# Patient Record
Sex: Male | Born: 1935 | Race: White | Hispanic: No | Smoking: Former smoker
Health system: Southern US, Community
[De-identification: ages and names within clinical notes are randomized; demographics above are authoritative.]

## PROBLEM LIST (undated history)

## (undated) DIAGNOSIS — G4730 Sleep apnea, unspecified: Secondary | ICD-10-CM

## (undated) DIAGNOSIS — E669 Obesity, unspecified: Secondary | ICD-10-CM

## (undated) DIAGNOSIS — Z8619 Personal history of other infectious and parasitic diseases: Secondary | ICD-10-CM

## (undated) DIAGNOSIS — I10 Essential (primary) hypertension: Secondary | ICD-10-CM

## (undated) DIAGNOSIS — N289 Disorder of kidney and ureter, unspecified: Secondary | ICD-10-CM

## (undated) DIAGNOSIS — J189 Pneumonia, unspecified organism: Secondary | ICD-10-CM

## (undated) DIAGNOSIS — F419 Anxiety disorder, unspecified: Secondary | ICD-10-CM

## (undated) DIAGNOSIS — I714 Abdominal aortic aneurysm, without rupture, unspecified: Secondary | ICD-10-CM

## (undated) DIAGNOSIS — R51 Headache: Secondary | ICD-10-CM

## (undated) DIAGNOSIS — H269 Unspecified cataract: Secondary | ICD-10-CM

## (undated) DIAGNOSIS — Z8489 Family history of other specified conditions: Secondary | ICD-10-CM

## (undated) DIAGNOSIS — D62 Acute posthemorrhagic anemia: Secondary | ICD-10-CM

## (undated) DIAGNOSIS — I4891 Unspecified atrial fibrillation: Secondary | ICD-10-CM

## (undated) DIAGNOSIS — K219 Gastro-esophageal reflux disease without esophagitis: Secondary | ICD-10-CM

## (undated) DIAGNOSIS — I209 Angina pectoris, unspecified: Secondary | ICD-10-CM

## (undated) DIAGNOSIS — F40240 Claustrophobia: Secondary | ICD-10-CM

## (undated) DIAGNOSIS — IMO0002 Reserved for concepts with insufficient information to code with codable children: Secondary | ICD-10-CM

## (undated) HISTORY — PX: EYE SURGERY: SHX253

## (undated) HISTORY — PX: CATARACT EXTRACTION W/ INTRAOCULAR LENS IMPLANT: SHX1309

## (undated) HISTORY — PX: SHOULDER SURGERY: SHX246

## (undated) HISTORY — PX: BACK SURGERY: SHX140

---

## 2002-06-17 ENCOUNTER — Encounter: Payer: Self-pay | Admitting: Family Medicine

## 2002-06-17 ENCOUNTER — Encounter: Admission: RE | Admit: 2002-06-17 | Discharge: 2002-06-17 | Payer: Self-pay | Admitting: Family Medicine

## 2005-01-19 ENCOUNTER — Encounter: Admission: RE | Admit: 2005-01-19 | Discharge: 2005-01-19 | Payer: Self-pay | Admitting: Orthopedic Surgery

## 2005-01-20 ENCOUNTER — Ambulatory Visit (HOSPITAL_COMMUNITY): Admission: RE | Admit: 2005-01-20 | Discharge: 2005-01-20 | Payer: Self-pay | Admitting: Orthopedic Surgery

## 2005-01-20 ENCOUNTER — Ambulatory Visit (HOSPITAL_BASED_OUTPATIENT_CLINIC_OR_DEPARTMENT_OTHER): Admission: RE | Admit: 2005-01-20 | Discharge: 2005-01-20 | Payer: Self-pay | Admitting: Orthopedic Surgery

## 2005-01-31 ENCOUNTER — Encounter: Admission: RE | Admit: 2005-01-31 | Discharge: 2005-02-24 | Payer: Self-pay | Admitting: Orthopedic Surgery

## 2012-06-30 ENCOUNTER — Encounter (HOSPITAL_COMMUNITY): Payer: Self-pay | Admitting: *Deleted

## 2012-06-30 ENCOUNTER — Emergency Department (HOSPITAL_COMMUNITY)
Admission: EM | Admit: 2012-06-30 | Discharge: 2012-06-30 | Disposition: A | Discharge status: Home / Self Care | Payer: Medicare Other | Attending: Emergency Medicine | Admitting: Emergency Medicine

## 2012-06-30 DIAGNOSIS — K219 Gastro-esophageal reflux disease without esophagitis: Secondary | ICD-10-CM | POA: Insufficient documentation

## 2012-06-30 DIAGNOSIS — M545 Low back pain, unspecified: Secondary | ICD-10-CM | POA: Insufficient documentation

## 2012-06-30 DIAGNOSIS — I10 Essential (primary) hypertension: Secondary | ICD-10-CM | POA: Insufficient documentation

## 2012-06-30 DIAGNOSIS — F411 Generalized anxiety disorder: Secondary | ICD-10-CM | POA: Insufficient documentation

## 2012-06-30 DIAGNOSIS — M549 Dorsalgia, unspecified: Secondary | ICD-10-CM

## 2012-06-30 DIAGNOSIS — M79609 Pain in unspecified limb: Secondary | ICD-10-CM | POA: Insufficient documentation

## 2012-06-30 HISTORY — DX: Anxiety disorder, unspecified: F41.9

## 2012-06-30 HISTORY — DX: Gastro-esophageal reflux disease without esophagitis: K21.9

## 2012-06-30 HISTORY — DX: Essential (primary) hypertension: I10

## 2012-06-30 LAB — URINALYSIS, ROUTINE W REFLEX MICROSCOPIC
Glucose, UA: NEGATIVE mg/dL
Leukocytes, UA: NEGATIVE
Protein, ur: NEGATIVE mg/dL
Specific Gravity, Urine: 1.025 (ref 1.005–1.030)
pH: 7 (ref 5.0–8.0)

## 2012-06-30 LAB — BASIC METABOLIC PANEL
CO2: 31 mEq/L (ref 19–32)
Calcium: 8.5 mg/dL (ref 8.4–10.5)
Potassium: 3.9 mEq/L (ref 3.5–5.1)
Sodium: 138 mEq/L (ref 135–145)

## 2012-06-30 MED ORDER — OXYCODONE-ACETAMINOPHEN 5-325 MG PO TABS: ORAL_TABLET | ORAL | Status: DC

## 2012-06-30 MED ORDER — HYDROMORPHONE HCL PF 1 MG/ML IJ SOLN
1.0000 mg | Freq: Once | INTRAMUSCULAR | Status: AC
  Administered 2012-06-30: 1 mg via INTRAVENOUS
  Filled 2012-06-30: qty 1

## 2012-06-30 MED ORDER — DIAZEPAM 5 MG PO TABS
5.0000 mg | ORAL_TABLET | Freq: Once | ORAL | Status: AC
  Administered 2012-06-30: 5 mg via ORAL
  Filled 2012-06-30: qty 1

## 2012-06-30 MED ORDER — OXYCODONE-ACETAMINOPHEN 5-325 MG PO TABS
2.0000 | ORAL_TABLET | Freq: Once | ORAL | Status: AC
  Administered 2012-06-30: 2 via ORAL
  Filled 2012-06-30: qty 2

## 2012-06-30 MED ORDER — DIAZEPAM 5 MG PO TABS: ORAL_TABLET | ORAL | Status: DC

## 2012-06-30 MED ORDER — ONDANSETRON HCL 4 MG/2ML IJ SOLN
4.0000 mg | Freq: Once | INTRAMUSCULAR | Status: AC
  Administered 2012-06-30: 4 mg via INTRAVENOUS
  Filled 2012-06-30: qty 2

## 2012-06-30 MED ORDER — CYCLOBENZAPRINE HCL 10 MG PO TABS
10.0000 mg | ORAL_TABLET | Freq: Once | ORAL | Status: AC
  Administered 2012-06-30: 10 mg via ORAL
  Filled 2012-06-30: qty 1

## 2012-06-30 MED ORDER — MORPHINE SULFATE 4 MG/ML IJ SOLN
4.0000 mg | Freq: Once | INTRAMUSCULAR | Status: AC
  Administered 2012-06-30: 4 mg via INTRAVENOUS
  Filled 2012-06-30: qty 1

## 2012-06-30 NOTE — ED Notes (Signed)
ZOX:[id number]<AV> Expected date:06/30/12<BR> Expected time: 8:59 AM<BR> Means of arrival:Ambulance<BR> Comments:<BR> Back Pain

## 2012-06-30 NOTE — ED Notes (Signed)
RN to obtain labs with start of IV 

## 2012-06-30 NOTE — Discharge Instructions (Signed)
Back Pain, Adult Low back pain is very common. About 1 in 5 people have back pain.The cause of low back pain is rarely dangerous. The pain often gets better over time.About half of people with a sudden onset of back pain feel better in just 2 weeks. About 8 in 10 people feel better by 6 weeks.  CAUSES Some common causes of back pain include:  Strain of the muscles or ligaments supporting the spine.   Wear and tear (degeneration) of the spinal discs.   Arthritis.   Direct injury to the back.  DIAGNOSIS Most of the time, the direct cause of low back pain is not known.However, back pain can be treated effectively even when the exact cause of the pain is unknown.Answering your caregiver's questions about your overall health and symptoms is one of the most accurate ways to make sure the cause of your pain is not dangerous. If your caregiver needs more information, he or she may order lab work or imaging tests (X-rays or MRIs).However, even if imaging tests show changes in your back, this usually does not require surgery. HOME CARE INSTRUCTIONS For many people, back pain returns.Since low back pain is rarely dangerous, it is often a condition that people can learn to manageon their own.   Remain active. It is stressful on the back to sit or stand in one place. Do not sit, drive, or stand in one place for more than 30 minutes at a time. Take short walks on level surfaces as soon as pain allows.Try to increase the length of time you walk each day.   Do not stay in bed.Resting more than 1 or 2 days can delay your recovery.   Do not avoid exercise or work.Your body is made to move.It is not dangerous to be active, even though your back may hurt.Your back will likely heal faster if you return to being active before your pain is gone.   Pay attention to your body when you bend and lift. Many people have less discomfortwhen lifting if they bend their knees, keep the load close to their  bodies,and avoid twisting. Often, the most comfortable positions are those that put less stress on your recovering back.   Find a comfortable position to sleep. Use a firm mattress and lie on your side with your knees slightly bent. If you lie on your back, put a pillow under your knees.   Only take over-the-counter or prescription medicines as directed by your caregiver. Over-the-counter medicines to reduce pain and inflammation are often the most helpful.Your caregiver may prescribe muscle relaxant drugs.These medicines help dull your pain so you can more quickly return to your normal activities and healthy exercise.   Put ice on the injured area.   Put ice in a plastic bag.   Place a towel between your skin and the bag.   Leave the ice on for 15 to 20 minutes, 3 to 4 times a day for the first 2 to 3 days. After that, ice and heat may be alternated to reduce pain and spasms.   Ask your caregiver about trying back exercises and gentle massage. This may be of some benefit.   Avoid feeling anxious or stressed.Stress increases muscle tension and can worsen back pain.It is important to recognize when you are anxious or stressed and learn ways to manage it.Exercise is a great option.  SEEK MEDICAL CARE IF:  You have pain that is not relieved with rest or medicine.   You have   pain that does not improve in 1 week.   You have new symptoms.   You are generally not feeling well.  SEEK IMMEDIATE MEDICAL CARE IF:   You have pain that radiates from your back into your legs.   You develop new bowel or bladder control problems.   You have unusual weakness or numbness in your arms or legs.   You develop nausea or vomiting.   You develop abdominal pain.   You feel faint.  Document Released: 11/21/2005 Document Revised: 11/10/2011 Document Reviewed: 04/11/2011 [employer] Patient Information 2012 [employer]. 

## 2012-06-30 NOTE — ED Notes (Signed)
Patient brought by [facility] for lower back pain radiating to his left leg since Thursday

## 2012-07-01 NOTE — ED Provider Notes (Signed)
History     CSN: [id number]  Arrival date & time 06/30/12  0905   First MD Initiated Contact with Patient 06/30/12 [phone]      Chief Complaint  Patient presents with  . Back Pain    (Consider location/radiation/quality/duration/timing/severity/associated sxs/prior treatment) HPI Patient is a 78 year old male who presents today complaining of left lower back pain radiating all the way down the lateral side of his left leg to his foot. Patient required EMS transport his family could not move him due to his pain. Patient has been seen by his primary care physician for this. Pain began 4 days ago after the patient had played golf and then mowed on a riding mower all day. When he was seen by his primary care physician a plain film was performed and L5-S1 arthropathy was noted. Performance of an MRI was discussed but has not yet been scheduled. Patient was sent home with Vicodin but reports that this is then nothing for his pain. Additionally his pain has worsened. Patient has not had any weakness, numbness, or incontinence. He's had no new injuries and denies any urinary symptoms, nausea, vomiting, or fevers. Patient reports that the pain is sharp and a 10 out of 10. He has no history of cancer or heavy steroid use to suggest pathologic fracture. Additionally plain films are even performed. There are no other associated or modifying factors. Past Medical History  Diagnosis Date  . Hypertension   . Anxiety   . Acid reflux     History reviewed. No pertinent past surgical history.  History reviewed. No pertinent family history.  History  Substance Use Topics  . Smoking status: Never Smoker   . Smokeless tobacco: Not on file  . Alcohol Use: No      Review of Systems  Constitutional: Negative.   HENT: Negative.   Eyes: Negative.   Respiratory: Negative.   Cardiovascular: Negative.   Gastrointestinal: Negative.   Genitourinary: Negative.   Musculoskeletal: Positive for back pain.    Skin: Negative.   Neurological: Negative.   Hematological: Negative.   Psychiatric/Behavioral: Negative.   All other systems reviewed and are negative.    Allergies  Lisinopril  Home Medications   Current Outpatient Rx  Name Route Sig Dispense Refill  . ALPRAZOLAM 0.25 MG PO TABS Oral Take 0.25 mg by mouth 3 (three) times daily as needed. For anxiety    . AMLODIPINE BESYLATE 10 MG PO TABS Oral Take 10 mg by mouth daily.    Marland Kitchen VITAMIN C 1000 MG PO TABS Oral Take 1,000 mg by mouth daily.    . ASPIRIN EC 81 MG PO TBEC Oral Take 81 mg by mouth daily.    Marland Kitchen CETIRIZINE HCL 10 MG PO TABS Oral Take 10 mg by mouth daily.    Marland Kitchen VITAMIN D 1000 UNITS PO TABS Oral Take 2,000 Units by mouth daily.    Marland Kitchen ESOMEPRAZOLE MAGNESIUM 40 MG PO CPDR Oral Take 40 mg by mouth daily before breakfast.    . IBUPROFEN 100 MG PO TABS Oral Take 400 mg by mouth every 6 (six) hours as needed.    . IRBESARTAN-HYDROCHLOROTHIAZIDE 300-12.5 MG PO TABS Oral Take 1 tablet by mouth daily.    . NEBIVOLOL HCL 10 MG PO TABS Oral Take 10 mg by mouth daily.    Marland Kitchen PRESCRIPTION MEDICATION Nasal Place 2 sprays into the nose daily. Nasal spray    . DIAZEPAM 5 MG PO TABS  Take take 1-2 tablets by mouth every 8  hours when necessary for back spasm. 30 tablet 0  . OXYCODONE-ACETAMINOPHEN 5-325 MG PO TABS  Take 1-2 tablets by mouth every 4 hours as needed for back pain. 30 tablet 0    BP 152/71  Pulse 86  Temp 98.5 F (36.9 C) (Oral)  Resp 16  SpO2 95%  Physical Exam  Nursing note and vitals reviewed. GEN: Well-developed, well-nourished, elderly male in no distress HEENT: Atraumatic, normocephalic. Oropharynx clear without erythema EYES: PERRLA BL, no scleral icterus. NECK: Trachea midline, no meningismus CV: regular rate and rhythm. No murmurs, rubs, or gallops PULM: No respiratory distress.  No crackles, wheezes, or rales. GI: soft, non-tender. No guarding, rebound, or tenderness. + bowel sounds  GU: deferred Neuro: cranial  nerves grossly 2-12 intact, no abnormalities of strength or sensation, A and O x 3 MSK: Tenderness to palpation over the left sacroiliac area as well as the left paraspinal musculature. Pain is also noted by patient indication along the lateral side of the left leg into the left knee extending all the way down to the foot on the lateral portion of the leg. This is not reproducible on palpation. Knee has full range of motion with no ligamentous instability on the left. Left hip has full range of motion with no deformity or pain with or oral or manipulation. No numbness is noted over the extremity. Patient has 4/5 strength in the left lower shiny which is a function of pain as only certain motions are limited. Skin: No rashes petechiae, purpura, or jaundice Psych: no abnormality of mood   ED Course  Procedures (including critical care time)  Labs Reviewed  URINALYSIS, ROUTINE W REFLEX MICROSCOPIC - Abnormal; Notable for the following:    APPearance CLOUDY (*)     All other components within normal limits  BASIC METABOLIC PANEL - Abnormal; Notable for the following:    GFR calc non Af Amer 81 (*)     All other components within normal limits  LAB REPORT - SCANNED   No results found.   1. Back pain       MDM  Patient was evaluated by myself. Based on evaluation I did perform urinalysis as well as renal function to ensure that patient was not having any renal pathology relating to his pain. History was consistent with musculoskeletal strain as well as possible degenerative disease. I reviewed the report from outside imaging. This did indicate L5-S1 facet arthropathy. Patient was treated symptomatically here. Closed MRI was all that was available and patient reported being horribly claustrophobic. He had no emergent symptoms that would suggest an MRI must be performed today and patient preferred outpatient MRI. This was scheduled at the earliest available appointment which is Thursday, August 2.  Patient is to present at 8:15 for a scan scheduled for 8:45 PM. Patient was hypertensive upon arrival but had been told not to take any of his home medications. Following administration of his home medications as well as his first dose of medication blood pressure improved. Additionally family reports his blood pressure is always quite high. Patient required significant amounts of medication to control his symptoms. Eventually patient's pain was reasonably controlled with Percocet and Valium. Family and patient were both notified of fall risk with these medications. Patient was given a prescription for a walker to assist him with steadiness at this time. Family does not plan to leave the patient by himself at all during this period such strong medications would not have been utilized if patient had had  any degree of reasonable pain control with less potent medications. Patient has been seen by [facility] in the distant past for other issues. He was given referral information to both Dr. Newell Coral who is on call today as well as [facility] if patient prefers to have followup. Patient was discharged in good condition. This was after a lengthy discussion with family regarding ability for patient to function at home. They understood that if there were problems they can return to the emergency department for possible admission if outpatient management was unsuccessful. They were comfortable with plan and patient was discharged in the care of his family at home.        Cyndra Numbers, MD 07/01/12 [phone]

## 2012-07-02 ENCOUNTER — Ambulatory Visit
Admission: RE | Admit: 2012-07-02 | Discharge: 2012-07-02 | Disposition: A | Discharge status: Home / Self Care | Payer: Medicare Other | Source: Ambulatory Visit | Attending: Emergency Medicine | Admitting: Emergency Medicine

## 2012-07-02 ENCOUNTER — Other Ambulatory Visit: Payer: Self-pay | Admitting: Internal Medicine

## 2012-07-02 ENCOUNTER — Other Ambulatory Visit: Payer: Self-pay | Admitting: *Deleted

## 2012-07-02 ENCOUNTER — Other Ambulatory Visit: Payer: Self-pay | Admitting: Emergency Medicine

## 2012-07-02 DIAGNOSIS — Z77018 Contact with and (suspected) exposure to other hazardous metals: Secondary | ICD-10-CM

## 2012-07-02 DIAGNOSIS — M5430 Sciatica, unspecified side: Secondary | ICD-10-CM

## 2012-07-05 ENCOUNTER — Other Ambulatory Visit: Payer: Medicare Other

## 2012-07-13 ENCOUNTER — Other Ambulatory Visit: Payer: Self-pay | Admitting: Neurosurgery

## 2012-07-13 DIAGNOSIS — M545 Low back pain: Secondary | ICD-10-CM

## 2012-07-17 ENCOUNTER — Ambulatory Visit
Admission: RE | Admit: 2012-07-17 | Discharge: 2012-07-17 | Disposition: A | Discharge status: Home / Self Care | Payer: Medicare Other | Source: Ambulatory Visit | Attending: Neurosurgery | Admitting: Neurosurgery

## 2012-07-17 VITALS — BP 130/67 | HR 76

## 2012-07-17 DIAGNOSIS — M545 Low back pain: Secondary | ICD-10-CM

## 2012-07-17 MED ORDER — IOHEXOL 180 MG/ML  SOLN
15.0000 mL | Freq: Once | INTRAMUSCULAR | Status: AC | PRN
  Administered 2012-07-17: 15 mL via INTRATHECAL

## 2012-07-17 MED ORDER — HYDROMORPHONE HCL PF 2 MG/ML IJ SOLN
2.0000 mg | Freq: Once | INTRAMUSCULAR | Status: AC
  Administered 2012-07-17: 2 mg via INTRAMUSCULAR

## 2012-07-17 MED ORDER — ONDANSETRON HCL 4 MG/2ML IJ SOLN
4.0000 mg | Freq: Once | INTRAMUSCULAR | Status: AC
  Administered 2012-07-17: 4 mg via INTRAMUSCULAR

## 2012-07-17 MED ORDER — DIAZEPAM 5 MG PO TABS
5.0000 mg | ORAL_TABLET | Freq: Once | ORAL | Status: AC
  Administered 2012-07-17: 5 mg via ORAL

## 2012-07-17 NOTE — Progress Notes (Signed)
Pt c/o of left leg pain post standing x-rays and pain meds given.

## 2012-07-18 ENCOUNTER — Other Ambulatory Visit: Payer: Self-pay | Admitting: Neurosurgery

## 2012-07-18 ENCOUNTER — Encounter (HOSPITAL_COMMUNITY): Payer: Self-pay | Admitting: Pharmacy Technician

## 2012-07-19 ENCOUNTER — Ambulatory Visit (HOSPITAL_COMMUNITY)
Admission: RE | Admit: 2012-07-19 | Discharge: 2012-07-19 | Disposition: A | Discharge status: Home / Self Care | Payer: Medicare Other | Source: Ambulatory Visit | Attending: Neurosurgery | Admitting: Neurosurgery

## 2012-07-19 ENCOUNTER — Encounter (HOSPITAL_COMMUNITY)
Admission: RE | Admit: 2012-07-19 | Discharge: 2012-07-19 | Disposition: A | Discharge status: Home / Self Care | Payer: Medicare Other | Source: Ambulatory Visit | Attending: Neurosurgery | Admitting: Neurosurgery

## 2012-07-19 ENCOUNTER — Encounter (HOSPITAL_COMMUNITY): Payer: Self-pay

## 2012-07-19 ENCOUNTER — Other Ambulatory Visit (HOSPITAL_COMMUNITY): Payer: Self-pay | Admitting: Neurosurgery

## 2012-07-19 DIAGNOSIS — I10 Essential (primary) hypertension: Secondary | ICD-10-CM | POA: Insufficient documentation

## 2012-07-19 DIAGNOSIS — R634 Abnormal weight loss: Secondary | ICD-10-CM | POA: Insufficient documentation

## 2012-07-19 DIAGNOSIS — Z01818 Encounter for other preprocedural examination: Secondary | ICD-10-CM

## 2012-07-19 DIAGNOSIS — Z01812 Encounter for preprocedural laboratory examination: Secondary | ICD-10-CM | POA: Insufficient documentation

## 2012-07-19 HISTORY — DX: Pneumonia, unspecified organism: J18.9

## 2012-07-19 HISTORY — DX: Abdominal aortic aneurysm, without rupture, unspecified: I71.40

## 2012-07-19 HISTORY — DX: Claustrophobia: F40.240

## 2012-07-19 HISTORY — DX: Reserved for concepts with insufficient information to code with codable children: IMO0002

## 2012-07-19 HISTORY — DX: Personal history of other infectious and parasitic diseases: Z86.19

## 2012-07-19 HISTORY — DX: Headache: R51

## 2012-07-19 HISTORY — DX: Family history of other specified conditions: Z84.89

## 2012-07-19 HISTORY — DX: Unspecified cataract: H26.9

## 2012-07-19 HISTORY — DX: Abdominal aortic aneurysm, without rupture: I71.4

## 2012-07-19 HISTORY — DX: Sleep apnea, unspecified: G47.30

## 2012-07-19 LAB — CBC
MCV: 87.3 fL (ref 78.0–100.0)
Platelets: 328 10*3/uL (ref 150–400)
RBC: 4.8 MIL/uL (ref 4.22–5.81)
RDW: 12.7 % (ref 11.5–15.5)
WBC: 6.6 10*3/uL (ref 4.0–10.5)

## 2012-07-19 LAB — BASIC METABOLIC PANEL
Calcium: 9.1 mg/dL (ref 8.4–10.5)
Creatinine, Ser: 0.99 mg/dL (ref 0.50–1.35)
GFR calc Af Amer: 90 mL/min — ABNORMAL LOW (ref >90)
GFR calc non Af Amer: 78 mL/min — ABNORMAL LOW (ref >90)
Sodium: 137 mEq/L (ref 135–145)

## 2012-07-19 LAB — SURGICAL PCR SCREEN: MRSA, PCR: NEGATIVE

## 2012-07-19 MED ORDER — CEFAZOLIN SODIUM-DEXTROSE 2-3 GM-% IV SOLR
2.0000 g | INTRAVENOUS | Status: AC
  Administered 2012-07-20: 2 g via INTRAVENOUS
  Filled 2012-07-19: qty 50

## 2012-07-19 MED ORDER — DEXAMETHASONE SODIUM PHOSPHATE 10 MG/ML IJ SOLN
10.0000 mg | INTRAMUSCULAR | Status: DC
  Filled 2012-07-19: qty 1

## 2012-07-19 NOTE — Progress Notes (Signed)
Contacted Dr. Sampson Goon regarding EKG from 12/01/11.  Reviewed results.  EKG ok per Dr. Sampson Goon

## 2012-07-19 NOTE — Progress Notes (Signed)
Contacted Dr. Dimas Millin office [phone], spoke with [facility], requested most recent EKG and CXR.

## 2012-07-19 NOTE — Pre-Procedure Instructions (Signed)
22 Antonio Bernard  07/19/2012   Your procedure is scheduled on:  Friday July 20, 2012  Report to [facility] at 10:00 AM.  Call this number if you have problems the morning of surgery: [phone]   Remember:   Do not eat food or drinkAfter Midnight.      Take these medicines the morning of surgery with A SIP OF WATER: xanax, amlodipine, nexium, flonase, bystolic, percocet   Do not wear jewelry, make-up or nail polish.  Do not wear lotions, powders, or perfumes. You may wear deodorant.  Do not shave 48 hours prior to surgery. Men may shave face and neck.  Do not bring valuables to the hospital.  Contacts, dentures or bridgework may not be worn into surgery.  Leave suitcase in the car. After surgery it may be brought to your room.  For patients admitted to the hospital, checkout time is 11:00 AM the day of discharge.   Patients discharged the day of surgery will not be allowed to drive home.  Name and phone number of your driver: Marlou Starks [phone]  Special Instructions: CHG Shower Use Special Wash: 1/2 bottle night before surgery and 1/2 bottle morning of surgery.   Please read over the following fact sheets that you were given: Pain Booklet, Coughing and Deep Breathing, MRSA Information and Surgical Site Infection Prevention

## 2012-07-20 ENCOUNTER — Ambulatory Visit (HOSPITAL_COMMUNITY)
Admission: RE | Admit: 2012-07-20 | Discharge: 2012-07-21 | Disposition: A | Discharge status: Home / Self Care | Payer: Medicare Other | Source: Ambulatory Visit | Attending: Neurosurgery | Admitting: Neurosurgery

## 2012-07-20 ENCOUNTER — Encounter (HOSPITAL_COMMUNITY)
Admission: RE | Disposition: A | Discharge status: Home / Self Care | Payer: Self-pay | Source: Ambulatory Visit | Attending: Neurosurgery

## 2012-07-20 ENCOUNTER — Inpatient Hospital Stay (HOSPITAL_COMMUNITY): Payer: Medicare Other | Admitting: Anesthesiology

## 2012-07-20 ENCOUNTER — Encounter (HOSPITAL_COMMUNITY): Payer: Self-pay | Admitting: Anesthesiology

## 2012-07-20 ENCOUNTER — Inpatient Hospital Stay (HOSPITAL_COMMUNITY): Payer: Medicare Other

## 2012-07-20 ENCOUNTER — Encounter (HOSPITAL_COMMUNITY): Payer: Self-pay | Admitting: *Deleted

## 2012-07-20 ENCOUNTER — Encounter (HOSPITAL_COMMUNITY): Payer: Self-pay | Admitting: Vascular Surgery

## 2012-07-20 DIAGNOSIS — M5116 Intervertebral disc disorders with radiculopathy, lumbar region: Secondary | ICD-10-CM

## 2012-07-20 DIAGNOSIS — Z01818 Encounter for other preprocedural examination: Secondary | ICD-10-CM | POA: Insufficient documentation

## 2012-07-20 DIAGNOSIS — F411 Generalized anxiety disorder: Secondary | ICD-10-CM | POA: Insufficient documentation

## 2012-07-20 DIAGNOSIS — Z01812 Encounter for preprocedural laboratory examination: Secondary | ICD-10-CM | POA: Insufficient documentation

## 2012-07-20 DIAGNOSIS — I10 Essential (primary) hypertension: Secondary | ICD-10-CM | POA: Insufficient documentation

## 2012-07-20 DIAGNOSIS — M5126 Other intervertebral disc displacement, lumbar region: Principal | ICD-10-CM | POA: Insufficient documentation

## 2012-07-20 DIAGNOSIS — G4730 Sleep apnea, unspecified: Secondary | ICD-10-CM | POA: Insufficient documentation

## 2012-07-20 DIAGNOSIS — J701 Chronic and other pulmonary manifestations due to radiation: Secondary | ICD-10-CM | POA: Insufficient documentation

## 2012-07-20 HISTORY — PX: LUMBAR LAMINECTOMY/DECOMPRESSION MICRODISCECTOMY: SHX5026

## 2012-07-20 SURGERY — LUMBAR LAMINECTOMY/DECOMPRESSION MICRODISCECTOMY 1 LEVEL
Anesthesia: General | Site: Back | Wound class: Clean

## 2012-07-20 MED ORDER — DEXTROSE 5 % IV SOLN
INTRAVENOUS | Status: DC | PRN
  Administered 2012-07-20: 11:00:00 via INTRAVENOUS

## 2012-07-20 MED ORDER — CEFAZOLIN SODIUM 1-5 GM-% IV SOLN
1.0000 g | Freq: Three times a day (TID) | INTRAVENOUS | Status: AC
  Administered 2012-07-20 – 2012-07-21 (×2): 1 g via INTRAVENOUS
  Filled 2012-07-20 (×2): qty 50

## 2012-07-20 MED ORDER — PROPOFOL 10 MG/ML IV EMUL
INTRAVENOUS | Status: DC | PRN
  Administered 2012-07-20: 200 mg via INTRAVENOUS

## 2012-07-20 MED ORDER — PHENOL 1.4 % MT LIQD: 1.0000 | OROMUCOSAL | Status: DC | PRN

## 2012-07-20 MED ORDER — ALPRAZOLAM 0.5 MG PO TABS
0.5000 mg | ORAL_TABLET | Freq: Every evening | ORAL | Status: DC | PRN
  Administered 2012-07-20: 0.5 mg via ORAL
  Filled 2012-07-20: qty 1

## 2012-07-20 MED ORDER — THROMBIN 5000 UNITS EX KIT
PACK | CUTANEOUS | Status: DC | PRN
  Administered 2012-07-20 (×2): 5000 [IU] via TOPICAL

## 2012-07-20 MED ORDER — KETOROLAC TROMETHAMINE 15 MG/ML IJ SOLN
INTRAMUSCULAR | Status: DC | PRN
  Administered 2012-07-20: 15 mg via INTRAVENOUS

## 2012-07-20 MED ORDER — ONDANSETRON HCL 4 MG/2ML IJ SOLN: 4.0000 mg | Freq: Once | INTRAMUSCULAR | Status: DC | PRN

## 2012-07-20 MED ORDER — DEXAMETHASONE SODIUM PHOSPHATE 4 MG/ML IJ SOLN
INTRAMUSCULAR | Status: DC | PRN
  Administered 2012-07-20: 12 mg via INTRAVENOUS

## 2012-07-20 MED ORDER — CYCLOBENZAPRINE HCL 10 MG PO TABS
10.0000 mg | ORAL_TABLET | Freq: Three times a day (TID) | ORAL | Status: DC | PRN
  Administered 2012-07-21: 10 mg via ORAL
  Filled 2012-07-20: qty 1

## 2012-07-20 MED ORDER — IRBESARTAN-HYDROCHLOROTHIAZIDE 300-12.5 MG PO TABS: 1.0000 | ORAL_TABLET | Freq: Every day | ORAL | Status: DC

## 2012-07-20 MED ORDER — ALPRAZOLAM 0.25 MG PO TABS: 0.2500 mg | ORAL_TABLET | Freq: Three times a day (TID) | ORAL | Status: DC | PRN

## 2012-07-20 MED ORDER — BUPIVACAINE HCL (PF) 0.5 % IJ SOLN
INTRAMUSCULAR | Status: DC | PRN
  Administered 2012-07-20: 20 mL

## 2012-07-20 MED ORDER — ACETAMINOPHEN 325 MG PO TABS: 650.0000 mg | ORAL_TABLET | ORAL | Status: DC | PRN

## 2012-07-20 MED ORDER — HYDROCHLOROTHIAZIDE 12.5 MG PO CAPS
12.5000 mg | ORAL_CAPSULE | Freq: Every day | ORAL | Status: DC
  Filled 2012-07-20 (×2): qty 1

## 2012-07-20 MED ORDER — HYDROMORPHONE HCL PF 1 MG/ML IJ SOLN
INTRAMUSCULAR | Status: AC
  Filled 2012-07-20: qty 1

## 2012-07-20 MED ORDER — SODIUM CHLORIDE 0.9 % IJ SOLN
3.0000 mL | Freq: Two times a day (BID) | INTRAMUSCULAR | Status: DC
  Administered 2012-07-20: 3 mL via INTRAVENOUS

## 2012-07-20 MED ORDER — DEXAMETHASONE 4 MG PO TABS
4.0000 mg | ORAL_TABLET | Freq: Four times a day (QID) | ORAL | Status: AC
  Administered 2012-07-20: 4 mg via ORAL
  Filled 2012-07-20: qty 1

## 2012-07-20 MED ORDER — ROCURONIUM BROMIDE 100 MG/10ML IV SOLN
INTRAVENOUS | Status: DC | PRN
  Administered 2012-07-20: 50 mg via INTRAVENOUS
  Administered 2012-07-20: 20 mg via INTRAVENOUS

## 2012-07-20 MED ORDER — AMLODIPINE BESYLATE 10 MG PO TABS
10.0000 mg | ORAL_TABLET | Freq: Every day | ORAL | Status: DC
  Filled 2012-07-20: qty 1

## 2012-07-20 MED ORDER — ONDANSETRON HCL 4 MG/2ML IJ SOLN
INTRAMUSCULAR | Status: DC | PRN
  Administered 2012-07-20: 4 mg via INTRAVENOUS

## 2012-07-20 MED ORDER — HYDROMORPHONE HCL PF 1 MG/ML IJ SOLN: 1.0000 mg | INTRAMUSCULAR | Status: DC | PRN

## 2012-07-20 MED ORDER — MUPIROCIN 2 % EX OINT
TOPICAL_OINTMENT | Freq: Once | CUTANEOUS | Status: AC
  Administered 2012-07-20: 10:00:00 via NASAL

## 2012-07-20 MED ORDER — THROMBIN 5000 UNITS EX SOLR: CUTANEOUS | Status: DC | PRN

## 2012-07-20 MED ORDER — DEXAMETHASONE SODIUM PHOSPHATE 4 MG/ML IJ SOLN
4.0000 mg | Freq: Four times a day (QID) | INTRAMUSCULAR | Status: AC
  Administered 2012-07-21: 4 mg via INTRAVENOUS
  Filled 2012-07-20: qty 1

## 2012-07-20 MED ORDER — FENTANYL CITRATE 0.05 MG/ML IJ SOLN
INTRAMUSCULAR | Status: DC | PRN
  Administered 2012-07-20 (×2): 100 ug via INTRAVENOUS
  Administered 2012-07-20: 50 ug via INTRAVENOUS

## 2012-07-20 MED ORDER — ACETAMINOPHEN 10 MG/ML IV SOLN
INTRAVENOUS | Status: DC | PRN
  Administered 2012-07-20: 1000 mg via INTRAVENOUS

## 2012-07-20 MED ORDER — SODIUM CHLORIDE 0.9 % IR SOLN
Status: DC | PRN
  Administered 2012-07-20: 12:00:00

## 2012-07-20 MED ORDER — SODIUM CHLORIDE 0.9 % IJ SOLN: 3.0000 mL | INTRAMUSCULAR | Status: DC | PRN

## 2012-07-20 MED ORDER — HYDROCODONE-ACETAMINOPHEN 5-325 MG PO TABS
1.0000 | ORAL_TABLET | ORAL | Status: DC | PRN
  Administered 2012-07-20 – 2012-07-21 (×4): 1 via ORAL
  Filled 2012-07-20 (×4): qty 1

## 2012-07-20 MED ORDER — LACTATED RINGERS IV SOLN
INTRAVENOUS | Status: DC | PRN
  Administered 2012-07-20 (×2): via INTRAVENOUS

## 2012-07-20 MED ORDER — EPHEDRINE SULFATE 50 MG/ML IJ SOLN
INTRAMUSCULAR | Status: DC | PRN
  Administered 2012-07-20: 10 mg via INTRAVENOUS

## 2012-07-20 MED ORDER — ACETAMINOPHEN 10 MG/ML IV SOLN
INTRAVENOUS | Status: AC
  Filled 2012-07-20: qty 100

## 2012-07-20 MED ORDER — IRBESARTAN 300 MG PO TABS
300.0000 mg | ORAL_TABLET | Freq: Every day | ORAL | Status: DC
  Filled 2012-07-20 (×2): qty 1

## 2012-07-20 MED ORDER — NEBIVOLOL HCL 10 MG PO TABS
10.0000 mg | ORAL_TABLET | Freq: Every day | ORAL | Status: DC
  Filled 2012-07-20: qty 1

## 2012-07-20 MED ORDER — HEMOSTATIC AGENTS (NO CHARGE) OPTIME
TOPICAL | Status: DC | PRN
  Administered 2012-07-20: 1 via TOPICAL

## 2012-07-20 MED ORDER — ACETAMINOPHEN 650 MG RE SUPP: 650.0000 mg | RECTAL | Status: DC | PRN

## 2012-07-20 MED ORDER — MUPIROCIN 2 % EX OINT
TOPICAL_OINTMENT | CUTANEOUS | Status: AC
  Filled 2012-07-20: qty 22

## 2012-07-20 MED ORDER — GLYCOPYRROLATE 0.2 MG/ML IJ SOLN
INTRAMUSCULAR | Status: DC | PRN
  Administered 2012-07-20: 0.4 mg via INTRAVENOUS

## 2012-07-20 MED ORDER — KETOROLAC TROMETHAMINE 30 MG/ML IJ SOLN
15.0000 mg | Freq: Four times a day (QID) | INTRAMUSCULAR | Status: DC
  Administered 2012-07-20: 19:00:00 via INTRAVENOUS
  Administered 2012-07-21 (×2): 15 mg via INTRAVENOUS
  Filled 2012-07-20 (×7): qty 1

## 2012-07-20 MED ORDER — MIDAZOLAM HCL 5 MG/5ML IJ SOLN
INTRAMUSCULAR | Status: DC | PRN
  Administered 2012-07-20: 1 mg via INTRAVENOUS

## 2012-07-20 MED ORDER — KCL IN DEXTROSE-NACL 20-5-0.45 MEQ/L-%-% IV SOLN
80.0000 mL/h | INTRAVENOUS | Status: DC
  Filled 2012-07-20 (×3): qty 1000

## 2012-07-20 MED ORDER — HYDROMORPHONE HCL PF 1 MG/ML IJ SOLN
0.2500 mg | INTRAMUSCULAR | Status: DC | PRN
  Administered 2012-07-20 (×2): 0.5 mg via INTRAVENOUS

## 2012-07-20 MED ORDER — NEOSTIGMINE METHYLSULFATE 1 MG/ML IJ SOLN
INTRAMUSCULAR | Status: DC | PRN
  Administered 2012-07-20: 3 mg via INTRAVENOUS

## 2012-07-20 MED ORDER — ONDANSETRON HCL 4 MG/2ML IJ SOLN: 4.0000 mg | INTRAMUSCULAR | Status: DC | PRN

## 2012-07-20 MED ORDER — LIDOCAINE HCL (CARDIAC) 20 MG/ML IV SOLN
INTRAVENOUS | Status: DC | PRN
  Administered 2012-07-20: 80 mg via INTRAVENOUS

## 2012-07-20 MED ORDER — MENTHOL 3 MG MT LOZG: 1.0000 | LOZENGE | OROMUCOSAL | Status: DC | PRN

## 2012-07-20 MED ORDER — 0.9 % SODIUM CHLORIDE (POUR BTL) OPTIME
TOPICAL | Status: DC | PRN
  Administered 2012-07-20: 1000 mL

## 2012-07-20 MED ORDER — SODIUM CHLORIDE 0.9 % IV SOLN
INTRAVENOUS | Status: AC
  Filled 2012-07-20: qty 500

## 2012-07-20 MED ORDER — BACITRACIN 50000 UNITS IM SOLR
INTRAMUSCULAR | Status: AC
  Filled 2012-07-20: qty 1

## 2012-07-20 MED ORDER — SODIUM CHLORIDE 0.9 % IV SOLN: 250.0000 mL | INTRAVENOUS | Status: DC

## 2012-07-20 SURGICAL SUPPLY — 49 items
ADH SKN CLS APL DERMABOND .7 (GAUZE/BANDAGES/DRESSINGS) ×1
APL SKNCLS STERI-STRIP NONHPOA (GAUZE/BANDAGES/DRESSINGS) ×2
BAG DECANTER FOR FLEXI CONT (MISCELLANEOUS) ×2 IMPLANT
BENZOIN TINCTURE PRP APPL 2/3 (GAUZE/BANDAGES/DRESSINGS) ×4 IMPLANT
BLADE SURG ROTATE 9660 (MISCELLANEOUS) ×2 IMPLANT
BRUSH SCRUB EZ PLAIN DRY (MISCELLANEOUS) ×2 IMPLANT
CANISTER SUCTION 2500CC (MISCELLANEOUS) ×2 IMPLANT
CLOTH BEACON ORANGE TIMEOUT ST (SAFETY) ×2 IMPLANT
CONT SPEC 4OZ CLIKSEAL STRL BL (MISCELLANEOUS) ×2 IMPLANT
DERMABOND ADVANCED (GAUZE/BANDAGES/DRESSINGS) ×1
DERMABOND ADVANCED .7 DNX12 (GAUZE/BANDAGES/DRESSINGS) ×1 IMPLANT
DRAPE LAPAROTOMY 100X72X124 (DRAPES) ×2 IMPLANT
DRAPE MICROSCOPE ZEISS OPMI (DRAPES) ×2 IMPLANT
DRAPE SURG 17X23 STRL (DRAPES) ×4 IMPLANT
DRESSING TELFA 8X3 (GAUZE/BANDAGES/DRESSINGS) ×2 IMPLANT
ELECT REM PT RETURN 9FT ADLT (ELECTROSURGICAL) ×2
ELECTRODE REM PT RTRN 9FT ADLT (ELECTROSURGICAL) ×1 IMPLANT
GAUZE SPONGE 4X4 16PLY XRAY LF (GAUZE/BANDAGES/DRESSINGS) IMPLANT
GLOVE BIOGEL PI IND STRL 7.0 (GLOVE) IMPLANT
GLOVE BIOGEL PI IND STRL 7.5 (GLOVE) IMPLANT
GLOVE BIOGEL PI INDICATOR 7.0 (GLOVE) ×2
GLOVE BIOGEL PI INDICATOR 7.5 (GLOVE) ×1
GLOVE ECLIPSE 7.5 STRL STRAW (GLOVE) ×2 IMPLANT
GLOVE SS BIOGEL STRL SZ 6.5 (GLOVE) IMPLANT
GLOVE SUPERSENSE BIOGEL SZ 6.5 (GLOVE) ×3
GLOVE SURG SS PI 7.0 STRL IVOR (GLOVE) ×2 IMPLANT
GOWN BRE IMP SLV AUR LG STRL (GOWN DISPOSABLE) IMPLANT
GOWN BRE IMP SLV AUR XL STRL (GOWN DISPOSABLE) ×2 IMPLANT
GOWN STRL REIN 2XL LVL4 (GOWN DISPOSABLE) IMPLANT
KIT BASIN OR (CUSTOM PROCEDURE TRAY) ×2 IMPLANT
KIT ROOM TURNOVER OR (KITS) ×2 IMPLANT
NDL SPNL 22GX3.5 QUINCKE BK (NEEDLE) ×1 IMPLANT
NEEDLE HYPO 22GX1.5 SAFETY (NEEDLE) ×2 IMPLANT
NEEDLE SPNL 22GX3.5 QUINCKE BK (NEEDLE) ×2 IMPLANT
NS IRRIG 1000ML POUR BTL (IV SOLUTION) ×2 IMPLANT
PACK LAMINECTOMY NEURO (CUSTOM PROCEDURE TRAY) ×2 IMPLANT
PAD ARMBOARD 7.5X6 YLW CONV (MISCELLANEOUS) ×6 IMPLANT
PATTIES SURGICAL .75X.75 (GAUZE/BANDAGES/DRESSINGS) ×2 IMPLANT
RUBBERBAND STERILE (MISCELLANEOUS) ×4 IMPLANT
SPONGE GAUZE 4X4 12PLY (GAUZE/BANDAGES/DRESSINGS) ×2 IMPLANT
SPONGE SURGIFOAM ABS GEL SZ50 (HEMOSTASIS) ×2 IMPLANT
STRIP CLOSURE SKIN 1/2X4 (GAUZE/BANDAGES/DRESSINGS) ×2 IMPLANT
SUT VIC AB 2-0 OS6 18 (SUTURE) ×5 IMPLANT
SUT VIC AB 3-0 CP2 18 (SUTURE) ×2 IMPLANT
SYR 20ML ECCENTRIC (SYRINGE) ×2 IMPLANT
TOOL FLUTED BALL 7MM (MISCELLANEOUS) ×2 IMPLANT
TOWEL OR 17X24 6PK STRL BLUE (TOWEL DISPOSABLE) ×2 IMPLANT
TOWEL OR 17X26 10 PK STRL BLUE (TOWEL DISPOSABLE) ×2 IMPLANT
WATER STERILE IRR 1000ML POUR (IV SOLUTION) ×2 IMPLANT

## 2012-07-20 NOTE — Anesthesia Procedure Notes (Signed)
Procedure Name: Intubation Date/Time: 07/20/2012 11:22 AM Performed by: Tyrone Nine Pre-anesthesia Checklist: Suction available, Emergency Drugs available, Timeout performed, Patient being monitored and Patient identified Patient Re-evaluated:Patient Re-evaluated prior to inductionOxygen Delivery Method: Circle system utilized Preoxygenation: Pre-oxygenation with 100% oxygen Intubation Type: IV induction Ventilation: Mask ventilation without difficulty Laryngoscope Size: Mac and 3 Grade View: Grade II Tube type: Oral Tube size: 7.5 mm Secured at: 23 cm Tube secured with: Tape Dental Injury: Teeth and Oropharynx as per pre-operative assessment

## 2012-07-20 NOTE — Op Note (Signed)
Preop diagnosis: Herniated disc L4-5 left Postop diagnosis: Same Procedure: Left L4-5 intralaminar laminotomy for excision of herniated disc with the operative microscope Surgeon: Elnoria Livingston  After being placed the prone position the patient's back was prepped and draped in usual sterile fashion. Localizing x-rays taken prior to incision to identify the appropriate level. Midline incision was made above the spinous processes of L4 and L5. Using Bovie cutting current the incision was carried on the spinous processes. Subperiosteal dissection was then carried out on the left side the spinous processes and lamina and self-retaining tract was placed for exposure. X-ray showed approach the appropriate level. Using the high-speed drill the inferior one third of the L4 lamina the medial one third of the facet joint the superior one half of the L5 lamina were removed. Residual bone and ligamentum flavum removed in a piecemeal fashion. The microscope was draped brought into the field and used for the remainder of the case. Using microdissection technique the lateral aspect of the thecal sac and L5 nerve root were identified. Further coagulation was carried out on 4 canal to identify the L4-5 disc. Inferior to the disc space and just medial to the pedicle a subligamentous fragment of disc was identified. This area was entered and large amounts of disc material were removed that had been wedged date the L5 nerve root. These removed in piecemeal fashion and give excellent decompression. After this or have been thoroughly cleaned out the disc space was incised and thoroughly cleaned out with pituitary rongeurs and curettes. At this time inspection was carried out in all directions for any evidence of residual compression and none could be identified. Large amounts of irrigation were carried out and any bleeding was controlled with upper coagulation and Gelfoam. The was then closed in multiple layers of Vicryl on the muscle  fascia subcutaneous and subcuticular tissues. Dermabond Steri-Strips were placed on the skin. Shortness was then applied the patient was extubated and taken to cut them in stable condition.

## 2012-07-20 NOTE — H&P (Signed)
Antonio Bernard is an 78 y.o. male.   Chief Complaint: Left leg pain HPI: The patient is a 16 old gentleman who recently presented to the office with severe left lower extremity discomfort. He had been tried on conservative therapy prior to that by his primary care Dr. without improvement. He had had an MRI scan that shows some abnormalities but for more definitive diagnosis it was elected to proceed with myelography. The showed a disc herniation at L4-5 on the left and the patient requested surgery due to severe pain. He now comes for a lumbar microdiscectomy. I had a long discussion with him regarding the risks and benefits of surgical intervention. The risks discussed include but are not limited to bleeding infection weakness numbness paralysis spinal fluid leak, and death. We have discussed alternative methods of therapy along with the risks and benefits of nonintervention. He has had the opportunity to ask numerous questions and appears to understand. With this information in hand he has requested that we proceed with surgery.  Past Medical History  Diagnosis Date  . Acid reflux   . Family history of anesthesia complication     daughter had problem waking up  . Pneumonia     hx of  . Sleep apnea     no treatment for, last sleep study over 5 years ago  . Abdominal aortic aneurysm     found 06/29/12 "small"  . Headache   . Herniated vertebral disc     thoracic area  . Cataracts, bilateral     hx of  . H/O Legionnaire's disease     about 24 years ago  . Anxiety     panic attacks   . Claustrophobia     "severe"  . Hypertension     sees  Dr. Ronne Binning [phone]    Past Surgical History  Procedure Date  . Shoulder surgery     left  . Cataract extraction w/ intraocular lens implant     bilateral    History reviewed. No pertinent family history. Social History:  reports that he has quit smoking. He does not have any smokeless tobacco history on file. He reports that he does  not drink alcohol or use illicit drugs.  Allergies:  Allergies  Allergen Reactions  . Demerol (Meperidine) Other (See Comments)    "makes me crazy"  . Lisinopril Diarrhea  . Shellfish Allergy Rash    Medications Prior to Admission  Medication Sig Dispense Refill  . ALPRAZolam (XANAX) 0.25 MG tablet Take 0.25 mg by mouth 3 (three) times daily as needed. For anxiety      . amLODipine (NORVASC) 10 MG tablet Take 10 mg by mouth daily.      . Ascorbic Acid (VITAMIN C) 1000 MG tablet Take 1,000 mg by mouth daily.      Marland Kitchen aspirin EC 81 MG tablet Take 81 mg by mouth daily.      . cetirizine (ZYRTEC) 10 MG tablet Take 10 mg by mouth daily.      . cholecalciferol (VITAMIN D) 1000 UNITS tablet Take 2,000 Units by mouth daily.      Marland Kitchen esomeprazole (NEXIUM) 40 MG capsule Take 40 mg by mouth daily before breakfast.      . fluticasone (FLONASE) 50 MCG/ACT nasal spray Place 2 sprays into the nose daily as needed. For allergies      . irbesartan-hydrochlorothiazide (AVALIDE) 300-12.5 MG per tablet Take 1 tablet by mouth daily.      . nebivolol (BYSTOLIC) 10  MG tablet Take 10 mg by mouth daily.      Marland Kitchen oxyCODONE-acetaminophen (PERCOCET/ROXICET) 5-325 MG per tablet Take 1-2 tablets by mouth every 4 hours as needed for back pain.  30 tablet  0    Results for orders placed during the hospital encounter of 07/19/12 (from the past 48 hour(s))  SURGICAL PCR SCREEN     Status: Abnormal   Collection Time   07/19/12  9:44 AM      Component Value Range Comment   MRSA, PCR NEGATIVE  NEGATIVE    Staphylococcus aureus POSITIVE (*) NEGATIVE   BASIC METABOLIC PANEL     Status: Abnormal   Collection Time   07/19/12 10:21 AM      Component Value Range Comment   Sodium 137  135 - 145 mEq/L    Potassium 3.9  3.5 - 5.1 mEq/L    Chloride 96  96 - 112 mEq/L    CO2 34 (*) 19 - 32 mEq/L    Glucose, Bld 104 (*) 70 - 99 mg/dL    BUN 17  6 - 23 mg/dL    Creatinine, Ser 9.14  0.50 - 1.35 mg/dL    Calcium 9.1  8.4 - 78.2  mg/dL    GFR calc non Af Amer 78 (*) >90 mL/min    GFR calc Af Amer 90 (*) >90 mL/min   CBC     Status: Normal   Collection Time   07/19/12 10:21 AM      Component Value Range Comment   WBC 6.6  4.0 - 10.5 K/uL    RBC 4.80  4.22 - 5.81 MIL/uL    Hemoglobin 14.8  13.0 - 17.0 g/dL    HCT 95.6  21.3 - 08.6 %    MCV 87.3  78.0 - 100.0 fL    MCH 30.8  26.0 - 34.0 pg    MCHC 35.3  30.0 - 36.0 g/dL    RDW 57.8  46.9 - 62.9 %    Platelets 328  150 - 400 K/uL    Dg Chest Port 1 View  07/19/2012  *RADIOLOGY REPORT*  Clinical Data: Hypertension, weight loss  PORTABLE CHEST - 1 VIEW  Comparison: 03/09/2010  Findings: The cardiac shadow is stable.  Persistent elevation of the right hemidiaphragm is noted.  The lungs are clear bilaterally. No acute bony abnormality is noted.  IMPRESSION: No acute abnormality is seen.  Original Report Authenticated By: Phillips Odor, M.D.    Review of systems not obtained due to patient factors.  Blood pressure 181/92, pulse 71, temperature 98.3 F (36.8 C), temperature source Oral, resp. rate 18, SpO2 98.00%.  The patient is awake alert and oriented. He has no facial asymmetry. His gait is markedly antalgic extremely slow diffuse due to severe pain. Reflexes are decreased but equal her strength is slightly decreased extensor pollicis longus on the left Assessment/Plan  impression is that of a herniated disc L4-5 on the left and the plan is for a left L4-5 microdiscectomy.  Reinaldo Meeker, MD 07/20/2012, 10:39 AM

## 2012-07-20 NOTE — Anesthesia Preprocedure Evaluation (Addendum)
Anesthesia Evaluation  Patient identified by MRN, date of birth, ID band Patient awake    Reviewed: Allergy & Precautions, H&P , NPO status , Patient's Chart, lab work & pertinent test results, reviewed documented beta blocker date and time   History of Anesthesia Complications (+) PONV  Airway Mallampati: II TM Distance: >3 FB   Mouth opening: Limited Mouth Opening  Dental  (+) Upper Dentures and Lower Dentures   Pulmonary sleep apnea , pneumonia -, resolved,  Does not wear C-Pap Pt is Claustrophobic   Pulmonary exam normal       Cardiovascular hypertension, Pt. on medications and Pt. on home beta blockers Rhythm:Regular     Neuro/Psych  Headaches, PSYCHIATRIC DISORDERS Anxiety    GI/Hepatic Neg liver ROS, GERD-  Medicated and Controlled,  Endo/Other  negative endocrine ROS  Renal/GU negative Renal ROS     Musculoskeletal  (+) Arthritis -, Osteoarthritis,    Abdominal   Peds  Hematology negative hematology ROS (+)   Anesthesia Other Findings   Reproductive/Obstetrics                          Anesthesia Physical Anesthesia Plan  ASA: III  Anesthesia Plan: General   Post-op Pain Management:    Induction: Intravenous  Airway Management Planned: Oral ETT  Additional Equipment:   Intra-op Plan:   Post-operative Plan: Extubation in OR  Informed Consent: I have reviewed the patients History and Physical, chart, labs and discussed the procedure including the risks, benefits and alternatives for the proposed anesthesia with the patient or authorized representative who has indicated his/her understanding and acceptance.     Plan Discussed with: CRNA, Anesthesiologist and Surgeon  Anesthesia Plan Comments:         Anesthesia Quick Evaluation

## 2012-07-20 NOTE — Preoperative (Signed)
Beta Blockers   Pt Took @ 7:30 am

## 2012-07-20 NOTE — Anesthesia Postprocedure Evaluation (Signed)
  Anesthesia Post-op Note  Patient: Antonio Bernard  Procedure(s) Performed: Procedure(s) (LRB): LUMBAR LAMINECTOMY/DECOMPRESSION MICRODISCECTOMY 1 LEVEL (N/A)  Patient Location: PACU  Anesthesia Type: General  Level of Consciousness: awake, alert , oriented and patient cooperative  Airway and Oxygen Therapy: Patient Spontanous Breathing and Patient connected to nasal cannula oxygen  Post-op Pain: mild  Post-op Assessment: Post-op Vital signs reviewed, Patient's Cardiovascular Status Stable, Respiratory Function Stable, Patent Airway, No signs of Nausea or vomiting and Pain level controlled  Post-op Vital Signs: stable  Complications: No apparent anesthesia complications

## 2012-07-20 NOTE — Transfer of Care (Signed)
Immediate Anesthesia Transfer of Care Note  Patient: Antonio Bernard  Procedure(s) Performed: Procedure(s) (LRB): LUMBAR LAMINECTOMY/DECOMPRESSION MICRODISCECTOMY 1 LEVEL (N/A)  Patient Location: PACU  Anesthesia Type: General  Level of Consciousness: awake, alert , oriented and patient cooperative  Airway & Oxygen Therapy: Patient Spontanous Breathing and Patient connected to nasal cannula oxygen  Post-op Assessment: Report given to PACU RN and Post -op Vital signs reviewed and stable  Post vital signs: Reviewed and stable  Complications: No apparent anesthesia complications

## 2012-07-21 MED ORDER — HYDROCODONE-ACETAMINOPHEN 5-325 MG PO TABS: 1.0000 | ORAL_TABLET | ORAL | Status: AC | PRN

## 2012-07-21 NOTE — Discharge Summary (Signed)
Physician Discharge Summary  Patient ID: Antonio Bernard MRN: [id number] DOB/AGE: 08-28-1933 78 y.o.  Admit date: 07/20/2012 Discharge date: 07/21/2012  Admission Diagnoses:  Discharge Diagnoses:  Active Problems:  * No active hospital problems. *    Discharged Condition: good  Hospital Course: Surgery one day, home the next. Pain markedly improved. Wound fine. Home pod 1, specific inst given.  Consults: None  Significant Diagnostic Studies: none  Treatments: Left L45 microdiscectomy  Discharge Exam: Blood pressure 155/66, pulse 87, temperature 98.6 F (37 C), temperature source Oral, resp. rate 18, SpO2 92.00%. Incision/Wound:healing well  Disposition: 01-Home or Self Care  Discharge Orders    Future Orders Please Complete By Expires   Diet general      Discharge instructions      Comments:   Mostly bedrest. Get up 9 or 10 times each day and walk for 15-20 minutes each time. Very little sitting the first week. No riding in the car until your first post op appointment. If you had neck surgery...may shower from the chest down. If you had low back surgery....you may shower with a saran wrap covering over the incision. Take your pain medicine as needed...and other medicines that you are instructed to take. Call for an appointment...[phone].   Call MD for:  temperature >100.4      Call MD for:  persistant nausea and vomiting      Call MD for:  severe uncontrolled pain      Call MD for:  redness, tenderness, or signs of infection (pain, swelling, redness, odor or green/yellow discharge around incision site)      Call MD for:  difficulty breathing, headache or visual disturbances      Call MD for:  hives        Medication List  As of 07/21/2012  8:48 AM   STOP taking these medications         oxyCODONE-acetaminophen 5-325 MG per tablet         TAKE these medications         ALPRAZolam 0.25 MG tablet   Commonly known as: XANAX   Take 0.25 mg by mouth 3 (three)  times daily as needed. For anxiety      amLODipine 10 MG tablet   Commonly known as: NORVASC   Take 10 mg by mouth daily.      aspirin EC 81 MG tablet   Take 81 mg by mouth daily.      cetirizine 10 MG tablet   Commonly known as: ZYRTEC   Take 10 mg by mouth daily.      cholecalciferol 1000 UNITS tablet   Commonly known as: VITAMIN D   Take 2,000 Units by mouth daily.      esomeprazole 40 MG capsule   Commonly known as: NEXIUM   Take 40 mg by mouth daily before breakfast.      fluticasone 50 MCG/ACT nasal spray   Commonly known as: FLONASE   Place 2 sprays into the nose daily as needed. For allergies      HYDROcodone-acetaminophen 5-325 MG per tablet   Commonly known as: NORCO/VICODIN   Take 1-2 tablets by mouth every 4 (four) hours as needed.      irbesartan-hydrochlorothiazide 300-12.5 MG per tablet   Commonly known as: AVALIDE   Take 1 tablet by mouth daily.      nebivolol 10 MG tablet   Commonly known as: BYSTOLIC   Take 10 mg by mouth daily.  vitamin C 1000 MG tablet   Take 1,000 mg by mouth daily.             At home rest most of the time. Get up 9 or 10 times each day and take a 15 or 20 minute walk. No riding in the car and to your first postoperative appointment. If you have neck surgery you may shower from the chest down starting on the third postoperative day. If you had back surgery he may start showering on the third postoperative day with saran wrap wrapped around your incisional area 3 times. After the shower remove the saran wrap. Take pain medicine as needed and other medications as instructed. Call my office for an appointment.  SignedReinaldo Meeker, MD 07/21/2012, 8:48 AM

## 2012-07-21 NOTE — Progress Notes (Signed)
Pt doing well. Pt has ambulated in the halls without difficulty. Pain is controlled with oral meds. Pt and family given D/C instructions with Rx's, both verbalized understanding of teaching. Pt D/C'd home via wheelchair with family per MD order. Rema Fendt, RN

## 2012-07-23 ENCOUNTER — Encounter (HOSPITAL_COMMUNITY): Payer: Self-pay | Admitting: Neurosurgery

## 2013-10-05 ENCOUNTER — Inpatient Hospital Stay (HOSPITAL_COMMUNITY)
Admission: EM | Admit: 2013-10-05 | Discharge: 2013-10-25 | DRG: 231 | Disposition: A | Discharge status: Rehabilitation Facility | Payer: Medicare Other | Attending: Cardiothoracic Surgery | Admitting: Cardiothoracic Surgery

## 2013-10-05 DIAGNOSIS — Z951 Presence of aortocoronary bypass graft: Secondary | ICD-10-CM

## 2013-10-05 DIAGNOSIS — Z79899 Other long term (current) drug therapy: Secondary | ICD-10-CM

## 2013-10-05 DIAGNOSIS — I213 ST elevation (STEMI) myocardial infarction of unspecified site: Secondary | ICD-10-CM

## 2013-10-05 DIAGNOSIS — N179 Acute kidney failure, unspecified: Secondary | ICD-10-CM

## 2013-10-05 DIAGNOSIS — E8770 Fluid overload, unspecified: Secondary | ICD-10-CM

## 2013-10-05 DIAGNOSIS — R5381 Other malaise: Secondary | ICD-10-CM | POA: Diagnosis not present

## 2013-10-05 DIAGNOSIS — M47816 Spondylosis without myelopathy or radiculopathy, lumbar region: Secondary | ICD-10-CM

## 2013-10-05 DIAGNOSIS — Z6835 Body mass index (BMI) 35.0-35.9, adult: Secondary | ICD-10-CM

## 2013-10-05 DIAGNOSIS — E871 Hypo-osmolality and hyponatremia: Secondary | ICD-10-CM | POA: Diagnosis not present

## 2013-10-05 DIAGNOSIS — D62 Acute posthemorrhagic anemia: Secondary | ICD-10-CM | POA: Diagnosis not present

## 2013-10-05 DIAGNOSIS — G4733 Obstructive sleep apnea (adult) (pediatric): Secondary | ICD-10-CM | POA: Diagnosis present

## 2013-10-05 DIAGNOSIS — G4730 Sleep apnea, unspecified: Secondary | ICD-10-CM

## 2013-10-05 DIAGNOSIS — I498 Other specified cardiac arrhythmias: Secondary | ICD-10-CM | POA: Diagnosis not present

## 2013-10-05 DIAGNOSIS — Z87891 Personal history of nicotine dependence: Secondary | ICD-10-CM

## 2013-10-05 DIAGNOSIS — D696 Thrombocytopenia, unspecified: Secondary | ICD-10-CM | POA: Diagnosis not present

## 2013-10-05 DIAGNOSIS — I714 Abdominal aortic aneurysm, without rupture, unspecified: Secondary | ICD-10-CM

## 2013-10-05 DIAGNOSIS — E876 Hypokalemia: Secondary | ICD-10-CM

## 2013-10-05 DIAGNOSIS — I2589 Other forms of chronic ischemic heart disease: Secondary | ICD-10-CM | POA: Diagnosis present

## 2013-10-05 DIAGNOSIS — J9600 Acute respiratory failure, unspecified whether with hypoxia or hypercapnia: Secondary | ICD-10-CM | POA: Diagnosis not present

## 2013-10-05 DIAGNOSIS — E873 Alkalosis: Secondary | ICD-10-CM | POA: Diagnosis not present

## 2013-10-05 DIAGNOSIS — Z7982 Long term (current) use of aspirin: Secondary | ICD-10-CM

## 2013-10-05 DIAGNOSIS — I951 Orthostatic hypotension: Secondary | ICD-10-CM | POA: Diagnosis not present

## 2013-10-05 DIAGNOSIS — I5021 Acute systolic (congestive) heart failure: Secondary | ICD-10-CM

## 2013-10-05 DIAGNOSIS — I4891 Unspecified atrial fibrillation: Secondary | ICD-10-CM | POA: Diagnosis not present

## 2013-10-05 DIAGNOSIS — I2510 Atherosclerotic heart disease of native coronary artery without angina pectoris: Secondary | ICD-10-CM

## 2013-10-05 DIAGNOSIS — R7309 Other abnormal glucose: Secondary | ICD-10-CM | POA: Diagnosis not present

## 2013-10-05 DIAGNOSIS — K560 Paralytic ileus: Secondary | ICD-10-CM | POA: Diagnosis not present

## 2013-10-05 DIAGNOSIS — F419 Anxiety disorder, unspecified: Secondary | ICD-10-CM

## 2013-10-05 DIAGNOSIS — I10 Essential (primary) hypertension: Secondary | ICD-10-CM

## 2013-10-05 DIAGNOSIS — K219 Gastro-esophageal reflux disease without esophagitis: Secondary | ICD-10-CM | POA: Diagnosis present

## 2013-10-05 DIAGNOSIS — F411 Generalized anxiety disorder: Secondary | ICD-10-CM | POA: Diagnosis present

## 2013-10-05 DIAGNOSIS — I5023 Acute on chronic systolic (congestive) heart failure: Secondary | ICD-10-CM

## 2013-10-05 DIAGNOSIS — I2109 ST elevation (STEMI) myocardial infarction involving other coronary artery of anterior wall: Principal | ICD-10-CM

## 2013-10-05 DIAGNOSIS — Z9861 Coronary angioplasty status: Secondary | ICD-10-CM

## 2013-10-05 DIAGNOSIS — E669 Obesity, unspecified: Secondary | ICD-10-CM

## 2013-10-05 DIAGNOSIS — I824Z9 Acute embolism and thrombosis of unspecified deep veins of unspecified distal lower extremity: Secondary | ICD-10-CM | POA: Diagnosis not present

## 2013-10-05 HISTORY — DX: Unspecified atrial fibrillation: I48.91

## 2013-10-05 HISTORY — DX: Obesity, unspecified: E66.9

## 2013-10-05 HISTORY — DX: Acute posthemorrhagic anemia: D62

## 2013-10-05 HISTORY — DX: Angina pectoris, unspecified: I20.9

## 2013-10-05 HISTORY — DX: Disorder of kidney and ureter, unspecified: N28.9

## 2013-10-05 HISTORY — DX: Essential (primary) hypertension: I10

## 2013-10-05 HISTORY — PX: CARDIAC CATHETERIZATION: SHX172

## 2013-10-05 LAB — BASIC METABOLIC PANEL
BUN: 18 mg/dL (ref 6–23)
CO2: 33 mEq/L — ABNORMAL HIGH (ref 19–32)
Calcium: 9.6 mg/dL (ref 8.4–10.5)
Chloride: 96 mEq/L (ref 96–112)
Creatinine, Ser: 1.23 mg/dL (ref 0.50–1.35)
GFR calc Af Amer: 64 mL/min — ABNORMAL LOW (ref >90)
GFR calc non Af Amer: 55 mL/min — ABNORMAL LOW (ref >90)
Glucose, Bld: 119 mg/dL — ABNORMAL HIGH (ref 70–99)
Potassium: 3.1 mEq/L — ABNORMAL LOW (ref 3.5–5.1)
Sodium: 137 mEq/L (ref 135–145)

## 2013-10-05 LAB — CBC WITH DIFFERENTIAL/PLATELET
Basophils Absolute: 0 10*3/uL (ref 0.0–0.1)
Basophils Relative: 0 % (ref 0–1)
Eosinophils Absolute: 0.2 10*3/uL (ref 0.0–0.7)
Eosinophils Relative: 3 % (ref 0–5)
HCT: 39.7 % (ref 39.0–52.0)
Hemoglobin: 13.9 g/dL (ref 13.0–17.0)
Lymphocytes Relative: 32 % (ref 12–46)
Lymphs Abs: 2.5 10*3/uL (ref 0.7–4.0)
MCH: 31.1 pg (ref 26.0–34.0)
MCHC: 35 g/dL (ref 30.0–36.0)
MCV: 88.8 fL (ref 78.0–100.0)
Monocytes Absolute: 0.8 10*3/uL (ref 0.1–1.0)
Monocytes Relative: 10 % (ref 3–12)
Neutro Abs: 4.4 10*3/uL (ref 1.7–7.7)
Neutrophils Relative %: 56 % (ref 43–77)
Platelets: 266 10*3/uL (ref 150–400)
RBC: 4.47 MIL/uL (ref 4.22–5.81)
RDW: 13.3 % (ref 11.5–15.5)
WBC: 7.9 10*3/uL (ref 4.0–10.5)

## 2013-10-05 LAB — POCT I-STAT TROPONIN I: Troponin i, poc: 0.04 ng/mL (ref 0.00–0.08)

## 2013-10-05 LAB — TROPONIN I: Troponin I: <0.3 ng/mL (ref <0.30)

## 2013-10-05 MED ORDER — HEPARIN BOLUS VIA INFUSION
4000.0000 [IU] | Freq: Once | INTRAVENOUS | Status: AC
  Administered 2013-10-05: 4000 [IU] via INTRAVENOUS
  Filled 2013-10-05 (×2): qty 4000

## 2013-10-05 MED ORDER — ASPIRIN 325 MG PO TABS
325.0000 mg | ORAL_TABLET | ORAL | Status: DC
  Filled 2013-10-05: qty 1

## 2013-10-05 MED ORDER — NITROGLYCERIN 0.4 MG SL SUBL
0.4000 mg | SUBLINGUAL_TABLET | SUBLINGUAL | Status: DC | PRN
  Administered 2013-10-05: 0.4 mg via SUBLINGUAL
  Filled 2013-10-05: qty 25

## 2013-10-05 MED ORDER — NITROGLYCERIN IN D5W 200-5 MCG/ML-% IV SOLN
2.0000 ug/min | Freq: Once | INTRAVENOUS | Status: AC
  Administered 2013-10-05: 10 ug/min via INTRAVENOUS
  Filled 2013-10-05: qty 250

## 2013-10-05 MED ORDER — MORPHINE SULFATE 4 MG/ML IJ SOLN
4.0000 mg | Freq: Once | INTRAMUSCULAR | Status: DC
  Administered 2013-10-06: 4 mg via INTRAVENOUS
  Filled 2013-10-05: qty 1

## 2013-10-05 MED ORDER — FENTANYL CITRATE 0.05 MG/ML IJ SOLN
INTRAMUSCULAR | Status: AC
  Filled 2013-10-05: qty 2

## 2013-10-05 MED ORDER — LIDOCAINE HCL (PF) 1 % IJ SOLN
INTRAMUSCULAR | Status: AC
  Filled 2013-10-05: qty 30

## 2013-10-05 MED ORDER — ASPIRIN 81 MG PO CHEW: 243.0000 mg | CHEWABLE_TABLET | Freq: Once | ORAL | Status: DC

## 2013-10-05 MED ORDER — ASPIRIN 81 MG PO CHEW
324.0000 mg | CHEWABLE_TABLET | Freq: Once | ORAL | Status: AC
  Administered 2013-10-05: 324 mg via ORAL
  Filled 2013-10-05: qty 4

## 2013-10-05 MED ORDER — BIVALIRUDIN 250 MG IV SOLR
INTRAVENOUS | Status: AC
  Filled 2013-10-05: qty 250

## 2013-10-05 MED ORDER — HEPARIN (PORCINE) IN NACL 2-0.9 UNIT/ML-% IJ SOLN
INTRAMUSCULAR | Status: AC
  Filled 2013-10-05: qty 1000

## 2013-10-05 MED ORDER — VERAPAMIL HCL 2.5 MG/ML IV SOLN
INTRAVENOUS | Status: AC
  Filled 2013-10-05: qty 2

## 2013-10-05 MED ORDER — NITROGLYCERIN 0.2 MG/ML ON CALL CATH LAB
INTRAVENOUS | Status: AC
  Filled 2013-10-05: qty 1

## 2013-10-05 MED ORDER — MIDAZOLAM HCL 2 MG/2ML IJ SOLN
INTRAMUSCULAR | Status: AC
  Filled 2013-10-05: qty 2

## 2013-10-05 NOTE — ED Provider Notes (Signed)
CSN: [id number]     Arrival date & time 10/05/13  2246 History   First MD Initiated Contact with Patient 10/05/13 2301     Chief Complaint  Patient presents with  . Chest Pain   (Consider location/radiation/quality/duration/timing/severity/associated sxs/prior Treatment) HPI  79 year old male with chest pain. Intermittent. First noticed 2 days ago while playing golf. Pain is in the center the chest and describes it as pressure and cramping. Does not radiate. These episodes of pain initially resolved by themselves. Began having pain again about an hour prior to arrival to the emergency room decided to come in because it did not improve. Mild shortness of breath. No diaphoresis or nausea. History of hypertension. Former smoker. Denies drug use. No history of coronary artery disease. No cardiologist. Took 81mg  ASA earlier today.   Past Medical History  Diagnosis Date  . Acid reflux   . Family history of anesthesia complication     daughter had problem waking up  . Pneumonia     hx of  . Sleep apnea     no treatment for, last sleep study over 5 years ago  . Abdominal aortic aneurysm     found 06/29/12 "small"  . Headache   . Herniated vertebral disc     thoracic area  . Cataracts, bilateral     hx of  . H/O Legionnaire's disease     about 24 years ago  . Anxiety     panic attacks   . Claustrophobia     "severe"  . Hypertension     sees  Dr. Ronne Binning [phone]   Past Surgical History  Procedure Laterality Date  . Shoulder surgery      left  . Cataract extraction w/ intraocular lens implant      bilateral  . Lumbar laminectomy/decompression microdiscectomy  07/20/2012    Procedure: LUMBAR LAMINECTOMY/DECOMPRESSION MICRODISCECTOMY 1 LEVEL;  Surgeon: Reinaldo Meeker, MD;  Location: [facility];  Service: Neurosurgery;  Laterality: N/A;  lumbar four-five left   No family history on file. History  Substance Use Topics  . Smoking status: Former Games developer  . Smokeless tobacco:  Not on file     Comment: 50 years ago  . Alcohol Use: No    Review of Systems  All systems reviewed and negative, other than as noted in HPI.   Allergies  Demerol; Lisinopril; and Shellfish allergy  Home Medications   Current Outpatient Rx  Name  Route  Sig  Dispense  Refill  . ALPRAZolam (XANAX) 0.25 MG tablet   Oral   Take 0.25 mg by mouth 3 (three) times daily as needed. For anxiety         . amLODipine (NORVASC) 10 MG tablet   Oral   Take 10 mg by mouth daily.         . Ascorbic Acid (VITAMIN C) 1000 MG tablet   Oral   Take 1,000 mg by mouth daily.         Marland Kitchen aspirin EC 81 MG tablet   Oral   Take 81 mg by mouth daily.         . cetirizine (ZYRTEC) 10 MG tablet   Oral   Take 10 mg by mouth daily.         . cholecalciferol (VITAMIN D) 1000 UNITS tablet   Oral   Take 2,000 Units by mouth daily.         Marland Kitchen esomeprazole (NEXIUM) 40 MG capsule   Oral   Take  40 mg by mouth daily before breakfast.         . fluticasone (FLONASE) 50 MCG/ACT nasal spray   Nasal   Place 2 sprays into the nose daily as needed. For allergies         . irbesartan-hydrochlorothiazide (AVALIDE) 300-12.5 MG per tablet   Oral   Take 1 tablet by mouth daily.         . nebivolol (BYSTOLIC) 10 MG tablet   Oral   Take 10 mg by mouth daily.          BP 122/80  Pulse 81  Temp(Src) 97.6 F (36.4 C) (Oral)  Resp 18  Ht 6\' 5"  (1.956 m)  Wt 250 lb (113.399 kg)  BMI 29.64 kg/m2  SpO2 95% Physical Exam  Nursing note and vitals reviewed. Constitutional: He appears well-developed and well-nourished. No distress.  HENT:  Head: Normocephalic and atraumatic.  Eyes: Conjunctivae are normal. Right eye exhibits no discharge. Left eye exhibits no discharge.  Neck: Neck supple.  Cardiovascular: Normal rate, regular rhythm and normal heart sounds.  Exam reveals no gallop and no friction rub.   No murmur heard. Pulmonary/Chest: Effort normal and breath sounds normal. No  respiratory distress.  Abdominal: Soft. He exhibits no distension. There is no tenderness.  Musculoskeletal: He exhibits no edema and no tenderness.  Lower extremities symmetric as compared to each other. No calf tenderness. Negative Homan's. No palpable cords.   Neurological: He is alert.  Skin: Skin is warm and dry. He is not diaphoretic.  Psychiatric: He has a normal mood and affect. His behavior is normal. Thought content normal.    ED Course  Procedures (including critical care time) Labs Review Labs Reviewed  BASIC METABOLIC PANEL  CBC WITH DIFFERENTIAL  TROPONIN I  POCT I-STAT TROPONIN I   Imaging Review No results found.  EKG Interpretation   None      EKG:  Rhythm: normal sinus Vent. rate 79 BPM PR interval 207 ms QRS duration 104 ms QT/QTc 403/462 ms ST segments: STE anteriorly/laterally. ST depression inferiorly.    MDM   1. STEMI (ST elevation myocardial infarction)    79 year old male with chest pain. EKG consistent with a STEMI. ST segment elevation anteriorly and laterally with ST depression inferiorly. Normotensive. Cath Lab activated. Aspirin. Heparin bolus.     Raeford Razor, MD 10/05/13 [phone]

## 2013-10-05 NOTE — ED Notes (Signed)
Pt given 324 of Aspirin and Nitroglycerin 0.4 SL

## 2013-10-05 NOTE — ED Notes (Signed)
Pt arrived to [facility] via [facility].

## 2013-10-05 NOTE — ED Notes (Signed)
Pt c/o upper chest pain non radiating, described as cramping x 2 days. Pain worse 1 hour ago, unrelieved. Pt does take 1 ASA 81mg 

## 2013-10-06 ENCOUNTER — Inpatient Hospital Stay (HOSPITAL_COMMUNITY): Payer: Medicare Other

## 2013-10-06 ENCOUNTER — Encounter (HOSPITAL_COMMUNITY): Payer: Self-pay | Admitting: Cardiology

## 2013-10-06 ENCOUNTER — Encounter (HOSPITAL_COMMUNITY)
Admission: EM | Disposition: A | Discharge status: Rehabilitation Facility | Payer: PRIVATE HEALTH INSURANCE | Source: Home / Self Care | Attending: Cardiothoracic Surgery

## 2013-10-06 DIAGNOSIS — I517 Cardiomegaly: Secondary | ICD-10-CM

## 2013-10-06 DIAGNOSIS — I2510 Atherosclerotic heart disease of native coronary artery without angina pectoris: Secondary | ICD-10-CM

## 2013-10-06 DIAGNOSIS — Z9861 Coronary angioplasty status: Secondary | ICD-10-CM

## 2013-10-06 DIAGNOSIS — I219 Acute myocardial infarction, unspecified: Secondary | ICD-10-CM

## 2013-10-06 DIAGNOSIS — I714 Abdominal aortic aneurysm, without rupture: Secondary | ICD-10-CM | POA: Diagnosis present

## 2013-10-06 DIAGNOSIS — M47816 Spondylosis without myelopathy or radiculopathy, lumbar region: Secondary | ICD-10-CM | POA: Diagnosis present

## 2013-10-06 DIAGNOSIS — E669 Obesity, unspecified: Secondary | ICD-10-CM

## 2013-10-06 DIAGNOSIS — I2109 ST elevation (STEMI) myocardial infarction involving other coronary artery of anterior wall: Secondary | ICD-10-CM | POA: Diagnosis present

## 2013-10-06 DIAGNOSIS — Z01810 Encounter for preprocedural cardiovascular examination: Secondary | ICD-10-CM

## 2013-10-06 DIAGNOSIS — G4730 Sleep apnea, unspecified: Secondary | ICD-10-CM | POA: Diagnosis present

## 2013-10-06 DIAGNOSIS — I10 Essential (primary) hypertension: Secondary | ICD-10-CM

## 2013-10-06 DIAGNOSIS — F419 Anxiety disorder, unspecified: Secondary | ICD-10-CM | POA: Diagnosis present

## 2013-10-06 HISTORY — PX: LEFT HEART CATH: SHX5478

## 2013-10-06 HISTORY — DX: Essential (primary) hypertension: I10

## 2013-10-06 HISTORY — DX: Obesity, unspecified: E66.9

## 2013-10-06 LAB — CBC
HCT: 37.4 % — ABNORMAL LOW (ref 39.0–52.0)
Hemoglobin: 13.1 g/dL (ref 13.0–17.0)
MCH: 31.2 pg (ref 26.0–34.0)
MCHC: 35 g/dL (ref 30.0–36.0)
MCV: 89 fL (ref 78.0–100.0)
Platelets: 247 10*3/uL (ref 150–400)
RBC: 4.2 MIL/uL — ABNORMAL LOW (ref 4.22–5.81)

## 2013-10-06 LAB — URINALYSIS, ROUTINE W REFLEX MICROSCOPIC
Bilirubin Urine: NEGATIVE
Glucose, UA: NEGATIVE mg/dL
Hgb urine dipstick: NEGATIVE
Ketones, ur: NEGATIVE mg/dL
Leukocytes, UA: NEGATIVE
Nitrite: NEGATIVE
Protein, ur: NEGATIVE mg/dL
Specific Gravity, Urine: >1.046 — ABNORMAL HIGH (ref 1.005–1.030)
Urobilinogen, UA: 0.2 mg/dL (ref 0.0–1.0)
pH: 6 (ref 5.0–8.0)

## 2013-10-06 LAB — CK TOTAL AND CKMB (NOT AT ARMC): Relative Index: 4.2 — ABNORMAL HIGH (ref 0.0–2.5)

## 2013-10-06 LAB — TROPONIN I
Troponin I: >20 ng/mL (ref <0.30)
Troponin I: >20 ng/mL (ref <0.30)
Troponin I: >20 ng/mL (ref <0.30)
Troponin I: >20 ng/mL (ref <0.30)

## 2013-10-06 LAB — BASIC METABOLIC PANEL
BUN: 19 mg/dL (ref 6–23)
CO2: 30 mEq/L (ref 19–32)
Calcium: 9.1 mg/dL (ref 8.4–10.5)
Creatinine, Ser: 1.07 mg/dL (ref 0.50–1.35)
GFR calc non Af Amer: 65 mL/min — ABNORMAL LOW (ref >90)
Glucose, Bld: 118 mg/dL — ABNORMAL HIGH (ref 70–99)
Sodium: 137 mEq/L (ref 135–145)

## 2013-10-06 LAB — SURGICAL PCR SCREEN
MRSA, PCR: NEGATIVE
Staphylococcus aureus: NEGATIVE

## 2013-10-06 LAB — HEPARIN LEVEL (UNFRACTIONATED): Heparin Unfractionated: 0.11 IU/mL — ABNORMAL LOW (ref 0.30–0.70)

## 2013-10-06 LAB — HEMOGLOBIN A1C
Hgb A1c MFr Bld: 5.5 % (ref <5.7)
Mean Plasma Glucose: 111 mg/dL (ref <117)

## 2013-10-06 LAB — MRSA PCR SCREENING: MRSA by PCR: NEGATIVE

## 2013-10-06 LAB — ABO/RH: ABO/RH(D): A POS

## 2013-10-06 SURGERY — LEFT HEART CATH
Anesthesia: LOCAL | Laterality: Right

## 2013-10-06 MED ORDER — SODIUM CHLORIDE 0.9 % IJ SOLN
3.0000 mL | Freq: Two times a day (BID) | INTRAMUSCULAR | Status: DC
  Administered 2013-10-07: 3 mL via INTRAVENOUS

## 2013-10-06 MED ORDER — CHLORHEXIDINE GLUCONATE CLOTH 2 % EX PADS
6.0000 | MEDICATED_PAD | Freq: Every day | CUTANEOUS | Status: DC
  Administered 2013-10-06: 6 via TOPICAL

## 2013-10-06 MED ORDER — PANTOPRAZOLE SODIUM 40 MG PO PACK
40.0000 mg | PACK | Freq: Every day | ORAL | Status: DC
  Filled 2013-10-06: qty 20

## 2013-10-06 MED ORDER — SODIUM CHLORIDE 0.9 % IJ SOLN: 3.0000 mL | INTRAMUSCULAR | Status: DC | PRN

## 2013-10-06 MED ORDER — NITROGLYCERIN IN D5W 200-5 MCG/ML-% IV SOLN
2.0000 ug/min | INTRAVENOUS | Status: DC
  Administered 2013-10-06: 5 ug/min via INTRAVENOUS

## 2013-10-06 MED ORDER — EPTIFIBATIDE 75 MG/100ML IV SOLN
2.0000 ug/kg/min | INTRAVENOUS | Status: DC
  Administered 2013-10-06 (×2): 2 ug/kg/min via INTRAVENOUS
  Filled 2013-10-06 (×5): qty 100

## 2013-10-06 MED ORDER — ASPIRIN EC 81 MG PO TBEC
81.0000 mg | DELAYED_RELEASE_TABLET | Freq: Every day | ORAL | Status: DC
  Administered 2013-10-06 – 2013-10-07 (×2): 81 mg via ORAL
  Filled 2013-10-06 (×3): qty 1

## 2013-10-06 MED ORDER — PANTOPRAZOLE SODIUM 40 MG PO TBEC
40.0000 mg | DELAYED_RELEASE_TABLET | Freq: Every day | ORAL | Status: DC
  Administered 2013-10-06 – 2013-10-07 (×2): 40 mg via ORAL
  Filled 2013-10-06 (×2): qty 1

## 2013-10-06 MED ORDER — TICAGRELOR 90 MG PO TABS
ORAL_TABLET | ORAL | Status: AC
  Filled 2013-10-06: qty 1

## 2013-10-06 MED ORDER — NITROGLYCERIN 0.4 MG SL SUBL: 0.4000 mg | SUBLINGUAL_TABLET | SUBLINGUAL | Status: DC | PRN

## 2013-10-06 MED ORDER — HEPARIN (PORCINE) IN NACL 100-0.45 UNIT/ML-% IJ SOLN
1300.0000 [IU]/h | INTRAMUSCULAR | Status: DC
  Administered 2013-10-06: 1100 [IU]/h via INTRAVENOUS
  Administered 2013-10-06: 1300 [IU]/h via INTRAVENOUS
  Filled 2013-10-06 (×2): qty 250

## 2013-10-06 MED ORDER — ATORVASTATIN CALCIUM 80 MG PO TABS
80.0000 mg | ORAL_TABLET | Freq: Every day | ORAL | Status: DC
  Administered 2013-10-07 (×2): 80 mg via ORAL
  Filled 2013-10-06 (×3): qty 1

## 2013-10-06 MED ORDER — BIVALIRUDIN 250 MG IV SOLR
INTRAVENOUS | Status: AC
  Filled 2013-10-06: qty 250

## 2013-10-06 MED ORDER — SODIUM CHLORIDE 0.9 % IV SOLN
INTRAVENOUS | Status: AC
  Administered 2013-10-06: 03:00:00 via INTRAVENOUS

## 2013-10-06 MED ORDER — EPTIFIBATIDE 75 MG/100ML IV SOLN
2.0000 ug/kg/min | INTRAVENOUS | Status: AC
  Administered 2013-10-06 – 2013-10-07 (×4): 2 ug/kg/min via INTRAVENOUS
  Filled 2013-10-06 (×12): qty 100

## 2013-10-06 MED ORDER — SODIUM CHLORIDE 0.9 % IV SOLN
250.0000 mL | INTRAVENOUS | Status: DC | PRN
  Administered 2013-10-08: 14:00:00 via INTRAVENOUS

## 2013-10-06 MED ORDER — ACETAMINOPHEN 325 MG PO TABS
650.0000 mg | ORAL_TABLET | ORAL | Status: DC | PRN
  Administered 2013-10-06 – 2013-10-07 (×2): 650 mg via ORAL
  Filled 2013-10-06 (×2): qty 2

## 2013-10-06 MED ORDER — ALPRAZOLAM 0.5 MG PO TABS
0.5000 mg | ORAL_TABLET | Freq: Three times a day (TID) | ORAL | Status: DC | PRN
  Administered 2013-10-06 (×3): 0.5 mg via ORAL
  Filled 2013-10-06 (×4): qty 1

## 2013-10-06 MED ORDER — HEPARIN (PORCINE) IN NACL 100-0.45 UNIT/ML-% IJ SOLN
1100.0000 [IU]/h | INTRAMUSCULAR | Status: DC
  Filled 2013-10-06: qty 250

## 2013-10-06 MED ORDER — EPTIFIBATIDE 75 MG/100ML IV SOLN
INTRAVENOUS | Status: AC
  Administered 2013-10-06: 2 ug/kg/min via INTRAVENOUS
  Filled 2013-10-06: qty 100

## 2013-10-06 MED ORDER — METOPROLOL TARTRATE 25 MG PO TABS
25.0000 mg | ORAL_TABLET | Freq: Two times a day (BID) | ORAL | Status: DC
  Filled 2013-10-06: qty 1

## 2013-10-06 MED ORDER — FUROSEMIDE 10 MG/ML IJ SOLN
40.0000 mg | Freq: Two times a day (BID) | INTRAMUSCULAR | Status: DC
  Administered 2013-10-06 – 2013-10-07 (×3): 40 mg via INTRAVENOUS
  Filled 2013-10-06 (×6): qty 4

## 2013-10-06 MED ORDER — MUPIROCIN 2 % EX OINT: 1.0000 "application " | TOPICAL_OINTMENT | Freq: Two times a day (BID) | CUTANEOUS | Status: DC

## 2013-10-06 MED ORDER — ALPRAZOLAM 0.5 MG PO TABS
0.5000 mg | ORAL_TABLET | Freq: Once | ORAL | Status: AC
  Administered 2013-10-06: 0.5 mg via ORAL

## 2013-10-06 MED ORDER — ALPRAZOLAM 0.5 MG PO TABS
1.0000 mg | ORAL_TABLET | Freq: Four times a day (QID) | ORAL | Status: DC | PRN
  Administered 2013-10-06: 0.5 mg via ORAL
  Administered 2013-10-07: 1 mg via ORAL
  Filled 2013-10-06: qty 2

## 2013-10-06 MED ORDER — METOPROLOL TARTRATE 25 MG PO TABS
25.0000 mg | ORAL_TABLET | Freq: Two times a day (BID) | ORAL | Status: DC
  Filled 2013-10-06 (×2): qty 1

## 2013-10-06 MED ORDER — MORPHINE SULFATE 2 MG/ML IJ SOLN
2.0000 mg | INTRAMUSCULAR | Status: DC | PRN
  Administered 2013-10-06 (×4): 2 mg via INTRAVENOUS
  Filled 2013-10-06 (×4): qty 1

## 2013-10-06 MED ORDER — ONDANSETRON HCL 4 MG/2ML IJ SOLN: 4.0000 mg | Freq: Four times a day (QID) | INTRAMUSCULAR | Status: DC | PRN

## 2013-10-06 MED ORDER — METOPROLOL TARTRATE 25 MG PO TABS
25.0000 mg | ORAL_TABLET | Freq: Two times a day (BID) | ORAL | Status: DC
  Administered 2013-10-06 – 2013-10-07 (×4): 25 mg via ORAL
  Filled 2013-10-06 (×6): qty 1

## 2013-10-06 MED ORDER — METOPROLOL TARTRATE 25 MG PO TABS
25.0000 mg | ORAL_TABLET | Freq: Once | ORAL | Status: AC
  Administered 2013-10-06: 25 mg via ORAL
  Filled 2013-10-06: qty 1

## 2013-10-06 NOTE — Progress Notes (Addendum)
ANTICOAGULATION CONSULT NOTE - Initial Consult  Pharmacy Consult for Heparin  Indication: IABP   Allergies  Allergen Reactions  . Demerol [Meperidine] Other (See Comments)    "makes me crazy"  . Lisinopril Diarrhea  . Shellfish Allergy Rash    Patient Measurements: Height: 6\' 5"  (195.6 cm) Weight: 250 lb (113.399 kg) IBW/kg (Calculated) : 89.1 Heparin Dosing Weight: 112 kg  Vital Signs: Temp: 97.6 F (36.4 C) (11/01 2250) Temp src: Oral (11/01 2250) BP: 110/60 mmHg (11/01 2309) Pulse Rate: 40 (11/02 0121)  Labs:  Recent Labs  10/05/13 2300  HGB 13.9  HCT 39.7  PLT 266  CREATININE 1.23  TROPONINI <0.30    Estimated Creatinine Clearance: 70.3 ml/min (by C-G formula based on Cr of 1.23).   Medical History: Past Medical History  Diagnosis Date  . Acid reflux   . Family history of anesthesia complication     daughter had problem waking up  . Pneumonia     hx of  . Sleep apnea     no treatment for, last sleep study over 5 years ago  . Abdominal aortic aneurysm     found 06/29/12 "small"  . Headache   . Herniated vertebral disc     thoracic area  . Cataracts, bilateral     hx of  . H/O Legionnaire's disease     about 24 years ago  . Anxiety     panic attacks   . Claustrophobia     "severe"  . Hypertension     sees  Dr. Ronne Binning [phone]   Assessment: 79 y/o M s/p cath with IABP in place to start heparin per pharmacy, also has integrilin running per RN (started during downtime). CBC/renal function ok. No oral AC PTA, will order INR with next labs.   Goal of Therapy:  Heparin level 0.2-0.5 units/ml Monitor platelets by anticoagulation protocol: Yes   Plan:  -NO BOLUS given IABP -Start heparin drip at 1100 units/hr -8 hour HL at 1000 -Daily CBC/HL -Monitor for bleeding -F/U duration for Integrilin   Thank you for allowing me to take part in this patient's care,  Abran Duke, PharmD Clinical Pharmacist Phone: [phone] Pager:  [phone] 10/06/2013 1:31 AM  -Adding integrilin for 36 hours -Continue at integrilin at 9mcg/kg/min (already started in cath lab) -Will check PLTS with CBC that is ordered for this AM (will be close to 6 hours) -Monitor PLTS daily   JLedford, PharmD 2:23 AM  Addendum 4:45 AM -PLTS this  AM are 247 -F/U PLTS with daily CBC JL

## 2013-10-06 NOTE — Progress Notes (Signed)
ANTICOAGULATION CONSULT NOTE - Follow Up Consult  Pharmacy Consult for Heparin Indication: s/p STEMI, CAD pending CABG, IABP  Allergies  Allergen Reactions  . Demerol [Meperidine] Other (See Comments)    "makes me crazy"  . Lisinopril Diarrhea  . Shellfish Allergy Rash    Patient Measurements: Height: 6\' 5"  (195.6 cm) Weight: 250 lb (113.399 kg) IBW/kg (Calculated) : 89.1 Heparin Dosing Weight: 112 kg  Vital Signs: Temp: 97.8 F (36.6 C) (11/02 0756) Temp src: Oral (11/02 0756) BP: 116/51 mmHg (11/02 1200) Pulse Rate: 72 (11/02 1200)  Labs:  Recent Labs  10/05/13 2300 10/06/13 0400 10/06/13 1030  HGB 13.9 13.1  --   HCT 39.7 37.4*  --   PLT 266 247  --   HEPARINUNFRC  --   --  0.11*  CREATININE 1.23 1.07  --   CKTOTAL  --  7729*  --   CKMB  --  328.1*  --   TROPONINI <0.30 >20.00* >20.00*    Estimated Creatinine Clearance: 80.8 ml/min (by C-G formula based on Cr of 1.07).   Medications:  Scheduled:  . aspirin EC  81 mg Oral Daily  . atorvastatin  80 mg Oral q1800  . metoprolol tartrate  25 mg Oral BID  . pantoprazole  40 mg Oral Daily  . sodium chloride  3 mL Intravenous Q12H   Infusions:  . eptifibatide    . heparin 1,100 Units/hr (10/06/13 0700)  . nitroGLYCERIN 5 mcg/min (10/06/13 0700)    Assessment: 79 yo M s/p STEMI taken urgently to cath lab 11/1 PM. He underwent emergency LAD PTCA and will need a surgical consult for CABG. At cath he had LAD, large Dx1, and SP disease. Post PTCA he was placed on an IABP for LAD perfusion.  Plan for CABG Tuesday 11/4.  Pt also continues on Integrelin at 2 mcg/kg/min.  Heparin level is subtherapeutic on 1100 units/hr.  CBC remain stable post-cath with no signs of bleeding noted.  Will increase cautiously.   Goal of Therapy:  Heparin level 0.2-0.5 units/ml Monitor platelets by anticoagulation protocol: Yes   Plan:  Increase heparin to 1300 units/hr. Heparin level in 6 hours. Heparin level and CBC  daily.  [employer], Pharm.D., BCPS Clinical Pharmacist Pager [phone] 10/06/2013 12:24 PM

## 2013-10-06 NOTE — H&P (Signed)
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 He described pain in the center the chest as "pressure and cramping. DIt does not radiate. These episodes of pain initially resolved by themselves. He began having pain again about an hour prior to arrival to the emergency room decided to come in because it did not improve. He has had mild shortness of breath, no diaphoresis or nausea. He has a history of hypertension and is a former smoker.. He has no history of coronary artery disease. He did take 81mg  ASA earlier today.          He was taken urgently to the cath lab by Dr Herbie Baltimore. He underwent emergency LAD PTCA and will need a surgical consult for CABG. At Cath he had LAD, large Dx1, and SP disease. Post PTCA he was placed on an IABP for LAD perfusion. He tolerated this well. In retrospect, the pt admits to some chest pain earlier this week while playing golf. He admits to "weak spells" for the past couple of months.    Problem List: Past Medical History  Diagnosis Date  . Acid reflux   . Family history of anesthesia complication     daughter had problem waking up  . Pneumonia     hx of  . Sleep apnea     no treatment for, last sleep study over 5 years ago  . Abdominal aortic aneurysm     found 06/29/12 "small"  . Headache(784.0)   . Herniated vertebral disc     thoracic area  . Cataracts, bilateral     hx of  . H/O Legionnaire's disease     about 24 years ago  . Anxiety     panic attacks   . Claustrophobia     "severe"  . Hypertension     sees  Dr. Ronne Binning [phone]  . Obesity (BMI 35.0-39.9 without comorbidity) 10/06/2013  . Essential hypertension 10/06/2013    Past Surgical History  Procedure Laterality Date  . Shoulder surgery      left  . Cataract extraction w/  intraocular lens implant      bilateral  . Lumbar laminectomy/decompression microdiscectomy  07/20/2012    Procedure: LUMBAR LAMINECTOMY/DECOMPRESSION MICRODISCECTOMY 1 LEVEL;  Surgeon: Reinaldo Meeker, MD;  Location: [facility];  Service: Neurosurgery;  Laterality: N/A;  lumbar four-five left     Allergies:  Allergies  Allergen Reactions  . Demerol [Meperidine] Other (See Comments)    "makes me crazy"  . Lisinopril Diarrhea  . Shellfish Allergy Rash     Home Medications Current Facility-Administered Medications  Medication Dose Route Frequency Provider Last Rate Last Dose  . 0.9 %  sodium chloride infusion  250 mL Intravenous PRN Marykay Lex, MD      . acetaminophen (TYLENOL) tablet 650 mg  650 mg Oral Q4H PRN Marykay Lex, MD   650 mg at 10/06/13 0736  . ALPRAZolam Prudy Feeler) tablet 0.5 mg  0.5 mg Oral TID PRN Marykay Lex, MD   0.5 mg at 10/06/13 8295  . aspirin EC tablet 81 mg  81 mg Oral Daily Marykay Lex, MD   81 mg at 10/06/13 1002  . atorvastatin (LIPITOR) tablet 80 mg  80 mg Oral q1800 Marykay Lex, MD      . eptifibatide (INTEGRILIN) 75 mg /  100 mL infusion  2 mcg/kg/min Intravenous Continuous Kerin Perna, MD      . heparin ADULT infusion 100 units/mL (25000 units/250 mL)  1,100 Units/hr Intravenous Continuous Marykay Lex, MD 11 mL/hr at 10/06/13 0700 1,100 Units/hr at 10/06/13 0700  . metoprolol tartrate (LOPRESSOR) tablet 25 mg  25 mg Oral BID Eda Paschal Yakir Wenke, PA-C   25 mg at 10/06/13 1002  . morphine 2 MG/ML injection 2 mg  2 mg Intravenous Q1H PRN Marykay Lex, MD   2 mg at 10/06/13 0736  . nitroGLYCERIN (NITROSTAT) SL tablet 0.4 mg  0.4 mg Sublingual Q5 min PRN Raeford Razor, MD   0.4 mg at 10/05/13 2309  . nitroGLYCERIN 0.2 mg/mL in dextrose 5 % infusion  2-200 mcg/min Intravenous Continuous Marykay Lex, MD 1.5 mL/hr at 10/06/13 0700 5 mcg/min at 10/06/13 0700  . ondansetron (ZOFRAN) injection 4 mg  4 mg Intravenous Q6H PRN Marykay Lex, MD       . pantoprazole (PROTONIX) EC tablet 40 mg  40 mg Oral Daily Marykay Lex, MD   40 mg at 10/06/13 1008  . sodium chloride 0.9 % injection 3 mL  3 mL Intravenous Q12H Marykay Lex, MD      . sodium chloride 0.9 % injection 3 mL  3 mL Intravenous PRN Marykay Lex, MD         FMHx- apparently his son was recently diagnosed with HOCM and is to have a work up at [facility] (the pt is not aware of this diagnosis)  History   Social History  . Marital Status: Widowed    Spouse Name: N/A    Number of Children: N/A  . Years of Education: N/A   Occupational History  . Not on file.   Social History Main Topics  . Smoking status: Former Games developer  . Smokeless tobacco: Not on file     Comment: 50 years ago  . Alcohol Use: No  . Drug Use: No  . Sexual Activity:    Other Topics Concern  . Not on file   Social History Narrative  . No narrative on file     Review of Systems: General: negative for chills, fever, night sweats or weight changes.  Cardiovascular: negative for chest pain, dyspnea on exertion, edema, orthopnea, palpitations, paroxysmal nocturnal dyspnea or shortness of breath Dermatological: negative for rash Respiratory: negative for cough or wheezing Urologic: negative for hematuria Abdominal: negative for nausea, vomiting, diarrhea, bright red blood per rectum, melena, or hematemesis Neurologic: negative for visual changes, syncope, or dizziness, chronic back pain and some numbness in his Lt foot after back surgery Aug 2013 All other systems reviewed and are otherwise negative except as noted above.  Physical Exam: Blood pressure 134/69, pulse 74, temperature 97.8 F (36.6 C), temperature source Oral, resp. rate 21, height 6\' 5"  (1.956 m), weight 250 lb (113.399 kg), SpO2 95.00%.  General appearance: alert, cooperative, no distress and moderately obese Neck: no carotid bruit and no JVD Lungs: clear to auscultation bilaterally Heart: regular rate and rhythm Abdomen:  obese Extremities: no edema Pulses: 2+ and symmetric Skin: pale, cool, dry Neurologic: Grossly normal    Labs:   Results for orders placed during the hospital encounter of 10/05/13 (from the past 24 hour(s))  BASIC METABOLIC PANEL     Status: Abnormal   Collection Time    10/05/13 11:00 PM      Result Value Range   Sodium 137  135 -  145 mEq/L   Potassium 3.1 (*) 3.5 - 5.1 mEq/L   Chloride 96  96 - 112 mEq/L   CO2 33 (*) 19 - 32 mEq/L   Glucose, Bld 119 (*) 70 - 99 mg/dL   BUN 18  6 - 23 mg/dL   Creatinine, Ser 9.14  0.50 - 1.35 mg/dL   Calcium 9.6  8.4 - 78.2 mg/dL   GFR calc non Af Amer 55 (*) >90 mL/min   GFR calc Af Amer 64 (*) >90 mL/min  CBC WITH DIFFERENTIAL     Status: None   Collection Time    10/05/13 11:00 PM      Result Value Range   WBC 7.9  4.0 - 10.5 K/uL   RBC 4.47  4.22 - 5.81 MIL/uL   Hemoglobin 13.9  13.0 - 17.0 g/dL   HCT 95.6  21.3 - 08.6 %   MCV 88.8  78.0 - 100.0 fL   MCH 31.1  26.0 - 34.0 pg   MCHC 35.0  30.0 - 36.0 g/dL   RDW 57.8  46.9 - 62.9 %   Platelets 266  150 - 400 K/uL   Neutrophils Relative % 56  43 - 77 %   Neutro Abs 4.4  1.7 - 7.7 K/uL   Lymphocytes Relative 32  12 - 46 %   Lymphs Abs 2.5  0.7 - 4.0 K/uL   Monocytes Relative 10  3 - 12 %   Monocytes Absolute 0.8  0.1 - 1.0 K/uL   Eosinophils Relative 3  0 - 5 %   Eosinophils Absolute 0.2  0.0 - 0.7 K/uL   Basophils Relative 0  0 - 1 %   Basophils Absolute 0.0  0.0 - 0.1 K/uL  TROPONIN I     Status: None   Collection Time    10/05/13 11:00 PM      Result Value Range   Troponin I <0.30  <0.30 ng/mL  POCT I-STAT TROPONIN I     Status: None   Collection Time    10/05/13 11:08 PM      Result Value Range   Troponin i, poc 0.04  0.00 - 0.08 ng/mL   Comment 3           MRSA PCR SCREENING     Status: None   Collection Time    10/06/13  2:34 AM      Result Value Range   MRSA by PCR NEGATIVE  NEGATIVE  CBC     Status: Abnormal   Collection Time    10/06/13  4:00 AM       Result Value Range   WBC 10.3  4.0 - 10.5 K/uL   RBC 4.20 (*) 4.22 - 5.81 MIL/uL   Hemoglobin 13.1  13.0 - 17.0 g/dL   HCT 52.8 (*) 41.3 - 24.4 %   MCV 89.0  78.0 - 100.0 fL   MCH 31.2  26.0 - 34.0 pg   MCHC 35.0  30.0 - 36.0 g/dL   RDW 01.0  27.2 - 53.6 %   Platelets 247  150 - 400 K/uL  BASIC METABOLIC PANEL     Status: Abnormal   Collection Time    10/06/13  4:00 AM      Result Value Range   Sodium 137  135 - 145 mEq/L   Potassium 3.8  3.5 - 5.1 mEq/L   Chloride 99  96 - 112 mEq/L   CO2 30  19 - 32 mEq/L   Glucose,  Bld 118 (*) 70 - 99 mg/dL   BUN 19  6 - 23 mg/dL   Creatinine, Ser 9.14  0.50 - 1.35 mg/dL   Calcium 9.1  8.4 - 78.2 mg/dL   GFR calc non Af Amer 65 (*) >90 mL/min   GFR calc Af Amer 75 (*) >90 mL/min  TROPONIN I     Status: Abnormal   Collection Time    10/06/13  4:00 AM      Result Value Range   Troponin I >20.00 (*) <0.30 ng/mL  CK TOTAL AND CKMB     Status: Abnormal   Collection Time    10/06/13  4:00 AM      Result Value Range   Total CK 7729 (*) 7 - 232 U/L   CK, MB 328.1 (*) 0.3 - 4.0 ng/mL   Relative Index 4.2 (*) 0.0 - 2.5     Radiology/Studies: No results found.  EKG: NSR with ant ST elevation  ASSESSMENT AND PLAN:  Principal Problem:   STEMI of anterior wall,  10/05/13 Active Problems:   S/P urgent LAD PTCA 10/06/13 with IABP for LAD perfusion   Essential hypertension   Obesity (BMI 35.0-39.9 without comorbidity)   Anxiety, claustrophobia   DJD  lumbar- surg August 2013   Sleep apnea- C-pap intol   AAA (abdominal aortic aneurysm)-hx of a "small" AAA found July 2013 (nothing in EPIC)   PLAN: Admit to SICU. CVTS to see. Currently stable on IABP but had pain when this was decreased to 1:2   Signed, Abelino Derrick, PA-C 10/06/2013, 10:34 AM

## 2013-10-06 NOTE — Progress Notes (Signed)
2cc air removed from TR band (0cc remain). No signs of bleeding/oozing. Will continue to monitor and remove TR band per protocol after 30 minutes.

## 2013-10-06 NOTE — Progress Notes (Signed)
ANTICOAGULATION CONSULT NOTE - Follow Up Consult  Pharmacy Consult for Heparin Indication: s/p STEMI, CAD pending CABG, IABP  Allergies  Allergen Reactions  . Demerol [Meperidine] Other (See Comments)    "makes me crazy"  . Lisinopril Diarrhea  . Shellfish Allergy Rash    Patient Measurements: Height: 6\' 5"  (195.6 cm) Weight: 250 lb (113.399 kg) IBW/kg (Calculated) : 89.1 Heparin Dosing Weight: 112 kg  Vital Signs: Temp: 97.6 F (36.4 C) (11/02 1612) Temp src: Oral (11/02 1612) BP: 111/58 mmHg (11/02 2000) Pulse Rate: 73 (11/02 2000)  Labs:  Recent Labs  10/05/13 2300 10/06/13 0400 10/06/13 1030 10/06/13 1530 10/06/13 1630 10/06/13 1855  HGB 13.9 13.1  --   --   --   --   HCT 39.7 37.4*  --   --   --   --   PLT 266 247  --   --   --   --   HEPARINUNFRC  --   --  0.11*  --   --  0.24*  CREATININE 1.23 1.07  --   --   --   --   CKTOTAL  --  7729*  --   --   --   --   CKMB  --  328.1*  --   --   --   --   TROPONINI <0.30 >20.00* >20.00* >20.00* >20.00*  --     Estimated Creatinine Clearance: 80.8 ml/min (by C-G formula based on Cr of 1.07).   Medications:  Scheduled:  . aspirin EC  81 mg Oral Daily  . atorvastatin  80 mg Oral q1800  . furosemide  40 mg Intravenous BID  . metoprolol tartrate  25 mg Oral BID  . pantoprazole  40 mg Oral Daily  . sodium chloride  3 mL Intravenous Q12H   Infusions:  . eptifibatide 2 mcg/kg/min (10/06/13 2000)  . heparin 1,300 Units/hr (10/06/13 2000)  . nitroGLYCERIN 15 mcg/min (10/06/13 2000)    Assessment: 79 yo M s/p STEMI taken urgently to cath lab 11/1 PM. He underwent emergency LAD PTCA and will need a surgical consult for CABG. At cath he had LAD, large Dx1, and SP disease. Post PTCA he was placed on an IABP for LAD perfusion.  Plan for CABG Tuesday 11/4.  Pt also continues on Integrelin at 2 mcg/kg/min.  Heparin level is at goal (HL= 0.24) on 1100 units/hr.     Goal of Therapy:  Heparin level 0.2-0.5  units/ml Monitor platelets by anticoagulation protocol: Yes   Plan:   -No heparin changes needed -Daily heparin level and CBC  Harland German, Pharm D 10/06/2013 8:22 PM

## 2013-10-06 NOTE — Progress Notes (Addendum)
Called to see secondary to arm pain,pt being uncomfortable. According to the family he takes 0.5 mg Xanax prn during the day and may take "several" before bed. I will increase his Xanax to 1 mg Q6 prn. He just received 0.5 mg po, will give him another does now. EKG shows no acute changes.  Corine Shelter PA-C 10/06/2013 3:23 PM   Agree with above - definitely some anxiety, however, his echo demonstrates significant cardiomyopathy with large wall motion abnormality. I think he will benefit from some diuresis - EDP was very high at cath.  Chrystie Nose, MD, [facility] Attending Cardiologist [facility]

## 2013-10-06 NOTE — Progress Notes (Signed)
Subjective:  No chest pain, currently on 1:1 IABP, apparently had chest pain on 1:2 last night.  Objective:  Vital Signs in the last 24 hours: Temp:  [97.6 F (36.4 C)-97.8 F (36.6 C)] 97.8 F (36.6 C) (11/02 0756) Pulse Rate:  [36-118] 73 (11/02 0700) Resp:  [11-23] 15 (11/02 0700) BP: (110-136)/(52-93) 136/89 mmHg (11/02 0700) SpO2:  [92 %-98 %] 97 % (11/02 0700) Weight:  [250 lb (113.399 kg)] 250 lb (113.399 kg) (11/01 2304)  Intake/Output from previous day:  Intake/Output Summary (Last 24 hours) at 10/06/13 0857 Last data filed at 10/06/13 0700  Gross per 24 hour  Intake    250 ml  Output    825 ml  Net   -575 ml    Physical Exam: General appearance: alert, cooperative, no distress and moderately obese Lungs: decreased breath sounds Heart: regular rate and rhythm and IABP noted Rt groin without hematoma, IABP in place   Rate: 74  Rhythm: normal sinus rhythm and some slow NSVT and a run of ideoventricular rythm  Lab Results:  Recent Labs  10/05/13 2300 10/06/13 0400  WBC 7.9 10.3  HGB 13.9 13.1  PLT 266 247    Recent Labs  10/05/13 2300 10/06/13 0400  NA 137 137  K 3.1* 3.8  CL 96 99  CO2 33* 30  GLUCOSE 119* 118*  BUN 18 19  CREATININE 1.23 1.07    Recent Labs  10/05/13 2300 10/06/13 0400  TROPONINI <0.30 >20.00*   No results found for this basename: INR,  in the last 72 hours  Imaging: Imaging results have been reviewed  Cardiac Studies:  Assessment/Plan:   Principal Problem:   ST elevation myocardial infarction (STEMI) of anterior wall, initial episode of care Active Problems:   S/P coronary angioplasty- urgent LAD PTCA 10/06/13 with IABP for LAD perfusion   Essential hypertension   Obesity (BMI 35.0-39.9 without comorbidity)   Anxiety, claustrophobia   DJD  lumbar- surg August 2013   Sleep apnea- C-pap intol   AAA (abdominal aortic aneurysm)-hx of a "small" AAA found July 2013 (nothing in EPIC)    PLAN: CVTS to see- consider  echo (large MI).   Corine Shelter PA-C Beeper [phone] 10/06/2013, 8:57 AM

## 2013-10-06 NOTE — Progress Notes (Signed)
TR band removed per protocol.  No signs of bleeding.  2x2 with tegaderm placed on site.  Will continue to monitor.

## 2013-10-06 NOTE — H&P (Addendum)
History and Physical Interval Note:   NAME:  Antonio Bernard   MRN: [id number] DOB:  07/14/1933   ADMIT DATE: 10/05/2013  Antonio Bernard is a widowed 79 y.o. male with only a medical history of hypertension, likely OSA with obesity (but not on treatment), and report of AAA along with anxiety. He was in his usual state of health until after playing a round of golf on Thursday, October 30. Shortly after playing he developed substernal chest pressure a roughly 6/10 that lasted several minutes. Since then he has had intermittent episodes of similar symptoms, worsened with exertion. He then had a severe episode earlier this evening it did not go away.  The initial episode in a roughly 6 PM then had waxing and waning symptoms until it became worse somewhere around 10 to 10:30 PM. He called his daughter who then called EMS. He is brought to the emergency room I don't have 3-4 mm (and one lead roughly 5) delayed as elevations in leads V2-the 6 as well as 1 and aVL. There was reciprocal ST depressions in 2 and aVF. He was brought emergently to the cardiac catheterization lab where he was evaluated. Upon arrival his pain level is roughly 3/10 after receiving nitroglycerin and morphine.  Past Medical History  Diagnosis Date  . Acid reflux   . Family history of anesthesia complication     daughter had problem waking up  . Pneumonia     hx of  . Sleep apnea     no treatment for, last sleep study over 5 years ago  . Abdominal aortic aneurysm     found 06/29/12 "small"  . Headache(784.0)   . Herniated vertebral disc     thoracic area  . Cataracts, bilateral     hx of  . H/O Legionnaire's disease     about 24 years ago  . Anxiety     panic attacks   . Claustrophobia     "severe"  . Hypertension     sees  Dr. Ronne Binning [phone]  . Obesity (BMI 35.0-39.9 without comorbidity) 10/06/2013  . Essential hypertension 10/06/2013   Past Surgical History  Procedure Laterality Date  . Shoulder  surgery      left  . Cataract extraction w/ intraocular lens implant      bilateral  . Lumbar laminectomy/decompression microdiscectomy  07/20/2012    Procedure: LUMBAR LAMINECTOMY/DECOMPRESSION MICRODISCECTOMY 1 LEVEL;  Surgeon: Reinaldo Meeker, MD;  Location: [facility];  Service: Neurosurgery;  Laterality: N/A;  lumbar four-five left   FAMHx: He does report a medical history of his father having an MI in his late 75s early 30s. No family history on file.  SOCHx:  reports that he has quit smoking. He does not have any smokeless tobacco history on file. He reports that he does not drink alcohol or use illicit drugs.  ALLERGIES: Allergies  Allergen Reactions  . Demerol [Meperidine] Other (See Comments)    "makes me crazy"  . Lisinopril Diarrhea  . Shellfish Allergy Rash    HOME MEDICATIONS: Prescriptions prior to admission  Medication Sig Dispense Refill  . ALPRAZolam (XANAX) 0.25 MG tablet Take 0.25 mg by mouth 3 (three) times daily as needed. For anxiety      . amLODipine (NORVASC) 10 MG tablet Take 10 mg by mouth daily.      . Ascorbic Acid (VITAMIN C) 1000 MG tablet Take 1,000 mg by mouth daily.      Marland Kitchen aspirin EC 81 MG  tablet Take 81 mg by mouth daily.      . cholecalciferol (VITAMIN D) 1000 UNITS tablet Take 2,000 Units by mouth daily.      Marland Kitchen esomeprazole (NEXIUM) 40 MG capsule Take 40 mg by mouth daily before breakfast.      . fluticasone (FLONASE) 50 MCG/ACT nasal spray Place 2 sprays into the nose daily as needed. For allergies      . irbesartan-hydrochlorothiazide (AVALIDE) 300-12.5 MG per tablet Take 1 tablet by mouth daily.      . nebivolol (BYSTOLIC) 10 MG tablet Take 10 mg by mouth daily.        PHYSICAL EXAM:Blood pressure 134/69, pulse 74, temperature 97.8 F (36.6 C), temperature source Oral, resp. rate 21, height 6\' 5"  (1.956 m), weight 250 lb (113.399 kg), SpO2 95.00%. General appearance: alert, cooperative, moderate distress, morbidly obese, pale and ill  appearing, but "more comfortable than before medicines given" Neck: no adenopathy, no carotid bruit, no JVD and too thick to truly identify JVP Lungs: Diminished breath sounds bibasilar, mild basilar rales Heart: regular rate and rhythm, S1, S2 normal and Possible soft S4, but no other M./R./G. Abdomen: soft, non-tender; bowel sounds normal; no masses,  no organomegaly and Morbidly obese Extremities: extremities normal, atraumatic, no cyanosis or edema Pulses: 2+ and symmetric Neurologic: Mental status: Alert, oriented, thought content appropriate Cranial nerves: normal  IMPRESSION & PLAN Principal Problem:   STEMI of anterior wall,  10/05/13 Active Problems:   Essential hypertension   Obesity (BMI 35.0-39.9 without comorbidity)   Anxiety, claustrophobia   DJD  lumbar- surg August 2013   Sleep apnea- C-pap intol   AAA (abdominal aortic aneurysm)-hx of a "small" AAA found July 2013 (nothing in EPIC)   [name] has presented today for Emergency Cardiac Catheterization, with the diagnosis of Anterior STEMI. The various methods of treatment have been discussed briefly with the patient & emergency verbal consent was obtained.  We will proceed with Emergent Cardiac Catheterization +/- PCI. Further plans will be per Cardiac Catheterization Report.  HARDING,DAVID W [facility] [address] [place], [place]  [address]  [phone]  Please note that this report was entered in a delayed fashion to to the electronic medical record not being available at time of admission due to system maintenance. A handwritten note was provided.  Marykay Lex, MD

## 2013-10-06 NOTE — Progress Notes (Signed)
Dr. Rennis Golden notified of patient c/o chest tightness and left arm pain.  Morphine 2 mg given, xanax .5mg  given po, and NTG drip increased to then to .  12 lead ECG done as per protocol.  Will come and see patient shortly.

## 2013-10-06 NOTE — Progress Notes (Addendum)
Pt. Seen and examined. Agree with the NP/PA-C note as written. 79 yo male with acute onset chest pain-> EKG showed marked anterior ST elevation and he was taken emergently to the cath lab by Dr. Herbie Baltimore. This revealed a proximally occluded LAD - after flow was restored it was apparently there was extensive LAD disease as well as severe 90-95% ostial D1 disease - the diagonal is a large vessel with many sub-branches. There is moderate to severe focal disease of the sub-branches as well. Balloon angioplasty was performed, but no stent was placed. TIMI 2 flow was noted in the LAD and an IABP was placed. I have personally reviewed the cath films this morning and spoken with Dr. Donata Clay.  Mr. Raia was weaned to 1:2 last night, but experienced chest pain and this was put back to 1:1.  Dr. Donata Clay has evaluated and will plan surgery on Tuesday.  I will review 2D echo today.  Chrystie Nose, MD, [facility] Attending Cardiologist [facility]

## 2013-10-06 NOTE — Progress Notes (Signed)
CRITICAL VALUE ALERT  Critical value received:      CK MB 328.1           Troponin >20   Date of notification:  10/06/13  Time of notification:  513  Critical value read back:yes  Nurse who received alert:  Okey Dupre, RN  MD notified (1st page):  Dr. Herbie Baltimore  Time of first page:  0520  Responding MD:  Dr. Herbie Baltimore  Time MD responded:  [phone]

## 2013-10-06 NOTE — Consult Note (Signed)
[address]       [place] [address]             [phone]        AWAD GLADD [facility] Medical Record #[id number] Date of Birth: [birth year]  Referring: No ref. provider found Primary Care: Thayer Headings, MD  Chief Complaint:    Chief Complaint  Patient presents with  . Chest Pain    acute anterior MI  History of Present Illness:     Patient examined, coronary arteriograms reviewed and 2-D echocardiogram pending.  Patient is 79 years old and started having chest pain progressing in intensity and duration starting Thursday after a round of golf. No prior history of CAD or MI. Prior history for hypertension and dyslipidemia. He presented to the emergency department late last night and had ST segment changes and positive cardiac enzymes. Urgent cardiac catheterization for stemi was carried out by Dr. Herbie Baltimore and demonstrated a occlusion of the LAD which was opened with PTCA. PCI-stent was not performed due to the long length of the LAD disease and large diagonal side branch. A balloon pump was placed in the cath lab to help improve flow through the LAD. The patient had relief of his pain with the PTCA and blood pressure remained stable. The right coronary and circumflex had no significant disease. LVEDP was 40 and a ventriculogram was not performed. A 2-D echocardiogram is pending. The patient stable on balloon pump, IV heparin protocol, and IV Integrilin.   The plan was to allow the patient to recover from his MI and then performed surgical CABG. The patient also received a ticagrelor load of 180 mg in the cath lab and will need a 72 hour washout.     Current Activity/ Functional Status: Patient lives independently at home.   the patient's wife died 2 years ago from hepatocellular carcinoma. Patient has 3 daughters who live close by.   Although patient had lumbar surgery earlier this year he is ambulatory. He works as a Education officer, environmental  Zubrod Score: At the  time of surgery this patient's most appropriate activity status/level should be described as: []  Normal activity, no symptoms [x]  Symptoms, fully ambulatory []  Symptoms, in bed less than or equal to 50% of the time []  Symptoms, in bed greater than 50% of the time but less than 100% []  Bedridden []  Moribund  Past Medical History  Diagnosis Date  . Acid reflux   . Family history of anesthesia complication     daughter had problem waking up  . Pneumonia     hx of  . Sleep apnea     no treatment for, last sleep study over 5 years ago  . Abdominal aortic aneurysm     found 06/29/12 "small"  . Headache(784.0)   . Herniated vertebral disc     thoracic area  . Cataracts, bilateral     hx of  . H/O Legionnaire's disease     about 24 years ago  . Anxiety     panic attacks   . Claustrophobia     "severe"  . Hypertension     sees  Dr. Ronne Binning [phone]  . Obesity (BMI 35.0-39.9 without comorbidity) 10/06/2013  . Essential hypertension 10/06/2013    Past Surgical History  Procedure Laterality Date  . Shoulder surgery      left  . Cataract extraction w/ intraocular lens implant      bilateral  . Lumbar laminectomy/decompression microdiscectomy  07/20/2012  Procedure: LUMBAR LAMINECTOMY/DECOMPRESSION MICRODISCECTOMY 1 LEVEL;  Surgeon: Reinaldo Meeker, MD;  Location: [facility];  Service: Neurosurgery;  Laterality: N/A;  lumbar four-five left    History  Smoking status  . Former Smoker  Smokeless tobacco  . Not on file    Comment: 50 years ago    History  Alcohol Use No    History   Social History  . Marital Status: Widowed    Spouse Name: N/A    Number of Children: N/A  . Years of Education: N/A   Occupational History  . Not on file.   Social History Main Topics  . Smoking status: Former Games developer  . Smokeless tobacco: Not on file     Comment: 50 years ago  . Alcohol Use: No  . Drug Use: No  . Sexual Activity:    Other Topics Concern  . Not on file    Social History Narrative  . No narrative on file    Allergies  Allergen Reactions  . Demerol [Meperidine] Other (See Comments)    "makes me crazy"  . Lisinopril Diarrhea  . Shellfish Allergy Rash    Current Facility-Administered Medications  Medication Dose Route Frequency Provider Last Rate Last Dose  . 0.9 %  sodium chloride infusion  250 mL Intravenous PRN Marykay Lex, MD      . acetaminophen (TYLENOL) tablet 650 mg  650 mg Oral Q4H PRN Marykay Lex, MD   650 mg at 10/06/13 0736  . ALPRAZolam Prudy Feeler) tablet 0.5 mg  0.5 mg Oral TID PRN Marykay Lex, MD   0.5 mg at 10/06/13 2956  . aspirin EC tablet 81 mg  81 mg Oral Daily Marykay Lex, MD   81 mg at 10/06/13 1002  . atorvastatin (LIPITOR) tablet 80 mg  80 mg Oral q1800 Marykay Lex, MD      . eptifibatide (INTEGRILIN) 75 mg / 100 mL infusion  2 mcg/kg/min Intravenous Continuous Kerin Perna, MD      . heparin ADULT infusion 100 units/mL (25000 units/250 mL)  1,100 Units/hr Intravenous Continuous Marykay Lex, MD 11 mL/hr at 10/06/13 0700 1,100 Units/hr at 10/06/13 0700  . metoprolol tartrate (LOPRESSOR) tablet 25 mg  25 mg Oral BID Eda Paschal Kilroy, PA-C   25 mg at 10/06/13 1002  . morphine 2 MG/ML injection 2 mg  2 mg Intravenous Q1H PRN Marykay Lex, MD   2 mg at 10/06/13 0736  . nitroGLYCERIN (NITROSTAT) SL tablet 0.4 mg  0.4 mg Sublingual Q5 min PRN Raeford Razor, MD   0.4 mg at 10/05/13 2309  . nitroGLYCERIN 0.2 mg/mL in dextrose 5 % infusion  2-200 mcg/min Intravenous Continuous Marykay Lex, MD 1.5 mL/hr at 10/06/13 0700 5 mcg/min at 10/06/13 0700  . ondansetron (ZOFRAN) injection 4 mg  4 mg Intravenous Q6H PRN Marykay Lex, MD      . pantoprazole (PROTONIX) EC tablet 40 mg  40 mg Oral Daily Marykay Lex, MD   40 mg at 10/06/13 1008  . sodium chloride 0.9 % injection 3 mL  3 mL Intravenous Q12H Marykay Lex, MD      . sodium chloride 0.9 % injection 3 mL  3 mL Intravenous PRN Marykay Lex,  MD        Prescriptions prior to admission  Medication Sig Dispense Refill  . ALPRAZolam (XANAX) 0.25 MG tablet Take 0.25 mg by mouth 3 (three) times daily as needed. For anxiety      .  amLODipine (NORVASC) 10 MG tablet Take 10 mg by mouth daily.      . Ascorbic Acid (VITAMIN C) 1000 MG tablet Take 1,000 mg by mouth daily.      Marland Kitchen aspirin EC 81 MG tablet Take 81 mg by mouth daily.      . cholecalciferol (VITAMIN D) 1000 UNITS tablet Take 2,000 Units by mouth daily.      Marland Kitchen esomeprazole (NEXIUM) 40 MG capsule Take 40 mg by mouth daily before breakfast.      . fluticasone (FLONASE) 50 MCG/ACT nasal spray Place 2 sprays into the nose daily as needed. For allergies      . irbesartan-hydrochlorothiazide (AVALIDE) 300-12.5 MG per tablet Take 1 tablet by mouth daily.      . nebivolol (BYSTOLIC) 10 MG tablet Take 10 mg by mouth daily.        No family history on file.  family history-no history of CABG. One son has HOCM treated medically.  Review of Systems:     Cardiac Review of Systems: Y or N  Chest Pain [  Y.   ]  Resting SOB [   ] Exertional SOB  [ Y. ]  Orthopnea [ Y.   ]   Pedal Edema [  in  ]    Palpitations in in  [  ] Syncope  [ no ]   Presyncope [ no    ]  General Review of Systems: [Y] = yes [  ]=no Constitional: recent weight change [  ]; anorexia [  ]; fatigue [  ]; nausea [  ]; night sweats [  ]; fever [  ]; or chills [  ]                                                               Dental: poor dentition[  ]; Last Dentist visit:  total upper and lower dental plates since age 58   Eye : blurred vision [  ]; diplopia [   ]; vision changes [  ];  Amaurosis fugax[  ]; Resp: cough [  ];  wheezing[  ];  hemoptysis[  ]; shortness of breath[  ]; paroxysmal nocturnal dyspnea[  ]; dyspnea on exertion[ Y.   ]; or orthopnea[  ];  GI:  gallstones[  ], vomiting[  ];  dysphagia[  ]; melena[  ];  hematochezia [  ]; heartburn[ Y.   ];   Hx of  Colonoscopy[  ]; GU: kidney stones [  ]; hematuria[   ];   dysuria [  ];  nocturia[  ];  history of     obstruction [  ]; urinary frequency [  ]             Skin: rash, swelling[  ];, hair loss[  ];  peripheral edema[  ];  or itching[  ]; Musculosketetal: myalgias[  ];  joint swelling[  ];  joint erythema[  ];  joint pain[  ];  back pain[  ];  Heme/Lymph: bruising[  no  ];  bleeding[  ];  anemia[  ];  Neuro: TIA[  ];  headaches[  ];  stroke[  ];  vertigo[  ];  seizures[  ];   paresthesias[  Y.-right leg following lumbar surgery earlier this year  ];  difficulty walking[ no   ];  Psych:depression[  ]; anxiety[ Y.-very claustrophobic   ];  Endocrine: diabetes[  ];  thyroid dysfunction[  ];  Immunizations: Flu [  ]; Pneumococcal[  ];  Other:  Physical Exam: BP 134/69  Pulse 74  Temp(Src) 97.8 F (36.6 C) (Oral)  Resp 21  Ht 6\' 5"  (1.956 m)  Wt 250 lb (113.399 kg)  BMI 29.64 kg/m2  SpO2 95%  General appearance - obese Caucasian male in ICU with balloon pump in no distress accompanied by daughters   HEENT normocephalic edentulous with upper and lower dental plates pupils equal  neck-no JVD mass or bruit Lymphatics-no palpable adenopathy in the neck or supraclavicular fossa Thoracic-clear breath sounds, no chest tenderness or deformity Cardiac-sinus rhythm without gallop or murmur Abdomen-obese not tender, no pulsatile mass Extremities-no clubbing cyanosis edema or tenderness, no hematoma at right radial artery catheterization site Vascular-palpable pulses in all extremities Neurologic-alert and responsive, no focal motor deficit   Diagnostic Studies & Laboratory data:    Carotid duplex ultrasound demonstrates no significant stenosis Coronary angiogram  demonstrates severe single-vessel CAD 2-D echocardiogram pending  Recent Radiology Findings:   No results found.  chest x-ray pending   Recent Lab Findings: Lab Results  Component Value Date   WBC 10.3 10/06/2013   HGB 13.1 10/06/2013   HCT 37.4* 10/06/2013   PLT 247 10/06/2013    GLUCOSE 118* 10/06/2013   NA 137 10/06/2013   K 3.8 10/06/2013   CL 99 10/06/2013   CREATININE 1.07 10/06/2013   BUN 19 10/06/2013   CO2 30 10/06/2013      Assessment / Plan:     Probable completed anterior MI with troponin greater than 20. Brilinta load at time of catheterization. Hemodynamically stable on intra-aortic balloon pump Plan surgical coronary revascularization to LAD and diagonal after patient recovers from MI and after some washout of the  ticagrelor. Procedure and plan of care discussed with patient and family and they understand and agree.  CABG Tuesday, November 4, 7 AM       @ME1 @ 10/06/2013 11:15 AM

## 2013-10-06 NOTE — Progress Notes (Signed)
3cc air removed from TR band (8cc remain). No signs of bleeding/oozing. Will continue to monitor.

## 2013-10-06 NOTE — H&P (Signed)
Pt. Seen and examined. Agree with the NP/PA-C note as written.  79 yo male with acute onset SSCP and anterior ST elevation, s/p balloon angioplasty by Dr. Herbie Baltimore - now on IABP at 1:1.  No chest pain. CT surgery to evaluate for possible bypass given extensive LAD disease and large diagonal system which is diseased.  Chrystie Nose, MD, [facility] Attending Cardiologist [facility]

## 2013-10-06 NOTE — Progress Notes (Signed)
3cc air removed from TR band (11cc remain). No signs of bleeding/oozing. Will continue to monitor.

## 2013-10-06 NOTE — Progress Notes (Signed)
3cc air removed from TR band (5cc remain). No signs of bleeding/oozing. Will continue to monitor.

## 2013-10-06 NOTE — Progress Notes (Addendum)
Pre-op Cardiac Surgery  Carotid Findings:  1-39% ICA stenosis.  Vertebral artery flow is antegrade.  Upper Extremity Right Left  Brachial Pressures 109 Triphasic IV site Triphasic  Radial Waveforms Triphasic Triphasic  Ulnar Waveforms Triphasic Triphasic  Palmar Arch (Allen's Test) Normal Normal   Findings:  Doppler waveforms remained normal bilaterally with both radial and ulnar compressions    Lower  Extremity Right Left  Dorsalis Pedis    Anterior Tibial    Posterior Tibial    Ankle/Brachial Indices      Findings:   Palpable pedal pulses bilaterally   [employer], RVS 12:25 pm 10/08/2013

## 2013-10-06 NOTE — Progress Notes (Signed)
  Echocardiogram 2D Echocardiogram has been performed.  Stephanye Finnicum FRANCES 10/06/2013, 12:40 PM

## 2013-10-06 NOTE — Progress Notes (Signed)
3cc air removed from TR band (14cc remain).  No signs of bleeding/oozing.  Will continue to monitor.

## 2013-10-06 NOTE — Plan of Care (Signed)
Problem: Consults Goal: Cardiac Surgery Patient Education ( See Patient Education module for education specifics.) Outcome: Completed/Met Date Met:  10/06/13 Discussed with Doctor.  Questions answered.  Refused to watch video about surgery at this time, patient and family.  Problem: Phase I Progression Outcomes Goal: Verbalized level of anxiety/depression Outcome: Completed/Met Date Met:  10/06/13 Takes xanax for anxiety issues.  Also has claustrophobia

## 2013-10-06 NOTE — Progress Notes (Signed)
3cc air removed from TR band (2cc remain). No signs of bleeding/oozing. Will continue to monitor.

## 2013-10-06 NOTE — CV Procedure (Addendum)
CARDIAC CATHETERIZATION AND PERCUTANEOUS INTERVENTION REPORT  NAME:  Antonio Bernard   MRN: [id number] DOB:  06-21-1933   ADMIT DATE: 10/05/2013 Procedure Date: 10/06/2013  INTERVENTIONAL CARDIOLOGIST: Marykay Lex, M.D., MS PRIMARY CARE PROVIDER: Thayer Headings, MD PRIMARY CARDIOLOGIST: Marykay Lex, MD  PATIENT:  Antonio Bernard is a 79 y.o. male with only a medical history of hypertension, likely OSA with obesity (but not on treatment), and report of AAA along with anxiety. He was in his usual state of health until after playing a round of golf on Thursday, October 30. Shortly after playing he developed substernal chest pressure a roughly 6/10 that lasted several minutes. Since then he has had intermittent episodes of similar symptoms, worsened with exertion. He then had a severe episode earlier this evening it did not go away. The initial episode in a roughly 6 PM then had waxing and waning symptoms until it became worse somewhere around 10 to 10:30 PM. He called his daughter who then called EMS. He is brought to the emergency room I don't have 3-4 mm (and one lead roughly 5) delayed as elevations in leads V2-the 6 as well as 1 and aVL. Q waves are also noted in V2 and V3. There was reciprocal ST depressions in 2 and aVF. He was brought emergently to the cardiac catheterization lab where he was evaluated. Upon arrival his pain level is roughly 3/10 after receiving nitroglycerin and morphine.  PRE-OPERATIVE DIAGNOSIS:    Anterior STEMI, perhaps subacute  PROCEDURES PERFORMED:    LEFT HEART CATHETERIZATION WITH CORONARY ANGIOGRAPHY  PERCUTANEOUS CORONARY ANGIOPLASTY (ALONE) OF THE ~OSTIAL TO MID LAD WITH A 2.5 MM AND 3.0 MM BALLOON  PLACEMENT OF AN INTRA-AORTIC BALLOON PUMP  PROCEDURE:Consent:  Risks of procedure as well as the alternatives and risks of each were explained to the patient.  Verbal consent for procedure was obtained. Unable to obtain written onsent because of  emergent medical necessity.  PROCEDURE: The patient was brought to the 2nd Floor [facility] in the fasting state and prepped and draped in the usual sterile fashion for Right groin or radial access. A modified Allen's test with plethysmography was performed, revealing excellent Ulnar artery collateral flow.  Sterile technique was used including antiseptics, cap, gloves, gown, hand hygiene, mask and sheet.  Skin prep: Chlorhexidine.  Time Out: Verified patient identification, verified procedure, site/side was marked, verified correct patient position, special equipment/implants available, medications/allergies/relevent history reviewed, required imaging and test results available.  Performed  Access: Right Radial Artery; 6 Fr Sheath -- Seldinger technique (Micropuncture Kit)  Radial Cocktail (intra-arterial via sheath, 10 mL), IV Angiomax Diagnostic:  5 [place] JR 4, 6 [place] XB LAD 3.5 and JL4 Guide, Angled Pigtail  Right  Coronary Artery Angiography: JR 4  Left Coronary Artery Angiography: Initial images with XB LAD, exchanged for a JL4 guide  LV Hemodynamics (LV Gram): Angled pigtail  TR Band:  0145 Hours, 17 mL air  MEDICATIONS:  Anesthesia:  Local Lidocaine 2 ml; 18 for RFA Access - IABP  Sedation:  1 mg IV Versed, 100 mcg IV fentanyl ;   Omnipaque Contrast: 235 ml  Anticoagulation:  Angiomax Bolus & drip  Anti-Platelet Agent:  Brilinta 180 mg, after the extensive LAD lesion noted -- Integrilin single bolus with drip initiated  IC Nitroglycerin: 200 mcg Radial Cocktail: 5 mg Verapamil, 400 mcg NTG, 2 ml 2% Lidocaine in 10 ml NS  Hemodynamics:  Central Aortic / Mean Pressures: 99/66 mmHg; 81 mmHg opening  pressures --> final pressures were 107/69 mmHg;  Left Ventricular Pressures / EDP: 103/26 mmHg; 40 mmHg  Left Ventriculography: Not assess due to elevated LVEDP, and concern for possible LV apical thrombus  Coronary Anatomy:  Left Main:  Very short, large-caliber vessel which bifurcates into the Circumflex, and LAD that is 100% occluded proximally. LAD angiography post-initial PTCA:  LAD: After the initial 100% occlusion, there is a tubular 80% stenosis followed by tandem 70-80/90% stenoses coursing well into the mid vessel. At least 2 septal perforators including a major septal perforator trunk along with a major First Diagonal Branch (D1) are involved. Beyond the diagonal, and septal perforator trunk, the vessel then began to normalize, but is diffusely spasm downstream as it wraps the apex.  D1: Moderate caliber, major diagonal branch that gives off at least 3 branches along the anterolateral wall. In the mid segment there is a roughly 60% stenosis, however the ostium of this vessel is involved with extensive LAD disease. Post-PTCA there is evidence of roughly 90% stenosis in this 90 takeoff. Unable to wire.  Left Circumflex: Large-caliber, gives off very proximal small branch before bifurcating in the AV groove into the lateral OM 1 that bifurcates distally, and a smaller OM 2. There is a very small AV groove branch that comes off OM 2  OM1: Large-caliber vessel bifurcating distally along the inferolateral wall. Roughly 40% proximal lesion, the remainder the vessel is free of significant disease. Quite tortuous.  OM 2: Moderate caliber vessel bifurcating distally, could be considered posterolateral versus OM. Minimal luminal irregularities.   RCA: Large-caliber, dominant vessel that gives off 2 large marginal branches one of which actually appears to course almost in tandem with a small PDA. The  Right Posterior AV Groove Branch (RPAV) is a major distal branch giving rise to 2 Posterolateral Lateral branches. Minimal luminal irregularities  After initial angiography, the clear-cut culprit lesion was the near ostial LAD. There did appear to be an adequate landing zone for stent in the proximal vessel, and plans are made to  proceed with PTCA-PCI of the culprit lesion.. Therefore the patient was loaded with Brilinta.  Percutaneous Coronary Intervention:   Guide: After unsuccessful attempt to wire the lesion using the 6 Fr XB LAD 3.5 guide, this is exchanged for a  6 FrJL4 guide     Guidewire: Initial attempts to wire the vessel using a pro-water wire were unsuccessful, this is exchanged for a Cougar Wire that was used to successfully advanced across the LAD lesion. Do to the very short left main, and poor guide support, the initial wiring of the lesion was extremely difficult. With initial guide catheter and balloon support, I was unable to advance the wire into the LAD, but was able to do so after changing for a second guide catheter. This caused at least a 5-10 minute delay initial device deployment. Predilation Balloon: Trek 2.5 mm x 12 mm;   Initial deployment 8 Atm x 30 Sec, initial angioplasty did reveal restoration of TIMI 2 flow distally. This is partly due to 2 what appeared to be very stunned mid to distal/apical anterior wall.  3-Additional inflations from proximal to mid vessel: 10 Atm x 45 Sec,   At this point a decision was made to only do balloon angioplasty with plans for CT surgical consultation for CABG based on the extent of disease that would require close to 40-44 mm of stenting and would jail/potentially jeopardize the major diagonal branch that has a 90 takeoff. Scoring  Balloon: AngioSculpt 3.0 mm x 10 mm; inflations were only made in the proximal third of the stenosed segment, as the balloon would not advance beyond that point.  Initial inflation 8 Atm x 30 Sec,   2 additional inflations: 10 Atm x 45 Sec  Final-dilation Balloon: Emerge Monorail 3.0 mm x 20 mm;   Proximal inflation: 10 Atm x 60 Sec,  Mid and Distal inflations: 8 Atm x 60 Sec x 2  Post deployment angiography in multiple views, with and without guidewire in place revealed notable improvement in the downstream flow reducing  the 100% lesion to roughly 50%, but with residual lesions of 60-80% noted well into the mid LAD.  There was TIMI 2-3 flow, but no evidence of dissection or perforation.  Intra-Aortic Balloon Pump: Based on the significant elevated EDP and poor downstream flow in the LAD, the decision was made to place an Intra-Aortic Balloon Pump (IABP).  Access: The Right Common Femoral Artery was located using tactile and fluoroscopic guidance.  Access was obtained using the modified Seldinger technique with fluoroscopic guidance. The 7 French balloon pump sheath was placed.  Bloom Pump Placement: The balloon pump was advanced under fluoroscopic guidance into the proximal descending aorta.  All lines purged, aspirated and flushed.  Under fluoroscopic guidance, the balloon was filled an appropriate location was confirmed.  The balloon pump wasn't initiated at a 1:1 ratio with excellent augmentation of pressure.  PATIENT DISPOSITION:    The patient was transferred to the CT Surgical ICU in a hemodynamicaly stable, chest pain free condition.  The patient tolerated the procedure well, and there were no complications.  EBL:   < 20 ml  The patient was stable before, during, and after the procedure.  POST-OPERATIVE DIAGNOSIS:    Anterior STEMI, based on ECG with Q waves already present, this is likely a subacute presentation with a proximally occluded LAD. Unfortunately, following ministration the Brilinta, initial balloon angioplasty revealed a very long, extensive segment of stenoses ranging from 50-80% well into the mid vessel. A large major diagonal branch was also revealed to have significant ostial stenosis as well as moderate disease in the body of the vessel.   Based on the length of the disease segment in the involved diagonal branch that has a 90 takeoff from the native vessel that was only the wire, I felt the best to consider 2 vessel CABG with LIMA to LAD and vein graft to the diagonal as opposed to  placing a long ostial to mid LAD stent that could potentially jeopardize a major diagonal branch that perfuses the entire anterolateral wall.  Balloon angioplasty alone was performed to the entire diseased segment to restore TIMI 2 to TIMI 3 flow. After flow was reestablished in the native LAD, the diagonal did appear to have a roughly 90% stenosis.  Successful placement of   Electrocardiogram was not performed due to the severely elevated LVEDP of roughly 40 mmHg. This plus the appearance of very poor anterior wall motion on angiography led to concern for possible left ventricular thrombus  Hemodynamically stable with blood pressures ranging from the high 90s systolic to 110 systolic in the Cath Lab. Heart rate was stable in the 80s and 90s  PLAN OF CARE:  Admit to ICU, I have contacted Cardiac Surgery (Dr. Donata Clay) who will see the patient in the morning.  Continue IV Integrilin and start IV heparin 2 hours post Angiomax discontinuation due to the IABP.  We'll continue IABP based on elevated LVEDP  and angina prevention.  We'll add statin, convert his beta blocker to metoprolol for easier titration.  Hold ARB for now, pending evaluation of his renal function post cath.  I spent well over 45 minutes discussing the patient's findings and my recommendations with the family.  Marykay Lex, M.D., M.S. [facility] [address] [place], [place]  [address]  [phone]  10/06/2013 11:06 AM  This note was delayed due to system down time of the Epic system.

## 2013-10-07 ENCOUNTER — Inpatient Hospital Stay (HOSPITAL_COMMUNITY): Payer: Medicare Other

## 2013-10-07 ENCOUNTER — Other Ambulatory Visit: Payer: Self-pay | Admitting: *Deleted

## 2013-10-07 DIAGNOSIS — Z01810 Encounter for preprocedural cardiovascular examination: Secondary | ICD-10-CM

## 2013-10-07 DIAGNOSIS — I2510 Atherosclerotic heart disease of native coronary artery without angina pectoris: Secondary | ICD-10-CM

## 2013-10-07 DIAGNOSIS — F411 Generalized anxiety disorder: Secondary | ICD-10-CM

## 2013-10-07 DIAGNOSIS — Z9861 Coronary angioplasty status: Secondary | ICD-10-CM

## 2013-10-07 LAB — POCT I-STAT, CHEM 8
Calcium, Ion: 1.23 mmol/L (ref 1.13–1.30)
Chloride: 99 mEq/L (ref 96–112)
Creatinine, Ser: 1.3 mg/dL (ref 0.50–1.35)
Glucose, Bld: 124 mg/dL — ABNORMAL HIGH (ref 70–99)
Hemoglobin: 12.6 g/dL — ABNORMAL LOW (ref 13.0–17.0)
TCO2: 28 mmol/L (ref 0–100)

## 2013-10-07 LAB — COMPREHENSIVE METABOLIC PANEL
ALT: 97 U/L — ABNORMAL HIGH (ref 0–53)
AST: 352 U/L — ABNORMAL HIGH (ref 0–37)
Albumin: 3 g/dL — ABNORMAL LOW (ref 3.5–5.2)
Alkaline Phosphatase: 41 U/L (ref 39–117)
BUN: 29 mg/dL — ABNORMAL HIGH (ref 6–23)
CO2: 29 mEq/L (ref 19–32)
Calcium: 8.3 mg/dL — ABNORMAL LOW (ref 8.4–10.5)
Chloride: 100 mEq/L (ref 96–112)
Creatinine, Ser: 1.27 mg/dL (ref 0.50–1.35)
GFR calc Af Amer: 61 mL/min — ABNORMAL LOW (ref >90)
GFR calc non Af Amer: 53 mL/min — ABNORMAL LOW (ref >90)
Glucose, Bld: 126 mg/dL — ABNORMAL HIGH (ref 70–99)
Potassium: 3.2 mEq/L — ABNORMAL LOW (ref 3.5–5.1)
Sodium: 137 mEq/L (ref 135–145)
Total Bilirubin: 0.8 mg/dL (ref 0.3–1.2)
Total Protein: 5.5 g/dL — ABNORMAL LOW (ref 6.0–8.3)

## 2013-10-07 LAB — CBC
HCT: 33 % — ABNORMAL LOW (ref 39.0–52.0)
Hemoglobin: 11.6 g/dL — ABNORMAL LOW (ref 13.0–17.0)
MCH: 31.4 pg (ref 26.0–34.0)
MCHC: 35.2 g/dL (ref 30.0–36.0)
MCV: 89.2 fL (ref 78.0–100.0)
Platelets: 183 10*3/uL (ref 150–400)
RBC: 3.7 MIL/uL — ABNORMAL LOW (ref 4.22–5.81)
RDW: 13.7 % (ref 11.5–15.5)
WBC: 9.6 10*3/uL (ref 4.0–10.5)

## 2013-10-07 LAB — POCT I-STAT 3, ART BLOOD GAS (G3+)
Acid-Base Excess: 3 mmol/L — ABNORMAL HIGH (ref 0.0–2.0)
O2 Saturation: 91 %
Patient temperature: 97.8
TCO2: 29 mmol/L (ref 0–100)
pCO2 arterial: 41.7 mmHg (ref 35.0–45.0)
pH, Arterial: 7.433 (ref 7.350–7.450)

## 2013-10-07 LAB — PRO B NATRIURETIC PEPTIDE: Pro B Natriuretic peptide (BNP): 3092 pg/mL — ABNORMAL HIGH (ref 0–450)

## 2013-10-07 LAB — HEPARIN LEVEL (UNFRACTIONATED): Heparin Unfractionated: 0.2 IU/mL — ABNORMAL LOW (ref 0.30–0.70)

## 2013-10-07 LAB — CK TOTAL AND CKMB (NOT AT ARMC)
CK, MB: 53.1 ng/mL (ref 0.3–4.0)
Relative Index: 3 — ABNORMAL HIGH (ref 0.0–2.5)
Total CK: 1770 U/L — ABNORMAL HIGH (ref 7–232)

## 2013-10-07 LAB — TSH
TSH: 1.251 u[IU]/mL (ref 0.350–4.500)
TSH: 1.305 u[IU]/mL (ref 0.350–4.500)

## 2013-10-07 LAB — PROTIME-INR
INR: 1.03 (ref 0.00–1.49)
Prothrombin Time: 13.3 seconds (ref 11.6–15.2)

## 2013-10-07 LAB — PREPARE RBC (CROSSMATCH)

## 2013-10-07 LAB — APTT: aPTT: 75 seconds — ABNORMAL HIGH (ref 24–37)

## 2013-10-07 MED ORDER — VANCOMYCIN HCL 10 G IV SOLR
1250.0000 mg | INTRAVENOUS | Status: DC
  Filled 2013-10-07: qty 1250

## 2013-10-07 MED ORDER — ALBUTEROL SULFATE (5 MG/ML) 0.5% IN NEBU
2.5000 mg | INHALATION_SOLUTION | Freq: Once | RESPIRATORY_TRACT | Status: AC
  Administered 2013-10-07: 2.5 mg via RESPIRATORY_TRACT

## 2013-10-07 MED ORDER — POTASSIUM CHLORIDE 10 MEQ/50ML IV SOLN: 10.0000 meq | INTRAVENOUS | Status: DC

## 2013-10-07 MED ORDER — INSULIN REGULAR HUMAN 100 UNIT/ML IJ SOLN
INTRAMUSCULAR | Status: AC
  Administered 2013-10-08: 1 [IU]/h via INTRAVENOUS
  Filled 2013-10-07: qty 1

## 2013-10-07 MED ORDER — POTASSIUM CHLORIDE CRYS ER 20 MEQ PO TBCR
40.0000 meq | EXTENDED_RELEASE_TABLET | Freq: Once | ORAL | Status: AC
  Administered 2013-10-07: 40 meq via ORAL
  Filled 2013-10-07: qty 2

## 2013-10-07 MED ORDER — DEXTROSE 5 % IV SOLN
1.5000 g | INTRAVENOUS | Status: AC
  Administered 2013-10-08: 1.5 g via INTRAVENOUS
  Administered 2013-10-08: .75 g via INTRAVENOUS
  Filled 2013-10-07: qty 1.5

## 2013-10-07 MED ORDER — CHLORHEXIDINE GLUCONATE 4 % EX LIQD
60.0000 mL | Freq: Once | CUTANEOUS | Status: AC
  Administered 2013-10-08: 4 via TOPICAL
  Filled 2013-10-07: qty 60

## 2013-10-07 MED ORDER — CHLORHEXIDINE GLUCONATE 4 % EX LIQD
60.0000 mL | Freq: Once | CUTANEOUS | Status: AC
  Administered 2013-10-07: 4 via TOPICAL
  Filled 2013-10-07: qty 60

## 2013-10-07 MED ORDER — NITROGLYCERIN IN D5W 200-5 MCG/ML-% IV SOLN
2.0000 ug/min | INTRAVENOUS | Status: DC
  Filled 2013-10-07: qty 250

## 2013-10-07 MED ORDER — ALPRAZOLAM 0.25 MG PO TABS
0.2500 mg | ORAL_TABLET | ORAL | Status: DC | PRN
  Administered 2013-10-07 (×3): 0.5 mg via ORAL
  Filled 2013-10-07 (×3): qty 2

## 2013-10-07 MED ORDER — METOPROLOL TARTRATE 12.5 MG HALF TABLET
12.5000 mg | ORAL_TABLET | Freq: Once | ORAL | Status: AC
  Administered 2013-10-08: 12.5 mg via ORAL
  Filled 2013-10-07: qty 1

## 2013-10-07 MED ORDER — BISACODYL 5 MG PO TBEC
5.0000 mg | DELAYED_RELEASE_TABLET | Freq: Once | ORAL | Status: AC
  Administered 2013-10-07: 5 mg via ORAL
  Filled 2013-10-07: qty 1

## 2013-10-07 MED ORDER — DOPAMINE-DEXTROSE 3.2-5 MG/ML-% IV SOLN
2.0000 ug/kg/min | INTRAVENOUS | Status: AC
  Administered 2013-10-08: 3 ug/kg/min via INTRAVENOUS
  Filled 2013-10-07: qty 250

## 2013-10-07 MED ORDER — HEPARIN SODIUM (PORCINE) 1000 UNIT/ML IJ SOLN
INTRAMUSCULAR | Status: DC
  Filled 2013-10-07: qty 30

## 2013-10-07 MED ORDER — DEXMEDETOMIDINE HCL IN NACL 400 MCG/100ML IV SOLN
0.1000 ug/kg/h | INTRAVENOUS | Status: AC
  Administered 2013-10-08: 0.2 ug/kg/h via INTRAVENOUS
  Filled 2013-10-07: qty 100

## 2013-10-07 MED ORDER — MAGNESIUM SULFATE 50 % IJ SOLN
40.0000 meq | INTRAMUSCULAR | Status: DC
  Filled 2013-10-07: qty 10

## 2013-10-07 MED ORDER — PHENYLEPHRINE HCL 10 MG/ML IJ SOLN
30.0000 ug/min | INTRAVENOUS | Status: DC
  Filled 2013-10-07: qty 2

## 2013-10-07 MED ORDER — EPINEPHRINE HCL 1 MG/ML IJ SOLN
0.5000 ug/min | INTRAVENOUS | Status: DC
  Filled 2013-10-07: qty 4

## 2013-10-07 MED ORDER — POTASSIUM CHLORIDE 2 MEQ/ML IV SOLN
80.0000 meq | INTRAVENOUS | Status: DC
  Filled 2013-10-07: qty 40

## 2013-10-07 MED ORDER — SODIUM CHLORIDE 0.9 % IV SOLN
INTRAVENOUS | Status: AC
  Administered 2013-10-08: 70 mL/h via INTRAVENOUS
  Filled 2013-10-07: qty 40

## 2013-10-07 MED ORDER — DEXTROSE 5 % IV SOLN
750.0000 mg | INTRAVENOUS | Status: DC
  Filled 2013-10-07: qty 750

## 2013-10-07 MED ORDER — DIAZEPAM 5 MG PO TABS
5.0000 mg | ORAL_TABLET | Freq: Once | ORAL | Status: AC
  Administered 2013-10-08: 5 mg via ORAL
  Filled 2013-10-07: qty 1

## 2013-10-07 MED ORDER — VANCOMYCIN HCL 10 G IV SOLR
1500.0000 mg | INTRAVENOUS | Status: AC
  Administered 2013-10-08: 1500 mg via INTRAVENOUS
  Filled 2013-10-07: qty 1500

## 2013-10-07 MED ORDER — TEMAZEPAM 7.5 MG PO CAPS: 15.0000 mg | ORAL_CAPSULE | Freq: Once | ORAL | Status: AC | PRN

## 2013-10-07 MED ORDER — PLASMA-LYTE 148 IV SOLN
INTRAVENOUS | Status: AC
  Administered 2013-10-08: 10:00:00
  Filled 2013-10-07: qty 2.5

## 2013-10-07 MED ORDER — HEPARIN (PORCINE) IN NACL 100-0.45 UNIT/ML-% IJ SOLN
1300.0000 [IU]/h | INTRAMUSCULAR | Status: DC
  Administered 2013-10-07: 1300 [IU]/h via INTRAVENOUS
  Filled 2013-10-07 (×3): qty 250

## 2013-10-07 MED FILL — Sodium Chloride IV Soln 0.9%: INTRAVENOUS | Qty: 50 | Status: AC

## 2013-10-07 NOTE — Progress Notes (Signed)
1 Day Post-Op Procedure(s) (LRB): LEFT HEART CATH (Right) Subjective: Ant MI IABP No chest pain overnight Echo with EF.30, no MR Objective: Vital signs in last 24 hours: Temp:  [97.6 F (36.4 C)-98.6 F (37 C)] 98.6 F (37 C) (11/03 0804) Pulse Rate:  [65-79] 73 (11/03 1000) Cardiac Rhythm:  [-] Normal sinus rhythm (11/03 0800) Resp:  [10-32] 20 (11/03 1000) BP: (94-135)/(38-87) 107/47 mmHg (11/03 1000) SpO2:  [90 %-98 %] 97 % (11/03 1000)  Hemodynamic parameters for last 24 hours:  nsr, afebrile  Intake/Output from previous day: 11/02 0701 - 11/03 0700 In: 1656.6 [P.O.:600; I.V.:1056.6] Out: 1100 [Urine:1100] Intake/Output this shift: Total I/O In: 132.3 [I.V.:132.3] Out: 50 [Urine:50]  Lungs clear extrem warm  Lab Results:  Recent Labs  10/06/13 0400 10/07/13 0400  WBC 10.3 9.6  HGB 13.1 11.6*  HCT 37.4* 33.0*  PLT 247 183   BMET:  Recent Labs  10/06/13 0400 10/07/13 0400  NA 137 137  K 3.8 3.2*  CL 99 100  CO2 30 29  GLUCOSE 118* 126*  BUN 19 29*  CREATININE 1.07 1.27  CALCIUM 9.1 8.3*    PT/INR:  Recent Labs  10/07/13 0400  LABPROT 13.3  INR 1.03   ABG    Component Value Date/Time   PHART 7.433 10/06/2013 1029   HCO3 28.0* 10/06/2013 1029   TCO2 29 10/06/2013 1029   O2SAT 91.0 10/06/2013 1029   CBG (last 3)  No results found for this basename: GLUCAP,  in the last 72 hours  Assessment/Plan: S/P Procedure(s) (LRB): LEFT HEART CATH (Right) Plan multivessel CABG in am Procedure,benefits and risks d/w patient who understands and agrees with plan  Check P2 Y12 in am after integrilin off 4 hours   LOS: 2 days    VAN TRIGT III,Lizbett Garciagarcia 10/07/2013

## 2013-10-07 NOTE — Progress Notes (Signed)
   Subjective:  Much better on increased Xanax- "finally got some rest"  Objective:  Vital Signs in the last 24 hours: Temp:  [97.6 F (36.4 C)-98.6 F (37 C)] 98.6 F (37 C) (11/03 0804) Pulse Rate:  [65-79] 73 (11/03 1000) Resp:  [10-32] 20 (11/03 1000) BP: (94-135)/(38-91) 108/41 mmHg (11/03 0900) SpO2:  [90 %-98 %] 97 % (11/03 1000)  Intake/Output from previous day:  Intake/Output Summary (Last 24 hours) at 10/07/13 1018 Last data filed at 10/07/13 0900  Gross per 24 hour  Intake 1382.98 ml  Output   1150 ml  Net 232.98 ml    Physical Exam: General appearance: alert, cooperative and no distress Lungs: decreased breath sounds Heart: regular rate and rhythm and IABP, 1:1 Abd: soft, NT, ND, NABS Ext: no real edema, R foot with brisk pulses.   Rate: 74  Rhythm: normal sinus rhythm  Lab Results:  Recent Labs  10/06/13 0400 10/07/13 0400  WBC 10.3 9.6  HGB 13.1 11.6*  PLT 247 183    Recent Labs  10/06/13 0400 10/07/13 0400  NA 137 137  K 3.8 3.2*  CL 99 100  CO2 30 29  GLUCOSE 118* 126*  BUN 19 29*  CREATININE 1.07 1.27    Recent Labs  10/06/13 1630 10/06/13 2130  TROPONINI >20.00* >20.00*    Recent Labs  10/07/13 0400  INR 1.03    Imaging: Imaging results have been reviewed  Cardiac Studies:  Assessment/Plan:   Principal Problem:   STEMI of anterior wall,  10/05/13 Active Problems:   S/P urgent LAD PTCA 10/06/13 with IABP for LAD perfusion   Essential hypertension   Obesity (BMI 35.0-39.9 without comorbidity)   Anxiety, claustrophobia   DJD  lumbar- surg August 2013   Sleep apnea- C-pap intol   AAA (abdominal aortic aneurysm)-hx of a "small" AAA found July 2013 (nothing in EPIC)   PLAN: CABG in am. K+ ordered.  Corine Shelter PA-C Beeper [phone] 10/07/2013, 10:18 AM  I have seen and evaluated the patient this PM along with Corine Shelter, PA. I agree with his findings, examination as well as impression recommendations.  No  further CP - anxiety better controlled.  Remains on IV NTG. Continues on IABP @ 1:1, on Heparin & Integrelin. -- Dr. Donata Clay plans to check p2Y12 assay after holdings Integrelin.   On statin & BB.  Seems stable - on IV Lasix, but acutally net +; I suspect the IABP has helped with the LVEDP elevation.  May be less volume related than simply LV Failure.  Plan is CABG in AM.  Appreciate Dr. Zenaida Niece Trigt's promp consultation & care.  We will follow.  Marykay Lex, M.D., M.S. [facility] [address] [place], [place]  [address]  [phone] Pager # [phone] 10/07/2013 2:55 PM

## 2013-10-07 NOTE — Progress Notes (Addendum)
ANTICOAGULATION CONSULT NOTE - Follow Up Consult  Pharmacy Consult for Heparin Indication: s/p STEMI, CAD pending CABG, IABP  Allergies  Allergen Reactions  . Demerol [Meperidine] Other (See Comments)    "makes me crazy"  . Lisinopril Diarrhea  . Shellfish Allergy Rash    Patient Measurements: Height: 6\' 1"  (185.4 cm) Weight: 250 lb (113.399 kg) IBW/kg (Calculated) : 79.9 Heparin Dosing Weight: 112 kg  Vital Signs: Temp: 98.6 F (37 C) (11/03 0804) Temp src: Axillary (11/03 0804) BP: 107/47 mmHg (11/03 1000) Pulse Rate: 73 (11/03 1000)  Labs:  Recent Labs  10/05/13 2300 10/06/13 0400 10/06/13 1030 10/06/13 1530 10/06/13 1630 10/06/13 1855 10/06/13 2130 10/07/13 0400 10/07/13 0854  HGB 13.9 13.1  --   --   --   --   --  11.6*  --   HCT 39.7 37.4*  --   --   --   --   --  33.0*  --   PLT 266 247  --   --   --   --   --  183  --   APTT  --   --   --   --   --   --   --  75*  --   LABPROT  --   --   --   --   --   --   --  13.3  --   INR  --   --   --   --   --   --   --  1.03  --   HEPARINUNFRC  --   --  0.11*  --   --  0.24*  --   --  0.20*  CREATININE 1.23 1.07  --   --   --   --   --  1.27  --   CKTOTAL  --  7729*  --   --   --   --   --  1770*  --   CKMB  --  328.1*  --   --   --   --   --  53.1*  --   TROPONINI <0.30 >20.00* >20.00* >20.00* >20.00*  --  >20.00*  --   --     Estimated Creatinine Clearance: 64.3 ml/min (by C-G formula based on Cr of 1.27).   Medications:  Scheduled:  . [START ON 10/08/2013] aminocaproic acid (AMICAR) for OHS   Intravenous To OR  . aspirin EC  81 mg Oral Daily  . atorvastatin  80 mg Oral q1800  . bisacodyl  5 mg Oral Once  . [START ON 10/08/2013] cefUROXime (ZINACEF)  IV  750 mg Intravenous To OR  . [START ON 10/08/2013] dexmedetomidine  0.1-0.7 mcg/kg/hr Intravenous To OR  . [START ON 10/08/2013] DOPamine  2-20 mcg/kg/min Intravenous To OR  . [START ON 10/08/2013] epinephrine  0.5-20 mcg/min Intravenous To OR  . furosemide   40 mg Intravenous BID  . [START ON 10/08/2013] heparin-papaverine-plasmalyte irrigation   Irrigation To OR  . [START ON 10/08/2013] heparin 30,000 units/NS 1000 mL solution for CELLSAVER   Other To OR  . [START ON 10/08/2013] insulin (NOVOLIN-R) infusion   Intravenous To OR  . [START ON 10/08/2013] magnesium sulfate  40 mEq Other To OR  . metoprolol tartrate  25 mg Oral BID  . [START ON 10/08/2013] nitroGLYCERIN  2-200 mcg/min Intravenous To OR  . pantoprazole  40 mg Oral Daily  . [START ON 10/08/2013] phenylephrine (NEO-SYNEPHRINE) Adult infusion  30-200 mcg/min  Intravenous To OR  . [START ON 10/08/2013] potassium chloride  80 mEq Other To OR  . sodium chloride  3 mL Intravenous Q12H   Infusions:  . eptifibatide 2 mcg/kg/min (10/07/13 1000)  . heparin 1,300 Units/hr (10/07/13 1000)  . nitroGLYCERIN 10 mcg/min (10/07/13 1000)    Assessment: Heparin level = 0.2 on IV heparin infusion @ 1300 units/hr in this 79 yo M s/p STEMI & s/p emergent LAD PTCA. At cath he had LAD, large Dx1, and SP disease. Post PTCA he was placed on an IABP for LAD perfusion.  Plan for CABG Tuesday 11/4.  Pt also continues on Integrelin at 2 mcg/kg/min. PLTC =183K, Hgb = 11.6. No bleeding reported. Currently heparin level is at goal on 1300 units/hr.     Goal of Therapy:  Heparin level 0.2-0.5 units/ml Monitor platelets by anticoagulation protocol: Yes   Plan:   -No heparin rate changes needed.  Heparin drip to stop 11/4 @ 05:59 prior to CABG in AM. -Daily heparin level and CBC  Noah Delaine, RPh Clinical Pharmacist Pager: [phone]  10/07/2013 10:54 AM

## 2013-10-07 NOTE — Care Management Note (Addendum)
    Page 1 of 2   10/24/2013     4:10:52 PM   CARE MANAGEMENT NOTE 10/24/2013  Patient:  Antonio Bernard, Antonio Bernard   Account Number:  [id number]  Date Initiated:  10/07/2013  Documentation initiated by:  MAYO,HENRIETTA  Subjective/Objective Assessment:   adm with dx of STEMI, lives alone    PCP  Dr Thayer Headings     Action/Plan:   Anticipated DC Date:  10/25/2013   Anticipated DC Plan:  IP REHAB FACILITY  In-house referral  Clinical Social Worker      DC Planning Services  CM consult      Choice offered to / List presented to:             Status of service:  In process, will continue to follow Medicare Important Message given?   (If response is "NO", the following Medicare IM given date fields will be blank) Date Medicare IM given:   Date Additional Medicare IM given:    Discharge Disposition:    Per UR Regulation:  Reviewed for med. necessity/level of care/duration of stay  If discussed at Long Length of Stay Meetings, dates discussed:    Comments:  Contact:  Marlou Starks, dtr  [phone]  10/24/13 Ivyana Locey,RN,BSN [phone] KELLY FROM [facility] CALLED BACK; STATES LIFEVEST APPROVED. PLANS TO COME FIT LIFEVEST EARLY IN AM, SO THAT PT MAY DC TO CIR AFTERWARD IF MEDICALLY STABLE PER MED TEAMS.  10/24/13 [place] Dalporto,RN,BSN [phone] SPOKE WITH Dennis Bast FROM [facility]; AWAITING INSURANCE APPROVAL FOR LIFEVEST.  SHE WILL LIKELY RECEIVE TODAY AND STATES WILL FIT FOR VEST EITHER THIS EVE OR IN AM.  CIR PLANS TO ADMIT PT, AND CAN ADMIT ON FRIDAY, IF OK WITH SURGICAL AND CARDIOLOGY TEAMS, AFTER LIFEVEST RECEIVED.  10/23/13 Shaylon Gillean,RN,BSN [phone] CIR CONSULT IN PROGRESS.  CSW FOLLOWING FOR POSS SNF BACKUP, BUT PT STATES HE PLANS TO GO HOME WITH SUPPORT OF HIS 5 CHILDREN, IF DENIED.  PT BEING EVALUATED FOR LIFEVEST, PRESENTLY.  AWAIT REHAB MD TO EVALUATE.  10/22/13 1036 Henrietta Mayo RN MSN BSN CCM CIR consult.  Pt to xfer to SDU.  10/16/13 1208 Henrietta Mayo RN MSN  BSN CCM PT recommends CIR, family will provide 24/7 assistance.  Pt currently on amio/dopamine/milrinone/lasix gtts.  Will provide information re inpt rehab.  10/07/13 1116 Henrietta Mayo RN MSN BSN CCM Pt has 3 dtrs and 2 sons who live in the area and will be available to provide post-discharge assistance.   CM will follow for discharge needs.

## 2013-10-07 NOTE — Progress Notes (Addendum)
Patient and family refused to watch cardiac surgery video .Booklets given and extensive bedside teaching provided about lines and tubes with cardiac surgery .  2130: Reinforced OHS education with Pt's daughter r/t peri-operative routine ie; length of procedure, post-op care; attempted to include Pt in discussion and daughter stated "He doesn't want to hear about it", questions answered and support given.

## 2013-10-07 NOTE — Progress Notes (Signed)
Post void bladder scan complete 0 mls scanned

## 2013-10-08 ENCOUNTER — Encounter (HOSPITAL_COMMUNITY)
Admission: EM | Disposition: A | Discharge status: Rehabilitation Facility | Payer: PRIVATE HEALTH INSURANCE | Source: Home / Self Care | Attending: Cardiothoracic Surgery

## 2013-10-08 ENCOUNTER — Inpatient Hospital Stay (HOSPITAL_COMMUNITY): Payer: Medicare Other

## 2013-10-08 ENCOUNTER — Inpatient Hospital Stay (HOSPITAL_COMMUNITY): Payer: Medicare Other | Admitting: Anesthesiology

## 2013-10-08 ENCOUNTER — Encounter (HOSPITAL_COMMUNITY): Payer: Medicare Other | Admitting: Anesthesiology

## 2013-10-08 HISTORY — PX: CORONARY ARTERY BYPASS GRAFT: SHX141

## 2013-10-08 HISTORY — PX: INTRAOPERATIVE TRANSESOPHAGEAL ECHOCARDIOGRAM: SHX5062

## 2013-10-08 LAB — CBC
HCT: 32.3 % — ABNORMAL LOW (ref 39.0–52.0)
HCT: 27.8 % — ABNORMAL LOW (ref 39.0–52.0)
HCT: 25.4 % — ABNORMAL LOW (ref 39.0–52.0)
Hemoglobin: 11.6 g/dL — ABNORMAL LOW (ref 13.0–17.0)
Hemoglobin: 10 g/dL — ABNORMAL LOW (ref 13.0–17.0)
Hemoglobin: 9.1 g/dL — ABNORMAL LOW (ref 13.0–17.0)
MCH: 32.3 pg (ref 26.0–34.0)
MCH: 32.1 pg (ref 26.0–34.0)
MCH: 31.6 pg (ref 26.0–34.0)
MCHC: 35.9 g/dL (ref 30.0–36.0)
MCHC: 36 g/dL (ref 30.0–36.0)
MCHC: 35.8 g/dL (ref 30.0–36.0)
MCV: 90 fL (ref 78.0–100.0)
MCV: 89.1 fL (ref 78.0–100.0)
MCV: 88.2 fL (ref 78.0–100.0)
Platelets: 156 10*3/uL (ref 150–400)
Platelets: 249 K/uL (ref 150–400)
Platelets: 267 10*3/uL (ref 150–400)
RBC: 3.59 MIL/uL — ABNORMAL LOW (ref 4.22–5.81)
RBC: 3.12 MIL/uL — ABNORMAL LOW (ref 4.22–5.81)
RBC: 2.88 MIL/uL — ABNORMAL LOW (ref 4.22–5.81)
RDW: 13.8 % (ref 11.5–15.5)
RDW: 13.6 % (ref 11.5–15.5)
RDW: 13.3 % (ref 11.5–15.5)
WBC: 7.6 10*3/uL (ref 4.0–10.5)
WBC: 19.1 K/uL — ABNORMAL HIGH (ref 4.0–10.5)
WBC: 14.4 10*3/uL — ABNORMAL HIGH (ref 4.0–10.5)

## 2013-10-08 LAB — BASIC METABOLIC PANEL
BUN: 30 mg/dL — ABNORMAL HIGH (ref 6–23)
CO2: 29 mEq/L (ref 19–32)
Calcium: 8.6 mg/dL (ref 8.4–10.5)
Chloride: 97 mEq/L (ref 96–112)
Creatinine, Ser: 1.21 mg/dL (ref 0.50–1.35)
GFR calc Af Amer: 65 mL/min — ABNORMAL LOW (ref >90)
GFR calc non Af Amer: 56 mL/min — ABNORMAL LOW (ref >90)
Glucose, Bld: 89 mg/dL (ref 70–99)
Potassium: 3.3 mEq/L — ABNORMAL LOW (ref 3.5–5.1)
Sodium: 135 mEq/L (ref 135–145)

## 2013-10-08 LAB — POCT I-STAT 3, ART BLOOD GAS (G3+)
Acid-Base Excess: 2 mmol/L (ref 0.0–2.0)
Acid-Base Excess: 2 mmol/L (ref 0.0–2.0)
Acid-base deficit: 2 mmol/L (ref 0.0–2.0)
Bicarbonate: 27.4 mEq/L — ABNORMAL HIGH (ref 20.0–24.0)
Bicarbonate: 24.8 mEq/L — ABNORMAL HIGH (ref 20.0–24.0)
TCO2: 29 mmol/L (ref 0–100)
TCO2: 27 mmol/L (ref 0–100)
TCO2: 26 mmol/L (ref 0–100)
pCO2 arterial: 47.5 mmHg — ABNORMAL HIGH (ref 35.0–45.0)
pCO2 arterial: 46.5 mmHg — ABNORMAL HIGH (ref 35.0–45.0)
pH, Arterial: 7.379 (ref 7.350–7.450)
pH, Arterial: 7.348 — ABNORMAL LOW (ref 7.350–7.450)
pH, Arterial: 7.312 — ABNORMAL LOW (ref 7.350–7.450)
pO2, Arterial: 239 mmHg — ABNORMAL HIGH (ref 80.0–100.0)
pO2, Arterial: 277 mmHg — ABNORMAL HIGH (ref 80.0–100.0)
pO2, Arterial: 128 mmHg — ABNORMAL HIGH (ref 80.0–100.0)

## 2013-10-08 LAB — POCT I-STAT 4, (NA,K, GLUC, HGB,HCT)
Glucose, Bld: 96 mg/dL (ref 70–99)
Glucose, Bld: 100 mg/dL — ABNORMAL HIGH (ref 70–99)
Glucose, Bld: 171 mg/dL — ABNORMAL HIGH (ref 70–99)
Glucose, Bld: 182 mg/dL — ABNORMAL HIGH (ref 70–99)
HCT: 30 % — ABNORMAL LOW (ref 39.0–52.0)
HCT: 20 % — ABNORMAL LOW (ref 39.0–52.0)
HCT: 24 % — ABNORMAL LOW (ref 39.0–52.0)
Hemoglobin: 10.2 g/dL — ABNORMAL LOW (ref 13.0–17.0)
Hemoglobin: 7.8 g/dL — ABNORMAL LOW (ref 13.0–17.0)
Hemoglobin: 8.2 g/dL — ABNORMAL LOW (ref 13.0–17.0)
Hemoglobin: 10.5 g/dL — ABNORMAL LOW (ref 13.0–17.0)
Potassium: 3.4 mEq/L — ABNORMAL LOW (ref 3.5–5.1)
Potassium: 3.6 mEq/L (ref 3.5–5.1)
Potassium: 3.4 mEq/L — ABNORMAL LOW (ref 3.5–5.1)
Potassium: 4.3 mEq/L (ref 3.5–5.1)
Potassium: 3.5 mEq/L (ref 3.5–5.1)
Sodium: 135 mEq/L (ref 135–145)
Sodium: 135 mEq/L (ref 135–145)
Sodium: 129 mEq/L — ABNORMAL LOW (ref 135–145)

## 2013-10-08 LAB — MAGNESIUM: Magnesium: 3 mg/dL — ABNORMAL HIGH (ref 1.5–2.5)

## 2013-10-08 LAB — PREPARE RBC (CROSSMATCH)

## 2013-10-08 LAB — PLATELET COUNT: Platelets: 89 10*3/uL — ABNORMAL LOW (ref 150–400)

## 2013-10-08 LAB — CARBOXYHEMOGLOBIN
Carboxyhemoglobin: 1.7 % — ABNORMAL HIGH (ref 0.5–1.5)
Methemoglobin: 1.3 % (ref 0.0–1.5)
O2 Saturation: 68.2 %
Total hemoglobin: 9.6 g/dL — ABNORMAL LOW (ref 13.5–18.0)

## 2013-10-08 LAB — HEMOGLOBIN AND HEMATOCRIT, BLOOD
HCT: 20.2 % — ABNORMAL LOW (ref 39.0–52.0)
Hemoglobin: 7.3 g/dL — ABNORMAL LOW (ref 13.0–17.0)

## 2013-10-08 LAB — CK TOTAL AND CKMB (NOT AT ARMC)
CK, MB: 9.6 ng/mL (ref 0.3–4.0)
Relative Index: 1.3 (ref 0.0–2.5)
Total CK: 749 U/L — ABNORMAL HIGH (ref 7–232)

## 2013-10-08 LAB — APTT: aPTT: 34 seconds (ref 24–37)

## 2013-10-08 LAB — PROTIME-INR
INR: 1.39 (ref 0.00–1.49)
Prothrombin Time: 16.7 s — ABNORMAL HIGH (ref 11.6–15.2)

## 2013-10-08 LAB — GLUCOSE, CAPILLARY: Glucose-Capillary: 167 mg/dL — ABNORMAL HIGH (ref 70–99)

## 2013-10-08 LAB — CREATININE, SERUM
Creatinine, Ser: 0.96 mg/dL (ref 0.50–1.35)
GFR calc Af Amer: >90 mL/min (ref >90)
GFR calc non Af Amer: 78 mL/min — ABNORMAL LOW (ref >90)

## 2013-10-08 SURGERY — CORONARY ARTERY BYPASS GRAFTING (CABG)
Anesthesia: General | Site: Chest | Wound class: Clean

## 2013-10-08 MED ORDER — SODIUM CHLORIDE 0.45 % IV SOLN
INTRAVENOUS | Status: DC
  Administered 2013-10-08 – 2013-10-14 (×4): via INTRAVENOUS

## 2013-10-08 MED ORDER — DOPAMINE-DEXTROSE 3.2-5 MG/ML-% IV SOLN
0.0000 ug/kg/min | INTRAVENOUS | Status: DC
  Administered 2013-10-09 – 2013-10-10 (×2): 5 ug/kg/min via INTRAVENOUS
  Administered 2013-10-11: 3 ug/kg/min via INTRAVENOUS
  Administered 2013-10-11: 5 ug/kg/min via INTRAVENOUS
  Administered 2013-10-12 – 2013-10-14 (×2): 3 ug/kg/min via INTRAVENOUS
  Filled 2013-10-08 (×6): qty 250

## 2013-10-08 MED ORDER — VANCOMYCIN HCL IN DEXTROSE 1-5 GM/200ML-% IV SOLN
1000.0000 mg | Freq: Two times a day (BID) | INTRAVENOUS | Status: AC
  Administered 2013-10-08 – 2013-10-09 (×3): 1000 mg via INTRAVENOUS
  Filled 2013-10-08 (×3): qty 200

## 2013-10-08 MED ORDER — DOCUSATE SODIUM 100 MG PO CAPS
200.0000 mg | ORAL_CAPSULE | Freq: Every day | ORAL | Status: DC
  Administered 2013-10-10 – 2013-10-22 (×9): 200 mg via ORAL
  Filled 2013-10-08 (×14): qty 2

## 2013-10-08 MED ORDER — DEXTROSE 5 % IV SOLN
1.5000 g | Freq: Two times a day (BID) | INTRAVENOUS | Status: DC
  Administered 2013-10-08 – 2013-10-09 (×3): 1.5 g via INTRAVENOUS
  Filled 2013-10-08 (×5): qty 1.5

## 2013-10-08 MED ORDER — ASPIRIN EC 325 MG PO TBEC
325.0000 mg | DELAYED_RELEASE_TABLET | Freq: Every day | ORAL | Status: DC
  Administered 2013-10-09 – 2013-10-18 (×5): 325 mg via ORAL
  Filled 2013-10-08 (×11): qty 1

## 2013-10-08 MED ORDER — POTASSIUM CHLORIDE 10 MEQ/50ML IV SOLN
10.0000 meq | INTRAVENOUS | Status: DC
  Filled 2013-10-08: qty 50

## 2013-10-08 MED ORDER — SODIUM CHLORIDE 0.9 % IV SOLN
10.0000 mg | INTRAVENOUS | Status: DC | PRN
  Administered 2013-10-08: 5 ug/min via INTRAVENOUS

## 2013-10-08 MED ORDER — 0.9 % SODIUM CHLORIDE (POUR BTL) OPTIME
TOPICAL | Status: DC | PRN
  Administered 2013-10-08: 1000 mL

## 2013-10-08 MED ORDER — MILRINONE IN DEXTROSE 20 MG/100ML IV SOLN
0.1250 ug/kg/min | INTRAVENOUS | Status: DC
  Filled 2013-10-08: qty 100

## 2013-10-08 MED ORDER — MILRINONE IN DEXTROSE 20 MG/100ML IV SOLN
INTRAVENOUS | Status: DC | PRN
  Administered 2013-10-08: .2 ug/kg/min via INTRAVENOUS

## 2013-10-08 MED ORDER — ATORVASTATIN CALCIUM 80 MG PO TABS
80.0000 mg | ORAL_TABLET | Freq: Every day | ORAL | Status: DC
  Administered 2013-10-09 – 2013-10-24 (×14): 80 mg via ORAL
  Filled 2013-10-08 (×18): qty 1

## 2013-10-08 MED ORDER — SODIUM CHLORIDE 0.9 % IJ SOLN
OROMUCOSAL | Status: DC | PRN
  Administered 2013-10-08: 10:00:00 via TOPICAL

## 2013-10-08 MED ORDER — METOPROLOL TARTRATE 25 MG/10 ML ORAL SUSPENSION
12.5000 mg | Freq: Two times a day (BID) | ORAL | Status: DC
  Administered 2013-10-12 – 2013-10-13 (×2): 12.5 mg
  Filled 2013-10-08 (×17): qty 5

## 2013-10-08 MED ORDER — MILRINONE IN DEXTROSE 20 MG/100ML IV SOLN
0.1000 ug/kg/min | INTRAVENOUS | Status: DC
  Administered 2013-10-09 – 2013-10-10 (×3): 0.2 ug/kg/min via INTRAVENOUS
  Administered 2013-10-11: 0.1 ug/kg/min via INTRAVENOUS
  Filled 2013-10-08 (×6): qty 100

## 2013-10-08 MED ORDER — LACTATED RINGERS IV SOLN
INTRAVENOUS | Status: DC
  Administered 2013-10-11 – 2013-10-12 (×2): via INTRAVENOUS

## 2013-10-08 MED ORDER — MIDAZOLAM HCL 5 MG/5ML IJ SOLN
INTRAMUSCULAR | Status: DC | PRN
  Administered 2013-10-08: 1 mg via INTRAVENOUS
  Administered 2013-10-08: 4 mg via INTRAVENOUS

## 2013-10-08 MED ORDER — ACETAMINOPHEN 500 MG PO TABS
1000.0000 mg | ORAL_TABLET | Freq: Four times a day (QID) | ORAL | Status: AC
  Administered 2013-10-09 – 2013-10-12 (×8): 1000 mg via ORAL
  Filled 2013-10-08 (×18): qty 2

## 2013-10-08 MED ORDER — HEMOSTATIC AGENTS (NO CHARGE) OPTIME
TOPICAL | Status: DC | PRN
  Administered 2013-10-08: 1 via TOPICAL

## 2013-10-08 MED ORDER — SODIUM CHLORIDE 0.9 % IV SOLN
5.0000 g | Freq: Once | INTRAVENOUS | Status: DC
  Filled 2013-10-08: qty 20

## 2013-10-08 MED ORDER — METOPROLOL TARTRATE 1 MG/ML IV SOLN: 2.5000 mg | INTRAVENOUS | Status: DC | PRN

## 2013-10-08 MED ORDER — INSULIN REGULAR BOLUS VIA INFUSION
0.0000 [IU] | Freq: Three times a day (TID) | INTRAVENOUS | Status: DC
  Filled 2013-10-08: qty 10

## 2013-10-08 MED ORDER — FENTANYL CITRATE 0.05 MG/ML IJ SOLN
INTRAMUSCULAR | Status: DC | PRN
  Administered 2013-10-08: 200 ug via INTRAVENOUS
  Administered 2013-10-08: 250 ug via INTRAVENOUS
  Administered 2013-10-08: 500 ug via INTRAVENOUS
  Administered 2013-10-08: 50 ug via INTRAVENOUS
  Administered 2013-10-08 (×2): 250 ug via INTRAVENOUS

## 2013-10-08 MED ORDER — SODIUM CHLORIDE 0.9 % IJ SOLN
3.0000 mL | Freq: Two times a day (BID) | INTRAMUSCULAR | Status: DC
  Administered 2013-10-09 – 2013-10-19 (×8): 3 mL via INTRAVENOUS

## 2013-10-08 MED ORDER — VECURONIUM BROMIDE 10 MG IV SOLR
INTRAVENOUS | Status: DC | PRN
  Administered 2013-10-08: 5 mg via INTRAVENOUS
  Administered 2013-10-08: 20 mg via INTRAVENOUS
  Administered 2013-10-08: 5 mg via INTRAVENOUS

## 2013-10-08 MED ORDER — PROTAMINE SULFATE 10 MG/ML IV SOLN
INTRAVENOUS | Status: DC | PRN
  Administered 2013-10-08: 350 mg via INTRAVENOUS

## 2013-10-08 MED ORDER — NOREPINEPHRINE BITARTRATE 1 MG/ML IJ SOLN
2.0000 ug/min | INTRAVENOUS | Status: AC
  Administered 2013-10-08: 9 ug/min via INTRAVENOUS
  Filled 2013-10-08 (×2): qty 4

## 2013-10-08 MED ORDER — ACETAMINOPHEN 160 MG/5ML PO SOLN: 650.0000 mg | Freq: Once | ORAL | Status: AC

## 2013-10-08 MED ORDER — DEXMEDETOMIDINE HCL IN NACL 200 MCG/50ML IV SOLN
0.4000 ug/kg/h | INTRAVENOUS | Status: DC
  Filled 2013-10-08: qty 50

## 2013-10-08 MED ORDER — ALBUMIN HUMAN 5 % IV SOLN
250.0000 mL | INTRAVENOUS | Status: AC | PRN
  Administered 2013-10-09 (×2): 250 mL via INTRAVENOUS

## 2013-10-08 MED ORDER — SODIUM CHLORIDE 0.9 % IV SOLN
20.0000 ug | Freq: Once | INTRAVENOUS | Status: AC
  Administered 2013-10-08: 20 ug via INTRAVENOUS
  Filled 2013-10-08: qty 5

## 2013-10-08 MED ORDER — LACTATED RINGERS IV SOLN
INTRAVENOUS | Status: DC | PRN
  Administered 2013-10-08 (×2): via INTRAVENOUS

## 2013-10-08 MED ORDER — BISACODYL 5 MG PO TBEC
10.0000 mg | DELAYED_RELEASE_TABLET | Freq: Every day | ORAL | Status: DC
  Administered 2013-10-17 – 2013-10-22 (×4): 10 mg via ORAL
  Filled 2013-10-08 (×4): qty 2

## 2013-10-08 MED ORDER — SODIUM CHLORIDE 0.9 % IV SOLN
INTRAVENOUS | Status: DC
  Administered 2013-10-09: 10 mL/h via INTRAVENOUS
  Administered 2013-10-12: 13:00:00 via INTRAVENOUS

## 2013-10-08 MED ORDER — ASPIRIN 81 MG PO CHEW
324.0000 mg | CHEWABLE_TABLET | Freq: Every day | ORAL | Status: DC
  Administered 2013-10-10 – 2013-10-14 (×3): 324 mg
  Filled 2013-10-08 (×3): qty 4

## 2013-10-08 MED ORDER — OXYCODONE HCL 5 MG PO TABS
5.0000 mg | ORAL_TABLET | ORAL | Status: DC | PRN
  Administered 2013-10-11 (×2): 10 mg via ORAL
  Administered 2013-10-14: 5 mg via ORAL
  Administered 2013-10-14 – 2013-10-17 (×4): 10 mg via ORAL
  Administered 2013-10-18: 5 mg via ORAL
  Administered 2013-10-18 – 2013-10-19 (×3): 10 mg via ORAL
  Administered 2013-10-19 – 2013-10-24 (×5): 5 mg via ORAL
  Filled 2013-10-08: qty 2
  Filled 2013-10-08: qty 1
  Filled 2013-10-08: qty 2
  Filled 2013-10-08: qty 1
  Filled 2013-10-08: qty 2
  Filled 2013-10-08: qty 1
  Filled 2013-10-08: qty 2
  Filled 2013-10-08: qty 1
  Filled 2013-10-08 (×3): qty 2
  Filled 2013-10-08 (×2): qty 1
  Filled 2013-10-08 (×2): qty 2
  Filled 2013-10-08: qty 1

## 2013-10-08 MED ORDER — METOPROLOL TARTRATE 12.5 MG HALF TABLET
12.5000 mg | ORAL_TABLET | Freq: Two times a day (BID) | ORAL | Status: DC
  Administered 2013-10-14: 12.5 mg via ORAL
  Filled 2013-10-08 (×17): qty 1

## 2013-10-08 MED ORDER — ACETAMINOPHEN 160 MG/5ML PO SOLN
1000.0000 mg | Freq: Four times a day (QID) | ORAL | Status: AC
  Administered 2013-10-09 – 2013-10-13 (×10): 1000 mg
  Filled 2013-10-08 (×9): qty 40.6

## 2013-10-08 MED ORDER — SUCCINYLCHOLINE CHLORIDE 20 MG/ML IJ SOLN
INTRAMUSCULAR | Status: DC | PRN
  Administered 2013-10-08: 120 mg via INTRAVENOUS

## 2013-10-08 MED ORDER — MORPHINE SULFATE 2 MG/ML IJ SOLN
2.0000 mg | INTRAMUSCULAR | Status: DC | PRN
  Administered 2013-10-09 (×3): 2 mg via INTRAVENOUS
  Administered 2013-10-09: 4 mg via INTRAVENOUS
  Administered 2013-10-09 (×2): 2 mg via INTRAVENOUS
  Administered 2013-10-09: 4 mg via INTRAVENOUS
  Administered 2013-10-09: 2 mg via INTRAVENOUS
  Administered 2013-10-09 – 2013-10-10 (×9): 4 mg via INTRAVENOUS
  Administered 2013-10-11: 2 mg via INTRAVENOUS
  Administered 2013-10-11: 4 mg via INTRAVENOUS
  Administered 2013-10-11: 2 mg via INTRAVENOUS
  Administered 2013-10-12 – 2013-10-13 (×3): 4 mg via INTRAVENOUS
  Filled 2013-10-08 (×4): qty 2
  Filled 2013-10-08 (×2): qty 1
  Filled 2013-10-08 (×7): qty 2
  Filled 2013-10-08: qty 1
  Filled 2013-10-08: qty 2
  Filled 2013-10-08 (×3): qty 1
  Filled 2013-10-08 (×3): qty 2
  Filled 2013-10-08 (×2): qty 1

## 2013-10-08 MED ORDER — LEVALBUTEROL HCL 1.25 MG/0.5ML IN NEBU
1.2500 mg | INHALATION_SOLUTION | Freq: Four times a day (QID) | RESPIRATORY_TRACT | Status: DC
  Administered 2013-10-08 – 2013-10-17 (×37): 1.25 mg via RESPIRATORY_TRACT
  Filled 2013-10-08 (×39): qty 0.5

## 2013-10-08 MED ORDER — BISACODYL 10 MG RE SUPP
10.0000 mg | Freq: Every day | RECTAL | Status: DC
  Administered 2013-10-10 – 2013-10-11 (×2): 10 mg via RECTAL
  Filled 2013-10-08 (×2): qty 1

## 2013-10-08 MED ORDER — PANTOPRAZOLE SODIUM 40 MG PO TBEC: 40.0000 mg | DELAYED_RELEASE_TABLET | Freq: Every day | ORAL | Status: DC

## 2013-10-08 MED ORDER — SODIUM CHLORIDE 0.9 % IJ SOLN
OROMUCOSAL | Status: DC | PRN
  Administered 2013-10-08: 14:00:00 via TOPICAL

## 2013-10-08 MED ORDER — FAMOTIDINE IN NACL 20-0.9 MG/50ML-% IV SOLN
20.0000 mg | Freq: Two times a day (BID) | INTRAVENOUS | Status: AC
  Administered 2013-10-08 (×2): 20 mg via INTRAVENOUS
  Filled 2013-10-08: qty 50

## 2013-10-08 MED ORDER — ALBUMIN HUMAN 5 % IV SOLN
INTRAVENOUS | Status: DC | PRN
  Administered 2013-10-08 (×3): via INTRAVENOUS

## 2013-10-08 MED ORDER — ONDANSETRON HCL 4 MG/2ML IJ SOLN: 4.0000 mg | Freq: Four times a day (QID) | INTRAMUSCULAR | Status: DC | PRN

## 2013-10-08 MED ORDER — PHENYLEPHRINE HCL 10 MG/ML IJ SOLN
0.0000 ug/min | INTRAVENOUS | Status: DC
  Filled 2013-10-08: qty 2

## 2013-10-08 MED ORDER — NOREPINEPHRINE BITARTRATE 1 MG/ML IJ SOLN
4000.0000 ug | INTRAVENOUS | Status: DC | PRN
  Administered 2013-10-08: 3 ug/min via INTRAVENOUS

## 2013-10-08 MED ORDER — MIDAZOLAM HCL 2 MG/2ML IJ SOLN
2.0000 mg | INTRAMUSCULAR | Status: DC | PRN
  Administered 2013-10-08 – 2013-10-13 (×23): 2 mg via INTRAVENOUS
  Filled 2013-10-08 (×27): qty 2

## 2013-10-08 MED ORDER — VANCOMYCIN HCL IN DEXTROSE 1-5 GM/200ML-% IV SOLN
1000.0000 mg | Freq: Once | INTRAVENOUS | Status: DC
  Filled 2013-10-08: qty 200

## 2013-10-08 MED ORDER — SODIUM CHLORIDE 0.9 % IJ SOLN: 3.0000 mL | INTRAMUSCULAR | Status: DC | PRN

## 2013-10-08 MED ORDER — LACTATED RINGERS IV SOLN: 500.0000 mL | Freq: Once | INTRAVENOUS | Status: DC | PRN

## 2013-10-08 MED ORDER — NITROGLYCERIN IN D5W 200-5 MCG/ML-% IV SOLN: 0.0000 ug/min | INTRAVENOUS | Status: DC

## 2013-10-08 MED ORDER — HEPARIN SODIUM (PORCINE) 1000 UNIT/ML IJ SOLN
INTRAMUSCULAR | Status: DC | PRN
  Administered 2013-10-08: 2000 [IU] via INTRAVENOUS
  Administered 2013-10-08: 33000 [IU] via INTRAVENOUS
  Administered 2013-10-08: 5000 [IU] via INTRAVENOUS

## 2013-10-08 MED ORDER — PROPOFOL 10 MG/ML IV BOLUS
INTRAVENOUS | Status: DC | PRN
  Administered 2013-10-08: 50 mg via INTRAVENOUS

## 2013-10-08 MED ORDER — LACTATED RINGERS IV SOLN
INTRAVENOUS | Status: DC | PRN
  Administered 2013-10-08 (×3): via INTRAVENOUS

## 2013-10-08 MED ORDER — LACTATED RINGERS IV SOLN
INTRAVENOUS | Status: DC | PRN
  Administered 2013-10-08: 08:00:00 via INTRAVENOUS

## 2013-10-08 MED ORDER — CALCIUM CHLORIDE 10 % IV SOLN
INTRAVENOUS | Status: DC | PRN
  Administered 2013-10-08: 500 mg via INTRAVENOUS
  Administered 2013-10-08: 300 mg via INTRAVENOUS
  Administered 2013-10-08: 200 mg via INTRAVENOUS

## 2013-10-08 MED ORDER — SODIUM CHLORIDE 0.9 % IV SOLN: 250.0000 mL | INTRAVENOUS | Status: DC

## 2013-10-08 MED ORDER — DEXMEDETOMIDINE HCL IN NACL 200 MCG/50ML IV SOLN
0.1000 ug/kg/h | INTRAVENOUS | Status: DC
  Administered 2013-10-08 – 2013-10-09 (×5): 0.7 ug/kg/h via INTRAVENOUS
  Filled 2013-10-08 (×7): qty 50

## 2013-10-08 MED ORDER — POTASSIUM CHLORIDE 10 MEQ/50ML IV SOLN
10.0000 meq | INTRAVENOUS | Status: AC | PRN
  Administered 2013-10-08 (×3): 10 meq via INTRAVENOUS
  Filled 2013-10-08: qty 50

## 2013-10-08 MED ORDER — MORPHINE SULFATE 2 MG/ML IJ SOLN
1.0000 mg | INTRAMUSCULAR | Status: AC | PRN
  Administered 2013-10-08: 2 mg via INTRAVENOUS
  Filled 2013-10-08: qty 2
  Filled 2013-10-08: qty 1

## 2013-10-08 MED ORDER — SODIUM CHLORIDE 0.9 % IV SOLN
INTRAVENOUS | Status: DC
  Administered 2013-10-08: 6.4 [IU]/h via INTRAVENOUS
  Administered 2013-10-09: 5.5 [IU]/h via INTRAVENOUS
  Administered 2013-10-10: 4.2 [IU]/h via INTRAVENOUS
  Filled 2013-10-08 (×5): qty 1

## 2013-10-08 MED ORDER — MAGNESIUM SULFATE 40 MG/ML IJ SOLN
4.0000 g | Freq: Once | INTRAMUSCULAR | Status: AC
  Administered 2013-10-08: 4 g via INTRAVENOUS
  Filled 2013-10-08: qty 100

## 2013-10-08 MED ORDER — ROCURONIUM BROMIDE 100 MG/10ML IV SOLN
INTRAVENOUS | Status: DC | PRN
  Administered 2013-10-08 (×2): 50 mg via INTRAVENOUS

## 2013-10-08 MED ORDER — POTASSIUM CHLORIDE 10 MEQ/50ML IV SOLN
10.0000 meq | INTRAVENOUS | Status: AC
  Administered 2013-10-08 (×3): 10 meq via INTRAVENOUS

## 2013-10-08 MED ORDER — LEVALBUTEROL HCL 1.25 MG/0.5ML IN NEBU
1.2500 mg | INHALATION_SOLUTION | Freq: Four times a day (QID) | RESPIRATORY_TRACT | Status: DC
  Filled 2013-10-08 (×3): qty 0.5

## 2013-10-08 MED ORDER — ACETAMINOPHEN 650 MG RE SUPP
650.0000 mg | Freq: Once | RECTAL | Status: AC
  Administered 2013-10-08: 650 mg via RECTAL

## 2013-10-08 SURGICAL SUPPLY — 113 items
ADAPTER CARDIO PERF ANTE/RETRO (ADAPTER) ×4 IMPLANT
ADPR PRFSN 84XANTGRD RTRGD (ADAPTER) ×2
ATTRACTOMAT 16X20 MAGNETIC DRP (DRAPES) ×4 IMPLANT
BAG DECANTER FOR FLEXI CONT (MISCELLANEOUS) ×4 IMPLANT
BANDAGE ELASTIC 4 VELCRO ST LF (GAUZE/BANDAGES/DRESSINGS) ×4 IMPLANT
BANDAGE ELASTIC 6 VELCRO ST LF (GAUZE/BANDAGES/DRESSINGS) ×4 IMPLANT
BANDAGE GAUZE ELAST BULKY 4 IN (GAUZE/BANDAGES/DRESSINGS) ×4 IMPLANT
BASKET HEART  (ORDER IN 25'S) (MISCELLANEOUS) ×1
BASKET HEART (ORDER IN 25'S) (MISCELLANEOUS) ×1
BASKET HEART (ORDER IN 25S) (MISCELLANEOUS) ×2 IMPLANT
BLADE STERNUM SYSTEM 6 (BLADE) ×4 IMPLANT
BLADE SURG 12 STRL SS (BLADE) ×4 IMPLANT
BLADE SURG ROTATE 9660 (MISCELLANEOUS) IMPLANT
CANISTER SUCTION 2500CC (MISCELLANEOUS) ×4 IMPLANT
CANNULA AORTIC HI-FLOW 6.5M20F (CANNULA) ×4 IMPLANT
CANNULA ARTERIAL NVNT 3/8 20FR (MISCELLANEOUS) ×4 IMPLANT
CANNULA ARTERIAL NVNT 3/8 22FR (MISCELLANEOUS) ×2 IMPLANT
CANNULA GUNDRY RCSP 15FR (MISCELLANEOUS) ×4 IMPLANT
CANNULA VENOUS LOW PROF 32X40 (CANNULA) ×4 IMPLANT
CANNULA VENOUS MAL SGL STG 40 (MISCELLANEOUS) IMPLANT
CANNULA VESSEL W/WING W/VALVE (CANNULA) ×6 IMPLANT
CANNULAE VENOUS MAL SGL STG 40 (MISCELLANEOUS)
CATH CPB KIT VANTRIGT (MISCELLANEOUS) ×4 IMPLANT
CATH ROBINSON RED A/P 18FR (CATHETERS) ×14 IMPLANT
CATH THORACIC 28FR (CATHETERS) IMPLANT
CATH THORACIC 28FR RT ANG (CATHETERS) IMPLANT
CATH THORACIC 36FR (CATHETERS) IMPLANT
CATH THORACIC 36FR RT ANG (CATHETERS) ×8 IMPLANT
CLIP RETRACTION 3.0MM CORONARY (MISCELLANEOUS) ×2 IMPLANT
CLIP TI WIDE RED SMALL 24 (CLIP) IMPLANT
CONT SPEC 4OZ CLIKSEAL STRL BL (MISCELLANEOUS) ×2 IMPLANT
COVER SURGICAL LIGHT HANDLE (MISCELLANEOUS) ×4 IMPLANT
CRADLE DONUT ADULT HEAD (MISCELLANEOUS) ×4 IMPLANT
DRAIN CHANNEL 32F RND 10.7 FF (WOUND CARE) ×4 IMPLANT
DRAPE CARDIOVASCULAR INCISE (DRAPES) ×4
DRAPE INCISE 23X17 IOBAN STRL (DRAPES) ×2
DRAPE INCISE 23X17 STRL (DRAPES) IMPLANT
DRAPE INCISE IOBAN 23X17 STRL (DRAPES) ×2 IMPLANT
DRAPE SLUSH/WARMER DISC (DRAPES) ×4 IMPLANT
DRAPE SRG 135X102X78XABS (DRAPES) ×2 IMPLANT
DRSG AQUACEL AG ADV 3.5X14 (GAUZE/BANDAGES/DRESSINGS) ×4 IMPLANT
DRSG COVADERM 4X14 (GAUZE/BANDAGES/DRESSINGS) ×4 IMPLANT
ELECT BLADE 4.0 EZ CLEAN MEGAD (MISCELLANEOUS) ×4
ELECT BLADE 6.5 EXT (BLADE) ×4 IMPLANT
ELECT CAUTERY BLADE 6.4 (BLADE) ×4 IMPLANT
ELECT REM PT RETURN 9FT ADLT (ELECTROSURGICAL) ×8
ELECTRODE BLDE 4.0 EZ CLN MEGD (MISCELLANEOUS) ×2 IMPLANT
ELECTRODE REM PT RTRN 9FT ADLT (ELECTROSURGICAL) ×4 IMPLANT
GLOVE BIO SURGEON STRL SZ7.5 (GLOVE) ×8 IMPLANT
GOWN STRL NON-REIN LRG LVL3 (GOWN DISPOSABLE) ×24 IMPLANT
HEMOSTAT POWDER SURGIFOAM 1G (HEMOSTASIS) ×14 IMPLANT
HEMOSTAT SURGICEL 2X14 (HEMOSTASIS) ×4 IMPLANT
INSERT FOGARTY XLG (MISCELLANEOUS) IMPLANT
KIT BASIN OR (CUSTOM PROCEDURE TRAY) ×4 IMPLANT
KIT ROOM TURNOVER OR (KITS) ×4 IMPLANT
KIT SUCTION CATH 14FR (SUCTIONS) ×4 IMPLANT
KIT VASOVIEW W/TROCAR VH 2000 (KITS) ×4 IMPLANT
LEAD PACING MYOCARDI (MISCELLANEOUS) ×4 IMPLANT
MARKER GRAFT CORONARY BYPASS (MISCELLANEOUS) ×12 IMPLANT
NS IRRIG 1000ML POUR BTL (IV SOLUTION) ×22 IMPLANT
PACK OPEN HEART (CUSTOM PROCEDURE TRAY) ×4 IMPLANT
PAD ARMBOARD 7.5X6 YLW CONV (MISCELLANEOUS) ×8 IMPLANT
PAD ELECT DEFIB RADIOL ZOLL (MISCELLANEOUS) ×4 IMPLANT
PENCIL BUTTON HOLSTER BLD 10FT (ELECTRODE) ×4 IMPLANT
PUNCH AORTIC ROTATE 4.0MM (MISCELLANEOUS) IMPLANT
PUNCH AORTIC ROTATE 4.5MM 8IN (MISCELLANEOUS) ×2 IMPLANT
PUNCH AORTIC ROTATE 5MM 8IN (MISCELLANEOUS) IMPLANT
SET CARDIOPLEGIA MPS 5001102 (MISCELLANEOUS) ×2 IMPLANT
SOLUTION ANTI FOG 6CC (MISCELLANEOUS) ×2 IMPLANT
SPONGE GAUZE 4X4 12PLY (GAUZE/BANDAGES/DRESSINGS) ×8 IMPLANT
SPONGE GAUZE 4X4 16PLY UNSTER (WOUND CARE) ×2 IMPLANT
SURGIFLO W/THROMBIN 8M KIT (HEMOSTASIS) ×2 IMPLANT
SUT BONE WAX W31G (SUTURE) ×4 IMPLANT
SUT MNCRL AB 4-0 PS2 18 (SUTURE) IMPLANT
SUT PROLENE 3 0 SH DA (SUTURE) ×2 IMPLANT
SUT PROLENE 3 0 SH1 36 (SUTURE) IMPLANT
SUT PROLENE 4 0 RB 1 (SUTURE) ×4
SUT PROLENE 4 0 SH DA (SUTURE) ×4 IMPLANT
SUT PROLENE 4-0 RB1 .5 CRCL 36 (SUTURE) ×2 IMPLANT
SUT PROLENE 5 0 C 1 36 (SUTURE) IMPLANT
SUT PROLENE 6 0 C 1 30 (SUTURE) ×2 IMPLANT
SUT PROLENE 6 0 CC (SUTURE) ×8 IMPLANT
SUT PROLENE 7 0 DA (SUTURE) IMPLANT
SUT PROLENE 7.0 RB 3 (SUTURE) ×12 IMPLANT
SUT PROLENE 8 0 BV175 6 (SUTURE) IMPLANT
SUT PROLENE BLUE 7 0 (SUTURE) ×4 IMPLANT
SUT SILK  1 MH (SUTURE)
SUT SILK 1 MH (SUTURE) IMPLANT
SUT SILK 2 0 SH CR/8 (SUTURE) ×2 IMPLANT
SUT SILK 3 0 SH CR/8 (SUTURE) IMPLANT
SUT STEEL 6MS V (SUTURE) ×8 IMPLANT
SUT STEEL STERNAL CCS#1 18IN (SUTURE) ×4 IMPLANT
SUT STEEL SZ 6 DBL 3X14 BALL (SUTURE) ×4 IMPLANT
SUT VIC AB 1 CTX 18 (SUTURE) ×6 IMPLANT
SUT VIC AB 1 CTX 36 (SUTURE) ×8
SUT VIC AB 1 CTX36XBRD ANBCTR (SUTURE) ×4 IMPLANT
SUT VIC AB 2-0 CT1 27 (SUTURE)
SUT VIC AB 2-0 CT1 36 (SUTURE) ×2 IMPLANT
SUT VIC AB 2-0 CT1 TAPERPNT 27 (SUTURE) IMPLANT
SUT VIC AB 2-0 CTX 27 (SUTURE) IMPLANT
SUT VIC AB 3-0 SH 27 (SUTURE)
SUT VIC AB 3-0 SH 27X BRD (SUTURE) IMPLANT
SUT VIC AB 3-0 X1 27 (SUTURE) ×2 IMPLANT
SUT VICRYL 4-0 PS2 18IN ABS (SUTURE) IMPLANT
SUTURE E-PAK OPEN HEART (SUTURE) ×4 IMPLANT
SYSTEM SAHARA CHEST DRAIN ATS (WOUND CARE) ×4 IMPLANT
TAPE CLOTH SURG 4X10 WHT LF (GAUZE/BANDAGES/DRESSINGS) ×4 IMPLANT
TOWEL OR 17X24 6PK STRL BLUE (TOWEL DISPOSABLE) ×8 IMPLANT
TOWEL OR 17X26 10 PK STRL BLUE (TOWEL DISPOSABLE) ×8 IMPLANT
TRAY FOLEY IC TEMP SENS 14FR (CATHETERS) ×4 IMPLANT
TUBING INSUFFLATION 10FT LAP (TUBING) ×4 IMPLANT
UNDERPAD 30X30 INCONTINENT (UNDERPADS AND DIAPERS) ×4 IMPLANT
WATER STERILE IRR 1000ML POUR (IV SOLUTION) ×8 IMPLANT

## 2013-10-08 NOTE — Progress Notes (Signed)
  Echocardiogram Echocardiogram Transesophageal has been performed.  Antonio Bernard 10/08/2013, 12:01 PM

## 2013-10-08 NOTE — Anesthesia Procedure Notes (Signed)
Procedure Name: Intubation Date/Time: 10/08/2013 8:11 AM Performed by: Armandina Gemma Pre-anesthesia Checklist: Patient identified, Emergency Drugs available, Timeout performed, Suction available and Patient being monitored Patient Re-evaluated:Patient Re-evaluated prior to inductionOxygen Delivery Method: Circle system utilized Preoxygenation: Pre-oxygenation with 100% oxygen Intubation Type: IV induction Ventilation: Mask ventilation with difficulty, Two handed mask ventilation required and Oral airway inserted - appropriate to patient size Laryngoscope Size: Hyacinth Meeker and 2 Grade View: Grade I Tube type: Oral Tube size: 8.0 mm Number of attempts: 1 Airway Equipment and Method: Stylet Placement Confirmation: ETT inserted through vocal cords under direct vision,  breath sounds checked- equal and bilateral and positive ETCO2 Secured at: 22 cm Tube secured with: Tape Dental Injury: Teeth and Oropharynx as per pre-operative assessment  Comments: IV induction Fitzgerald- poor mask ventilation WITH ORAL AW IN PLACE- IV SUX WITH IMPROVEMENT OF VENTILATION- intubation AM CRNA atraumatic- no teeth as preop- mouth as preop

## 2013-10-08 NOTE — Anesthesia Preprocedure Evaluation (Signed)
Anesthesia Evaluation  Patient identified by MRN, date of birth, ID band Patient awake    Reviewed: Allergy & Precautions, H&P , NPO status , Patient's Chart, lab work & pertinent test results, reviewed documented beta blocker date and time   Airway Mallampati: III TM Distance: >3 FB Neck ROM: Full    Dental no notable dental hx. (+) Edentulous Upper, Edentulous Lower and Dental Advisory Given   Pulmonary sleep apnea ,  breath sounds clear to auscultation  Pulmonary exam normal       Cardiovascular + angina + CAD, + Past MI and + Peripheral Vascular Disease Rhythm:Regular Rate:Normal     Neuro/Psych negative neurological ROS  negative psych ROS   GI/Hepatic Neg liver ROS, GERD-  Medicated and Controlled,  Endo/Other  Morbid obesity  Renal/GU negative Renal ROS  negative genitourinary   Musculoskeletal   Abdominal   Peds  Hematology negative hematology ROS (+)   Anesthesia Other Findings   Reproductive/Obstetrics negative OB ROS                           Anesthesia Physical Anesthesia Plan  ASA: IV  Anesthesia Plan: General   Post-op Pain Management:    Induction: Intravenous  Airway Management Planned: Oral ETT  Additional Equipment: Arterial line, CVP, PA Cath, TEE and Ultrasound Guidance Line Placement  Intra-op Plan:   Post-operative Plan: Post-operative intubation/ventilation  Informed Consent: I have reviewed the patients History and Physical, chart, labs and discussed the procedure including the risks, benefits and alternatives for the proposed anesthesia with the patient or authorized representative who has indicated his/her understanding and acceptance.   Dental advisory given  Plan Discussed with: CRNA  Anesthesia Plan Comments:         Anesthesia Quick Evaluation

## 2013-10-08 NOTE — Progress Notes (Signed)
CRITICAL VALUE ALERT  Critical value received:  CK= 9.6  Date of notification:  t  Time of notification:  0631  Critical value read back:yes  Nurse who received alert:  R. Satira Sark, RN  MD notified (1st page): Dr Donata Clay  Time of first page: during report to O.R. Staff, results consistent with previous results  MD notified (2nd page):  Time of second page:  Responding MD:  Dr Donata Clay  Time MD responded:  during report to O.R. Staff, results consistent with previous results

## 2013-10-08 NOTE — Preoperative (Signed)
Beta Blockers   Reason not to administer Beta Blockers:Not Applicable 

## 2013-10-08 NOTE — Op Note (Signed)
The patient was examined and preop studies reviewed. There has been no change from the prior exam and the patient is ready for surgery.   Plan CABG on C [place] today for CAD

## 2013-10-08 NOTE — Transfer of Care (Signed)
Immediate Anesthesia Transfer of Care Note  Patient: Antonio Bernard  Procedure(s) Performed: Procedure(s) with comments: CORONARY ARTERY BYPASS GRAFTING (CABG) (N/A) - Times 2 using left internal mammary artery and endoscopically harvested left saphenous vein INTRAOPERATIVE TRANSESOPHAGEAL ECHOCARDIOGRAM (N/A)  Patient Location: PACU and SICU  Anesthesia Type:General  Level of Consciousness: unresponsive  Airway & Oxygen Therapy: Patient remains intubated per anesthesia plan and Patient placed on Ventilator (see vital sign flow sheet for setting)  Post-op Assessment: Post -op Vital signs reviewed and stable  Post vital signs: Reviewed and stable  Complications: No apparent anesthesia complications

## 2013-10-08 NOTE — Progress Notes (Addendum)
K+= 3.3 and creat= 1.21 w/ urine o/p > 30cc/hr; TCTS KCL protocol initiated with KCL in 50 cc IV x2 each over one hour. Only giving two doses due to Pt going to O.R. for CABG.  ERROR IN ENTRY: Pt only has PIV with meds running, will refer KCL replacement to Dr Donata Clay

## 2013-10-08 NOTE — OR Nursing (Signed)
Patient was a straight back to OR 17 from 2303, with a balloon catheter placed on right inguinal area.  Dr. Donata Clay signed consent and H&P on the OR prior to the patient go to sleep.

## 2013-10-08 NOTE — Anesthesia Postprocedure Evaluation (Signed)
  Anesthesia Post-op Note  Patient: Antonio Bernard  Procedure(s) Performed: Procedure(s) with comments: CORONARY ARTERY BYPASS GRAFTING (CABG) (N/A) - Times 2 using left internal mammary artery and endoscopically harvested left saphenous vein INTRAOPERATIVE TRANSESOPHAGEAL ECHOCARDIOGRAM (N/A)  Patient Location: ICU  Anesthesia Type:General  Level of Consciousness: sedated and unresponsive  Airway and Oxygen Therapy: Patient remains intubated per anesthesia plan and Patient placed on Ventilator (see vital sign flow sheet for setting)  Post-op Pain: none  Post-op Assessment: Post-op Vital signs reviewed, Patient's Cardiovascular Status Stable and Respiratory Function Stable  Post-op Vital Signs: Reviewed and stable  Complications: No apparent anesthesia complications

## 2013-10-08 NOTE — OR Nursing (Signed)
SICU first call @ 1324

## 2013-10-08 NOTE — Brief Op Note (Signed)
      [address]       [place] [address]             [phone]     10/05/2013 - 10/08/2013  12:29 PM  PATIENT:  Antonio Bernard  79 y.o. male  PRE-OPERATIVE DIAGNOSIS:  Coronary Artery Disease  POST-OPERATIVE DIAGNOSIS:  Coronary Artery Disease  PROCEDURE:  Procedure(s): CORONARY ARTERY BYPASS GRAFTING (CABG)X2 LIMA-LAD; SVG-DIAG INTRAOPERATIVE TRANSESOPHAGEAL ECHOCARDIOGRAM [facility] RIGHT THIGH  SURGEON:  Surgeon(s): Kerin Perna, MD  PHYSICIAN ASSISTANT: WAYNE GOLD PA-C  ANESTHESIA:   general  PATIENT CONDITION:  ICU - intubated and hemodynamically stable.  PRE-OPERATIVE WEIGHT: 118kg  COMPLICATIONS: NO KNOWN

## 2013-10-09 ENCOUNTER — Inpatient Hospital Stay (HOSPITAL_COMMUNITY): Payer: Medicare Other

## 2013-10-09 DIAGNOSIS — E8779 Other fluid overload: Secondary | ICD-10-CM

## 2013-10-09 LAB — PREPARE FRESH FROZEN PLASMA
Unit division: 0
Unit division: 0

## 2013-10-09 LAB — POCT I-STAT, CHEM 8
BUN: 24 mg/dL — ABNORMAL HIGH (ref 6–23)
BUN: 24 mg/dL — ABNORMAL HIGH (ref 6–23)
Calcium, Ion: 1.11 mmol/L — ABNORMAL LOW (ref 1.13–1.30)
Chloride: 103 mEq/L (ref 96–112)
Creatinine, Ser: 1.2 mg/dL (ref 0.50–1.35)
Creatinine, Ser: 1.3 mg/dL (ref 0.50–1.35)
Glucose, Bld: 129 mg/dL — ABNORMAL HIGH (ref 70–99)
Potassium: 3.7 mEq/L (ref 3.5–5.1)
Potassium: 3.7 mEq/L (ref 3.5–5.1)
Sodium: 133 mEq/L — ABNORMAL LOW (ref 135–145)
Sodium: 137 mEq/L (ref 135–145)
TCO2: 24 mmol/L (ref 0–100)
TCO2: 25 mmol/L (ref 0–100)

## 2013-10-09 LAB — CBC
HCT: 23.6 % — ABNORMAL LOW (ref 39.0–52.0)
HCT: 22.1 % — ABNORMAL LOW (ref 39.0–52.0)
MCH: 32.1 pg (ref 26.0–34.0)
MCV: 88.8 fL (ref 78.0–100.0)
Platelets: 251 10*3/uL (ref 150–400)
Platelets: 174 10*3/uL (ref 150–400)
RBC: 2.67 MIL/uL — ABNORMAL LOW (ref 4.22–5.81)
RBC: 2.49 MIL/uL — ABNORMAL LOW (ref 4.22–5.81)
RDW: 13.5 % (ref 11.5–15.5)
RDW: 13.9 % (ref 11.5–15.5)
WBC: 11.6 10*3/uL — ABNORMAL HIGH (ref 4.0–10.5)
WBC: 9.7 10*3/uL (ref 4.0–10.5)

## 2013-10-09 LAB — CARBOXYHEMOGLOBIN
Carboxyhemoglobin: 1.4 % (ref 0.5–1.5)
Carboxyhemoglobin: 1.5 % (ref 0.5–1.5)
Methemoglobin: 1.3 % (ref 0.0–1.5)
Methemoglobin: 1.7 % — ABNORMAL HIGH (ref 0.0–1.5)
O2 Saturation: 59.1 %
O2 Saturation: 53.3 %
Total hemoglobin: 8.1 g/dL — ABNORMAL LOW (ref 13.5–18.0)
Total hemoglobin: 8 g/dL — ABNORMAL LOW (ref 13.5–18.0)

## 2013-10-09 LAB — BASIC METABOLIC PANEL
BUN: 26 mg/dL — ABNORMAL HIGH (ref 6–23)
CO2: 24 mEq/L (ref 19–32)
CO2: 28 mEq/L (ref 19–32)
Calcium: 8.1 mg/dL — ABNORMAL LOW (ref 8.4–10.5)
Chloride: 103 mEq/L (ref 96–112)
Chloride: 99 mEq/L (ref 96–112)
Creatinine, Ser: 1.1 mg/dL (ref 0.50–1.35)
GFR calc Af Amer: 89 mL/min — ABNORMAL LOW (ref >90)
GFR calc Af Amer: 73 mL/min — ABNORMAL LOW (ref >90)
GFR calc non Af Amer: 63 mL/min — ABNORMAL LOW (ref >90)
Glucose, Bld: 133 mg/dL — ABNORMAL HIGH (ref 70–99)
Potassium: 3.8 mEq/L (ref 3.5–5.1)
Potassium: 3.7 mEq/L (ref 3.5–5.1)
Sodium: 135 mEq/L (ref 135–145)

## 2013-10-09 LAB — GLUCOSE, CAPILLARY
Glucose-Capillary: 149 mg/dL — ABNORMAL HIGH (ref 70–99)
Glucose-Capillary: 136 mg/dL — ABNORMAL HIGH (ref 70–99)
Glucose-Capillary: 108 mg/dL — ABNORMAL HIGH (ref 70–99)
Glucose-Capillary: 114 mg/dL — ABNORMAL HIGH (ref 70–99)
Glucose-Capillary: 109 mg/dL — ABNORMAL HIGH (ref 70–99)
Glucose-Capillary: 82 mg/dL (ref 70–99)
Glucose-Capillary: 115 mg/dL — ABNORMAL HIGH (ref 70–99)
Glucose-Capillary: 98 mg/dL (ref 70–99)
Glucose-Capillary: 121 mg/dL — ABNORMAL HIGH (ref 70–99)
Glucose-Capillary: 105 mg/dL — ABNORMAL HIGH (ref 70–99)
Glucose-Capillary: 110 mg/dL — ABNORMAL HIGH (ref 70–99)
Glucose-Capillary: 118 mg/dL — ABNORMAL HIGH (ref 70–99)
Glucose-Capillary: 116 mg/dL — ABNORMAL HIGH (ref 70–99)
Glucose-Capillary: 118 mg/dL — ABNORMAL HIGH (ref 70–99)
Glucose-Capillary: 113 mg/dL — ABNORMAL HIGH (ref 70–99)
Glucose-Capillary: 114 mg/dL — ABNORMAL HIGH (ref 70–99)
Glucose-Capillary: 126 mg/dL — ABNORMAL HIGH (ref 70–99)
Glucose-Capillary: 125 mg/dL — ABNORMAL HIGH (ref 70–99)
Glucose-Capillary: 142 mg/dL — ABNORMAL HIGH (ref 70–99)
Glucose-Capillary: 109 mg/dL — ABNORMAL HIGH (ref 70–99)
Glucose-Capillary: 113 mg/dL — ABNORMAL HIGH (ref 70–99)

## 2013-10-09 LAB — PREPARE PLATELET PHERESIS
Unit division: 0
Unit division: 0

## 2013-10-09 LAB — MAGNESIUM
Magnesium: 2.2 mg/dL (ref 1.5–2.5)
Magnesium: 2.1 mg/dL (ref 1.5–2.5)

## 2013-10-09 LAB — BLOOD PRODUCT ORDER (VERBAL) VERIFICATION

## 2013-10-09 MED ORDER — PANTOPRAZOLE SODIUM 40 MG IV SOLR
40.0000 mg | INTRAVENOUS | Status: DC
  Administered 2013-10-09 – 2013-10-17 (×9): 40 mg via INTRAVENOUS
  Filled 2013-10-09 (×12): qty 40

## 2013-10-09 MED ORDER — ENOXAPARIN SODIUM 40 MG/0.4ML ~~LOC~~ SOLN
40.0000 mg | SUBCUTANEOUS | Status: DC
  Administered 2013-10-09 – 2013-10-14 (×6): 40 mg via SUBCUTANEOUS
  Filled 2013-10-09 (×6): qty 0.4

## 2013-10-09 MED ORDER — POTASSIUM CHLORIDE 10 MEQ/50ML IV SOLN
10.0000 meq | INTRAVENOUS | Status: AC
  Administered 2013-10-09 (×2): 10 meq via INTRAVENOUS
  Filled 2013-10-09: qty 50

## 2013-10-09 MED ORDER — DEXMEDETOMIDINE HCL IN NACL 200 MCG/50ML IV SOLN
0.4000 ug/kg/h | INTRAVENOUS | Status: DC
  Administered 2013-10-09: 0.8 ug/kg/h via INTRAVENOUS
  Administered 2013-10-09: 0.9 ug/kg/h via INTRAVENOUS
  Administered 2013-10-09: 0.7 ug/kg/h via INTRAVENOUS
  Filled 2013-10-09 (×2): qty 50
  Filled 2013-10-09: qty 100

## 2013-10-09 MED ORDER — CHLORHEXIDINE GLUCONATE 0.12 % MT SOLN
OROMUCOSAL | Status: AC
  Filled 2013-10-09: qty 15

## 2013-10-09 MED ORDER — HYDROCORTISONE SOD SUCCINATE 100 MG PF FOR IT USE: 100.0000 mg | Freq: Once | INTRAMUSCULAR | Status: DC

## 2013-10-09 MED ORDER — DEXTROSE 5 % IV SOLN
1.0000 g | Freq: Three times a day (TID) | INTRAVENOUS | Status: DC
  Administered 2013-10-09 – 2013-10-17 (×23): 1 g via INTRAVENOUS
  Filled 2013-10-09 (×26): qty 1

## 2013-10-09 MED ORDER — NOREPINEPHRINE BITARTRATE 1 MG/ML IJ SOLN
2.0000 ug/min | INTRAVENOUS | Status: DC
  Administered 2013-10-09: 14 ug/min via INTRAVENOUS
  Filled 2013-10-09 (×3): qty 4

## 2013-10-09 MED ORDER — FUROSEMIDE 10 MG/ML IJ SOLN
10.0000 mg/h | INTRAVENOUS | Status: DC
  Administered 2013-10-09 – 2013-10-11 (×3): 8 mg/h via INTRAVENOUS
  Administered 2013-10-13 – 2013-10-14 (×2): 10 mg/h via INTRAVENOUS
  Filled 2013-10-09 (×10): qty 25

## 2013-10-09 MED ORDER — POTASSIUM CHLORIDE 10 MEQ/50ML IV SOLN
10.0000 meq | Freq: Once | INTRAVENOUS | Status: AC
  Administered 2013-10-09: 10 meq via INTRAVENOUS

## 2013-10-09 MED ORDER — HYDROCORTISONE SOD SUCCINATE 100 MG IJ SOLR
100.0000 mg | Freq: Once | INTRAMUSCULAR | Status: AC
  Administered 2013-10-09: 100 mg via INTRAVENOUS
  Filled 2013-10-09: qty 2

## 2013-10-09 MED ORDER — ALPRAZOLAM 0.5 MG PO TABS
1.0000 mg | ORAL_TABLET | Freq: Three times a day (TID) | ORAL | Status: DC
  Administered 2013-10-09 – 2013-10-11 (×9): 1 mg via ORAL
  Administered 2013-10-12: 0.5 mg via ORAL
  Administered 2013-10-12 (×2): 1 mg via ORAL
  Filled 2013-10-09 (×12): qty 2

## 2013-10-09 MED ORDER — ALBUMIN HUMAN 5 % IV SOLN
INTRAVENOUS | Status: AC
  Administered 2013-10-09: 12.5 g
  Filled 2013-10-09: qty 250

## 2013-10-09 MED ORDER — POTASSIUM CHLORIDE 10 MEQ/50ML IV SOLN
INTRAVENOUS | Status: AC
  Administered 2013-10-09: 10 meq
  Filled 2013-10-09: qty 50

## 2013-10-09 MED ORDER — NOREPINEPHRINE BITARTRATE 1 MG/ML IJ SOLN
2.0000 ug/min | INTRAVENOUS | Status: DC
  Administered 2013-10-09: 6 ug/min via INTRAVENOUS
  Filled 2013-10-09: qty 16

## 2013-10-09 MED ORDER — DEXMEDETOMIDINE HCL IN NACL 200 MCG/50ML IV SOLN
0.4000 ug/kg/h | INTRAVENOUS | Status: DC
  Administered 2013-10-09 – 2013-10-11 (×19): 0.9 ug/kg/h via INTRAVENOUS
  Administered 2013-10-11: 0.902 ug/kg/h via INTRAVENOUS
  Administered 2013-10-11: 0.9 ug/kg/h via INTRAVENOUS
  Filled 2013-10-09: qty 100
  Filled 2013-10-09 (×3): qty 50
  Filled 2013-10-09: qty 100
  Filled 2013-10-09 (×3): qty 50
  Filled 2013-10-09: qty 100
  Filled 2013-10-09 (×2): qty 50
  Filled 2013-10-09: qty 100
  Filled 2013-10-09 (×2): qty 50
  Filled 2013-10-09: qty 100
  Filled 2013-10-09 (×7): qty 50

## 2013-10-09 MED ORDER — CALCIUM CHLORIDE 10 % IV SOLN
1.0000 g | Freq: Once | INTRAVENOUS | Status: AC
  Administered 2013-10-09: 1 g via INTRAVENOUS
  Filled 2013-10-09: qty 10

## 2013-10-09 MED FILL — Potassium Chloride Inj 2 mEq/ML: INTRAVENOUS | Qty: 40 | Status: AC

## 2013-10-09 MED FILL — Sodium Chloride IV Soln 0.9%: INTRAVENOUS | Qty: 1000 | Status: AC

## 2013-10-09 MED FILL — Mannitol IV Soln 20%: INTRAVENOUS | Qty: 500 | Status: AC

## 2013-10-09 MED FILL — Sodium Bicarbonate IV Soln 8.4%: INTRAVENOUS | Qty: 50 | Status: AC

## 2013-10-09 MED FILL — Lidocaine HCl IV Inj 20 MG/ML: INTRAVENOUS | Qty: 5 | Status: AC

## 2013-10-09 MED FILL — Sodium Chloride Irrigation Soln 0.9%: Qty: 3000 | Status: AC

## 2013-10-09 MED FILL — Heparin Sodium (Porcine) Inj 1000 Unit/ML: INTRAMUSCULAR | Qty: 30 | Status: AC

## 2013-10-09 MED FILL — Electrolyte-R (PH 7.4) Solution: INTRAVENOUS | Qty: 4000 | Status: AC

## 2013-10-09 MED FILL — Magnesium Sulfate Inj 50%: INTRAMUSCULAR | Qty: 10 | Status: AC

## 2013-10-09 MED FILL — Heparin Sodium (Porcine) Inj 1000 Unit/ML: INTRAMUSCULAR | Qty: 10 | Status: AC

## 2013-10-09 NOTE — Progress Notes (Signed)
INITIAL NUTRITION ASSESSMENT  DOCUMENTATION CODES Per approved criteria  -Obesity Unspecified   INTERVENTION:  If EN started, recommend Vital HP formula -- initiate at 20 ml/hr and increase by 10 ml every 4 hours to goal rate of 40 ml/hr with Prostat liquid protein 60 ml TID via tube to provide 1560 kcals (68% of estimated kcal needs), 174 gm protein (100% of estimated protein needs), 778 ml of free water RD to follow for nutrition care plan  NUTRITION DIAGNOSIS: Inadequate oral intake related to inability to eat as evidenced by NPO status  Goal: Initiate EN support within next 24-48 hours if prolonged intubation expected  Monitor:  EN initiation & tolerance, respiratory status, weight, labs, I/O's  Reason for Assessment: VDRF  79 y.o. male  Admitting Dx: ST elevation myocardial infarction (STEMI) of anterior wall, initial episode of care  ASSESSMENT: Patient with PMH of HTN, OSA, obesity, AAA with anxiety; patient developed substernal chest pressure and his daughter called EMS; brought emergently to cardiac cath lab where he was evaluated -- CVTS consulted.  Patient s/p procedures 11/4: CORONARY ARTERY BYPASS GRAFTING (CABG)  INTRAOPERATIVE TRANSESOPHAGEAL ECHOCARDIOGRAM  [facility] RIGHT THIGH  Patient is currently intubated on ventilator support -- OGT in place MV: 10.5 Temp: 38.0  Height: Ht Readings from Last 1 Encounters:  10/07/13 6\' 1"  (1.854 m)    Weight: Wt Readings from Last 1 Encounters:  10/09/13 285 lb 7.9 oz (129.5 kg)    Ideal Body Weight: 184 lb  % Ideal Body Weight: 154%  Wt Readings from Last 10 Encounters:  10/09/13 285 lb 7.9 oz (129.5 kg)  10/09/13 285 lb 7.9 oz (129.5 kg)  10/09/13 285 lb 7.9 oz (129.5 kg)  07/19/12 235 lb (106.595 kg)    Usual Body Weight: unable to obtain  % Usual Body Weight: ---  BMI:  Body mass index is 37.67 kg/(m^2).  Estimated Nutritional Needs: Kcal: 2300 Protein: 165-175 gm Fluid: per MD  Skin: chest &  leg surgical incisions  Diet Order: NPO  EDUCATION NEEDS: -No education needs identified at this time   Intake/Output Summary (Last 24 hours) at 10/09/13 1421 Last data filed at 10/09/13 1300  Gross per 24 hour  Intake 7030.17 ml  Output   2350 ml  Net 4680.17 ml    Labs:   Recent Labs Lab 10/07/13 0400 10/08/13 0415  10/08/13 1331 10/08/13 1528 10/08/13 2100 10/09/13 0400  NA 137 135  < > 132* 135  --  136  K 3.2* 3.3*  < > 4.3 3.5  --  3.8  CL 100 97  --   --   --   --  103  CO2 29 29  --   --   --   --  24  BUN 29* 30*  --   --   --   --  27*  CREATININE 1.27 1.21  --   --   --  0.96 0.99  CALCIUM 8.3* 8.6  --   --   --   --  7.8*  MG  --   --   --   --   --  3.0* 2.2  GLUCOSE 126* 89  < > 171* 182*  --  98  < > = values in this interval not displayed.  CBG (last 3)   Recent Labs  10/09/13 0804 10/09/13 0908 10/09/13 1010  GLUCAP 113* 121* 105*    Scheduled Meds: . acetaminophen  1,000 mg Oral Q6H   Or  .  acetaminophen (TYLENOL) oral liquid 160 mg/5 mL  1,000 mg Per Tube Q6H  . ALPRAZolam  1 mg Oral TID  . aspirin EC  325 mg Oral Daily   Or  . aspirin  324 mg Per Tube Daily  . atorvastatin  80 mg Oral q1800  . bisacodyl  10 mg Oral Daily   Or  . bisacodyl  10 mg Rectal Daily  . cefUROXime (ZINACEF)  IV  1.5 g Intravenous Q12H  . docusate sodium  200 mg Oral Daily  . enoxaparin (LOVENOX) injection  40 mg Subcutaneous Q24H  . insulin regular  0-10 Units Intravenous TID WC  . levalbuterol  1.25 mg Nebulization Q6H  . metoprolol tartrate  12.5 mg Oral BID   Or  . metoprolol tartrate  12.5 mg Per Tube BID  . pantoprazole (PROTONIX) IV  40 mg Intravenous Q24H  . sodium chloride  3 mL Intravenous Q12H  . vancomycin  1,000 mg Intravenous Q12H    Continuous Infusions: . sodium chloride 20 mL/hr at 10/08/13 1607  . sodium chloride 10 mL/hr (10/09/13 0937)  . sodium chloride    . dexmedetomidine 0.8 mcg/kg/hr (10/09/13 1300)  . DOPamine 5  mcg/kg/min (10/09/13 1300)  . furosemide (LASIX) infusion 8 mg/hr (10/09/13 0916)  . insulin (NOVOLIN-R) infusion 5.5 Units/hr (10/09/13 1005)  . lactated ringers 20 mL/hr at 10/08/13 1515  . milrinone 0.2 mcg/kg/min (10/09/13 1300)  . nitroGLYCERIN Stopped (10/08/13 1515)  . norepinephrine (LEVOPHED) Adult infusion    . phenylephrine (NEO-SYNEPHRINE) Adult infusion Stopped (10/08/13 1700)    Past Medical History  Diagnosis Date  . Acid reflux   . Family history of anesthesia complication     daughter had problem waking up  . Pneumonia     hx of  . Sleep apnea     no treatment for, last sleep study over 5 years ago  . Abdominal aortic aneurysm     found 06/29/12 "small"  . Headache(784.0)   . Herniated vertebral disc     thoracic area  . Cataracts, bilateral     hx of  . H/O Legionnaire's disease     about 24 years ago  . Anxiety     panic attacks   . Claustrophobia     "severe"  . Hypertension     sees  Dr. Ronne Binning [phone]  . Obesity (BMI 35.0-39.9 without comorbidity) 10/06/2013  . Essential hypertension 10/06/2013  . Anginal pain     Past Surgical History  Procedure Laterality Date  . Shoulder surgery      left  . Cataract extraction w/ intraocular lens implant      bilateral  . Lumbar laminectomy/decompression microdiscectomy  07/20/2012    Procedure: LUMBAR LAMINECTOMY/DECOMPRESSION MICRODISCECTOMY 1 LEVEL;  Surgeon: Reinaldo Meeker, MD;  Location: [facility];  Service: Neurosurgery;  Laterality: N/A;  lumbar four-five left  . Back surgery    . Cardiac catheterization  10/05/2013  . Eye surgery      Maureen Chatters, RD, LDN Pager #: [phone] After-Hours Pager #: [phone]

## 2013-10-09 NOTE — Progress Notes (Signed)
Radiology report Of IABP placement called and given to Dr Donata Clay

## 2013-10-09 NOTE — Progress Notes (Signed)
SBP decreased to 56 with Heart rate 87 albumin started and levophed increased to 14 mcgs Dr Donata Clay notified and at bedside IABP increased to 1:1 from 1:3 and 1 amp calcium chloride addedto albumin per orders SBP back to 90's

## 2013-10-09 NOTE — Progress Notes (Signed)
[facility] DAILY ICU PROGRESS NOTE                   [address]            [place] [address]          [phone]   1 Day Post-Op Procedure(s) (LRB): CORONARY ARTERY BYPASS GRAFTING (CABG) (N/A) INTRAOPERATIVE TRANSESOPHAGEAL ECHOCARDIOGRAM (N/A)  Total Length of Stay:  LOS: 4 days   Subjective: Has been agitated on vent overnight, but presently opens eyes and follow commands.  Appears comfortable.   Objective: Vital signs in last 24 hours: Temp:  [97 F (36.1 C)-101.7 F (38.7 C)] 101.7 F (38.7 C) (11/05 0630) Pulse Rate:  [92-106] 104 (11/05 0630) Cardiac Rhythm:  [-] Sinus tachycardia (11/05 0630) Resp:  [0-24] 16 (11/05 0630) BP: (100-122)/(46-60) 120/60 mmHg (11/05 0630) SpO2:  [93 %-100 %] 93 % (11/05 0630) Arterial Line BP: (80-119)/(44-67) 96/67 mmHg (11/05 0630) FiO2 (%):  [50 %-100 %] 50 % (11/05 0630) Weight:  [285 lb 7.9 oz (129.5 kg)] 285 lb 7.9 oz (129.5 kg) (11/05 0630)  Filed Weights   10/05/13 2304 10/08/13 0600 10/09/13 0630  Weight: 250 lb (113.399 kg) 261 lb 14.5 oz (118.8 kg) 285 lb 7.9 oz (129.5 kg)  PRE-OPERATIVE WEIGHT: 118kg   Weight change: 23 lb 9.4 oz (10.7 kg)   Hemodynamic parameters for last 24 hours: PAP: (22-32)/(13-21) 30/21 mmHg CO:  [5.1 L/min-6.2 L/min] 6.1 L/min CI:  [2.1 L/min/m2-2.6 L/min/m2] 2.5 L/min/m2  Intake/Output from previous day: 11/04 0701 - 11/05 0700 In: 9892.6 [I.V.:6899.6; Blood:1713; NG/GT:30; IV Piggyback:1250] Out: 3850 [Urine:2035; Blood:1200; Chest Tube:615]  CBGs [id number]    Current Meds: Scheduled Meds: . acetaminophen  1,000 mg Oral Q6H   Or  . acetaminophen (TYLENOL) oral liquid 160 mg/5 mL  1,000 mg Per Tube Q6H  . aspirin EC  325 mg Oral Daily   Or  . aspirin  324 mg Per Tube Daily  . atorvastatin  80 mg Oral q1800  . bisacodyl  10 mg Oral Daily   Or  . bisacodyl  10 mg Rectal Daily  . cefUROXime (ZINACEF)  IV  1.5 g Intravenous Q12H  . docusate sodium  200 mg  Oral Daily  . insulin regular  0-10 Units Intravenous TID WC  . levalbuterol  1.25 mg Nebulization Q6H  . metoprolol tartrate  12.5 mg Oral BID   Or  . metoprolol tartrate  12.5 mg Per Tube BID  . [START ON 10/10/2013] pantoprazole  40 mg Oral Daily  . sodium chloride  3 mL Intravenous Q12H  . vancomycin  1,000 mg Intravenous Q12H   Continuous Infusions: . sodium chloride 20 mL/hr at 10/08/13 1607  . sodium chloride 20 mL/hr at 10/08/13 1515  . sodium chloride    . dexmedetomidine 0.7 mcg/kg/hr (10/09/13 2952)  . DOPamine 5 mcg/kg/min (10/08/13 2200)  . insulin (NOVOLIN-R) infusion 7.6 mL/hr at 10/08/13 1842  . lactated ringers 20 mL/hr at 10/08/13 1515  . milrinone 0.2 mcg/kg/min (10/09/13 0600)  . nitroGLYCERIN Stopped (10/08/13 1515)  . norepinephrine (LEVOPHED) Adult infusion 10 mcg/min (10/09/13 0600)  . phenylephrine (NEO-SYNEPHRINE) Adult infusion Stopped (10/08/13 1700)   PRN Meds:.albumin human, metoprolol, midazolam, morphine injection, ondansetron (ZOFRAN) IV, oxyCODONE, sodium chloride   Physical Exam: General appearance: On vent, awakens and follows commands Heart: regular rate and rhythm Lungs: Coarse rhonchi bilaterally Abdomen: Soft, no masses Extremities: Mild LE edema Wound: Dressed and dry    Lab Results: CBC:  Recent Labs  10/08/13 2100 10/09/13 0400  WBC 14.4* 11.6*  HGB 9.1* 8.4*  HCT 25.4* 23.6*  PLT 267 251   BMET:  Recent Labs  10/08/13 0415  10/08/13 1528 10/08/13 2100 10/09/13 0400  NA 135  < > 135  --  136  K 3.3*  < > 3.5  --  3.8  CL 97  --   --   --  103  CO2 29  --   --   --  24  GLUCOSE 89  < > 182*  --  98  BUN 30*  --   --   --  27*  CREATININE 1.21  --   --  0.96 0.99  CALCIUM 8.6  --   --   --  7.8*  < > = values in this interval not displayed.  PT/INR:  Recent Labs  10/08/13 1500  LABPROT 16.7*  INR 1.39   Radiology: Dg Chest Portable 1 View  10/08/2013   CLINICAL DATA:  postop cardiac surgery  EXAM: PORTABLE  CHEST - 1 VIEW  COMPARISON:  DG CHEST 1V PORT dated 10/06/2013  FINDINGS: Interval midline sternotomy. The endotracheal tube is positioned 5 cm in the carinal. NG tube extends along the course of the esophagus with tip below the margin film. Swan-Ganz catheter with tip in the right main pulmonary artery. Left central venous line with tip in distal SVC. Left chest tube in place with no evidence of pneumothorax. Central venous congestion noted. Interval increase in mild basilar atelectasis.  IMPRESSION: 1. Postoperative support apparatus appears in good position. 2. Increase in basilar atelectasis in central venous congestion.   Electronically Signed   By: Genevive Bi M.D.   On: 10/08/2013 15:50     Assessment/Plan: S/P Procedure(s) (LRB): CORONARY ARTERY BYPASS GRAFTING (CABG) (N/A) INTRAOPERATIVE TRANSESOPHAGEAL ECHOCARDIOGRAM (N/A) CV- Overall stable.  Will start to wean balloon pump and pressors and d/c as able.   Pulm- aggressive toilet, IV Lasix, will give a dose of steroids this am.  Hopefully can extubate once IABP is out. Vol overload- diurese. Hypokalemia- replace K+. Expected postop blood loss anemia- watch H/H. Fevers overnight ,WBC stable.  Monitor.  Will check sputum culture. Routine POD#1 ICU care.  Junie Avilla H 10/09/2013 7:40 AM

## 2013-10-09 NOTE — Procedures (Signed)
Arterial Catheter Insertion Procedure Note Antonio Bernard [id number] 26-Jul-1933  Procedure: Insertion of Arterial Catheter  Indications: Blood pressure monitoring and Frequent blood sampling  Procedure Details Consent: Risks of procedure as well as the alternatives and risks of each were explained to the (patient/caregiver).  Consent for procedure obtained. Time Out: Verified patient identification, verified procedure, site/side was marked, verified correct patient position, special equipment/implants available, medications/allergies/relevent history reviewed, required imaging and test results available.  Performed  Maximum sterile technique was used including antiseptics, cap, gloves, gown, hand hygiene, mask and sheet. Skin prep: Chlorhexidine; local anesthetic administered 20 gauge catheter was inserted into right radial artery using the Seldinger technique.  Evaluation Blood flow good; BP tracing good. Complications: No apparent complications.   Lysbeth Penner [facility] 10/09/2013

## 2013-10-09 NOTE — Progress Notes (Signed)
[facility] Progress Note  Subjective:  Day 1 s/p CABG x 2; intubated, sedated. Follows commands.   Objective:   Vital Signs in the last 24 hours: Temp:  [97 F (36.1 C)-102 F (38.9 C)] 100.8 F (38.2 C) (11/05 1300) Pulse Rate:  [44-172] 110 (11/05 1300) Resp:  [0-30] 18 (11/05 1300) BP: (91-126)/(46-72) 126/60 mmHg (11/05 1230) SpO2:  [87 %-100 %] 98 % (11/05 1300) Arterial Line BP: (69-132)/(44-128) 103/97 mmHg (11/05 1300) FiO2 (%):  [50 %-100 %] 70 % (11/05 1300) Weight:  [285 lb 7.9 oz (129.5 kg)] 285 lb 7.9 oz (129.5 kg) (11/05 0630)  Intake/Output from previous day: 11/04 0701 - 11/05 0700 In: 10142.6 [I.V.:6899.6; Blood:1713; NG/GT:30; IV Piggyback:1500] Out: 3850 [Urine:2035; Blood:1200; Chest Tube:615]  Scheduled: . acetaminophen  1,000 mg Oral Q6H   Or  . acetaminophen (TYLENOL) oral liquid 160 mg/5 mL  1,000 mg Per Tube Q6H  . ALPRAZolam  1 mg Oral TID  . aspirin EC  325 mg Oral Daily   Or  . aspirin  324 mg Per Tube Daily  . atorvastatin  80 mg Oral q1800  . bisacodyl  10 mg Oral Daily   Or  . bisacodyl  10 mg Rectal Daily  . cefUROXime (ZINACEF)  IV  1.5 g Intravenous Q12H  . docusate sodium  200 mg Oral Daily  . enoxaparin (LOVENOX) injection  40 mg Subcutaneous Q24H  . insulin regular  0-10 Units Intravenous TID WC  . levalbuterol  1.25 mg Nebulization Q6H  . metoprolol tartrate  12.5 mg Oral BID   Or  . metoprolol tartrate  12.5 mg Per Tube BID  . pantoprazole (PROTONIX) IV  40 mg Intravenous Q24H  . sodium chloride  3 mL Intravenous Q12H  . vancomycin  1,000 mg Intravenous Q12H   . sodium chloride 20 mL/hr at 10/08/13 1607  . sodium chloride 10 mL/hr (10/09/13 0937)  . sodium chloride    . dexmedetomidine 0.8 mcg/kg/hr (10/09/13 1200)  . DOPamine 5 mcg/kg/min (10/09/13 1200)  . furosemide (LASIX) infusion 8 mg/hr (10/09/13 0916)  . insulin (NOVOLIN-R) infusion 5.5 Units/hr (10/09/13 1005)  . lactated ringers 20  mL/hr at 10/08/13 1515  . milrinone 0.2 mcg/kg/min (10/09/13 1200)  . nitroGLYCERIN Stopped (10/08/13 1515)  . norepinephrine (LEVOPHED) Adult infusion    . phenylephrine (NEO-SYNEPHRINE) Adult infusion Stopped (10/08/13 1700)   Physical Exam:   General appearance: no distress Neck: no JVD Lungs: decreased BS bases Heart: tachycardic at 110; 1/6 sem Abdomen: sift; bs +} Extremities: mild edema   Rate: 110  Rhythm: sinus  Currently on IABP at 1:1  Lab Results:    Recent Labs  10/08/13 0415  10/08/13 1528 10/08/13 2100 10/09/13 0400  NA 135  < > 135  --  136  K 3.3*  < > 3.5  --  3.8  CL 97  --   --   --  103  CO2 29  --   --   --  24  GLUCOSE 89  < > 182*  --  98  BUN 30*  --   --   --  27*  CREATININE 1.21  --   --  0.96 0.99  < > = values in this interval not displayed.  Recent Labs  10/06/13 1630 10/06/13 2130  TROPONINI >20.00* >20.00*   Hepatic Function Panel  Recent Labs  10/07/13 0400  PROT 5.5*  ALBUMIN 3.0*  AST 352*  ALT 97*  ALKPHOS  41  BILITOT 0.8    Recent Labs  10/08/13 1500  INR 1.39   BNP (last 3 results)  Recent Labs  10/07/13 0400  PROBNP 3092.0*   Lipid Panel  Lipid Panel  No results found for this basename: chol, trig, hdl, cholhdl, vldl, ldlcalc      Imaging:  Dg Chest Portable 1 View In Am  10/09/2013   CLINICAL DATA:  Postop CABG.  EXAM: PORTABLE CHEST - 1 VIEW  COMPARISON:  10/08/2013.  FINDINGS: Endotracheal tube is in satisfactory position. Nasogastric tube is followed into the stomach with the tip projecting beyond the inferior margin of the image. Left IJ Swan-Ganz catheter tip projects over the right pulmonary artery. Mediastinal drain and 1, possibly 2, chest tube(s) are seen on the left. The tip of an intra-aortic balloon pump projects over the transverse portion of the aorta. There are 7 intact sternotomy wires.  Lungs are low in volume with bibasilar atelectasis, left greater than right. Overall aeration  has improved slightly in the interval. No definite pneumothorax. Question small left pleural effusion.  IMPRESSION: 1. Overall improving aeration with persistent bibasilar atelectasis, left greater than right. 2. Tip of the intra-aortic balloon pump projects over the transverse aorta. Please correlate clinically as positioning over the proximal descending thoracic aorta is typical, as on the prior exam. These results will be called to the ordering clinician or representative by the Radiologist Assistant, and communication documented in the PACS Dashboard. 3. No definite pneumothorax with left chest tube or tubes in place. 4. Possible tiny left pleural effusion.   Electronically Signed   By: Leanna Battles M.D.   On: 10/09/2013 08:02   Dg Chest Portable 1 View  10/08/2013   CLINICAL DATA:  postop cardiac surgery  EXAM: PORTABLE CHEST - 1 VIEW  COMPARISON:  DG CHEST 1V PORT dated 10/06/2013  FINDINGS: Interval midline sternotomy. The endotracheal tube is positioned 5 cm in the carinal. NG tube extends along the course of the esophagus with tip below the margin film. Swan-Ganz catheter with tip in the right main pulmonary artery. Left central venous line with tip in distal SVC. Left chest tube in place with no evidence of pneumothorax. Central venous congestion noted. Interval increase in mild basilar atelectasis.  IMPRESSION: 1. Postoperative support apparatus appears in good position. 2. Increase in basilar atelectasis in central venous congestion.   Electronically Signed   By: Genevive Bi M.D.   On: 10/08/2013 15:50      Assessment/Plan:   Principal Problem:   STEMI of anterior wall,  10/05/13 Active Problems:   Essential hypertension   Obesity (BMI 35.0-39.9 without comorbidity)   S/P urgent LAD PTCA 10/06/13 with IABP for LAD perfusion   Anxiety, claustrophobia   DJD  lumbar- surg August 2013   Sleep apnea- C-pap intol   AAA (abdominal aortic aneurysm)-hx of a "small" AAA found July 2013  (nothing in EPIC)  DAy 1 s/p CABG x 2/ On IABP at 1:1 and pressors. Intubated and sedated. Mild fever overnight. I/O +813 today; + [id number] since admission. Continue diuresis.    Lennette Bihari, MD, [facility] 10/09/2013, 1:01 PM

## 2013-10-09 NOTE — Op Note (Signed)
NAMEHENDRICKS, SCHWANDT NO.:  [id number]  MEDICAL RECORD NO.:  [id number]  LOCATION:  2S03C                        FACILITY:  [facility]  PHYSICIAN:  Kerin Perna, M.D.  DATE OF BIRTH:  Mar 26, 1933  DATE OF PROCEDURE:  10/08/2013 DATE OF DISCHARGE:                              OPERATIVE REPORT   OPERATIONS: 1. Coronary artery bypass grafting x2 (left internal mammary artery     LAD, saphenous vein graft to diagonal). 2. Endoscopic harvest of right leg greater saphenous vein.  PREOPERATIVE DIAGNOSES:  Acute anterior myocardial infarction with left ventricular dysfunction and preoperative balloon pump, single-vessel coronary artery disease.  POSTOPERATIVE DIAGNOSES:  Acute anterior myocardial infarction with left ventricular dysfunction and preoperative balloon pump, single-vessel coronary artery disease.  SURGEON:  Kerin Perna, M.D.  ASSISTANT:  Rowe Clack, P.A.-C.  ANESTHESIA:  General by Dr. Autumn Patty.  INDICATIONS:  The patient is a 79 year old Caucasian male who presented to the hospital after 3-4 days of progressively increasing chest pain, weakness, and nausea.  His cardiac enzymes were positive and EKG showed anterior EKG changes.  He underwent urgent cardiac catheterization for ST elevation MI.  This demonstrated occlusion of the proximal LAD.  This was opened by Dr. Herbie Baltimore with PTCA.  A stent was not placed due to the long length of the stenosis of the reperfused LAD.  His chest pain improved immediately; however, his blood pressure was low and a balloon pump was placed in the cath lab.  He was loaded with Brilinta anti- platelet agent, which was converted to IV Integrilin and surgical coronary revascularization was recommended.  I examined the patient in his intensive care room and reviewed the results of cardiac catheterization and 2D echocardiogram with the patient and family.  I discussed the indications and expected benefits of  coronary artery bypass grafting for treatment of his severe post MI and CAD.  I reviewed the major aspects of surgery including the location of the incisions, the use of general anesthesia and cardiopulmonary bypass, and the expected postoperative hospital recovery.  I discussed with the patient risks to him of this operation including risks of further MI, stroke, bleeding, blood transfusion requirement, multisystem failure, and death. After reviewing these issues, he demonstrated his understanding and agreed to proceed with surgery under what I felt was an informed consent.  FINDINGS: 1. Hemorrhagic infarct of the anterior wall and septum. 2. Adequate targets for grafting. 3. Adequate conduit for grafting. 4. Intraoperative thrombocytopenia with platelet count less than     80,000 requiring platelet transfusion.  OPERATIVE PROCEDURE:  The patient was brought to the operating room and placed supine on the operating table.  General anesthesia was induced under invasive hemodynamic monitoring.  The transesophageal echo probe was placed by the anesthesiologist which confirmed the preoperative diagnosis of anterior wall MI.  There was no significant valvular disease.  The patient was prepped and draped as a sterile field.  A proper time-out was performed.  The saphenous vein was harvested endoscopically from the right leg.  A sternal incision was made and the chest wall retractor placed.  The left internal mammary artery was harvested as a pedicle graft from its origin at the subclavian  vessels. It was a good vessel with excellent flow.  The sternal retractor was placed, and the pericardium was opened and suspended.  At this point, it was apparent that the patient had a severe leak in the cuff of the endotracheal tube and replaced with the endotracheal tube was going to be necessary.  I discussed the situation with the patient's anesthesiologist.  We decided to place the patient on  cardiopulmonary bypass and then exchanged the endotracheal tube.  The patient was heparinized and pursestrings were placed in the ascending aorta and right atrium and the patient was cannulated.  When the ACT was documented as being therapeutic, we placed the patient on cardiopulmonary bypass.  He remained stable until that time of placing him on bypass.  The endotracheal tube was then exchanged by the anesthesiologist without difficulty.  The coronaries were identified for grafting.  The anterior wall had a compressive hemorrhagic infarct.  However on 2D echo there was only mild global hypokinesia.  Cardioplegia cannulas were placed both antegrade and retrograde cold blood cardioplegia.  The patient was cooled to 32 degrees.  The mammary artery and vein graft were prepared for the distal anastomoses.  A crossclamp was applied.  One liter of cold blood cardioplegia was delivered in split doses between the antegrade aortic and retrograde coronary sinus catheters.  There was good cardioplegic arrest.  Septal temperature dropped less than 15 degrees.  Cardioplegia was delivered every 20 minutes.  The first distal anastomosis was to the diagonal branch to LAD.  It had a tight proximal stenosis.  A reverse saphenous vein was sewn end-to- side using running 7-0 Prolene to this 1.5-mm vessel and there was good flow through graft.  Cardioplegia was redosed.  The second distal anastomosis was to the distal third of the LAD.  It was 1.7-mm vessel proximal high-grade stenosis.  The left IMA pedicle was brought through an opening in the left lateral pericardium, was brought down onto the LAD and sewn end-to-side with running 8-0 Prolene. There was good flow through the anastomosis.  The LAD was somewhat intramyocardial.  The pedicle was secured to the epicardium with 6-0 Prolene.  Then, the pedicle clamp placed on the mammary pedicle.  While the crossclamp was still in place, a proximal  vein anastomosis was performed using a 4.5 mm punch with running 6-0 Prolene.  Air was vented from the coronaries with a dose of retrograde warm blood cardioplegia and the crossclamp was removed.  The heart resumed a spontaneous rhythm.  The proximal and distal anastomoses were checked and found to be hemostatic.  Temporary pacing wires were applied.  The lungs were expanded and the ventilator was resumed.  When the patient was rewarmed, he was weaned off cardiopulmonary bypass on low-dose dopamine and milrinone.  Echo showed adequate LV global function.  There was no significant valvular regurgitation.  However, PA pressures were fairly hot and blood pressure is somewhat low.  Systolic pressures were maintained over 85 mmHg.  This appeared the patient had some diastolic dysfunction of his left ventricle probably from the reperfusion of his hemorrhagic MI.  We will place the patient on nitric oxide and also when the patient developed a sinus rhythm instead of a paced rhythm, his filling pressures improved as well.  The patient remained hemodynamically stable and protamine was administered slowly. Platelets were also given due to a low platelet count following separation from bypass and reversal of heparin.  The mediastinum was irrigated.  The superior pericardial fat was  closed over the aorta.  The anterior and posterior mediastinal tubes and a left pleural chest tube were placed and brought through separate incisions.  The sternum was closed with wire.  The pectoralis fascia was closed with interrupted #1 Vicryl.  The subcutaneous and skin layers were closed with running Vicryl.  Sterile dressings were applied.  Total cardiopulmonary bypass time was 87 minutes.     Kerin Perna, M.D.     PV/MEDQ  D:  10/08/2013  T:  10/09/2013  Job:  [id number]  cc:   Landry Corporal, MD

## 2013-10-09 NOTE — Progress Notes (Signed)
Patient ID: Antonio Bernard, male   DOB: [birth year], 79 y.o.   MRN: [id number] EVENING ROUNDS NOTE :     [address]       [place] [address]             [phone]                 1 Day Post-Op Procedure(s) (LRB): CORONARY ARTERY BYPASS GRAFTING (CABG) (N/A) INTRAOPERATIVE TRANSESOPHAGEAL ECHOCARDIOGRAM (N/A)  Total Length of Stay:  LOS: 4 days  BP 130/56  Pulse 107  Temp(Src) 100.2 F (37.9 C) (Core (Comment))  Resp 18  Ht 6\' 1"  (1.854 m)  Wt 285 lb 7.9 oz (129.5 kg)  BMI 37.67 kg/m2  SpO2 100%  .Intake/Output     11/04 0701 - 11/05 0700 11/05 0701 - 11/06 0700   P.O.     I.V. (mL/kg) 6899.6 (53.3) 1186.4 (9.2)   Blood 1713    NG/GT 30 80   IV Piggyback 1500 550   Total Intake(mL/kg) 10142.6 (78.3) 1816.4 (14)   Urine (mL/kg/hr) 2035 (0.7) 950 (0.8)   Blood 1200 (0.4)    Chest Tube 615 (0.2) 200 (0.2)   Total Output 3850 1150   Net +6292.6 +666.4          . sodium chloride 20 mL/hr at 10/08/13 1607  . sodium chloride 10 mL/hr (10/09/13 0937)  . sodium chloride    . dexmedetomidine 0.9 mcg/kg/hr (10/09/13 1600)  . DOPamine 5 mcg/kg/min (10/09/13 1600)  . furosemide (LASIX) infusion 8 mg/hr (10/09/13 1000)  . insulin (NOVOLIN-R) infusion 5.5 Units/hr (10/09/13 1005)  . lactated ringers 20 mL/hr at 10/08/13 1515  . milrinone 0.2 mcg/kg/min (10/09/13 1600)  . nitroGLYCERIN Stopped (10/08/13 1515)  . norepinephrine (LEVOPHED) Adult infusion    . phenylephrine (NEO-SYNEPHRINE) Adult infusion Stopped (10/08/13 1700)     Lab Results  Component Value Date   WBC 11.6* 10/09/2013   HGB 8.4* 10/09/2013   HCT 23.6* 10/09/2013   PLT 251 10/09/2013   GLUCOSE 98 10/09/2013   ALT 97* 10/07/2013   AST 352* 10/07/2013   NA 136 10/09/2013   K 3.8 10/09/2013   CL 103 10/09/2013   CREATININE 0.99 10/09/2013   BUN 27* 10/09/2013   CO2 24 10/09/2013   TSH 1.305 10/07/2013   INR 1.39 10/08/2013   HGBA1C 5.5 10/06/2013   Remains sedated on vent, attempt at  weaning NO and IAB resulted in decreased hr and bp, IABg back to 1:1 and NO remains on  Delight Ovens MD  Beeper [phone] Office [phone] 10/09/2013 4:20 PM

## 2013-10-10 ENCOUNTER — Encounter (HOSPITAL_COMMUNITY): Payer: Self-pay | Admitting: Cardiothoracic Surgery

## 2013-10-10 ENCOUNTER — Inpatient Hospital Stay (HOSPITAL_COMMUNITY): Payer: Medicare Other

## 2013-10-10 DIAGNOSIS — Z951 Presence of aortocoronary bypass graft: Secondary | ICD-10-CM

## 2013-10-10 LAB — CARBOXYHEMOGLOBIN
Carboxyhemoglobin: 1 % (ref 0.5–1.5)
Methemoglobin: 1.1 % (ref 0.0–1.5)
O2 Saturation: 49.8 %
Total hemoglobin: 12.7 g/dL — ABNORMAL LOW (ref 13.5–18.0)

## 2013-10-10 LAB — POCT I-STAT 4, (NA,K, GLUC, HGB,HCT)
Glucose, Bld: 178 mg/dL — ABNORMAL HIGH (ref 70–99)
Hemoglobin: 9.2 g/dL — ABNORMAL LOW (ref 13.0–17.0)
Potassium: 3.6 mEq/L (ref 3.5–5.1)
Sodium: 135 mEq/L (ref 135–145)

## 2013-10-10 LAB — GLUCOSE, CAPILLARY
Glucose-Capillary: 97 mg/dL (ref 70–99)
Glucose-Capillary: 111 mg/dL — ABNORMAL HIGH (ref 70–99)
Glucose-Capillary: 103 mg/dL — ABNORMAL HIGH (ref 70–99)
Glucose-Capillary: 111 mg/dL — ABNORMAL HIGH (ref 70–99)
Glucose-Capillary: 117 mg/dL — ABNORMAL HIGH (ref 70–99)
Glucose-Capillary: 116 mg/dL — ABNORMAL HIGH (ref 70–99)
Glucose-Capillary: 121 mg/dL — ABNORMAL HIGH (ref 70–99)
Glucose-Capillary: 112 mg/dL — ABNORMAL HIGH (ref 70–99)
Glucose-Capillary: 139 mg/dL — ABNORMAL HIGH (ref 70–99)
Glucose-Capillary: 136 mg/dL — ABNORMAL HIGH (ref 70–99)
Glucose-Capillary: 148 mg/dL — ABNORMAL HIGH (ref 70–99)
Glucose-Capillary: 126 mg/dL — ABNORMAL HIGH (ref 70–99)
Glucose-Capillary: 111 mg/dL — ABNORMAL HIGH (ref 70–99)

## 2013-10-10 LAB — COMPREHENSIVE METABOLIC PANEL
ALT: 50 U/L (ref 0–53)
AST: 50 U/L — ABNORMAL HIGH (ref 0–37)
Albumin: 2.7 g/dL — ABNORMAL LOW (ref 3.5–5.2)
Alkaline Phosphatase: 39 U/L (ref 39–117)
BUN: 25 mg/dL — ABNORMAL HIGH (ref 6–23)
CO2: 27 mEq/L (ref 19–32)
Calcium: 7.7 mg/dL — ABNORMAL LOW (ref 8.4–10.5)
Chloride: 100 mEq/L (ref 96–112)
Creatinine, Ser: 1.1 mg/dL (ref 0.50–1.35)
GFR calc Af Amer: 73 mL/min — ABNORMAL LOW (ref >90)
GFR calc non Af Amer: 63 mL/min — ABNORMAL LOW (ref >90)
Glucose, Bld: 115 mg/dL — ABNORMAL HIGH (ref 70–99)
Potassium: 3.4 mEq/L — ABNORMAL LOW (ref 3.5–5.1)
Sodium: 137 mEq/L (ref 135–145)
Total Bilirubin: 2.5 mg/dL — ABNORMAL HIGH (ref 0.3–1.2)
Total Protein: 5.3 g/dL — ABNORMAL LOW (ref 6.0–8.3)

## 2013-10-10 LAB — CBC
HCT: 24 % — ABNORMAL LOW (ref 39.0–52.0)
HCT: 28.2 % — ABNORMAL LOW (ref 39.0–52.0)
Hemoglobin: 8.4 g/dL — ABNORMAL LOW (ref 13.0–17.0)
Hemoglobin: 10.1 g/dL — ABNORMAL LOW (ref 13.0–17.0)
MCH: 31.3 pg (ref 26.0–34.0)
MCH: 31.3 pg (ref 26.0–34.0)
MCHC: 35 g/dL (ref 30.0–36.0)
MCHC: 35.8 g/dL (ref 30.0–36.0)
MCV: 89.6 fL (ref 78.0–100.0)
MCV: 87.3 fL (ref 78.0–100.0)
Platelets: 163 10*3/uL (ref 150–400)
Platelets: 169 10*3/uL (ref 150–400)
RBC: 2.68 MIL/uL — ABNORMAL LOW (ref 4.22–5.81)
RBC: 3.23 MIL/uL — ABNORMAL LOW (ref 4.22–5.81)
RDW: 14.5 % (ref 11.5–15.5)
RDW: 14.5 % (ref 11.5–15.5)
WBC: 8.3 10*3/uL (ref 4.0–10.5)
WBC: 9.4 10*3/uL (ref 4.0–10.5)

## 2013-10-10 LAB — POCT I-STAT 7, (LYTES, BLD GAS, ICA,H+H)
Acid-base deficit: 2 mmol/L (ref 0.0–2.0)
Calcium, Ion: 1.13 mmol/L (ref 1.13–1.30)
Hemoglobin: 9.9 g/dL — ABNORMAL LOW (ref 13.0–17.0)
O2 Saturation: 99 %
Potassium: 3.5 mEq/L (ref 3.5–5.1)
Sodium: 135 mEq/L (ref 135–145)
TCO2: 25 mmol/L (ref 0–100)
pCO2 arterial: 40.1 mmHg (ref 35.0–45.0)
pH, Arterial: 7.377 (ref 7.350–7.450)

## 2013-10-10 LAB — TYPE AND SCREEN
ABO/RH(D): A POS
Antibody Screen: NEGATIVE
Unit division: 0
Unit division: 0
Unit division: 0
Unit division: 0
Unit division: 0
Unit division: 0

## 2013-10-10 LAB — PREPARE RBC (CROSSMATCH)

## 2013-10-10 LAB — APTT: aPTT: 38 seconds — ABNORMAL HIGH (ref 24–37)

## 2013-10-10 LAB — POCT I-STAT 3, ART BLOOD GAS (G3+)
Acid-Base Excess: 2 mmol/L (ref 0.0–2.0)
Acid-Base Excess: 4 mmol/L — ABNORMAL HIGH (ref 0.0–2.0)
Bicarbonate: 27.1 mEq/L — ABNORMAL HIGH (ref 20.0–24.0)
O2 Saturation: 97 %
O2 Saturation: 96 %
Patient temperature: 37.6
TCO2: 28 mmol/L (ref 0–100)
pO2, Arterial: 93 mmHg (ref 80.0–100.0)

## 2013-10-10 LAB — BASIC METABOLIC PANEL
BUN: 25 mg/dL — ABNORMAL HIGH (ref 6–23)
CO2: 26 mEq/L (ref 19–32)
Calcium: 7.6 mg/dL — ABNORMAL LOW (ref 8.4–10.5)
Chloride: 100 mEq/L (ref 96–112)
Creatinine, Ser: 0.97 mg/dL (ref 0.50–1.35)
GFR calc Af Amer: 90 mL/min — ABNORMAL LOW (ref >90)
GFR calc non Af Amer: 78 mL/min — ABNORMAL LOW (ref >90)
Glucose, Bld: 133 mg/dL — ABNORMAL HIGH (ref 70–99)
Potassium: 3.7 mEq/L (ref 3.5–5.1)
Sodium: 138 mEq/L (ref 135–145)

## 2013-10-10 LAB — PROTIME-INR
INR: 1.3 (ref 0.00–1.49)
Prothrombin Time: 15.9 seconds — ABNORMAL HIGH (ref 11.6–15.2)

## 2013-10-10 MED ORDER — HYDROCORTISONE SOD SUCCINATE 100 MG IJ SOLR
100.0000 mg | Freq: Four times a day (QID) | INTRAMUSCULAR | Status: AC
  Administered 2013-10-10 (×2): 100 mg via INTRAVENOUS
  Filled 2013-10-10 (×2): qty 2

## 2013-10-10 MED ORDER — POTASSIUM CHLORIDE 10 MEQ/50ML IV SOLN
INTRAVENOUS | Status: AC
  Administered 2013-10-10: 10 meq via INTRAVENOUS
  Filled 2013-10-10: qty 150

## 2013-10-10 MED ORDER — BUDESONIDE-FORMOTEROL FUMARATE 160-4.5 MCG/ACT IN AERO
2.0000 | INHALATION_SPRAY | Freq: Two times a day (BID) | RESPIRATORY_TRACT | Status: DC
  Filled 2013-10-10: qty 6

## 2013-10-10 MED ORDER — POTASSIUM CHLORIDE 10 MEQ/50ML IV SOLN
10.0000 meq | INTRAVENOUS | Status: AC
  Administered 2013-10-10 (×3): 10 meq via INTRAVENOUS

## 2013-10-10 MED ORDER — POTASSIUM CHLORIDE 10 MEQ/50ML IV SOLN
10.0000 meq | INTRAVENOUS | Status: AC | PRN
  Administered 2013-10-10 (×3): 10 meq via INTRAVENOUS

## 2013-10-10 NOTE — Progress Notes (Signed)
Subjective: Intubated.  Some response when questioned but was given Versed recently.  Objective: Vital signs in last 24 hours: Temp:  [99.9 F (37.7 C)-101.5 F (38.6 C)] 100.8 F (38.2 C) (11/06 1115) Pulse Rate:  [31-202] 99 (11/06 1115) Resp:  [12-36] 18 (11/06 1115) BP: (88-142)/(37-80) 125/65 mmHg (11/06 1100) SpO2:  [87 %-100 %] 96 % (11/06 1115) Arterial Line BP: (92-163)/(43-140) 137/60 mmHg (11/06 1115) FiO2 (%):  [50 %-90 %] 50 % (11/06 1115) Weight:  [284 lb 9.8 oz (129.1 kg)] 284 lb 9.8 oz (129.1 kg) (11/06 0500)    Intake/Output from previous day: 11/05 0701 - 11/06 0700 In: 4712.6 [I.V.:2790.1; Blood:362.5; NG/GT:210; IV Piggyback:1350] Out: 4225 [Urine:3375; Emesis/NG output:450; Chest Tube:400] Intake/Output this shift: Total I/O In: 692.3 [I.V.:414.8; Blood:137.5; NG/GT:90; IV Piggyback:50] Out: 655 [Urine:575; Chest Tube:80]  Medications Current Facility-Administered Medications  Medication Dose Route Frequency Provider Last Rate Last Dose  . 0.45 % sodium chloride infusion   Intravenous Continuous Wayne E Gold, PA-C 20 mL/hr at 10/10/13 0400    . 0.9 %  sodium chloride infusion   Intravenous Continuous Wayne E Gold, PA-C 10 mL/hr at 10/10/13 0400    . 0.9 %  sodium chloride infusion  250 mL Intravenous Continuous Wayne E Gold, PA-C      . acetaminophen (TYLENOL) tablet 1,000 mg  1,000 mg Oral Q6H Wayne E Gold, PA-C   1,000 mg at 10/10/13 0600   Or  . acetaminophen (TYLENOL) solution 1,000 mg  1,000 mg Per Tube Q6H Wayne E Gold, PA-C   1,000 mg at 10/09/13 1141  . ALPRAZolam Prudy Feeler) tablet 1 mg  1 mg Oral TID Kerin Perna, MD   1 mg at 10/10/13 0939  . aspirin EC tablet 325 mg  325 mg Oral Daily Rowe Clack, PA-C   325 mg at 10/09/13 1032   Or  . aspirin chewable tablet 324 mg  324 mg Per Tube Daily Wayne E Gold, PA-C      . atorvastatin (LIPITOR) tablet 80 mg  80 mg Oral q1800 Wayne E Gold, PA-C   80 mg at 10/09/13 1756  . bisacodyl (DULCOLAX) EC  tablet 10 mg  10 mg Oral Daily Wayne E Gold, PA-C       Or  . bisacodyl (DULCOLAX) suppository 10 mg  10 mg Rectal Daily Rowe Clack, PA-C   10 mg at 10/10/13 0939  . budesonide-formoterol (SYMBICORT) 160-4.5 MCG/ACT inhaler 2 puff  2 puff Inhalation BID Kerin Perna, MD      . cefTAZidime (FORTAZ) 1 g in dextrose 5 % 50 mL IVPB  1 g Intravenous Q8H Kerin Perna, MD   1 g at 10/10/13 [id number]  . dexmedetomidine (PRECEDEX) 200 MCG/50ML infusion  0.4-1.2 mcg/kg/hr Intravenous Continuous Kerin Perna, MD 26.7 mL/hr at 10/10/13 1100 0.9 mcg/kg/hr at 10/10/13 1100  . docusate sodium (COLACE) capsule 200 mg  200 mg Oral Daily Rowe Clack, PA-C   200 mg at 10/10/13 9604  . DOPamine (INTROPIN) 800 mg in dextrose 5 % 250 mL infusion  0-10 mcg/kg/min Intravenous Continuous Wayne E Gold, PA-C 11.1 mL/hr at 10/10/13 1120 5 mcg/kg/min at 10/10/13 1120  . enoxaparin (LOVENOX) injection 40 mg  40 mg Subcutaneous Q24H Kerin Perna, MD   40 mg at 10/09/13 1035  . furosemide (LASIX) 250 mg in dextrose 5 % 250 mL infusion  8 mg/hr Intravenous Continuous Kerin Perna, MD 8 mL/hr at 10/10/13 1100 8 mg/hr at  10/10/13 1100  . hydrocortisone sodium succinate (SOLU-CORTEF) 100 mg/2 mL injection 100 mg  100 mg Intravenous Q6H Kerin Perna, MD   100 mg at 10/10/13 0829  . insulin regular (NOVOLIN R,HUMULIN R) 1 Units/mL in sodium chloride 0.9 % 100 mL infusion   Intravenous Continuous Wayne E Gold, PA-C 4 mL/hr at 10/10/13 1100 4 Units/hr at 10/10/13 1100  . insulin regular bolus via infusion 0-10 Units  0-10 Units Intravenous TID WC Wayne E Gold, PA-C      . lactated ringers infusion   Intravenous Continuous Wayne E Gold, PA-C 20 mL/hr at 10/10/13 0400    . levalbuterol (XOPENEX) nebulizer solution 1.25 mg  1.25 mg Nebulization Q6H Kerin Perna, MD   1.25 mg at 10/10/13 0751  . metoprolol (LOPRESSOR) injection 2.5-5 mg  2.5-5 mg Intravenous Q2H PRN Wayne E Gold, PA-C      . metoprolol tartrate (LOPRESSOR)  tablet 12.5 mg  12.5 mg Oral BID Wayne E Gold, PA-C       Or  . metoprolol tartrate (LOPRESSOR) 25 mg/10 mL oral suspension 12.5 mg  12.5 mg Per Tube BID Wayne E Gold, PA-C      . midazolam (VERSED) injection 2 mg  2 mg Intravenous Q1H PRN Rowe Clack, PA-C   2 mg at 10/10/13 0825  . milrinone (PRIMACOR) infusion 200 mcg/mL (0.2 mg/ml)  0.2 mcg/kg/min Intravenous Continuous Kerin Perna, MD 7.1 mL/hr at 10/10/13 1100 0.2 mcg/kg/min at 10/10/13 1100  . morphine 2 MG/ML injection 2-5 mg  2-5 mg Intravenous Q1H PRN Rowe Clack, PA-C   4 mg at 10/10/13 0825  . norepinephrine (LEVOPHED) 16 mg in dextrose 5 % 250 mL infusion  2-50 mcg/min Intravenous Titrated Kerin Perna, MD 4.2 mL/hr at 10/10/13 1100 4.5 mcg/min at 10/10/13 1100  . ondansetron (ZOFRAN) injection 4 mg  4 mg Intravenous Q6H PRN Wayne E Gold, PA-C      . oxyCODONE (Oxy IR/ROXICODONE) immediate release tablet 5-10 mg  5-10 mg Oral Q3H PRN Wayne E Gold, PA-C      . pantoprazole (PROTONIX) injection 40 mg  40 mg Intravenous Q24H Kerin Perna, MD   40 mg at 10/09/13 2200  . phenylephrine (NEO-SYNEPHRINE) 20,000 mcg in dextrose 5 % 250 mL infusion  0-100 mcg/min Intravenous Continuous Rowe Clack, PA-C   5 mcg/min at 10/08/13 1641  . sodium chloride 0.9 % injection 3 mL  3 mL Intravenous Q12H Wayne E Gold, PA-C   3 mL at 10/10/13 0951  . sodium chloride 0.9 % injection 3 mL  3 mL Intravenous PRN Rowe Clack, PA-C        PE: General appearance: Intubated and mildly sedated. Lungs: clear to auscultation bilaterally Heart: regular rate and rhythm Abdomen: +BS Extremities: 2+ LEE Pulses: 2+ and symmetric Skin: Warm and dry  Lab Results:   Recent Labs  10/09/13 0400 10/09/13 1600 10/09/13 1644 10/10/13 0405  WBC 11.6* 9.7  --  8.3  HGB 8.4* 8.0* 13.6 8.4*  HCT 23.6* 22.1* 40.0 24.0*  PLT 251 174  --  163   BMET  Recent Labs  10/09/13 0400 10/09/13 1600 10/09/13 1644 10/10/13 0405  NA 136 135 137 137  K 3.8  3.7 3.7 3.4*  CL 103 99 98 100  CO2 24 28  --  27  GLUCOSE 98 133* 129* 115*  BUN 27* 26* 24* 25*  CREATININE 0.99 1.10 1.30 1.10  CALCIUM 7.8* 8.1*  --  7.7*   PT/INR  Recent Labs  10/08/13 1500 10/10/13 0405  LABPROT 16.7* 15.9*  INR 1.39 1.30   Assessment/Plan  Principal Problem:   STEMI of anterior wall,  10/05/13 Active Problems:   Essential hypertension   Obesity (BMI 35.0-39.9 without comorbidity)   S/P urgent LAD PTCA 10/06/13 with IABP for LAD perfusion   Anxiety, claustrophobia   DJD  lumbar- surg August 2013   Sleep apnea- C-pap intol   AAA (abdominal aortic aneurysm)-hx of a "small" AAA found July 2013 (nothing in EPIC)  Plan:  POD# 2.  CABGx 2 LIMA to LAD, SVG to diag.  Intubated.  Mildly febrile.  Balloon pump DCd.  Meds:  Levophed, Dopamine, milrinone,  Lasix.   LOS: 5 days    HAGER, BRYAN 10/10/2013 11:30 AM  I have seen and evaluated the patient this PM along with Wilburt Finlay, PA. I agree with his findings, examination as well as impression recommendations.  Weaned off IABP & pulled today. Weaning off Levophed now & still weaning Nitric Oxide (diastolic dysfunction is an issue - recent Anterior STEMI) Remains intubated (difficult to wean due to anxiety). 1 Unit PRBC given today for anemia.  Low grade Fever - on Abx.  Marykay Lex, M.D., M.S. [facility] [address] [place], [place]  [address]  [phone] Pager # [phone] 10/10/2013 7:55 PM

## 2013-10-10 NOTE — Progress Notes (Signed)
TCTS BRIEF SICU PROGRESS NOTE  2 Days Post-Op  S/P Procedure(s) (LRB): CORONARY ARTERY BYPASS GRAFTING (CABG) (N/A) INTRAOPERATIVE TRANSESOPHAGEAL ECHOCARDIOGRAM (N/A)   Sedated on vent NSR w/ stable BP, tolerating wean of nitric oxide and levophed O2 sats 96% Diuresing well  Plan: Continue current plan  Purcell Nails 10/10/2013 7:41 PM

## 2013-10-10 NOTE — Progress Notes (Signed)
2 Days Post-Op Procedure(s) (LRB): CORONARY ARTERY BYPASS GRAFTING (CABG) (N/A) INTRAOPERATIVE TRANSESOPHAGEAL ECHOCARDIOGRAM (N/A) Subjective: Acute hemorrhagic MI-- CPK-MB > 350- total L:AD- preop IABP in cath lab CABG x2 Postop  Stiff LV with adequate EF- preload dependent Postop fever, anemia Hx of xanax dependent anxiety Objective: Vital signs in last 24 hours: Temp:  [99.9 F (37.7 C)-102 F (38.9 C)] 100.2 F (37.9 C) (11/06 0700) Pulse Rate:  [44-172] 95 (11/06 0700) Cardiac Rhythm:  [-] Normal sinus rhythm (11/06 0600) Resp:  [12-36] 15 (11/06 0700) BP: (88-142)/(37-80) 118/63 mmHg (11/06 0700) SpO2:  [87 %-100 %] 100 % (11/06 0700) Arterial Line BP: (69-163)/(43-140) 132/53 mmHg (11/06 0700) FiO2 (%):  [50 %-90 %] 50 % (11/06 0600) Weight:  [284 lb 9.8 oz (129.1 kg)] 284 lb 9.8 oz (129.1 kg) (11/06 0500)  Hemodynamic parameters for last 24 hours: PAP: (25-53)/(7-25) 28/16 mmHg CO:  [4.9 L/min-6.1 L/min] 5.5 L/min CI:  [2.1 L/min/m2-2.5 L/min/m2] 2.3 L/min/m2  Intake/Output from previous day: 11/05 0701 - 11/06 0700 In: 4712.6 [I.V.:2790.1; Blood:362.5; NG/GT:210; IV Piggyback:1350] Out: 4225 [Urine:3375; Emesis/NG output:450; Chest Tube:400] Intake/Output this shift:    EXAM cmfotable on vent NSR IABP 1:3 with good hemodynamics  Lab Results:  Recent Labs  10/09/13 1600 10/09/13 1644 10/10/13 0405  WBC 9.7  --  8.3  HGB 8.0* 13.6 8.4*  HCT 22.1* 40.0 24.0*  PLT 174  --  163   BMET:  Recent Labs  10/09/13 1600 10/09/13 1644 10/10/13 0405  NA 135 137 137  K 3.7 3.7 3.4*  CL 99 98 100  CO2 28  --  27  GLUCOSE 133* 129* 115*  BUN 26* 24* 25*  CREATININE 1.10 1.30 1.10  CALCIUM 8.1*  --  7.7*    PT/INR:  Recent Labs  10/10/13 0405  LABPROT 15.9*  INR 1.30   ABG    Component Value Date/Time   PHART 7.408 10/10/2013 0409   HCO3 27.1* 10/10/2013 0409   TCO2 28 10/10/2013 0409   ACIDBASEDEF 2.0 10/08/2013 1524   O2SAT 49.8 10/10/2013 0410    CBG (last 3)   Recent Labs  10/10/13 0204 10/10/13 0406 10/10/13 0604  GLUCAP 111* 108* 88    Assessment/Plan: S/P Procedure(s) (LRB): CORONARY ARTERY BYPASS GRAFTING (CABG) (N/A) INTRAOPERATIVE TRANSESOPHAGEAL ECHOCARDIOGRAM (N/A)  Plan DC balloon  pump Wean NO then vent 1 unit PRBC for postop anemia, expected   LOS: 5 days    VAN TRIGT III,PETER 10/10/2013

## 2013-10-10 NOTE — Progress Notes (Signed)
Order for sheath removal verified per post procedural orders. Procedure explained to patient and Rt femoral artery access site assessed: level 0, palpable dorsalis pedis and posterior tibial pulses. 8French Sheath removed and manual pressure applied for 25 minutes. Pre, peri, & post procedural vitals: HR 65, RR 12, O2 Sat upper 144, BP 88, Pain 0. Distal pulses remained intact after sheath removal. Access site level 0 and dressed with 4X4 gauze and tegaderm. RN confirmed condition of site. Post procedural instructions discussed with return demonstration from patient.

## 2013-10-11 ENCOUNTER — Inpatient Hospital Stay (HOSPITAL_COMMUNITY): Payer: Medicare Other

## 2013-10-11 DIAGNOSIS — E669 Obesity, unspecified: Secondary | ICD-10-CM

## 2013-10-11 DIAGNOSIS — G4730 Sleep apnea, unspecified: Secondary | ICD-10-CM

## 2013-10-11 LAB — CULTURE, RESPIRATORY W GRAM STAIN: Special Requests: NORMAL

## 2013-10-11 LAB — GLUCOSE, CAPILLARY
Glucose-Capillary: 103 mg/dL — ABNORMAL HIGH (ref 70–99)
Glucose-Capillary: 104 mg/dL — ABNORMAL HIGH (ref 70–99)
Glucose-Capillary: 106 mg/dL — ABNORMAL HIGH (ref 70–99)
Glucose-Capillary: 107 mg/dL — ABNORMAL HIGH (ref 70–99)
Glucose-Capillary: 107 mg/dL — ABNORMAL HIGH (ref 70–99)
Glucose-Capillary: 110 mg/dL — ABNORMAL HIGH (ref 70–99)
Glucose-Capillary: 112 mg/dL — ABNORMAL HIGH (ref 70–99)
Glucose-Capillary: 116 mg/dL — ABNORMAL HIGH (ref 70–99)
Glucose-Capillary: 50 mg/dL — ABNORMAL LOW (ref 70–99)
Glucose-Capillary: 82 mg/dL (ref 70–99)
Glucose-Capillary: 86 mg/dL (ref 70–99)
Glucose-Capillary: 89 mg/dL (ref 70–99)
Glucose-Capillary: 90 mg/dL (ref 70–99)
Glucose-Capillary: 91 mg/dL (ref 70–99)
Glucose-Capillary: 97 mg/dL (ref 70–99)

## 2013-10-11 LAB — TYPE AND SCREEN
ABO/RH(D): A POS
Antibody Screen: NEGATIVE
Unit division: 0

## 2013-10-11 LAB — COMPREHENSIVE METABOLIC PANEL
ALT: 52 U/L (ref 0–53)
AST: 44 U/L — ABNORMAL HIGH (ref 0–37)
Albumin: 2.3 g/dL — ABNORMAL LOW (ref 3.5–5.2)
Alkaline Phosphatase: 45 U/L (ref 39–117)
BUN: 26 mg/dL — ABNORMAL HIGH (ref 6–23)
CO2: 28 mEq/L (ref 19–32)
Calcium: 7.3 mg/dL — ABNORMAL LOW (ref 8.4–10.5)
Chloride: 99 mEq/L (ref 96–112)
Creatinine, Ser: 0.97 mg/dL (ref 0.50–1.35)
GFR calc Af Amer: 90 mL/min — ABNORMAL LOW (ref >90)
GFR calc non Af Amer: 78 mL/min — ABNORMAL LOW (ref >90)
Glucose, Bld: 105 mg/dL — ABNORMAL HIGH (ref 70–99)
Potassium: 3.2 mEq/L — ABNORMAL LOW (ref 3.5–5.1)
Sodium: 136 mEq/L (ref 135–145)
Total Bilirubin: 2.5 mg/dL — ABNORMAL HIGH (ref 0.3–1.2)
Total Protein: 5.3 g/dL — ABNORMAL LOW (ref 6.0–8.3)

## 2013-10-11 LAB — CBC
HCT: 26 % — ABNORMAL LOW (ref 39.0–52.0)
Hemoglobin: 9.2 g/dL — ABNORMAL LOW (ref 13.0–17.0)
MCH: 31.1 pg (ref 26.0–34.0)
MCHC: 35.4 g/dL (ref 30.0–36.0)
MCV: 87.8 fL (ref 78.0–100.0)
Platelets: 176 10*3/uL (ref 150–400)
RBC: 2.96 MIL/uL — ABNORMAL LOW (ref 4.22–5.81)
RDW: 14.4 % (ref 11.5–15.5)
WBC: 7.8 10*3/uL (ref 4.0–10.5)

## 2013-10-11 LAB — CULTURE, RESPIRATORY

## 2013-10-11 LAB — POCT I-STAT 3, ART BLOOD GAS (G3+)
Acid-Base Excess: 6 mmol/L — ABNORMAL HIGH (ref 0.0–2.0)
O2 Saturation: 90 %
Patient temperature: 99.7
TCO2: 30 mmol/L (ref 0–100)
pH, Arterial: 7.504 — ABNORMAL HIGH (ref 7.350–7.450)
pO2, Arterial: 54 mmHg — ABNORMAL LOW (ref 80.0–100.0)

## 2013-10-11 LAB — CARBOXYHEMOGLOBIN
Carboxyhemoglobin: 1.2 % (ref 0.5–1.5)
Methemoglobin: 1.5 % (ref 0.0–1.5)
O2 Saturation: 44.8 %
Total hemoglobin: 11.4 g/dL — ABNORMAL LOW (ref 13.5–18.0)

## 2013-10-11 MED ORDER — DEXMEDETOMIDINE HCL IN NACL 400 MCG/100ML IV SOLN
0.4000 ug/kg/h | INTRAVENOUS | Status: DC
  Administered 2013-10-11: 1 ug/kg/h via INTRAVENOUS
  Administered 2013-10-11: 1.2 ug/kg/h via INTRAVENOUS
  Administered 2013-10-12 (×2): 0.5 ug/kg/h via INTRAVENOUS
  Administered 2013-10-12 (×3): 0.9 ug/kg/h via INTRAVENOUS
  Administered 2013-10-13 (×2): 0.5 ug/kg/h via INTRAVENOUS
  Filled 2013-10-11 (×8): qty 100

## 2013-10-11 MED ORDER — CHLORHEXIDINE GLUCONATE 0.12 % MT SOLN
15.0000 mL | Freq: Two times a day (BID) | OROMUCOSAL | Status: DC
  Administered 2013-10-11 – 2013-10-13 (×4): 15 mL via OROMUCOSAL
  Filled 2013-10-11 (×4): qty 15

## 2013-10-11 MED ORDER — BUDESONIDE 0.25 MG/2ML IN SUSP
0.2500 mg | Freq: Two times a day (BID) | RESPIRATORY_TRACT | Status: DC
  Administered 2013-10-11 – 2013-10-25 (×28): 0.25 mg via RESPIRATORY_TRACT
  Filled 2013-10-11 (×33): qty 2

## 2013-10-11 MED ORDER — BIOTENE DRY MOUTH MT LIQD
15.0000 mL | Freq: Four times a day (QID) | OROMUCOSAL | Status: DC
  Administered 2013-10-12 – 2013-10-13 (×6): 15 mL via OROMUCOSAL

## 2013-10-11 MED ORDER — POTASSIUM CHLORIDE 10 MEQ/50ML IV SOLN
10.0000 meq | INTRAVENOUS | Status: AC | PRN
  Administered 2013-10-11 (×3): 10 meq via INTRAVENOUS

## 2013-10-11 NOTE — Progress Notes (Signed)
Inpatient Diabetes Program Recommendations  AACE/ADA: New Consensus Statement on Inpatient Glycemic Control (2013)  Target Ranges:  Prepandial:   less than 140 mg/dL      Peak postprandial:   less than 180 mg/dL (1-2 hours)      Critically ill patients:  140 - 180 mg/dL   Reason for Visit: CBG's less than 110 mg/dL on insulin drip.  No history of diabetes and A1C=5.5%.  Patient will likely not need basal insulin once off insulin gtt.     Thanks, Beryl Meager, RN, BC-ADM Inpatient Diabetes Coordinator Pager [phone]

## 2013-10-11 NOTE — Progress Notes (Signed)
Subjective: Cannot voice any complaints. Remains intubated. Does not appear to be in any distress. He is alert and responds to commands (opens eyes and squeezes hand).   Objective: Vital signs in last 24 hours: Temp:  [99.3 F (37.4 C)-100.8 F (38.2 C)] 100.2 F (37.9 C) (11/07 1015) Pulse Rate:  [86-104] 91 (11/07 1015) Resp:  [11-23] 15 (11/07 1015) BP: (105-139)/(56-74) 108/63 mmHg (11/07 1000) SpO2:  [92 %-100 %] 95 % (11/07 1015) Arterial Line BP: (108-166)/(52-91) 131/60 mmHg (11/07 1015) FiO2 (%):  [50 %-65 %] 60 % (11/07 1015) Weight:  [282 lb 13.6 oz (128.3 kg)] 282 lb 13.6 oz (128.3 kg) (11/07 0500)    Intake/Output from previous day: 11/06 0701 - 11/07 0700 In: 3744.4 [I.V.:2608.1; Blood:356.3; NG/GT:330; IV Piggyback:450] Out: 3610 [Urine:3150; Emesis/NG output:50; Chest Tube:410] Intake/Output this shift: Total I/O In: 367.2 [I.V.:317.2; IV Piggyback:50] Out: 425 [Urine:425]  Medications Current Facility-Administered Medications  Medication Dose Route Frequency Provider Last Rate Last Dose  . 0.45 % sodium chloride infusion   Intravenous Continuous Wayne E Gold, PA-C 20 mL/hr at 10/11/13 0900    . 0.9 %  sodium chloride infusion   Intravenous Continuous Wayne E Gold, PA-C 10 mL/hr at 10/11/13 0400    . 0.9 %  sodium chloride infusion  250 mL Intravenous Continuous Wayne E Gold, PA-C      . acetaminophen (TYLENOL) tablet 1,000 mg  1,000 mg Oral Q6H Wayne E Gold, PA-C   1,000 mg at 10/11/13 0600   Or  . acetaminophen (TYLENOL) solution 1,000 mg  1,000 mg Per Tube Q6H Wayne E Gold, PA-C   1,000 mg at 10/10/13 1750  . ALPRAZolam Prudy Feeler) tablet 1 mg  1 mg Oral TID Kerin Perna, MD   1 mg at 10/11/13 0909  . aspirin EC tablet 325 mg  325 mg Oral Daily Rowe Clack, PA-C   325 mg at 10/09/13 1032   Or  . aspirin chewable tablet 324 mg  324 mg Per Tube Daily Rowe Clack, PA-C   324 mg at 10/11/13 0909  . atorvastatin (LIPITOR) tablet 80 mg  80 mg Oral q1800  Wayne E Gold, PA-C   80 mg at 10/10/13 1750  . bisacodyl (DULCOLAX) EC tablet 10 mg  10 mg Oral Daily Wayne E Gold, PA-C       Or  . bisacodyl (DULCOLAX) suppository 10 mg  10 mg Rectal Daily Rowe Clack, PA-C   10 mg at 10/11/13 0909  . budesonide (PULMICORT) nebulizer solution 0.25 mg  0.25 mg Nebulization BID Kerin Perna, MD      . cefTAZidime (FORTAZ) 1 g in dextrose 5 % 50 mL IVPB  1 g Intravenous Q8H Kerin Perna, MD   1 g at 10/11/13 0908  . dexmedetomidine (PRECEDEX) 200 MCG/50ML infusion  0.4-1.2 mcg/kg/hr Intravenous Continuous Kerin Perna, MD 26.7 mL/hr at 10/11/13 1058 0.9 mcg/kg/hr at 10/11/13 1058  . docusate sodium (COLACE) capsule 200 mg  200 mg Oral Daily Rowe Clack, PA-C   200 mg at 10/11/13 0909  . DOPamine (INTROPIN) 800 mg in dextrose 5 % 250 mL infusion  0-10 mcg/kg/min Intravenous Continuous Wayne E Gold, PA-C 11.1 mL/hr at 10/11/13 1000 5 mcg/kg/min at 10/11/13 1000  . enoxaparin (LOVENOX) injection 40 mg  40 mg Subcutaneous Q24H Kerin Perna, MD   40 mg at 10/10/13 1132  . furosemide (LASIX) 250 mg in dextrose 5 % 250 mL infusion  8 mg/hr  Intravenous Continuous Kerin Perna, MD 8 mL/hr at 10/11/13 1000 8 mg/hr at 10/11/13 1000  . insulin regular (NOVOLIN R,HUMULIN R) 1 Units/mL in sodium chloride 0.9 % 100 mL infusion   Intravenous Continuous Wayne E Gold, PA-C 2.8 mL/hr at 10/11/13 1000 2.8 Units/hr at 10/11/13 1000  . insulin regular bolus via infusion 0-10 Units  0-10 Units Intravenous TID WC Wayne E Gold, PA-C      . lactated ringers infusion   Intravenous Continuous Wayne E Gold, PA-C 20 mL/hr at 10/11/13 0400    . levalbuterol (XOPENEX) nebulizer solution 1.25 mg  1.25 mg Nebulization Q6H Kerin Perna, MD   1.25 mg at 10/11/13 0834  . metoprolol (LOPRESSOR) injection 2.5-5 mg  2.5-5 mg Intravenous Q2H PRN Wayne E Gold, PA-C      . metoprolol tartrate (LOPRESSOR) tablet 12.5 mg  12.5 mg Oral BID Wayne E Gold, PA-C       Or  . metoprolol tartrate  (LOPRESSOR) 25 mg/10 mL oral suspension 12.5 mg  12.5 mg Per Tube BID Wayne E Gold, PA-C      . midazolam (VERSED) injection 2 mg  2 mg Intravenous Q1H PRN Rowe Clack, PA-C   2 mg at 10/11/13 0214  . milrinone (PRIMACOR) infusion 200 mcg/mL (0.2 mg/ml)  0.2 mcg/kg/min Intravenous Continuous Kerin Perna, MD 7.1 mL/hr at 10/11/13 1000 0.2 mcg/kg/min at 10/11/13 1000  . morphine 2 MG/ML injection 2-5 mg  2-5 mg Intravenous Q1H PRN Rowe Clack, PA-C   4 mg at 10/11/13 0214  . norepinephrine (LEVOPHED) 16 mg in dextrose 5 % 250 mL infusion  2-50 mcg/min Intravenous Titrated Kerin Perna, MD 0.9 mL/hr at 10/11/13 1000 1 mcg/min at 10/11/13 1000  . ondansetron (ZOFRAN) injection 4 mg  4 mg Intravenous Q6H PRN Wayne E Gold, PA-C      . oxyCODONE (Oxy IR/ROXICODONE) immediate release tablet 5-10 mg  5-10 mg Oral Q3H PRN Wayne E Gold, PA-C   10 mg at 10/11/13 0600  . pantoprazole (PROTONIX) injection 40 mg  40 mg Intravenous Q24H Kerin Perna, MD   40 mg at 10/10/13 2200  . phenylephrine (NEO-SYNEPHRINE) 20,000 mcg in dextrose 5 % 250 mL infusion  0-100 mcg/min Intravenous Continuous Rowe Clack, PA-C   5 mcg/min at 10/08/13 1641  . sodium chloride 0.9 % injection 3 mL  3 mL Intravenous Q12H Wayne E Gold, PA-C   3 mL at 10/10/13 2200  . sodium chloride 0.9 % injection 3 mL  3 mL Intravenous PRN Rowe Clack, PA-C        PE: General appearance: alert, cooperative and no distress Lungs: clear to auscultation bilaterally Heart: regular rate and rhythm Extremities: no LEE Pulses: 2+ and symmetric Skin: cool distal extremities Neurologic: Grossly normal  Lab Results:   Recent Labs  10/10/13 0405 10/10/13 1500 10/11/13 0410  WBC 8.3 9.4 7.8  HGB 8.4* 10.1* 9.2*  HCT 24.0* 28.2* 26.0*  PLT 163 169 176   BMET  Recent Labs  10/10/13 0405 10/10/13 1500 10/11/13 0410  NA 137 138 136  K 3.4* 3.7 3.2*  CL 100 100 99  CO2 27 26 28   GLUCOSE 115* 133* 105*  BUN 25* 25* 26*    CREATININE 1.10 0.97 0.97  CALCIUM 7.7* 7.6* 7.3*   PT/INR  Recent Labs  10/08/13 1500 10/10/13 0405  LABPROT 16.7* 15.9*  INR 1.39 1.30     Assessment/Plan  Principal Problem:   STEMI  of anterior wall,  10/05/13 Active Problems:   Essential hypertension   Obesity (BMI 35.0-39.9 without comorbidity)   S/P urgent LAD PTCA 10/06/13 with IABP for LAD perfusion   Anxiety, claustrophobia   DJD  lumbar- surg August 2013   Sleep apnea- C-pap intol   AAA (abdominal aortic aneurysm)-hx of a "small" AAA found July 2013 (nothing in EPIC)   S/P CABG x 2: LIMA-LAD, SVG-DIAG  Plan: Day 3 s/p Coronary artery bypass grafting x2 (left internal mammary artery LAD, saphenous vein graft to diagonal). Vitals stable on Dopamine, Levophed and Milrinone. NSR on telemetry. No post-operative arrhthymias. He was hypokalemic at 3.2. He was given IV K earlier this am. Continue currently plan of care. MD to follow.      LOS: 6 days    Brittainy M. Sharol Harness, PA-C 10/11/2013 11:05 AM  I have seen and examined the patient along with Brittainy M. Sharol Harness, PA-C.  I have reviewed the chart, notes and new data.  I agree with PA/NP's note.  He is making very slow progress, but progress nonetheless. Biggest problem appears to be respiratory failure - requires FiO2 0.6 and he has thick secretions. His PA pressure is only mildly elevated and his CXR does not look like CHF. He does have significant tissue edema and is definitely hypervolemic.  Slowly weaning off pressors, about to stop 1 of three agents. UO is good.  PLAN: Continue supportive care. Postop recovery will be very slow. Anticipate need for NH rehab.  Thurmon Fair, MD, [facility] [phone] 10/11/2013, 2:18 PM

## 2013-10-11 NOTE — Progress Notes (Signed)
NUTRITION FOLLOW UP  Intervention:    Recommend initiation of Vital HP formula -- -- initiate at 20 ml/hr and increase by 10 ml every 4 hours to goal rate of 40 ml/hr with Prostat liquid protein 60 ml TID via tube to provide 1560 kcals (65% of estimated kcal needs), 174 gm protein (100% of estimated protein needs), 778 ml of free water RD to follow for nutrition care plan  Nutrition Dx:   Inadequate oral intake related to inability to eat as evidenced by NPO status, ongoing  Goal:   Initiate EN support within next 24-48 hours if prolonged intubation expected, unmet  Monitor:   EN initiation & tolerance, respiratory status, weight, labs, I/O's  Assessment:   Patient with PMH of HTN, OSA, obesity, AAA with anxiety; patient developed substernal chest pressure and his daughter called EMS; brought emergently to cardiac cath lab where he was evaluated -- CVTS consulted.   Patient s/p procedures 11/4:  CORONARY ARTERY BYPASS GRAFTING (CABG)  INTRAOPERATIVE TRANSESOPHAGEAL ECHOCARDIOGRAM  [facility] RIGHT THIGH   Patient is currently intubated on ventilator support -- OGT in place  MV: 12.2 Temp: 37.9  Patient has been intubated without nutrition support x 3 days.  RD spoke with Coral Ceo, PA-C regarding ? starting tube feeding.  Height: Ht Readings from Last 1 Encounters:  10/07/13 6\' 1"  (1.854 m)    Weight Status:   Wt Readings from Last 1 Encounters:  10/11/13 282 lb 13.6 oz (128.3 kg)    Re-estimated needs:  Kcal: 2380 Protein: 165-175 gm Fluid: per MD  Skin: chest & leg surgical incisions   Diet Order: NPO   Intake/Output Summary (Last 24 hours) at 10/11/13 1115 Last data filed at 10/11/13 1100  Gross per 24 hour  Intake 3506.13 ml  Output   3505 ml  Net   1.13 ml    Labs:   Recent Labs Lab 10/08/13 2100  10/09/13 0400 10/09/13 1600  10/10/13 0405 10/10/13 1500 10/11/13 0410  NA  --   < > 136 135  < > 137 138 136  K  --   < > 3.8 3.7  < > 3.4* 3.7 3.2*   CL  --   < > 103 99  < > 100 100 99  CO2  --   --  24 28  --  27 26 28   BUN  --   < > 27* 26*  < > 25* 25* 26*  CREATININE 0.96  < > 0.99 1.10  < > 1.10 0.97 0.97  CALCIUM  --   --  7.8* 8.1*  --  7.7* 7.6* 7.3*  MG 3.0*  --  2.2 2.1  --   --   --   --   GLUCOSE  --   < > 98 133*  < > 115* 133* 105*  < > = values in this interval not displayed.  CBG (last 3)   Recent Labs  10/11/13 0208 10/11/13 0411 10/11/13 0605  GLUCAP 103* 103* 82    Scheduled Meds: . acetaminophen  1,000 mg Oral Q6H   Or  . acetaminophen (TYLENOL) oral liquid 160 mg/5 mL  1,000 mg Per Tube Q6H  . ALPRAZolam  1 mg Oral TID  . aspirin EC  325 mg Oral Daily   Or  . aspirin  324 mg Per Tube Daily  . atorvastatin  80 mg Oral q1800  . bisacodyl  10 mg Oral Daily   Or  . bisacodyl  10 mg  Rectal Daily  . budesonide (PULMICORT) nebulizer solution  0.25 mg Nebulization BID  . cefTAZidime (FORTAZ)  IV  1 g Intravenous Q8H  . docusate sodium  200 mg Oral Daily  . enoxaparin (LOVENOX) injection  40 mg Subcutaneous Q24H  . insulin regular  0-10 Units Intravenous TID WC  . levalbuterol  1.25 mg Nebulization Q6H  . metoprolol tartrate  12.5 mg Oral BID   Or  . metoprolol tartrate  12.5 mg Per Tube BID  . pantoprazole (PROTONIX) IV  40 mg Intravenous Q24H  . sodium chloride  3 mL Intravenous Q12H    Continuous Infusions: . sodium chloride 20 mL/hr at 10/11/13 0900  . sodium chloride 10 mL/hr at 10/11/13 0400  . sodium chloride    . dexmedetomidine 0.9 mcg/kg/hr (10/11/13 1100)  . DOPamine 5 mcg/kg/min (10/11/13 1100)  . furosemide (LASIX) infusion 8 mg/hr (10/11/13 1100)  . insulin (NOVOLIN-R) infusion 2.6 Units/hr (10/11/13 1100)  . lactated ringers 20 mL/hr at 10/11/13 0400  . milrinone 0.2 mcg/kg/min (10/11/13 1100)  . norepinephrine (LEVOPHED) Adult infusion 1 mcg/min (10/11/13 1100)  . phenylephrine (NEO-SYNEPHRINE) Adult infusion Stopped (10/08/13 1700)    Maureen Chatters, RD, LDN Pager #:  [phone] After-Hours Pager #: [phone]

## 2013-10-11 NOTE — Progress Notes (Signed)
PM ROUNDS  Alert on ventilator at present  BP 98/56  Pulse 84  Temp(Src) 99.9 F (37.7 C) (Core (Comment))  Resp 17  Ht 6\' 1"  (1.854 m)  Wt 282 lb 13.6 oz (128.3 kg)  BMI 37.33 kg/m2  SpO2 99% 32/17 CO 5.06  Intake/Output Summary (Last 24 hours) at 10/11/13 1859 Last data filed at 10/11/13 1800  Gross per 24 hour  Intake 2931.63 ml  Output   3650 ml  Net -718.37 ml   Still on a few drops of levophed, will decrease milrinone to 0.1 Continue dopamine at 3

## 2013-10-11 NOTE — Progress Notes (Addendum)
[facility] DAILY ICU PROGRESS NOTE                   [address]            [place] [id number]          [phone]   3 Days Post-Op Procedure(s) (LRB): CORONARY ARTERY BYPASS GRAFTING (CABG) (N/A) INTRAOPERATIVE TRANSESOPHAGEAL ECHOCARDIOGRAM (N/A)  Total Length of Stay:  LOS: 6 days   Subjective: On vent, opens eyes and follows commands.    Objective: Vital signs in last 24 hours: Temp:  [99.3 F (37.4 C)-100.8 F (38.2 C)] 100 F (37.8 C) (11/07 0700) Pulse Rate:  [87-202] 87 (11/07 0700) Cardiac Rhythm:  [-] Normal sinus rhythm (11/07 0600) Resp:  [11-26] 16 (11/07 0700) BP: (105-139)/(56-74) 108/61 mmHg (11/07 0700) SpO2:  [92 %-100 %] 93 % (11/07 0700) Arterial Line BP: (108-166)/(49-91) 132/59 mmHg (11/07 0700) FiO2 (%):  [50 %-65 %] 60 % (11/07 0600) Weight:  [282 lb 13.6 oz (128.3 kg)] 282 lb 13.6 oz (128.3 kg) (11/07 0500)  Filed Weights   10/09/13 0630 10/10/13 0500 10/11/13 0500  Weight: 285 lb 7.9 oz (129.5 kg) 284 lb 9.8 oz (129.1 kg) 282 lb 13.6 oz (128.3 kg)  PRE-OPERATIVE WEIGHT: 118kg   Weight change: -1 lb 12.2 oz (-0.8 kg)   Hemodynamic parameters for last 24 hours: PAP: (23-47)/(16-34) 33/24 mmHg CO:  [4.8 L/min-5.2 L/min] 4.8 L/min CI:  [2 L/min/m2-2.2 L/min/m2] 2 L/min/m2  Intake/Output from previous day: 11/06 0701 - 11/07 0700 In: 3744.4 [I.V.:2608.1; Blood:356.3; NG/GT:330; IV Piggyback:450] Out: 3610 [Urine:3150; Emesis/NG output:50; Chest Tube:410]    CBGs 122-139-135-148-126-111-106-103-103-82  Current Meds: Scheduled Meds: . acetaminophen  1,000 mg Oral Q6H   Or  . acetaminophen (TYLENOL) oral liquid 160 mg/5 mL  1,000 mg Per Tube Q6H  . ALPRAZolam  1 mg Oral TID  . aspirin EC  325 mg Oral Daily   Or  . aspirin  324 mg Per Tube Daily  . atorvastatin  80 mg Oral q1800  . bisacodyl  10 mg Oral Daily   Or  . bisacodyl  10 mg Rectal Daily  . budesonide-formoterol  2 puff Inhalation BID  . cefTAZidime (FORTAZ)   IV  1 g Intravenous Q8H  . docusate sodium  200 mg Oral Daily  . enoxaparin (LOVENOX) injection  40 mg Subcutaneous Q24H  . insulin regular  0-10 Units Intravenous TID WC  . levalbuterol  1.25 mg Nebulization Q6H  . metoprolol tartrate  12.5 mg Oral BID   Or  . metoprolol tartrate  12.5 mg Per Tube BID  . pantoprazole (PROTONIX) IV  40 mg Intravenous Q24H  . sodium chloride  3 mL Intravenous Q12H   Continuous Infusions: . sodium chloride 20 mL/hr at 10/11/13 0400  . sodium chloride 10 mL/hr at 10/11/13 0400  . sodium chloride    . dexmedetomidine 0.9 mcg/kg/hr (10/11/13 0700)  . DOPamine 5 mcg/kg/min (10/11/13 0400)  . furosemide (LASIX) infusion 8 mg/hr (10/11/13 0400)  . insulin (NOVOLIN-R) infusion 1.5 Units/hr (10/11/13 0700)  . lactated ringers 20 mL/hr at 10/11/13 0400  . milrinone 0.2 mcg/kg/min (10/11/13 0400)  . norepinephrine (LEVOPHED) Adult infusion 1 mcg/min (10/11/13 0400)  . phenylephrine (NEO-SYNEPHRINE) Adult infusion Stopped (10/08/13 1700)   PRN Meds:.metoprolol, midazolam, morphine injection, ondansetron (ZOFRAN) IV, oxyCODONE, potassium chloride, sodium chloride  Physical Exam: General appearance: On vent, opens eyes, follows commands. Heart: regular rate and rhythm Lungs: Coarse rhonchi bilaterally Extremities: Mild LE edema  Wound: Dressed and dry  Lab Results: CBC: Recent Labs  10/10/13 1500 10/11/13 0410  WBC 9.4 7.8  HGB 10.1* 9.2*  HCT 28.2* 26.0*  PLT 169 176   BMET:  Recent Labs  10/10/13 1500 10/11/13 0410  NA 138 136  K 3.7 3.2*  CL 100 99  CO2 26 28  GLUCOSE 133* 105*  BUN 25* 26*  CREATININE 0.97 0.97  CALCIUM 7.6* 7.3*    PT/INR:  Recent Labs  10/10/13 0405  LABPROT 15.9*  INR 1.30   Radiology: Dg Chest Port 1 View  10/11/2013   CLINICAL DATA:  Ventilator dependent respiratory failure. Subsequent encounter for bibasilar atelectasis.  EXAM: PORTABLE CHEST - 1 VIEW  COMPARISON:  Portable examinations yesterday dating  back to 10/06/2013.  FINDINGS: Endotracheal tube tip in satisfactory position projecting approximately 4cm above the carina. Leftjugularcentral venous catheter tip projects over the lower SVC. Nasogastric tube courses below the diaphragm into the stomach. Left chest tube in place with no pneumothorax. Slight improved aeration left lung base since yesterday, though moderate atelectasis persists. No change in the mild to moderate right basilar atelectasis. No new pulmonary parenchymal abnormalities.  IMPRESSION: 1. Support apparatus satisfactory. 2. Left chest tube in place with no pneumothorax. 3. Improved aeration in the left lung base since yesterday with mild moderate atelectasis persisting. 4. Stable mild to moderate right basilar atelectasis. 5. No new abnormalities.   Electronically Signed   By: Hulan Saas M.D.   On: 10/11/2013 07:40   Dg Chest Port 1 View  10/10/2013   CLINICAL DATA:  Coronary artery disease. Status post CABG.  EXAM: PORTABLE CHEST - 1 VIEW  COMPARISON:  10/09/2013 and 10/08/2013  FINDINGS: Endotracheal tube, Swan-Ganz catheter, OG tube, and chest tubes appear unchanged. No pneumothorax.  Pulmonary vascularity and heart size are normal. Increased slight bibasilar atelectasis.  IMPRESSION: Slight increase in the mild bibasilar atelectasis.   Electronically Signed   By: Geanie Cooley M.D.   On: 10/10/2013 07:59     Assessment/Plan: S/P Procedure(s) (LRB): CORONARY ARTERY BYPASS GRAFTING (CABG) (N/A) INTRAOPERATIVE TRANSESOPHAGEAL ECHOCARDIOGRAM (N/A) Pulm- Thick, purulent secretions being suctioned from trach.  Started on [place] yesterday, sputum culture pending.  Co-ox 40.  Continue aggressive pulm toilet, try to wean as able. CV- SR, BPs generally stable.  Still on Dopamine, Levophed, Milrinone. IABP d/c'ed yesterday.Wean drips as tolerated. Expected postop blood loss anemia- H/H down some after tx yesterday.  Watch. Continue ICU care.  COLLINS,GINA H 10/11/2013 7:44  AM  Patient seen and examined. Agree with above He is not ready to wean from vent.  Cardiac index is good. Will wean dopamine to 3 and wean levophed off if possible

## 2013-10-12 ENCOUNTER — Inpatient Hospital Stay (HOSPITAL_COMMUNITY): Payer: Medicare Other

## 2013-10-12 DIAGNOSIS — E876 Hypokalemia: Secondary | ICD-10-CM

## 2013-10-12 LAB — CBC
HCT: 26.4 % — ABNORMAL LOW (ref 39.0–52.0)
Hemoglobin: 9.1 g/dL — ABNORMAL LOW (ref 13.0–17.0)
MCH: 31 pg (ref 26.0–34.0)
MCHC: 34.5 g/dL (ref 30.0–36.0)
MCV: 89.8 fL (ref 78.0–100.0)
Platelets: 199 10*3/uL (ref 150–400)
RBC: 2.94 MIL/uL — ABNORMAL LOW (ref 4.22–5.81)
RDW: 14.6 % (ref 11.5–15.5)
WBC: 7.1 10*3/uL (ref 4.0–10.5)

## 2013-10-12 LAB — COMPREHENSIVE METABOLIC PANEL
ALT: 47 U/L (ref 0–53)
AST: 37 U/L (ref 0–37)
Albumin: 2.2 g/dL — ABNORMAL LOW (ref 3.5–5.2)
Alkaline Phosphatase: 51 U/L (ref 39–117)
BUN: 32 mg/dL — ABNORMAL HIGH (ref 6–23)
CO2: 30 mEq/L (ref 19–32)
Calcium: 7.6 mg/dL — ABNORMAL LOW (ref 8.4–10.5)
Chloride: 99 mEq/L (ref 96–112)
Creatinine, Ser: 0.99 mg/dL (ref 0.50–1.35)
GFR calc Af Amer: 89 mL/min — ABNORMAL LOW (ref >90)
GFR calc non Af Amer: 77 mL/min — ABNORMAL LOW (ref >90)
Glucose, Bld: 112 mg/dL — ABNORMAL HIGH (ref 70–99)
Potassium: 3.1 mEq/L — ABNORMAL LOW (ref 3.5–5.1)
Sodium: 137 mEq/L (ref 135–145)
Total Bilirubin: 2.2 mg/dL — ABNORMAL HIGH (ref 0.3–1.2)
Total Protein: 5.1 g/dL — ABNORMAL LOW (ref 6.0–8.3)

## 2013-10-12 LAB — GLUCOSE, CAPILLARY
Glucose-Capillary: 118 mg/dL — ABNORMAL HIGH (ref 70–99)
Glucose-Capillary: 91 mg/dL (ref 70–99)
Glucose-Capillary: 113 mg/dL — ABNORMAL HIGH (ref 70–99)

## 2013-10-12 LAB — POCT I-STAT 4, (NA,K, GLUC, HGB,HCT)
HCT: 26 % — ABNORMAL LOW (ref 39.0–52.0)
Hemoglobin: 8.8 g/dL — ABNORMAL LOW (ref 13.0–17.0)
Potassium: 3.4 mEq/L — ABNORMAL LOW (ref 3.5–5.1)
Sodium: 139 mEq/L (ref 135–145)

## 2013-10-12 LAB — CARBOXYHEMOGLOBIN
Carboxyhemoglobin: 1.2 % (ref 0.5–1.5)
Methemoglobin: 1.3 % (ref 0.0–1.5)
O2 Saturation: 45.5 %
Total hemoglobin: 11.5 g/dL — ABNORMAL LOW (ref 13.5–18.0)

## 2013-10-12 LAB — POCT I-STAT 3, ART BLOOD GAS (G3+)
Bicarbonate: 28.8 mEq/L — ABNORMAL HIGH (ref 20.0–24.0)
Patient temperature: 37.4
pCO2 arterial: 39.4 mmHg (ref 35.0–45.0)
pO2, Arterial: 64 mmHg — ABNORMAL LOW (ref 80.0–100.0)

## 2013-10-12 MED ORDER — POTASSIUM CHLORIDE 10 MEQ/50ML IV SOLN
10.0000 meq | INTRAVENOUS | Status: AC
  Administered 2013-10-12 (×4): 10 meq via INTRAVENOUS
  Filled 2013-10-12: qty 50

## 2013-10-12 MED ORDER — LORAZEPAM BOLUS VIA INFUSION
0.5000 mg | INTRAVENOUS | Status: DC | PRN
  Filled 2013-10-12: qty 1

## 2013-10-12 MED ORDER — POTASSIUM CHLORIDE 10 MEQ/50ML IV SOLN
10.0000 meq | INTRAVENOUS | Status: AC
  Administered 2013-10-12 (×3): 10 meq via INTRAVENOUS
  Filled 2013-10-12: qty 50

## 2013-10-12 MED ORDER — SODIUM CHLORIDE 0.9 % IJ SOLN
10.0000 mL | Freq: Two times a day (BID) | INTRAMUSCULAR | Status: DC
  Administered 2013-10-12 – 2013-10-13 (×2): 10 mL

## 2013-10-12 MED ORDER — INSULIN ASPART 100 UNIT/ML ~~LOC~~ SOLN
0.0000 [IU] | SUBCUTANEOUS | Status: DC
  Administered 2013-10-12 – 2013-10-20 (×17): 2 [IU] via SUBCUTANEOUS

## 2013-10-12 MED ORDER — POTASSIUM CHLORIDE 10 MEQ/50ML IV SOLN
10.0000 meq | INTRAVENOUS | Status: AC | PRN
  Administered 2013-10-12 (×3): 10 meq via INTRAVENOUS
  Filled 2013-10-12 (×3): qty 50

## 2013-10-12 MED ORDER — SODIUM CHLORIDE 0.9 % IJ SOLN
10.0000 mL | INTRAMUSCULAR | Status: DC | PRN
  Administered 2013-10-22 (×3): 10 mL
  Administered 2013-10-24 – 2013-10-25 (×3): 30 mL

## 2013-10-12 NOTE — Progress Notes (Signed)
Subjective:  Intubated, sedated on vent; day 4 s/p CABG  Objective:   Vital Signs in the last 24 hours: Temp:  [99.3 F (37.4 C)-100.2 F (37.9 C)] 99.3 F (37.4 C) (11/08 1000) Pulse Rate:  [73-92] 78 (11/08 1000) Resp:  [12-26] 18 (11/08 1000) BP: (71-125)/(40-69) 104/58 mmHg (11/08 1000) SpO2:  [94 %-100 %] 97 % (11/08 1000) Arterial Line BP: (70-146)/(46-70) 111/55 mmHg (11/08 1000) FiO2 (%):  [40 %-60 %] 40 % (11/08 0827)  Intake/Output from previous day: 11/07 0701 - 11/08 0700 In: 2646.4 [I.V.:2336.4; NG/GT:60; IV Piggyback:250] Out: 2880 [Urine:2410; Emesis/NG output:400; Chest Tube:70]  Scheduled: . acetaminophen  1,000 mg Oral Q6H   Or  . acetaminophen (TYLENOL) oral liquid 160 mg/5 mL  1,000 mg Per Tube Q6H  . ALPRAZolam  1 mg Oral TID  . antiseptic oral rinse  15 mL Mouth Rinse QID  . aspirin EC  325 mg Oral Daily   Or  . aspirin  324 mg Per Tube Daily  . atorvastatin  80 mg Oral q1800  . bisacodyl  10 mg Oral Daily   Or  . bisacodyl  10 mg Rectal Daily  . budesonide (PULMICORT) nebulizer solution  0.25 mg Nebulization BID  . cefTAZidime (FORTAZ)  IV  1 g Intravenous Q8H  . chlorhexidine  15 mL Mouth Rinse BID  . docusate sodium  200 mg Oral Daily  . enoxaparin (LOVENOX) injection  40 mg Subcutaneous Q24H  . insulin aspart  0-24 Units Subcutaneous Q4H  . levalbuterol  1.25 mg Nebulization Q6H  . metoprolol tartrate  12.5 mg Oral BID   Or  . metoprolol tartrate  12.5 mg Per Tube BID  . pantoprazole (PROTONIX) IV  40 mg Intravenous Q24H  . potassium chloride  10 mEq Intravenous Q1 Hr x 2  . sodium chloride  3 mL Intravenous Q12H   . sodium chloride 20 mL/hr at 10/11/13 0900  . sodium chloride 10 mL/hr at 10/12/13 0100  . sodium chloride    . dexmedetomidine 0.9 mcg/kg/hr (10/12/13 0923)  . DOPamine 3 mcg/kg/min (10/12/13 0600)  . furosemide (LASIX) infusion 8 mg/hr (10/12/13 0600)  . lactated ringers 20 mL/hr at 10/11/13 2342  . norepinephrine  (LEVOPHED) Adult infusion Stopped (10/12/13 0515)  . phenylephrine (NEO-SYNEPHRINE) Adult infusion Stopped (10/08/13 1700)   Physical Exam:   General appearance: sedated on vent Neck: no JVD and supple, symmetrical, trachea midline Lungs: decreased BS; no wheezing now Heart: regular rate and rhythm, 1/6 sem Abdomen: mildly distended, BS + but diminished Extremities: mild edema   Rate: 78  Rhythm: normal sinus rhythm  Lab Results:    Recent Labs  10/11/13 0410 10/12/13 0400  NA 136 137  K 3.2* 3.1*  CL 99 99  CO2 28 30  GLUCOSE 105* 112*  BUN 26* 32*  CREATININE 0.97 0.99   No results found for this basename: TROPONINI, CK, MB,  in the last 72 hours Hepatic Function Panel  Recent Labs  10/12/13 0400  PROT 5.1*  ALBUMIN 2.2*  AST 37  ALT 47  ALKPHOS 51  BILITOT 2.2*    Recent Labs  10/10/13 0405  INR 1.30   BNP (last 3 results)  Recent Labs  10/07/13 0400  PROBNP 3092.0*    Lipid Panel  No results found for this basename: chol, trig, hdl, cholhdl, vldl, ldlcalc      Imaging:  Dg Chest Port 1 View  10/12/2013   CLINICAL DATA:  Status post CABG.  EXAM:  PORTABLE CHEST - 1 VIEW  COMPARISON:  10/11/2013  FINDINGS: Endotracheal tube remains with the tip approximately 4.5 cm above the carina. Swan-Ganz catheter tip is in the right lower lobe pulmonary artery and could be retracted several cm. Bilateral lower lobe atelectasis remains which is relatively stable. No pulmonary edema, significant pleural fluid or pneumothorax is identified.  IMPRESSION: Bilateral lower lobe atelectasis. No pneumothorax.   Electronically Signed   By: Irish Lack M.D.   On: 10/12/2013 09:43   Dg Chest Port 1 View  10/11/2013   CLINICAL DATA:  Ventilator dependent respiratory failure. Subsequent encounter for bibasilar atelectasis.  EXAM: PORTABLE CHEST - 1 VIEW  COMPARISON:  Portable examinations yesterday dating back to 10/06/2013.  FINDINGS: Endotracheal tube tip in  satisfactory position projecting approximately 4cm above the carina. Leftjugularcentral venous catheter tip projects over the lower SVC. Nasogastric tube courses below the diaphragm into the stomach. Left chest tube in place with no pneumothorax. Slight improved aeration left lung base since yesterday, though moderate atelectasis persists. No change in the mild to moderate right basilar atelectasis. No new pulmonary parenchymal abnormalities.  IMPRESSION: 1. Support apparatus satisfactory. 2. Left chest tube in place with no pneumothorax. 3. Improved aeration in the left lung base since yesterday with mild moderate atelectasis persisting. 4. Stable mild to moderate right basilar atelectasis. 5. No new abnormalities.   Electronically Signed   By: Hulan Saas M.D.   On: 10/11/2013 07:40      Assessment/Plan:   Principal Problem:   STEMI of anterior wall,  10/05/13 Active Problems:   Essential hypertension   Obesity (BMI 35.0-39.9 without comorbidity)   S/P urgent LAD PTCA 10/06/13 with IABP for LAD perfusion   Anxiety, claustrophobia   DJD  lumbar- surg August 2013   Sleep apnea- C-pap intol   AAA (abdominal aortic aneurysm)-hx of a "small" AAA found July 2013 (nothing in EPIC)   S/P CABG x 2: LIMA-LAD, SVG-DIAG  Stable hemodynamics; now off milrinone. Thick secretions persist. Cr .99. K 3.1;replete and consider re-check Mg. Mild ileus.    Lennette Bihari, MD, [facility] 10/12/2013, 10:27 AM

## 2013-10-12 NOTE — Progress Notes (Signed)
Peripherally Inserted Central Catheter/Midline Placement  The IV Nurse has discussed with the patient and/or persons authorized to consent for the patient, the purpose of this procedure and the potential benefits and risks involved with this procedure.  The benefits include less needle sticks, lab draws from the catheter and patient may be discharged home with the catheter.  Risks include, but not limited to, infection, bleeding, blood clot (thrombus formation), and puncture of an artery; nerve damage and irregular heat beat.  Alternatives to this procedure were also discussed.  PICC/Midline Placement Documentation  PICC Triple Lumen 10/12/13 PICC Right Basilic 45 cm 0 cm (Active)       Antonio Bernard 10/12/2013, 11:38 AM

## 2013-10-12 NOTE — Progress Notes (Signed)
4 Days Post-Op Procedure(s) (LRB): CORONARY ARTERY BYPASS GRAFTING (CABG) (N/A) INTRAOPERATIVE TRANSESOPHAGEAL ECHOCARDIOGRAM (N/A) Subjective: Intubated, sedated Agitated earlier  Objective: Vital signs in last 24 hours: Temp:  [99.3 F (37.4 C)-100.2 F (37.9 C)] 99.3 F (37.4 C) (11/08 0800) Pulse Rate:  [73-93] 75 (11/08 0800) Cardiac Rhythm:  [-] Normal sinus rhythm (11/08 0700) Resp:  [12-26] 12 (11/08 0800) BP: (71-125)/(40-69) 101/57 mmHg (11/08 0800) SpO2:  [94 %-100 %] 99 % (11/08 0800) Arterial Line BP: (70-146)/(46-70) 123/57 mmHg (11/08 0800) FiO2 (%):  [40 %-60 %] 40 % (11/08 0827)  Hemodynamic parameters for last 24 hours: PAP: (25-307)/(15-36) 32/20 mmHg CO:  [5.5 L/min] 5.5 L/min CI:  [2.3 L/min/m2] 2.3 L/min/m2  Intake/Output from previous day: 11/07 0701 - 11/08 0700 In: 2646.4 [I.V.:2336.4; NG/GT:60; IV Piggyback:250] Out: 2880 [Urine:2410; Emesis/NG output:400; Chest Tube:70] Intake/Output this shift: Total I/O In: 155.1 [I.V.:105.1; IV Piggyback:50] Out: 100 [Urine:100]  General appearance: sedated Heart: regular rate and rhythm Lungs: faint wheezes bilaterally Abdomen: distended, hypoactive BS  Lab Results:  Recent Labs  10/11/13 0410 10/12/13 0400  WBC 7.8 7.1  HGB 9.2* 9.1*  HCT 26.0* 26.4*  PLT 176 199   BMET:  Recent Labs  10/11/13 0410 10/12/13 0400  NA 136 137  K 3.2* 3.1*  CL 99 99  CO2 28 30  GLUCOSE 105* 112*  BUN 26* 32*  CREATININE 0.97 0.99  CALCIUM 7.3* 7.6*    PT/INR:  Recent Labs  10/10/13 0405  LABPROT 15.9*  INR 1.30   ABG    Component Value Date/Time   PHART 7.474* 10/12/2013 0415   HCO3 28.8* 10/12/2013 0415   TCO2 30 10/12/2013 0415   ACIDBASEDEF 2.0 10/08/2013 1524   O2SAT 93.0 10/12/2013 0415   CBG (last 3)   Recent Labs  10/12/13 0150 10/12/13 0417 10/12/13 0810  GLUCAP 120* 91 113*    Assessment/Plan: S/P Procedure(s) (LRB): CORONARY ARTERY BYPASS GRAFTING (CABG)  (N/A) INTRAOPERATIVE TRANSESOPHAGEAL ECHOCARDIOGRAM (N/A) POD # 4 emergency CABG CV- stable, good index  Dc milrinone  RESP- VDRF - still has thick secretions- on empiric ceftazidime  Component of pulmonary edema as well  Try to wean vent but I'm doubtful he'll be extubatable today  RENAL- creatinine OK, increase lasix gtt  Hypokalemia- supplement  GI- some distention and hypoactive BS c/w ileus  ENDO- CBG well controlled  SCD for DVT prophylaxis   LOS: 7 days    Antonio Bernard C 10/12/2013

## 2013-10-12 NOTE — Progress Notes (Signed)
Respiratory therapy note- placed back to full support, increase amount of frothy secretions, however no increase in WOB, patient is alert and responsive.

## 2013-10-12 NOTE — Progress Notes (Signed)
PM ROUNDS  He is resting quietly with family at bedside. The family is extremely anxious. His daughter says he is prone to panic attacks, and they are concerned he will cause damage to his heart from his anxiety.  He currently is very calm with no agitation  BP 104/54  Pulse 77  Temp(Src) 99.2 F (37.3 C) (Oral)  Resp 18  Ht 6\' 1"  (1.854 m)  Wt 282 lb 13.6 oz (128.3 kg)  BMI 37.33 kg/m2  SpO2 98%   Intake/Output Summary (Last 24 hours) at 10/12/13 1758 Last data filed at 10/12/13 1711  Gross per 24 hour  Intake 2751.05 ml  Output   2140 ml  Net 611.05 ml   K= 3.4  He was on PS for 4 hours earlier today. Placed back on PRVC due to frothy secretions  He is still very volume overloaded. His UO has picked up a bit over the last couple of hours. Continue dopamine and lasix drips, then hopefully can wean and extubate tomorrow

## 2013-10-13 ENCOUNTER — Inpatient Hospital Stay (HOSPITAL_COMMUNITY): Payer: Medicare Other

## 2013-10-13 LAB — CBC
HCT: 27 % — ABNORMAL LOW (ref 39.0–52.0)
Hemoglobin: 9.1 g/dL — ABNORMAL LOW (ref 13.0–17.0)
MCH: 31 pg (ref 26.0–34.0)
MCHC: 33.7 g/dL (ref 30.0–36.0)
MCV: 91.8 fL (ref 78.0–100.0)
Platelets: 248 10*3/uL (ref 150–400)
RBC: 2.94 MIL/uL — ABNORMAL LOW (ref 4.22–5.81)
RDW: 14.8 % (ref 11.5–15.5)
WBC: 7.1 10*3/uL (ref 4.0–10.5)

## 2013-10-13 LAB — GLUCOSE, CAPILLARY
Glucose-Capillary: 101 mg/dL — ABNORMAL HIGH (ref 70–99)
Glucose-Capillary: 93 mg/dL (ref 70–99)
Glucose-Capillary: 99 mg/dL (ref 70–99)
Glucose-Capillary: 96 mg/dL (ref 70–99)

## 2013-10-13 LAB — COMPREHENSIVE METABOLIC PANEL
ALT: 38 U/L (ref 0–53)
AST: 32 U/L (ref 0–37)
Albumin: 2.1 g/dL — ABNORMAL LOW (ref 3.5–5.2)
Alkaline Phosphatase: 62 U/L (ref 39–117)
BUN: 38 mg/dL — ABNORMAL HIGH (ref 6–23)
CO2: 29 mEq/L (ref 19–32)
Calcium: 7.2 mg/dL — ABNORMAL LOW (ref 8.4–10.5)
Chloride: 102 mEq/L (ref 96–112)
Creatinine, Ser: 1.06 mg/dL (ref 0.50–1.35)
GFR calc Af Amer: 76 mL/min — ABNORMAL LOW (ref >90)
GFR calc non Af Amer: 66 mL/min — ABNORMAL LOW (ref >90)
Glucose, Bld: 103 mg/dL — ABNORMAL HIGH (ref 70–99)
Potassium: 3.3 mEq/L — ABNORMAL LOW (ref 3.5–5.1)
Sodium: 141 mEq/L (ref 135–145)
Total Bilirubin: 2 mg/dL — ABNORMAL HIGH (ref 0.3–1.2)
Total Protein: 5.2 g/dL — ABNORMAL LOW (ref 6.0–8.3)

## 2013-10-13 LAB — POCT I-STAT 3, ART BLOOD GAS (G3+)
Acid-Base Excess: 6 mmol/L — ABNORMAL HIGH (ref 0.0–2.0)
O2 Saturation: 95 %
Patient temperature: 99
Patient temperature: 98
TCO2: 29 mmol/L (ref 0–100)
pCO2 arterial: 40.6 mmHg (ref 35.0–45.0)
pH, Arterial: 7.451 — ABNORMAL HIGH (ref 7.350–7.450)
pO2, Arterial: 78 mmHg — ABNORMAL LOW (ref 80.0–100.0)

## 2013-10-13 LAB — POCT I-STAT 4, (NA,K, GLUC, HGB,HCT)
Glucose, Bld: 112 mg/dL — ABNORMAL HIGH (ref 70–99)
HCT: 26 % — ABNORMAL LOW (ref 39.0–52.0)
Hemoglobin: 8.8 g/dL — ABNORMAL LOW (ref 13.0–17.0)
Potassium: 4 mEq/L (ref 3.5–5.1)

## 2013-10-13 LAB — CARBOXYHEMOGLOBIN
Carboxyhemoglobin: 1.4 % (ref 0.5–1.5)
Methemoglobin: 1.6 % — ABNORMAL HIGH (ref 0.0–1.5)
O2 Saturation: 53.3 %
Total hemoglobin: 9.2 g/dL — ABNORMAL LOW (ref 13.5–18.0)

## 2013-10-13 LAB — CLOSTRIDIUM DIFFICILE BY PCR: Toxigenic C. Difficile by PCR: NEGATIVE

## 2013-10-13 MED ORDER — ALPRAZOLAM 0.25 MG PO TABS
0.2500 mg | ORAL_TABLET | Freq: Three times a day (TID) | ORAL | Status: DC | PRN
  Administered 2013-10-14: 0.25 mg via ORAL
  Filled 2013-10-13: qty 1

## 2013-10-13 MED ORDER — POTASSIUM CHLORIDE 10 MEQ/50ML IV SOLN
10.0000 meq | INTRAVENOUS | Status: AC | PRN
  Administered 2013-10-13 (×3): 10 meq via INTRAVENOUS
  Filled 2013-10-13 (×3): qty 50

## 2013-10-13 MED ORDER — POTASSIUM CHLORIDE 10 MEQ/50ML IV SOLN
10.0000 meq | INTRAVENOUS | Status: AC
  Administered 2013-10-13 (×2): 10 meq via INTRAVENOUS

## 2013-10-13 NOTE — Progress Notes (Signed)
5 Days Post-Op Procedure(s) (LRB): CORONARY ARTERY BYPASS GRAFTING (CABG) (N/A) INTRAOPERATIVE TRANSESOPHAGEAL ECHOCARDIOGRAM (N/A) Subjective: Alert, denies pain, anxiety  Objective: Vital signs in last 24 hours: Temp:  [98.7 F (37.1 C)-99.7 F (37.6 C)] 99 F (37.2 C) (11/09 0400) Pulse Rate:  [73-81] 73 (11/09 0800) Cardiac Rhythm:  [-] Normal sinus rhythm (11/08 2000) Resp:  [6-24] 17 (11/09 0800) BP: (94-134)/(45-67) 104/61 mmHg (11/09 0800) SpO2:  [95 %-100 %] 97 % (11/09 0800) Arterial Line BP: (110-142)/(51-71) 116/60 mmHg (11/09 0800) FiO2 (%):  [40 %] 40 % (11/09 0410) Weight:  [274 lb 14.6 oz (124.7 kg)] 274 lb 14.6 oz (124.7 kg) (11/09 0600)  Hemodynamic parameters for last 24 hours: PAP: (33-48)/(21-34) 33/21 mmHg  Intake/Output from previous day: 11/08 0701 - 11/09 0700 In: 2281.1 [I.V.:1511.1; NG/GT:120; IV Piggyback:650] Out: 2665 [Urine:2165; Emesis/NG output:500] Intake/Output this shift: Total I/O In: 101.6 [I.V.:51.6; IV Piggyback:50] Out: 130 [Urine:130]  General appearance: alert and cooperative Neurologic: no focal deficit Heart: regular rate and rhythm Lungs: faint wheezes bilaterally Abdomen: normal findings: soft, non-tender  Lab Results:  Recent Labs  10/12/13 0400 10/12/13 1749 10/13/13 0400  WBC 7.1  --  7.1  HGB 9.1* 8.8* 9.1*  HCT 26.4* 26.0* 27.0*  PLT 199  --  248   BMET:  Recent Labs  10/12/13 0400 10/12/13 1749 10/13/13 0400  NA 137 139 141  K 3.1* 3.4* 3.3*  CL 99  --  102  CO2 30  --  29  GLUCOSE 112* 120* 103*  BUN 32*  --  38*  CREATININE 0.99  --  1.06  CALCIUM 7.6*  --  7.2*    PT/INR: No results found for this basename: LABPROT, INR,  in the last 72 hours ABG    Component Value Date/Time   PHART 7.451* 10/13/2013 0432   HCO3 28.2* 10/13/2013 0432   TCO2 29 10/13/2013 0432   ACIDBASEDEF 2.0 10/08/2013 1524   O2SAT 96.0 10/13/2013 0432   CBG (last 3)   Recent Labs  10/12/13 2008 10/13/13 0018  10/13/13 0348  GLUCAP 101* 93 93    Assessment/Plan: S/P Procedure(s) (LRB): CORONARY ARTERY BYPASS GRAFTING (CABG) (N/A) INTRAOPERATIVE TRANSESOPHAGEAL ECHOCARDIOGRAM (N/A) - CV- stable, will continue dopamine to assist with diuresis  RESP- VDRF- CXR still shows edema but improving  Gas exchange good on 40%  Will try to extubate this AM  RENAL- creatinine OK, UO picking up,   Hypokalemia- supplement  ENDO- CBG well controlled  Anxiety- appears calm and cooperative this AM   LOS: 8 days    Darean Rote C 10/13/2013

## 2013-10-13 NOTE — Progress Notes (Signed)
Subjective:  Day 5 s/p CABG; Extubated, talking; had a BM earlier  Objective:   Vital Signs in the last 24 hours: Temp:  [98 F (36.7 C)-99.7 F (37.6 C)] 98 F (36.7 C) (11/09 0843) Pulse Rate:  [73-82] 78 (11/09 1100) Resp:  [6-26] 26 (11/09 1100) BP: (94-134)/(45-67) 104/57 mmHg (11/09 1100) SpO2:  [93 %-100 %] 94 % (11/09 1100) Arterial Line BP: (113-142)/(59-71) 126/63 mmHg (11/09 1100) FiO2 (%):  [40 %] 40 % (11/09 0730) Weight:  [274 lb 14.6 oz (124.7 kg)] 274 lb 14.6 oz (124.7 kg) (11/09 0600)  Intake/Output from previous day: 11/08 0701 - 11/09 0700 In: 2281.1 [I.V.:1511.1; NG/GT:120; IV Piggyback:650] Out: 2665 [Urine:2165; Emesis/NG output:500]  Medications: . acetaminophen  1,000 mg Oral Q6H   Or  . acetaminophen (TYLENOL) oral liquid 160 mg/5 mL  1,000 mg Per Tube Q6H  . ALPRAZolam  1 mg Oral TID  . aspirin EC  325 mg Oral Daily   Or  . aspirin  324 mg Per Tube Daily  . atorvastatin  80 mg Oral q1800  . bisacodyl  10 mg Oral Daily   Or  . bisacodyl  10 mg Rectal Daily  . budesonide (PULMICORT) nebulizer solution  0.25 mg Nebulization BID  . cefTAZidime (FORTAZ)  IV  1 g Intravenous Q8H  . docusate sodium  200 mg Oral Daily  . enoxaparin (LOVENOX) injection  40 mg Subcutaneous Q24H  . insulin aspart  0-24 Units Subcutaneous Q4H  . levalbuterol  1.25 mg Nebulization Q6H  . metoprolol tartrate  12.5 mg Oral BID   Or  . metoprolol tartrate  12.5 mg Per Tube BID  . pantoprazole (PROTONIX) IV  40 mg Intravenous Q24H  . sodium chloride  10-40 mL Intracatheter Q12H  . sodium chloride  3 mL Intravenous Q12H    . sodium chloride 20 mL/hr at 10/12/13 1236  . sodium chloride 10 mL/hr at 10/13/13 0700  . sodium chloride    . dexmedetomidine 0.5 mcg/kg/hr (10/13/13 0826)  . DOPamine 3 mcg/kg/min (10/13/13 0700)  . furosemide (LASIX) infusion 10 mg/hr (10/13/13 0700)  . lactated ringers 10 mL/hr at 10/13/13 0700  . norepinephrine (LEVOPHED) Adult infusion  Stopped (10/12/13 0515)  . phenylephrine (NEO-SYNEPHRINE) Adult infusion Stopped (10/08/13 1700)    Physical Exam:   General appearance: alert and no distress Neck: no JVD and supple, symmetrical, trachea midline Lungs: decreased BS Heart: regular rate and rhythm Abdomen: soft, non-tender; bowel sounds normal; no masses,  no organomegaly Extremities: mild edema   Rate: 78  Rhythm: normal sinus rhythm  Lab Results:    Recent Labs  10/12/13 0400 10/12/13 1749 10/13/13 0400  NA 137 139 141  K 3.1* 3.4* 3.3*  CL 99  --  102  CO2 30  --  29  GLUCOSE 112* 120* 103*  BUN 32*  --  38*  CREATININE 0.99  --  1.06   No results found for this basename: TROPONINI, CK, MB,  in the last 72 hours Hepatic Function Panel  Recent Labs  10/13/13 0400  PROT 5.2*  ALBUMIN 2.1*  AST 32  ALT 38  ALKPHOS 62  BILITOT 2.0*   No results found for this basename: INR,  in the last 72 hours BNP (last 3 results)  Recent Labs  10/07/13 0400  PROBNP 3092.0*    Lipid Panel  No results found for this basename: chol, trig, hdl, cholhdl, vldl, ldlcalc      Imaging:  Dg Chest Port 1  View  10/13/2013   CLINICAL DATA:  Shortness of breath, followup pulmonary edema  EXAM: PORTABLE CHEST - 1 VIEW  COMPARISON:  10/12/2013, 10/11/2013, 10/08/2013  FINDINGS: There has been interval removal of the Swan-Ganz catheter. The left IJ sheath remains in place. The right-sided PICC line is in satisfactory position. There is an endotracheal tube with the tip 4.5 cm above the carina. There is a nasogastric tube coursing below the diaphragm with the tip not visualized.  There are low lung volumes. There is no pneumothorax. There is no definite pleural effusion. The left costophrenic angle is excluded from the field of view. There is bibasilar airspace disease likely representing atelectasis. The heart size is mildly enlarged, but stable. Changes secondary to CABG noted.  IMPRESSION: Bibasilar atelectasis.   Stable support apparatus.   Electronically Signed   By: Elige Ko   On: 10/13/2013 08:22   Dg Chest Port 1 View  10/12/2013   CLINICAL DATA:  Post PICC placement  EXAM: PORTABLE CHEST - 1 VIEW  COMPARISON:  DG CHEST 1V PORT dated 10/12/2013  FINDINGS: Interval placement of a right PICC line with tip in the distal SVC. Endotracheal tube, NG tube, and left central venous line are unchanged. Stable enlarged cardiac silhouette. There is bibasilar atelectasis and mild central venous congestion unchanged.  IMPRESSION: 1. Interval placement of right PICC line in good position. 2. Stable support apparatus. 3. Bibasilar atelectasis.   Electronically Signed   By: Genevive Bi M.D.   On: 10/12/2013 11:38   Dg Chest Port 1 View  10/12/2013   CLINICAL DATA:  Status post CABG.  EXAM: PORTABLE CHEST - 1 VIEW  COMPARISON:  10/11/2013  FINDINGS: Endotracheal tube remains with the tip approximately 4.5 cm above the carina. Swan-Ganz catheter tip is in the right lower lobe pulmonary artery and could be retracted several cm. Bilateral lower lobe atelectasis remains which is relatively stable. No pulmonary edema, significant pleural fluid or pneumothorax is identified.  IMPRESSION: Bilateral lower lobe atelectasis. No pneumothorax.   Electronically Signed   By: Irish Lack M.D.   On: 10/12/2013 09:43      Assessment/Plan:   Principal Problem:   STEMI of anterior wall,  10/05/13 Active Problems:   Essential hypertension   Obesity (BMI 35.0-39.9 without comorbidity)   S/P urgent LAD PTCA 10/06/13 with IABP for LAD perfusion   Anxiety, claustrophobia   DJD  lumbar- surg August 2013   Sleep apnea- C-pap intol   AAA (abdominal aortic aneurysm)-hx of a "small" AAA found July 2013 (nothing in EPIC)   S/P CABG x 2: LIMA-LAD, SVG-DIAG  Now off pressors. Extubated. O 2 sat 97% BP stable. Continue to diurese.     Antonio Bihari, MD, [facility] 10/13/2013, 12:03 PM

## 2013-10-13 NOTE — Procedures (Signed)
Extubation Procedure Note  Patient Details:   Name: BRYKER FLETCHALL DOB: 09-12-1933 MRN: [id number]   Airway Documentation:     Evaluation  O2 sats: stable throughout Complications: No apparent complications Patient did tolerate procedure well. Bilateral Breath Sounds: Rhonchi Suctioning: Airway Yes NIF-40 FVC-1.0L Positive cuff leak with good vocalization post extubation Placed on 6l/min , sp02-93%  Newt Lukes 10/13/2013, 9:39 AM

## 2013-10-13 NOTE — Progress Notes (Signed)
PM ROUNDS  C/o some incisional pain   Had diarrhea earlier  BP 109/56  Pulse 86  Temp(Src) 97.9 F (36.6 C) (Oral)  Resp 25  Ht 6\' 1"  (1.854 m)  Wt 274 lb 14.6 oz (124.7 kg)  BMI 36.28 kg/m2  SpO2 95%   Intake/Output Summary (Last 24 hours) at 10/13/13 1722 Last data filed at 10/13/13 1707  Gross per 24 hour  Intake 1890.6 ml  Output   2518 ml  Net -627.4 ml    Abdomen is distended but nontender  Overall looks good, has improved significantly over past 48 hours  Continue current care

## 2013-10-14 ENCOUNTER — Inpatient Hospital Stay (HOSPITAL_COMMUNITY): Payer: Medicare Other

## 2013-10-14 LAB — COMPREHENSIVE METABOLIC PANEL
ALT: 42 U/L (ref 0–53)
AST: 43 U/L — ABNORMAL HIGH (ref 0–37)
Albumin: 2.5 g/dL — ABNORMAL LOW (ref 3.5–5.2)
Alkaline Phosphatase: 73 U/L (ref 39–117)
BUN: 47 mg/dL — ABNORMAL HIGH (ref 6–23)
CO2: 25 mEq/L (ref 19–32)
Calcium: 7.7 mg/dL — ABNORMAL LOW (ref 8.4–10.5)
Chloride: 101 mEq/L (ref 96–112)
Creatinine, Ser: 1.15 mg/dL (ref 0.50–1.35)
GFR calc Af Amer: 69 mL/min — ABNORMAL LOW (ref >90)
GFR calc non Af Amer: 60 mL/min — ABNORMAL LOW (ref >90)
Glucose, Bld: 118 mg/dL — ABNORMAL HIGH (ref 70–99)
Potassium: 3.3 mEq/L — ABNORMAL LOW (ref 3.5–5.1)
Sodium: 143 mEq/L (ref 135–145)
Total Bilirubin: 2 mg/dL — ABNORMAL HIGH (ref 0.3–1.2)
Total Protein: 5.7 g/dL — ABNORMAL LOW (ref 6.0–8.3)

## 2013-10-14 LAB — BLOOD GAS, ARTERIAL
Acid-Base Excess: 1.2 mmol/L (ref 0.0–2.0)
Bicarbonate: 25.1 mEq/L — ABNORMAL HIGH (ref 20.0–24.0)
Drawn by: 347621
Expiratory PAP: 5
FIO2: 0.4 %
Inspiratory PAP: 10
Mode: POSITIVE
O2 Saturation: 93.3 %
Patient temperature: 98.6
RATE: 15 resp/min
TCO2: 26.3 mmol/L (ref 0–100)
pCO2 arterial: 39 mmHg (ref 35.0–45.0)
pH, Arterial: 7.425 (ref 7.350–7.450)
pO2, Arterial: 68.2 mmHg — ABNORMAL LOW (ref 80.0–100.0)

## 2013-10-14 LAB — CBC
HCT: 28.4 % — ABNORMAL LOW (ref 39.0–52.0)
Hemoglobin: 9.4 g/dL — ABNORMAL LOW (ref 13.0–17.0)
MCH: 30.8 pg (ref 26.0–34.0)
MCHC: 33.1 g/dL (ref 30.0–36.0)
MCV: 93.1 fL (ref 78.0–100.0)
Platelets: 369 10*3/uL (ref 150–400)
RBC: 3.05 MIL/uL — ABNORMAL LOW (ref 4.22–5.81)
RDW: 14.9 % (ref 11.5–15.5)
WBC: 10.3 10*3/uL (ref 4.0–10.5)

## 2013-10-14 LAB — CARBOXYHEMOGLOBIN
Carboxyhemoglobin: 1.3 % (ref 0.5–1.5)
Methemoglobin: 1 % (ref 0.0–1.5)
O2 Saturation: 53.6 %
Total hemoglobin: 10.8 g/dL — ABNORMAL LOW (ref 13.5–18.0)

## 2013-10-14 LAB — GLUCOSE, CAPILLARY
Glucose-Capillary: 102 mg/dL — ABNORMAL HIGH (ref 70–99)
Glucose-Capillary: 122 mg/dL — ABNORMAL HIGH (ref 70–99)
Glucose-Capillary: 129 mg/dL — ABNORMAL HIGH (ref 70–99)
Glucose-Capillary: 131 mg/dL — ABNORMAL HIGH (ref 70–99)

## 2013-10-14 MED ORDER — DOPAMINE-DEXTROSE 3.2-5 MG/ML-% IV SOLN
3.0000 ug/kg/min | INTRAVENOUS | Status: DC
  Administered 2013-10-14: 2.5 ug/kg/min via INTRAVENOUS
  Administered 2013-10-16 – 2013-10-19 (×3): 3 ug/kg/min via INTRAVENOUS
  Filled 2013-10-14 (×4): qty 250

## 2013-10-14 MED ORDER — ALPRAZOLAM 0.25 MG PO TABS
0.2500 mg | ORAL_TABLET | Freq: Once | ORAL | Status: AC
  Administered 2013-10-14: 0.25 mg via ORAL
  Filled 2013-10-14: qty 1

## 2013-10-14 MED ORDER — ALBUMIN HUMAN 5 % IV SOLN: 12.5000 g | Freq: Once | INTRAVENOUS | Status: AC

## 2013-10-14 MED ORDER — TRAMADOL HCL 50 MG PO TABS
50.0000 mg | ORAL_TABLET | Freq: Four times a day (QID) | ORAL | Status: DC | PRN
  Administered 2013-10-14 – 2013-10-24 (×8): 50 mg via ORAL
  Filled 2013-10-14 (×8): qty 1

## 2013-10-14 MED ORDER — POTASSIUM CHLORIDE 10 MEQ/50ML IV SOLN
10.0000 meq | Freq: Once | INTRAVENOUS | Status: AC
  Administered 2013-10-14: 10 meq via INTRAVENOUS
  Filled 2013-10-14: qty 50

## 2013-10-14 MED ORDER — MILRINONE IN DEXTROSE 20 MG/100ML IV SOLN
0.1000 ug/kg/min | INTRAVENOUS | Status: DC
  Administered 2013-10-14 (×2): 0.25 ug/kg/min via INTRAVENOUS
  Administered 2013-10-15 – 2013-10-19 (×8): 0.2 ug/kg/min via INTRAVENOUS
  Administered 2013-10-20 (×3): 0.1 ug/kg/min via INTRAVENOUS
  Filled 2013-10-14 (×12): qty 100

## 2013-10-14 MED ORDER — CLONAZEPAM 1 MG PO TABS
1.0000 mg | ORAL_TABLET | Freq: Every day | ORAL | Status: DC
  Administered 2013-10-14: 1 mg via ORAL
  Filled 2013-10-14: qty 1

## 2013-10-14 MED ORDER — ALPRAZOLAM 0.25 MG PO TABS
0.5000 mg | ORAL_TABLET | Freq: Three times a day (TID) | ORAL | Status: DC | PRN
  Administered 2013-10-14 – 2013-10-25 (×26): 0.5 mg via ORAL
  Filled 2013-10-14 (×4): qty 1
  Filled 2013-10-14 (×2): qty 2
  Filled 2013-10-14 (×4): qty 1
  Filled 2013-10-14: qty 2
  Filled 2013-10-14 (×4): qty 1
  Filled 2013-10-14: qty 2
  Filled 2013-10-14: qty 1
  Filled 2013-10-14 (×2): qty 2
  Filled 2013-10-14 (×6): qty 1
  Filled 2013-10-14 (×2): qty 2

## 2013-10-14 MED ORDER — POTASSIUM CHLORIDE 10 MEQ/50ML IV SOLN
10.0000 meq | INTRAVENOUS | Status: AC | PRN
  Administered 2013-10-14 (×3): 10 meq via INTRAVENOUS
  Filled 2013-10-14 (×3): qty 50

## 2013-10-14 MED ORDER — SODIUM CHLORIDE 0.9 % IV SOLN
INTRAVENOUS | Status: DC
  Administered 2013-10-14: 30 mL/h via INTRAVENOUS
  Administered 2013-10-15 – 2013-10-20 (×4): via INTRAVENOUS

## 2013-10-14 MED ORDER — ALBUMIN HUMAN 5 % IV SOLN
INTRAVENOUS | Status: AC
  Administered 2013-10-14: 12.5 g
  Filled 2013-10-14: qty 250

## 2013-10-14 NOTE — Progress Notes (Signed)
NUTRITION CONSULT/FOLLOW UP  Intervention:    Await swallow evaluation/SLP recommendations RD to follow for nutrition care plan, add interventions accordingly  Nutrition Dx:   Inadequate oral intake related to inability to eat as evidenced by NPO status, ongoing  New Goal:   Pt to meet >/= 90% of their estimated nutrition needs, currently unmet  Monitor:   PO diet advancement & intake, weight, labs, I/O's  Assessment:   Patient with PMH of HTN, OSA, obesity, AAA with anxiety; patient developed substernal chest pressure and his daughter called EMS; brought emergently to cardiac cath lab where he was evaluated -- CVTS consulted.  Patient s/p procedures 11/4:  CORONARY ARTERY BYPASS GRAFTING (CABG)  INTRAOPERATIVE TRANSESOPHAGEAL ECHOCARDIOGRAM  [facility] RIGHT THIGH  RD consult for assessment of nutrition requirements/status.  Patient extubated 11/9.  Reports he was eating well PTA.  No recent unintentional weight loss.  Bedside swallow evaluation pending.  Would benefit from addition of supplements once diet advanced -- RD to monitor.  Height: Ht Readings from Last 1 Encounters:  10/07/13 6\' 1"  (1.854 m)    Weight Status:   Wt Readings from Last 1 Encounters:  10/14/13 274 lb 7.6 oz (124.5 kg)    Re-estimated needs:  Kcal: 2150-2350 Protein: 125-135 gm Fluid: per MD  Skin: chest & leg surgical incisions   Diet Order: NPO   Intake/Output Summary (Last 24 hours) at 10/14/13 1135 Last data filed at 10/14/13 1000  Gross per 24 hour  Intake 1440.1 ml  Output   1684 ml  Net -243.9 ml    Labs:   Recent Labs Lab 10/08/13 2100  10/09/13 0400 10/09/13 1600  10/12/13 0400  10/13/13 0400 10/13/13 1646 10/14/13 0500  NA  --   < > 136 135  < > 137  < > 141 141 143  K  --   < > 3.8 3.7  < > 3.1*  < > 3.3* 4.0 3.3*  CL  --   < > 103 99  < > 99  --  102  --  101  CO2  --   --  24 28  < > 30  --  29  --  25  BUN  --   < > 27* 26*  < > 32*  --  38*  --  47*  CREATININE  0.96  < > 0.99 1.10  < > 0.99  --  1.06  --  1.15  CALCIUM  --   --  7.8* 8.1*  < > 7.6*  --  7.2*  --  7.7*  MG 3.0*  --  2.2 2.1  --   --   --   --   --   --   GLUCOSE  --   < > 98 133*  < > 112*  < > 103* 112* 118*  < > = values in this interval not displayed.  CBG (last 3)   Recent Labs  10/14/13 0013 10/14/13 0447 10/14/13 0737  GLUCAP 102* 113* 115*    Scheduled Meds: . aspirin EC  325 mg Oral Daily   Or  . aspirin  324 mg Per Tube Daily  . atorvastatin  80 mg Oral q1800  . bisacodyl  10 mg Oral Daily   Or  . bisacodyl  10 mg Rectal Daily  . budesonide (PULMICORT) nebulizer solution  0.25 mg Nebulization BID  . cefTAZidime (FORTAZ)  IV  1 g Intravenous Q8H  . docusate sodium  200 mg Oral Daily  .  enoxaparin (LOVENOX) injection  40 mg Subcutaneous Q24H  . insulin aspart  0-24 Units Subcutaneous Q4H  . levalbuterol  1.25 mg Nebulization Q6H  . metoprolol tartrate  12.5 mg Oral BID   Or  . metoprolol tartrate  12.5 mg Per Tube BID  . pantoprazole (PROTONIX) IV  40 mg Intravenous Q24H  . sodium chloride  10-40 mL Intracatheter Q12H  . sodium chloride  3 mL Intravenous Q12H    Continuous Infusions: . sodium chloride 10 mL/hr at 10/14/13 0800  . sodium chloride 10 mL/hr at 10/14/13 0800  . sodium chloride    . furosemide (LASIX) infusion 10 mg/hr (10/14/13 0800)  . lactated ringers 10 mL/hr at 10/14/13 0800  . milrinone 0.25 mcg/kg/min (10/14/13 1052)  . norepinephrine (LEVOPHED) Adult infusion Stopped (10/12/13 0515)    Maureen Chatters, RD, LDN Pager #: [phone] After-Hours Pager #: [phone]

## 2013-10-14 NOTE — Progress Notes (Signed)
eLink Physician-Brief Progress Note Patient Name: Antonio Bernard DOB: 1933-06-04 MRN: [id number]  Date of Service  10/14/2013   HPI/Events of Note  Call from bedside nurse reporting that patient s/p CABG was extubated earlier now with some increased WOB, lower sats in the low 90s and states he feels rough.  Camera check shows some mild distress with increased RR of 33 and sats of 91%.  BP and HR are stable.q   eICU Interventions  Plan: BiPAP for support Continue to monitor patient   Intervention Category Intermediate Interventions: Respiratory distress - evaluation and management  DETERDING,ELIZABETH 10/14/2013, 2:29 AM

## 2013-10-14 NOTE — Progress Notes (Signed)
Placed patient on BIPAP due to increased WOB and decreased Spo2.  Pt. Tolerating  Well at this time.  RN aware.

## 2013-10-14 NOTE — Progress Notes (Addendum)
[facility] DAILY ICU PROGRESS NOTE                   [address]            [place] [id number]          [phone]   6 Days Post-Op Procedure(s) (LRB): CORONARY ARTERY BYPASS GRAFTING (CABG) (N/A) INTRAOPERATIVE TRANSESOPHAGEAL ECHOCARDIOGRAM (N/A)  Total Length of Stay:  LOS: 9 days   Subjective: Awake, alert, getting breathing treatment.  C/o irritation from Foley.   Objective: Vital signs in last 24 hours: Temp:  [97.4 F (36.3 C)-98.4 F (36.9 C)] 98.4 F (36.9 C) (11/10 0728) Pulse Rate:  [73-89] 87 (11/10 0700) Cardiac Rhythm:  [-] Normal sinus rhythm (11/09 2000) Resp:  [16-32] 25 (11/10 0700) BP: (104-135)/(53-79) 135/79 mmHg (11/10 0700) SpO2:  [85 %-97 %] 94 % (11/10 0758) Arterial Line BP: (101-151)/(48-90) 137/74 mmHg (11/10 0700) FiO2 (%):  [40 %] 40 % (11/10 0357) Weight:  [274 lb 7.6 oz (124.5 kg)] 274 lb 7.6 oz (124.5 kg) (11/10 0700)  Filed Weights   10/11/13 0500 10/13/13 0600 10/14/13 0700  Weight: 282 lb 13.6 oz (128.3 kg) 274 lb 14.6 oz (124.7 kg) 274 lb 7.6 oz (124.5 kg)  PRE-OPERATIVE WEIGHT: 118kg   Weight change: -7.1 oz (-0.2 kg)   CBGs 95-96-102-113-118     Intake/Output from previous day: 11/09 0701 - 11/10 0700 In: 1584.8 [I.V.:1284.8; IV Piggyback:300] Out: 1615 [Urine:1510; Stool:105]  Intake/Output this shift:    Current Meds: Scheduled Meds: . aspirin EC  325 mg Oral Daily   Or  . aspirin  324 mg Per Tube Daily  . atorvastatin  80 mg Oral q1800  . bisacodyl  10 mg Oral Daily   Or  . bisacodyl  10 mg Rectal Daily  . budesonide (PULMICORT) nebulizer solution  0.25 mg Nebulization BID  . cefTAZidime (FORTAZ)  IV  1 g Intravenous Q8H  . docusate sodium  200 mg Oral Daily  . enoxaparin (LOVENOX) injection  40 mg Subcutaneous Q24H  . insulin aspart  0-24 Units Subcutaneous Q4H  . levalbuterol  1.25 mg Nebulization Q6H  . metoprolol tartrate  12.5 mg Oral BID   Or  . metoprolol tartrate  12.5 mg Per Tube BID    . pantoprazole (PROTONIX) IV  40 mg Intravenous Q24H  . sodium chloride  10-40 mL Intracatheter Q12H  . sodium chloride  3 mL Intravenous Q12H   Continuous Infusions: . sodium chloride 20 mL/hr at 10/14/13 0600  . sodium chloride 10 mL/hr at 10/14/13 0600  . sodium chloride    . dexmedetomidine Stopped (10/13/13 1725)  . DOPamine 3 mcg/kg/min (10/14/13 0600)  . furosemide (LASIX) infusion 10 mg/hr (10/14/13 0600)  . lactated ringers 10 mL/hr at 10/14/13 0600  . norepinephrine (LEVOPHED) Adult infusion Stopped (10/12/13 0515)  . phenylephrine (NEO-SYNEPHRINE) Adult infusion Stopped (10/08/13 1700)   PRN Meds:.ALPRAZolam, LORazepam, metoprolol, midazolam, morphine injection, ondansetron (ZOFRAN) IV, oxyCODONE, potassium chloride, sodium chloride, sodium chloride   Physical Exam: General appearance: alert, cooperative and no distress Heart: Regular rate and rhythm Lungs: Crackles bilaterally Abdomen: Soft, mildly distended, nontender Extremities: +LE edema Wound: Clean and dry     Lab Results: CBC: Recent Labs  10/13/13 0400 10/13/13 1646 10/14/13 0500  WBC 7.1  --  10.3  HGB 9.1* 8.8* 9.4*  HCT 27.0* 26.0* 28.4*  PLT 248  --  369   BMET:  Recent Labs  10/13/13 0400 10/13/13 1646 10/14/13 0500  NA 141 141 143  K 3.3* 4.0 3.3*  CL 102  --  101  CO2 29  --  25  GLUCOSE 103* 112* 118*  BUN 38*  --  47*  CREATININE 1.06  --  1.15  CALCIUM 7.2*  --  7.7*    PT/INR: No results found for this basename: LABPROT, INR,  in the last 72 hours Radiology: Dg Chest Port 1 View  10/14/2013   CLINICAL DATA:  Pulmonary edema.  EXAM: PORTABLE CHEST - 1 VIEW  COMPARISON:  10/13/2013.  FINDINGS: Interim removal of endotracheal tube and NG tube. Stable positioning of left internal jugular line and right PICC line. Interim progression of bilateral pulmonary infiltrates suggesting pulmonary edema. Cardiomegaly. Prior CABG. Left hemidiaphragm not imaged.  IMPRESSION: 1. Bilateral  pulmonary alveolar infiltrates most consistent with pulmonary edema from congestive heart failure. 2. Interim removal of endotracheal tube and NG tube. IJ catheter and PICC line remain in stable position. 3. Cardiomegaly. Prior CABG.   Electronically Signed   By: Maisie Fus  Register   On: 10/14/2013 07:17   Dg Chest Port 1 View  10/13/2013   CLINICAL DATA:  Shortness of breath, followup pulmonary edema  EXAM: PORTABLE CHEST - 1 VIEW  COMPARISON:  10/12/2013, 10/11/2013, 10/08/2013  FINDINGS: There has been interval removal of the Swan-Ganz catheter. The left IJ sheath remains in place. The right-sided PICC line is in satisfactory position. There is an endotracheal tube with the tip 4.5 cm above the carina. There is a nasogastric tube coursing below the diaphragm with the tip not visualized.  There are low lung volumes. There is no pneumothorax. There is no definite pleural effusion. The left costophrenic angle is excluded from the field of view. There is bibasilar airspace disease likely representing atelectasis. The heart size is mildly enlarged, but stable. Changes secondary to CABG noted.  IMPRESSION: Bibasilar atelectasis.  Stable support apparatus.   Electronically Signed   By: Elige Ko   On: 10/13/2013 08:22   Dg Chest Port 1 View  10/12/2013   CLINICAL DATA:  Post PICC placement  EXAM: PORTABLE CHEST - 1 VIEW  COMPARISON:  DG CHEST 1V PORT dated 10/12/2013  FINDINGS: Interval placement of a right PICC line with tip in the distal SVC. Endotracheal tube, NG tube, and left central venous line are unchanged. Stable enlarged cardiac silhouette. There is bibasilar atelectasis and mild central venous congestion unchanged.  IMPRESSION: 1. Interval placement of right PICC line in good position. 2. Stable support apparatus. 3. Bibasilar atelectasis.   Electronically Signed   By: Genevive Bi M.D.   On: 10/12/2013 11:38     Assessment/Plan: S/P Procedure(s) (LRB): CORONARY ARTERY BYPASS GRAFTING (CABG)  (N/A) INTRAOPERATIVE TRANSESOPHAGEAL ECHOCARDIOGRAM (N/A) CV- Maintaining SR, BPs stable.  Continue current meds. Pulm edema/volume overload- continue diuresis, aggressive pulm toilet. On Lasix, Dopamine drips, UOP good.  Continue Foley for volume measurement. Hypokalemia- replace K+. Expected postop blood loss anemia- H/H stable, improving. Endo-CBGs stable. Mobilize as tolerated.   COLLINS,GINA H 10/14/2013 7:59 AM  Mobilize out of bed Continue Lasix diuresis Check swallow study advanced diet per speech path

## 2013-10-14 NOTE — Progress Notes (Signed)
6 Days Post-Op Procedure(s) (LRB): CORONARY ARTERY BYPASS GRAFTING (CABG) (N/A) INTRAOPERATIVE TRANSESOPHAGEAL ECHOCARDIOGRAM (N/A) Subjective: OOB to chair Maintaining NSR Waiting for swallow eval Objective: Vital signs in last 24 hours: Temp:  [97.6 F (36.4 C)-98.4 F (36.9 C)] 98.2 F (36.8 C) (11/10 1900) Pulse Rate:  [83-94] 88 (11/10 1900) Cardiac Rhythm:  [-] Normal sinus rhythm (11/10 1500) Resp:  [18-36] 21 (11/10 1900) BP: (92-155)/(41-79) 92/45 mmHg (11/10 1900) SpO2:  [85 %-96 %] 90 % (11/10 1937) Arterial Line BP: (101-137)/(48-74) 137/74 mmHg (11/10 0700) FiO2 (%):  [40 %] 40 % (11/10 0357) Weight:  [274 lb 7.6 oz (124.5 kg)] 274 lb 7.6 oz (124.5 kg) (11/10 0700)  Hemodynamic parameters for last 24 hours:   stable Intake/Output from previous day: 11/09 0701 - 11/10 0700 In: 1584.8 [I.V.:1284.8; IV Piggyback:300] Out: 1615 [Urine:1510; Stool:105] Intake/Output this shift:      Lab Results:  Recent Labs  10/13/13 0400 10/13/13 1646 10/14/13 0500  WBC 7.1  --  10.3  HGB 9.1* 8.8* 9.4*  HCT 27.0* 26.0* 28.4*  PLT 248  --  369   BMET:  Recent Labs  10/13/13 0400 10/13/13 1646 10/14/13 0500  NA 141 141 143  K 3.3* 4.0 3.3*  CL 102  --  101  CO2 29  --  25  GLUCOSE 103* 112* 118*  BUN 38*  --  47*  CREATININE 1.06  --  1.15  CALCIUM 7.2*  --  7.7*    PT/INR: No results found for this basename: LABPROT, INR,  in the last 72 hours ABG    Component Value Date/Time   PHART 7.425 10/14/2013 0441   HCO3 25.1* 10/14/2013 0441   TCO2 26.3 10/14/2013 0441   ACIDBASEDEF 2.0 10/08/2013 1524   O2SAT 53.6 10/14/2013 0445   CBG (last 3)   Recent Labs  10/14/13 1144 10/14/13 1616 10/14/13 1913  GLUCAP 131* 129* 122*    Assessment/Plan: S/P Procedure(s) (LRB): CORONARY ARTERY BYPASS GRAFTING (CABG) (N/A) INTRAOPERATIVE TRANSESOPHAGEAL ECHOCARDIOGRAM (N/A) Cont current care   LOS: 9 days    Antonio Bernard,Antonio Bernard 10/14/2013

## 2013-10-14 NOTE — Progress Notes (Signed)
Subjective: Out of bed and in a chair for the first time. His main complaint is SOB and mild incisional pain.   Objective: Vital signs in last 24 hours: Temp:  [97.4 F (36.3 C)-98.4 F (36.9 C)] 98.4 F (36.9 C) (11/10 0728) Pulse Rate:  [78-91] 91 (11/10 0900) Resp:  [17-36] 28 (11/10 0900) BP: (104-135)/(55-79) 132/65 mmHg (11/10 0900) SpO2:  [85 %-96 %] 91 % (11/10 0900) Arterial Line BP: (101-151)/(48-90) 137/74 mmHg (11/10 0700) FiO2 (%):  [40 %] 40 % (11/10 0357) Weight:  [274 lb 7.6 oz (124.5 kg)] 274 lb 7.6 oz (124.5 kg) (11/10 0700) Last BM Date: 10/13/13  Intake/Output from previous day: 11/09 0701 - 11/10 0700 In: 1584.8 [I.V.:1284.8; IV Piggyback:300] Out: 1615 [Urine:1510; Stool:105] Intake/Output this shift: Total I/O In: 200.1 [I.V.:100.1; IV Piggyback:100] Out: 300 [Urine:300]  Medications Current Facility-Administered Medications  Medication Dose Route Frequency Provider Last Rate Last Dose  . 0.45 % sodium chloride infusion   Intravenous Continuous Rowe Clack, PA-C 10 mL/hr at 10/14/13 0800    . 0.9 %  sodium chloride infusion   Intravenous Continuous Rowe Clack, PA-C 10 mL/hr at 10/14/13 0800    . 0.9 %  sodium chloride infusion  250 mL Intravenous Continuous Rowe Clack, PA-C      . ALPRAZolam Prudy Feeler) tablet 0.25 mg  0.25 mg Oral TID PRN Loreli Slot, MD   0.25 mg at 10/14/13 0856  . aspirin EC tablet 325 mg  325 mg Oral Daily Rowe Clack, PA-C   325 mg at 10/09/13 1032   Or  . aspirin chewable tablet 324 mg  324 mg Per Tube Daily Rowe Clack, PA-C   324 mg at 10/14/13 5409  . atorvastatin (LIPITOR) tablet 80 mg  80 mg Oral q1800 Wayne E Gold, PA-C   80 mg at 10/11/13 1830  . bisacodyl (DULCOLAX) EC tablet 10 mg  10 mg Oral Daily Wayne E Gold, PA-C       Or  . bisacodyl (DULCOLAX) suppository 10 mg  10 mg Rectal Daily Rowe Clack, PA-C   10 mg at 10/11/13 0909  . budesonide (PULMICORT) nebulizer solution 0.25 mg  0.25 mg Nebulization  BID Kerin Perna, MD   0.25 mg at 10/13/13 1933  . cefTAZidime (FORTAZ) 1 g in dextrose 5 % 50 mL IVPB  1 g Intravenous Q8H Kerin Perna, MD   1 g at 10/14/13 8119  . dexmedetomidine (PRECEDEX) 400 MCG/100ML infusion  0.4-1.2 mcg/kg/hr Intravenous Titrated Kerin Perna, MD   0.5 mcg/kg/hr at 10/13/13 0826  . docusate sodium (COLACE) capsule 200 mg  200 mg Oral Daily Rowe Clack, PA-C   200 mg at 10/11/13 0909  . DOPamine (INTROPIN) 800 mg in dextrose 5 % 250 mL infusion  0-10 mcg/kg/min Intravenous Continuous Wayne E Gold, PA-C 6.7 mL/hr at 10/14/13 0800 3 mcg/kg/min at 10/14/13 0800  . enoxaparin (LOVENOX) injection 40 mg  40 mg Subcutaneous Q24H Kerin Perna, MD   40 mg at 10/14/13 0930  . furosemide (LASIX) 250 mg in dextrose 5 % 250 mL infusion  10 mg/hr Intravenous Continuous Loreli Slot, MD 10 mL/hr at 10/14/13 0800 10 mg/hr at 10/14/13 0800  . insulin aspart (novoLOG) injection 0-24 Units  0-24 Units Subcutaneous Q4H Kerin Perna, MD   2 Units at 10/12/13 0145  . lactated ringers infusion   Intravenous Continuous Rowe Clack, PA-C 10 mL/hr at 10/14/13 0800    .  levalbuterol (XOPENEX) nebulizer solution 1.25 mg  1.25 mg Nebulization Q6H Kerin Perna, MD   1.25 mg at 10/14/13 0757  . LORazepam (ATIVAN) bolus via infusion 0.5 mg  0.5 mg Intravenous Q4H PRN Loreli Slot, MD      . metoprolol (LOPRESSOR) injection 2.5-5 mg  2.5-5 mg Intravenous Q2H PRN Wayne E Gold, PA-C      . metoprolol tartrate (LOPRESSOR) tablet 12.5 mg  12.5 mg Oral BID Glenice Laine Gold, PA-C   12.5 mg at 10/14/13 1610   Or  . metoprolol tartrate (LOPRESSOR) 25 mg/10 mL oral suspension 12.5 mg  12.5 mg Per Tube BID Rowe Clack, PA-C   12.5 mg at 10/13/13 2236  . midazolam (VERSED) injection 2 mg  2 mg Intravenous Q1H PRN Rowe Clack, PA-C   2 mg at 10/13/13 0541  . morphine 2 MG/ML injection 2-5 mg  2-5 mg Intravenous Q1H PRN Rowe Clack, PA-C   4 mg at 10/13/13 0541  . norepinephrine  (LEVOPHED) 16 mg in dextrose 5 % 250 mL infusion  2-50 mcg/min Intravenous Titrated Kerin Perna, MD   0.25 mcg/min at 10/12/13 0500  . ondansetron (ZOFRAN) injection 4 mg  4 mg Intravenous Q6H PRN Wayne E Gold, PA-C      . oxyCODONE (Oxy IR/ROXICODONE) immediate release tablet 5-10 mg  5-10 mg Oral Q3H PRN Rowe Clack, PA-C   10 mg at 10/14/13 0856  . pantoprazole (PROTONIX) injection 40 mg  40 mg Intravenous Q24H Kerin Perna, MD   40 mg at 10/13/13 2236  . phenylephrine (NEO-SYNEPHRINE) 20,000 mcg in dextrose 5 % 250 mL infusion  0-100 mcg/min Intravenous Continuous Rowe Clack, PA-C   5 mcg/min at 10/08/13 1641  . potassium chloride 10 mEq in 50 mL *CENTRAL LINE* IVPB  10 mEq Intravenous Once Kerin Perna, MD   10 mEq at 10/14/13 0930  . potassium chloride 10 mEq in 50 mL *CENTRAL LINE* IVPB  10 mEq Intravenous Q1H PRN Kerin Perna, MD      . sodium chloride 0.9 % injection 10-40 mL  10-40 mL Intracatheter Q12H Kerin Perna, MD   10 mL at 10/13/13 2236  . sodium chloride 0.9 % injection 10-40 mL  10-40 mL Intracatheter PRN Kerin Perna, MD      . sodium chloride 0.9 % injection 3 mL  3 mL Intravenous Q12H Wayne E Gold, PA-C   3 mL at 10/10/13 2200  . sodium chloride 0.9 % injection 3 mL  3 mL Intravenous PRN Rowe Clack, PA-C      . traMADol Janean Sark) tablet 50 mg  50 mg Oral Q6H PRN Kerin Perna, MD        PE: General appearance: alert, cooperative, no distress and appears mildly dyspnic Lungs: bibasilar crackles Heart: regular rate and rhythm Extremities: 3+ bilateral pitting edema Pulses: 2+ and symmetric Skin: warm and dry Neurologic: Grossly normal  Lab Results:   Recent Labs  10/12/13 0400  10/13/13 0400 10/13/13 1646 10/14/13 0500  WBC 7.1  --  7.1  --  10.3  HGB 9.1*  < > 9.1* 8.8* 9.4*  HCT 26.4*  < > 27.0* 26.0* 28.4*  PLT 199  --  248  --  369  < > = values in this interval not displayed. BMET  Recent Labs  10/12/13 0400  10/13/13 0400  10/13/13 1646 10/14/13 0500  NA 137  < > 141 141 143  K 3.1*  < > 3.3* 4.0 3.3*  CL 99  --  102  --  101  CO2 30  --  29  --  25  GLUCOSE 112*  < > 103* 112* 118*  BUN 32*  --  38*  --  47*  CREATININE 0.99  --  1.06  --  1.15  CALCIUM 7.6*  --  7.2*  --  7.7*  < > = values in this interval not displayed.   Assessment/Plan  Principal Problem:   STEMI of anterior wall,  10/05/13 Active Problems:   Essential hypertension   Obesity (BMI 35.0-39.9 without comorbidity)   S/P urgent LAD PTCA 10/06/13 with IABP for LAD perfusion   Anxiety, claustrophobia   DJD  lumbar- surg August 2013   Sleep apnea- C-pap intol   AAA (abdominal aortic aneurysm)-hx of a "small" AAA found July 2013 (nothing in EPIC)   S/P CABG x 2: LIMA-LAD, SVG-DIAG  Plan: Day 6 s/p coronary artery bypass grafting x2 (left internal mammary artery LAD, saphenous vein graft to diagonal). Gradually improving and looks better. Extubated yesterday. Out of bed and in a chair for the first time. Still short of breath. O2 sats 91% on 5L [place]. Remains volume overloaded with significant peripheral edema on exam. Up +7L.  Continue Lasix dip for diuresis. He is also on Dopamine. Renal function stable. Replete potassium. NSR on telemetry. No post-op arrhythmias noted. Continue on BB, ASA and statin. MD to follow.      LOS: 9 days    Brittainy M. Sharol Harness, PA-C 10/14/2013 10:04 AM  I have seen and examined the patient along with Brittainy M. Sharol Harness, PA-C.  I have reviewed the chart, notes and new data.  I agree with PA's note.  Slow progress, but he has made dramatic improvement compared to just 2-3 days ago. Extubated. Almost off all pressors. Up in chair. Swallow study planned soon. Markedly hypervolemic, receiving diuretics. Considerable risk for AFib.   PLAN: Introduce very low dose beta blocker when completely off pressors.  Thurmon Fair, MD, [facility] [phone] 10/14/2013, 3:12  PM

## 2013-10-15 ENCOUNTER — Inpatient Hospital Stay (HOSPITAL_COMMUNITY): Payer: Medicare Other

## 2013-10-15 DIAGNOSIS — R609 Edema, unspecified: Secondary | ICD-10-CM

## 2013-10-15 DIAGNOSIS — I517 Cardiomegaly: Secondary | ICD-10-CM

## 2013-10-15 LAB — COMPREHENSIVE METABOLIC PANEL
ALT: 32 U/L (ref 0–53)
ALT: 37 U/L (ref 0–53)
AST: 33 U/L (ref 0–37)
AST: 44 U/L — ABNORMAL HIGH (ref 0–37)
Albumin: 2.2 g/dL — ABNORMAL LOW (ref 3.5–5.2)
Albumin: 2.7 g/dL — ABNORMAL LOW (ref 3.5–5.2)
Alkaline Phosphatase: 52 U/L (ref 39–117)
Alkaline Phosphatase: 62 U/L (ref 39–117)
BUN: 51 mg/dL — ABNORMAL HIGH (ref 6–23)
CO2: 22 mEq/L (ref 19–32)
CO2: 26 mEq/L (ref 19–32)
Calcium: 7.6 mg/dL — ABNORMAL LOW (ref 8.4–10.5)
Calcium: 8.4 mg/dL (ref 8.4–10.5)
Chloride: 85 mEq/L — ABNORMAL LOW (ref 96–112)
Chloride: 103 mEq/L (ref 96–112)
Creatinine, Ser: 0.5 mg/dL (ref 0.50–1.35)
Creatinine, Ser: 1.73 mg/dL — ABNORMAL HIGH (ref 0.50–1.35)
GFR calc Af Amer: >90 mL/min (ref >90)
GFR calc non Af Amer: >90 mL/min (ref >90)
GFR calc non Af Amer: 36 mL/min — ABNORMAL LOW (ref >90)
Glucose, Bld: 670 mg/dL (ref 70–99)
Potassium: 2.6 mEq/L — CL (ref 3.5–5.1)
Potassium: 3.1 mEq/L — ABNORMAL LOW (ref 3.5–5.1)
Sodium: 127 mEq/L — ABNORMAL LOW (ref 135–145)
Total Bilirubin: 1.3 mg/dL — ABNORMAL HIGH (ref 0.3–1.2)
Total Bilirubin: 1.5 mg/dL — ABNORMAL HIGH (ref 0.3–1.2)
Total Protein: 4.6 g/dL — ABNORMAL LOW (ref 6.0–8.3)

## 2013-10-15 LAB — BASIC METABOLIC PANEL
BUN: 64 mg/dL — ABNORMAL HIGH (ref 6–23)
CO2: 28 mEq/L (ref 19–32)
Calcium: 8.3 mg/dL — ABNORMAL LOW (ref 8.4–10.5)
Chloride: 104 mEq/L (ref 96–112)
Creatinine, Ser: 1.95 mg/dL — ABNORMAL HIGH (ref 0.50–1.35)
GFR calc Af Amer: 36 mL/min — ABNORMAL LOW (ref >90)
GFR calc non Af Amer: 31 mL/min — ABNORMAL LOW (ref >90)
Glucose, Bld: 125 mg/dL — ABNORMAL HIGH (ref 70–99)
Potassium: 3.2 mEq/L — ABNORMAL LOW (ref 3.5–5.1)
Sodium: 142 mEq/L (ref 135–145)

## 2013-10-15 LAB — GLUCOSE, CAPILLARY
Glucose-Capillary: 119 mg/dL — ABNORMAL HIGH (ref 70–99)
Glucose-Capillary: 120 mg/dL — ABNORMAL HIGH (ref 70–99)
Glucose-Capillary: 123 mg/dL — ABNORMAL HIGH (ref 70–99)
Glucose-Capillary: 135 mg/dL — ABNORMAL HIGH (ref 70–99)

## 2013-10-15 LAB — CBC
HCT: 21.6 % — ABNORMAL LOW (ref 39.0–52.0)
Hemoglobin: 6.9 g/dL — CL (ref 13.0–17.0)
Hemoglobin: 8 g/dL — ABNORMAL LOW (ref 13.0–17.0)
MCH: 30.7 pg (ref 26.0–34.0)
MCH: 30.5 pg (ref 26.0–34.0)
MCHC: 31.9 g/dL (ref 30.0–36.0)
MCV: 96 fL (ref 78.0–100.0)
MCV: 93.1 fL (ref 78.0–100.0)
Platelets: 363 10*3/uL (ref 150–400)
Platelets: 383 10*3/uL (ref 150–400)
RBC: 2.25 MIL/uL — ABNORMAL LOW (ref 4.22–5.81)
RBC: 2.62 MIL/uL — ABNORMAL LOW (ref 4.22–5.81)
RDW: 14.6 % (ref 11.5–15.5)
WBC: 8.9 10*3/uL (ref 4.0–10.5)
WBC: 10.6 10*3/uL — ABNORMAL HIGH (ref 4.0–10.5)

## 2013-10-15 LAB — PREPARE RBC (CROSSMATCH)

## 2013-10-15 LAB — PREALBUMIN: Prealbumin: 12 mg/dL — ABNORMAL LOW (ref 17.0–34.0)

## 2013-10-15 LAB — HEPARIN LEVEL (UNFRACTIONATED): Heparin Unfractionated: 0.35 IU/mL (ref 0.30–0.70)

## 2013-10-15 LAB — CARBOXYHEMOGLOBIN
Carboxyhemoglobin: 1.3 % (ref 0.5–1.5)
Methemoglobin: 0.8 % (ref 0.0–1.5)
O2 Saturation: 59.4 %
Total hemoglobin: 8.1 g/dL — ABNORMAL LOW (ref 13.5–18.0)

## 2013-10-15 MED ORDER — FUROSEMIDE 10 MG/ML IJ SOLN
40.0000 mg | Freq: Once | INTRAMUSCULAR | Status: AC
  Administered 2013-10-15: 40 mg via INTRAVENOUS
  Filled 2013-10-15: qty 4

## 2013-10-15 MED ORDER — POTASSIUM CHLORIDE 10 MEQ/50ML IV SOLN
10.0000 meq | INTRAVENOUS | Status: DC | PRN
Start: 2013-10-15 — End: 2013-10-15

## 2013-10-15 MED ORDER — AMIODARONE HCL IN DEXTROSE 360-4.14 MG/200ML-% IV SOLN
30.0000 mg/h | INTRAVENOUS | Status: DC
  Administered 2013-10-15 (×3): 30 mg/h via INTRAVENOUS
  Filled 2013-10-15 (×8): qty 200

## 2013-10-15 MED ORDER — ALBUMIN HUMAN 5 % IV SOLN
12.5000 g | Freq: Once | INTRAVENOUS | Status: AC
  Administered 2013-10-15: 12.5 g via INTRAVENOUS

## 2013-10-15 MED ORDER — PHENYLEPHRINE HCL 10 MG/ML IJ SOLN
0.0000 ug/min | INTRAVENOUS | Status: DC
  Administered 2013-10-15: 15 ug/min via INTRAVENOUS
  Administered 2013-10-15 (×2): 25 ug/min via INTRAVENOUS
  Filled 2013-10-15 (×2): qty 1

## 2013-10-15 MED ORDER — RESOURCE THICKENUP CLEAR PO POWD
ORAL | Status: DC | PRN
  Filled 2013-10-15: qty 125

## 2013-10-15 MED ORDER — AMIODARONE HCL IN DEXTROSE 360-4.14 MG/200ML-% IV SOLN
60.0000 mg/h | INTRAVENOUS | Status: AC
  Administered 2013-10-15 (×2): 60 mg/h via INTRAVENOUS
  Filled 2013-10-15 (×2): qty 200

## 2013-10-15 MED ORDER — ENOXAPARIN SODIUM 40 MG/0.4ML ~~LOC~~ SOLN
40.0000 mg | SUBCUTANEOUS | Status: DC
  Administered 2013-10-15: 40 mg via SUBCUTANEOUS
  Filled 2013-10-15: qty 0.4

## 2013-10-15 MED ORDER — STARCH (THICKENING) PO POWD: ORAL | Status: DC | PRN

## 2013-10-15 MED ORDER — AMIODARONE LOAD VIA INFUSION
150.0000 mg | Freq: Once | INTRAVENOUS | Status: AC
  Administered 2013-10-15: 150 mg via INTRAVENOUS
  Filled 2013-10-15: qty 83.34

## 2013-10-15 MED ORDER — PHENYLEPHRINE HCL 10 MG/ML IJ SOLN
0.0000 ug/min | INTRAVENOUS | Status: DC
  Administered 2013-10-15: 20 ug/min via INTRAVENOUS
  Filled 2013-10-15 (×2): qty 1

## 2013-10-15 MED ORDER — FUROSEMIDE 10 MG/ML IJ SOLN
40.0000 mg | Freq: Two times a day (BID) | INTRAMUSCULAR | Status: DC
  Administered 2013-10-15 (×2): 40 mg via INTRAVENOUS
  Filled 2013-10-15 (×4): qty 4

## 2013-10-15 MED ORDER — HEPARIN (PORCINE) IN NACL 100-0.45 UNIT/ML-% IJ SOLN
1450.0000 [IU]/h | INTRAMUSCULAR | Status: AC
  Administered 2013-10-15 – 2013-10-16 (×2): 1450 [IU]/h via INTRAVENOUS
  Filled 2013-10-15 (×4): qty 250

## 2013-10-15 MED ORDER — POTASSIUM CHLORIDE 10 MEQ/50ML IV SOLN
10.0000 meq | INTRAVENOUS | Status: AC | PRN
  Administered 2013-10-15 (×3): 10 meq via INTRAVENOUS
  Filled 2013-10-15 (×3): qty 50

## 2013-10-15 MED ORDER — HEPARIN BOLUS VIA INFUSION
5000.0000 [IU] | Freq: Once | INTRAVENOUS | Status: AC
  Administered 2013-10-15: 5000 [IU] via INTRAVENOUS
  Filled 2013-10-15: qty 5000

## 2013-10-15 MED ORDER — CALCIUM CHLORIDE 10 % IV SOLN
1.0000 g | Freq: Once | INTRAVENOUS | Status: AC
  Administered 2013-10-15: 1 g via INTRAVENOUS
  Filled 2013-10-15: qty 10

## 2013-10-15 MED ORDER — ALBUMIN HUMAN 5 % IV SOLN
INTRAVENOUS | Status: AC
  Filled 2013-10-15: qty 250

## 2013-10-15 NOTE — Progress Notes (Signed)
ANTICOAGULATION CONSULT NOTE - Initial Consult  Pharmacy Consult for Heparin Indication: DVT  Allergies  Allergen Reactions  . Demerol [Meperidine] Other (See Comments)    "makes me crazy"  . Lisinopril Diarrhea  . Shellfish Allergy Rash    Patient Measurements: Height: 6\' 1"  (185.4 cm) Weight: 272 lb 14.9 oz (123.8 kg) IBW/kg (Calculated) : 79.9 Heparin Dosing Weight: 107 kg  Vital Signs: Temp: 98.2 F (36.8 C) (11/11 1205) Temp src: Oral (11/11 1205) BP: 116/62 mmHg (11/11 1200) Pulse Rate: 87 (11/11 1200)  Labs:  Recent Labs  10/14/13 0500 10/15/13 0100 10/15/13 0104  HGB 9.4* 8.0* 6.9*  HCT 28.4* 24.4* 21.6*  PLT 369 383 363  CREATININE 1.15 1.73* 0.50    Estimated Creatinine Clearance: 106.6 ml/min (by C-G formula based on Cr of 0.5).   Medical History: Past Medical History  Diagnosis Date  . Acid reflux   . Family history of anesthesia complication     daughter had problem waking up  . Pneumonia     hx of  . Sleep apnea     no treatment for, last sleep study over 5 years ago  . Abdominal aortic aneurysm     found 06/29/12 "small"  . Headache(784.0)   . Herniated vertebral disc     thoracic area  . Cataracts, bilateral     hx of  . H/O Legionnaire's disease     about 24 years ago  . Anxiety     panic attacks   . Claustrophobia     "severe"  . Hypertension     sees  Dr. Ronne Binning [phone]  . Obesity (BMI 35.0-39.9 without comorbidity) 10/06/2013  . Essential hypertension 10/06/2013  . Anginal pain     Assessment: 53 YOM admitted on 11/1 with STEMI, s/p urgent cath, and CABG on 11/4, has been on lovenox 40 mg sq q 24 hrs for VTE px, now with new R- peroneal DVT, pharmacy is consulted to start IV heparin. Hgb low at 6.9, plt 363, previously heparin level 0.2 on 1300 units/hr.   Goal of Therapy:  Heparin level 0.3-0.7 units/ml Monitor platelets by anticoagulation protocol: Yes   Plan:  - heparin bolus 5000 units/hr - heparin infusion  1450 units/hr - f/u 8 hr heparin level at 2030 - daily heparin level and CBC - monitor cbc closely  Bayard Hugger, PharmD, BCPS  Clinical Pharmacist  Pager: [phone]   10/15/2013,2:03 PM

## 2013-10-15 NOTE — Progress Notes (Signed)
CRITICAL VALUE ALERT  Critical value received:  Glucose 670 and K+ 2.6  Date of notification:  10/15/13  Time of notification:  0154  Critical value read back:yes  Nurse who received alert:  Okey Dupre   Primary RN, Anthoney Harada made aware of results.  CBG done at bedside with result of 120.  Labs resent to verify results.

## 2013-10-15 NOTE — Progress Notes (Signed)
Pt tolerated Neb tx well, O2 sats maintained around 94% on treatment only. Pt then placed on 50%VM, and O2 sats remained around 90-91%. Pt placed on Partial rebreather and O2 sats are holding around 97%. Plan to wean O2 as tolerated. RT will continue to monitor.

## 2013-10-15 NOTE — Plan of Care (Signed)
Problem: Phase II Progression Outcomes Goal: Tolerates liquids without nausea/vomiting Outcome: Progressing DYS 3 diet

## 2013-10-15 NOTE — Progress Notes (Signed)
  Echocardiogram 2D Echocardiogram has been performed.  Antonio Bernard 10/15/2013, 2:36 PM

## 2013-10-15 NOTE — Evaluation (Signed)
Clinical/Bedside Swallow Evaluation Patient Details  Name: Antonio Bernard MRN: [id number] Date of Birth: 07-13-1933  Today's Date: 10/15/2013 Time: 4098-1191 SLP Time Calculation (min): 35 min  Past Medical History:  Past Medical History  Diagnosis Date  . Acid reflux   . Family history of anesthesia complication     daughter had problem waking up  . Pneumonia     hx of  . Sleep apnea     no treatment for, last sleep study over 5 years ago  . Abdominal aortic aneurysm     found 06/29/12 "small"  . Headache(784.0)   . Herniated vertebral disc     thoracic area  . Cataracts, bilateral     hx of  . H/O Legionnaire's disease     about 24 years ago  . Anxiety     panic attacks   . Claustrophobia     "severe"  . Hypertension     sees  Dr. Ronne Binning [phone]  . Obesity (BMI 35.0-39.9 without comorbidity) 10/06/2013  . Essential hypertension 10/06/2013  . Anginal pain    Past Surgical History:  Past Surgical History  Procedure Laterality Date  . Shoulder surgery      left  . Cataract extraction w/ intraocular lens implant      bilateral  . Lumbar laminectomy/decompression microdiscectomy  07/20/2012    Procedure: LUMBAR LAMINECTOMY/DECOMPRESSION MICRODISCECTOMY 1 LEVEL;  Surgeon: Reinaldo Meeker, MD;  Location: [facility];  Service: Neurosurgery;  Laterality: N/A;  lumbar four-five left  . Back surgery    . Cardiac catheterization  10/05/2013  . Eye surgery    . Coronary artery bypass graft N/A 10/08/2013    Procedure: CORONARY ARTERY BYPASS GRAFTING (CABG);  Surgeon: Kerin Perna, MD;  Location: [facility] OR;  Service: Open Heart Surgery;  Laterality: N/A;  Times 2 using left internal mammary artery and endoscopically harvested left saphenous vein  . Intraoperative transesophageal echocardiogram N/A 10/08/2013    Procedure: INTRAOPERATIVE TRANSESOPHAGEAL ECHOCARDIOGRAM;  Surgeon: Kerin Perna, MD;  Location: [facility] OR;  Service: Open Heart Surgery;  Laterality: N/A;    HPI:  47 yom admitted through ED on 11/1 with dx of anterior STEMI; cardiac cath on 11/2; CABG 11/4; intubated 11/4 - 11/9.  Non-rebreather today.    Assessment / Plan / Recommendation Clinical Impression  Pt presents with an acute reversible dysphagia related primarily to respiratory status s/p surgery, intubation. Demonstrates symptoms of dysphagia marked by reversed swallow-respiratory pattern with inconsistent inhalation post-swallow and RR intermittently hovering in high 20s/low 30s.  Deficits most prevalent with thin liquids.   Pt's vocal quality and MS bode well for improved function.   For now, recommend initiating a dysphagia 3 diet with nectar-thick liquids.  Give meds whole in puree.  SLP to follow briefly for toleration and diet advancement.  Pt/dtr in agreement with plan.    Aspiration Risk  Mild    Diet Recommendation Dysphagia 3 (Mechanical Soft);Nectar-thick liquid   Liquid Administration via: Cup;Straw Medication Administration: Whole meds with puree Supervision: Intermittent supervision to cue for compensatory strategies Compensations: Slow rate;Small sips/bites;Clear throat intermittently Postural Changes and/or Swallow Maneuvers: Seated upright 90 degrees;Upright 30-60 min after meal    Other  Recommendations Oral Care Recommendations: Oral care BID Other Recommendations: Order thickener from pharmacy   Follow Up Recommendations   (tba)    Frequency and Duration min 2x/week  2 weeks       SLP Swallow Goals  see care plan   Swallow Study Prior  Functional Status       General Date of Onset: 10/05/13 HPI: 20 yom admitted through ED on 11/1 with dx of anterior STEMI; cardiac cath on 11/2; CABG 11/4; intubated 11/4 - 11/9.  Type of Study: Bedside swallow evaluation Diet Prior to this Study: NPO Temperature Spikes Noted: No Respiratory Status: non-rebreather (Sp02 at 97%) History of Recent Intubation: Yes Length of Intubations (days): 5 days Date  extubated: 10/13/13 Behavior/Cognition: Alert;Cooperative Oral Cavity - Dentition: Dentures, top;Dentures, bottom Self-Feeding Abilities: Able to feed self Patient Positioning: Upright in bed Baseline Vocal Quality: Clear Volitional Cough: Weak Volitional Swallow: Able to elicit    Oral/Motor/Sensory Function Overall Oral Motor/Sensory Function: Appears within functional limits for tasks assessed   Ice Chips Ice chips: Within functional limits Presentation: Spoon   Thin Liquid Thin Liquid: Impaired Presentation: Cup;Straw Pharyngeal  Phase Impairments: Multiple swallows;Cough - Delayed;Change in Vital Signs (RR increasing to 30; inspiration post-swallow)    Nectar Thick Nectar Thick Liquid: Impaired Presentation: Cup;Straw Pharyngeal Phase Impairments: Multiple swallows   Honey Thick Honey Thick Liquid: Not tested   Puree Puree: Within functional limits   Solid  Antonio Bernard, [place] CCC/SLP Pager [phone]     Solid: Within functional limits       Antonio Bernard 10/15/2013,1:11 PM

## 2013-10-15 NOTE — Progress Notes (Signed)
PT Cancellation Note  Patient Details Name: Antonio Bernard MRN: [id number] DOB: 1933-09-24   Cancelled Treatment:    Reason Eval/Treat Not Completed: Medical issues which prohibited therapy. Pt with decr responsiveness on BSC with nursing earlier.  Will check back later this PM.   Santasia Rew 10/15/2013, 10:39 AM

## 2013-10-15 NOTE — Progress Notes (Signed)
RN called RT and stated that patient had taken off his BiPAP mask.  Patient stated "he would rather die than wear that mask".  Was placed on 50% Venti mask and sats were 90%.  Placed on NRB, sats 95%.  RT will continue to monitor.

## 2013-10-15 NOTE — Progress Notes (Signed)
PT Cancellation Note  Patient Details Name: Antonio Bernard MRN: [id number] DOB: 10/25/1933   Cancelled Treatment:    Reason Eval/Treat Not Completed: Medical issues which prohibited therapy (new DVT).  Will attempt again tomorrow.   [name] 10/15/2013, 3:10 PM

## 2013-10-15 NOTE — Progress Notes (Signed)
CRITICAL VALUE ALERT  Critical value received:  Hgb 6.9  Date of notification:  10/15/2013  Time of notification:  0129  Critical value read back:yes  Nurse who received alert:  Anthoney Harada, RN  MD notified (1st page):  Dr. Donata Clay  Time of first page:  0133  Time MD responded:  0134

## 2013-10-15 NOTE — Progress Notes (Signed)
7 Days Post-Op Procedure(s) (LRB): CORONARY ARTERY BYPASS GRAFTING (CABG) (N/A) INTRAOPERATIVE TRANSESOPHAGEAL ECHOCARDIOGRAM (N/A) Subjective: Mental status, anxiety well controlled on xanax Transient afib yesterday assoc with lower urine output , related to diastolic LV dysfunction post hemorrhagic MI CXR clearing edema, CVP 12, co-ox 54 on low dose mil, renal dopa Will start po diet, meds after SLT eval Check postop echo today  Objective: Vital signs in last 24 hours: Temp:  [97.6 F (36.4 C)-99 F (37.2 C)] 97.8 F (36.6 C) (11/11 0745) Pulse Rate:  [84-141] 84 (11/11 0745) Cardiac Rhythm:  [-] Normal sinus rhythm (11/11 0600) Resp:  [14-28] 23 (11/11 0745) BP: (86-155)/(40-67) 107/55 mmHg (11/11 0745) SpO2:  [90 %-100 %] 98 % (11/11 0751) FiO2 (%):  [50 %-100 %] 100 % (11/11 0400) Weight:  [272 lb 14.9 oz (123.8 kg)] 272 lb 14.9 oz (123.8 kg) (11/11 0500)  Hemodynamic parameters for last 24 hours: CVP:  [9 mmHg-14 mmHg] 14 mmHg  Intake/Output from previous day: 11/10 0701 - 11/11 0700 In: 2335.9 [I.V.:1573.4; Blood:12.5; IV Piggyback:750] Out: 729 [Urine:729] Intake/Output this shift: Total I/O In: 150 [Blood:150] Out: -   Alert and comfortable Lungs clear extrem warm w/ mod edema  Lab Results:  Recent Labs  10/15/13 0100 10/15/13 0104  WBC 10.6* 8.9  HGB 8.0* 6.9*  HCT 24.4* 21.6*  PLT 383 363   BMET:  Recent Labs  10/15/13 0100 10/15/13 0104  NA 142 127*  K 3.1* 2.6*  CL 103 85*  CO2 26 22  GLUCOSE 123* 670*  BUN 59* 51*  CREATININE 1.73* 0.50  CALCIUM 8.4 7.6*    PT/INR: No results found for this basename: LABPROT, INR,  in the last 72 hours ABG    Component Value Date/Time   PHART 7.425 10/14/2013 0441   HCO3 25.1* 10/14/2013 0441   TCO2 26.3 10/14/2013 0441   ACIDBASEDEF 2.0 10/08/2013 1524   O2SAT 59.4 10/15/2013 0440   CBG (last 3)   Recent Labs  10/14/13 1913 10/14/13 2340 10/15/13 0159  GLUCAP 122* 135* 120*     Assessment/Plan: S/P Procedure(s) (LRB): CORONARY ARTERY BYPASS GRAFTING (CABG) (N/A) INTRAOPERATIVE TRANSESOPHAGEAL ECHOCARDIOGRAM (N/A) Wean O2 OOB, PT    start diet per SLT Scan legs for DVT with low sats and clearing edema-cont lovenox Follow renal fx, cont renal dopa   LOS: 10 days    VAN TRIGT III,PETER 10/15/2013

## 2013-10-15 NOTE — Progress Notes (Signed)
Dr. Alla German was paged to inform him of the positive DVT result in the R peroneal. The page was not returned.

## 2013-10-15 NOTE — Progress Notes (Addendum)
ANTICOAGULATION CONSULT NOTE - Follow Up Consult  Pharmacy Consult for Heparin  Indication: DVT, right peroneal   Allergies  Allergen Reactions  . Demerol [Meperidine] Other (See Comments)    "makes me crazy"  . Lisinopril Diarrhea  . Shellfish Allergy Rash    Patient Measurements: Height: 6\' 1"  (185.4 cm) Weight: 272 lb 14.9 oz (123.8 kg) IBW/kg (Calculated) : 79.9 Heparin Dosing Weight: ~107 kg  Vital Signs: Temp: 98 F (36.7 C) (11/11 2337) Temp src: Oral (11/11 2337) BP: 111/55 mmHg (11/11 2300) Pulse Rate: 87 (11/11 2300)  Labs:  Recent Labs  10/14/13 0500 10/15/13 0100 10/15/13 0104 10/15/13 1500 10/15/13 2030  HGB 9.4* 8.0* 6.9*  --   --   HCT 28.4* 24.4* 21.6*  --   --   PLT 369 383 363  --   --   HEPARINUNFRC  --   --   --   --  0.35  CREATININE 1.15 1.73* 0.50 1.95*  --     Estimated Creatinine Clearance: 43.8 ml/min (by C-G formula based on Cr of 1.95).   Medications:  Heparin 1450 units/hr  Assessment: 79 y/o M on heparin for new DVT. Noted last Hgb low at 6.9 (getting 1 unit PRBC), HL is 0.35, other labs as above.   Goal of Therapy:  Heparin level 0.3-0.7 units/ml Monitor platelets by anticoagulation protocol: Yes   Plan:  -Continue heparin at 1450 units/hr -F/U HL with AM labs  -Daily CBC/HL -Monitor for bleeding given low Hgb, f/u post-transfusion Hgb  Thank you for allowing me to take part in this patient's care,  Abran Duke, PharmD Clinical Pharmacist Phone: [phone] Pager: [phone] 10/15/2013 11:44 PM  Addendum HL 0.43 Continue current rate Daily CBC/HL  Wilmer Floor, PharmD

## 2013-10-15 NOTE — Progress Notes (Signed)
Patient placed on BiPAP due to sats of 90% on a 50% venti mask. Will continue to wear BiPAP throughout the night.  RT will continue to monitor.

## 2013-10-15 NOTE — Progress Notes (Signed)
Bilateral lower extremity venous duplex completed.  Right:  DVT noted in the peroneal vein.  No evidence of superficial thrombosis.  No Baker's cyst.  Left:  No evidence of DVT, superficial thrombosis, or Baker's cyst.   

## 2013-10-15 NOTE — Progress Notes (Signed)
The patient requested to get up to the bedside commode at 8:10.  Myself and another nurse stood the patient up, he pivoted and told us that he was about to "go down".  We sat the patient on the bedside commode and at that time the patient became unresponsive.  We called for help and assistance came to get the patient back to bed.  We requested a pad for the MaxiSky to get the patient back to bed.  As the nurse tech was retrieving the lift equipment the patient became responsive again.  After giving the patient a few minutes to recover, reattaching the temporary epicardial pacemaker and putting the valve back on the partial rebreather to increase the oxygen available to the patient he was able to stand on his own and get back into the bed.  The pacemaker was set to a rate of 88 by Dr. Alla German, initially the patient had an intrinsic rhythm of 90.  The patient's blood pressure was taken and recorded as 105/49 with a MAP of 63. After returning the patient to bed he stated he was again comfortable.  Will continue to monitor the patient closely.

## 2013-10-15 NOTE — Progress Notes (Signed)
Subjective: Only complaint is a dry mouth. Requesting ice chips. Daughter is by his bedside.   Objective: Vital signs in last 24 hours: Temp:  [97.6 F (36.4 C)-99 F (37.2 C)] 98.2 F (36.8 C) (11/11 1205) Pulse Rate:  [83-141] 87 (11/11 1200) Resp:  [14-29] 26 (11/11 1200) BP: (86-129)/(40-72) 116/62 mmHg (11/11 1200) SpO2:  [85 %-100 %] 96 % (11/11 1200) FiO2 (%):  [50 %-100 %] 60 % (11/11 1200) Weight:  [272 lb 14.9 oz (123.8 kg)] 272 lb 14.9 oz (123.8 kg) (11/11 0500) Last BM Date: 10/15/13  Intake/Output from previous day: 11/10 0701 - 11/11 0700 In: 2447.1 [I.V.:1684.6; Blood:12.5; IV Piggyback:750] Out: 729 [Urine:729] Intake/Output this shift: Total I/O In: 797.5 [I.V.:597.5; Blood:150; IV Piggyback:50] Out: 122 [Urine:122]  Medications Current Facility-Administered Medications  Medication Dose Route Frequency Provider Last Rate Last Dose  . 0.9 %  sodium chloride infusion   Intravenous Continuous Kerin Perna, MD 50 mL/hr at 10/15/13 1200    . ALPRAZolam Prudy Feeler) tablet 0.5 mg  0.5 mg Oral TID PRN Kerin Perna, MD   0.5 mg at 10/15/13 0505  . amiodarone (NEXTERONE PREMIX) 360 mg/200 mL dextrose IV infusion  30 mg/hr Intravenous Continuous Kerin Perna, MD 16.7 mL/hr at 10/15/13 1200 30 mg/hr at 10/15/13 1200  . aspirin EC tablet 325 mg  325 mg Oral Daily Rowe Clack, PA-C   325 mg at 10/15/13 9562   Or  . aspirin chewable tablet 324 mg  324 mg Per Tube Daily Rowe Clack, PA-C   324 mg at 10/14/13 1308  . atorvastatin (LIPITOR) tablet 80 mg  80 mg Oral q1800 Wayne E Gold, PA-C   80 mg at 10/14/13 1717  . bisacodyl (DULCOLAX) EC tablet 10 mg  10 mg Oral Daily Wayne E Gold, PA-C       Or  . bisacodyl (DULCOLAX) suppository 10 mg  10 mg Rectal Daily Rowe Clack, PA-C   10 mg at 10/11/13 0909  . budesonide (PULMICORT) nebulizer solution 0.25 mg  0.25 mg Nebulization BID Kerin Perna, MD   0.25 mg at 10/15/13 0748  . cefTAZidime (FORTAZ) 1 g in dextrose  5 % 50 mL IVPB  1 g Intravenous Q8H Kerin Perna, MD   1 g at 10/15/13 0949  . docusate sodium (COLACE) capsule 200 mg  200 mg Oral Daily Rowe Clack, PA-C   200 mg at 10/11/13 0909  . DOPamine (INTROPIN) 800 mg in dextrose 5 % 250 mL infusion  2.5 mcg/kg/min Intravenous Titrated Kerin Perna, MD 5.8 mL/hr at 10/15/13 1200 2.5 mcg/kg/min at 10/15/13 1200  . enoxaparin (LOVENOX) injection 40 mg  40 mg Subcutaneous Q24H Kerin Perna, MD   40 mg at 10/15/13 0950  . furosemide (LASIX) injection 40 mg  40 mg Intravenous BID Kerin Perna, MD   40 mg at 10/15/13 0949  . insulin aspart (novoLOG) injection 0-24 Units  0-24 Units Subcutaneous Q4H Kerin Perna, MD   2 Units at 10/15/13 1245  . levalbuterol (XOPENEX) nebulizer solution 1.25 mg  1.25 mg Nebulization Q6H Kerin Perna, MD   1.25 mg at 10/15/13 0748  . metoprolol (LOPRESSOR) injection 2.5-5 mg  2.5-5 mg Intravenous Q2H PRN Wayne E Gold, PA-C      . metoprolol tartrate (LOPRESSOR) tablet 12.5 mg  12.5 mg Oral BID Wayne E Gold, PA-C   12.5 mg at 10/14/13 6578   Or  . metoprolol tartrate (  LOPRESSOR) 25 mg/10 mL oral suspension 12.5 mg  12.5 mg Per Tube BID Rowe Clack, PA-C   12.5 mg at 10/13/13 2236  . milrinone (PRIMACOR) infusion 200 mcg/mL (0.2 mg/ml)  0.2 mcg/kg/min Intravenous Continuous Kerin Perna, MD 7.5 mL/hr at 10/15/13 1200 0.2 mcg/kg/min at 10/15/13 1200  . ondansetron (ZOFRAN) injection 4 mg  4 mg Intravenous Q6H PRN Wayne E Gold, PA-C      . oxyCODONE (Oxy IR/ROXICODONE) immediate release tablet 5-10 mg  5-10 mg Oral Q3H PRN Rowe Clack, PA-C   5 mg at 10/14/13 2042  . pantoprazole (PROTONIX) injection 40 mg  40 mg Intravenous Q24H Kerin Perna, MD   40 mg at 10/14/13 2147  . phenylephrine (NEO-SYNEPHRINE) 10 mg in dextrose 5 % 250 mL infusion  0-30 mcg/min Intravenous Titrated Kerin Perna, MD 37.5 mL/hr at 10/15/13 1200 25 mcg/min at 10/15/13 1200  . sodium chloride 0.9 % injection 10-40 mL  10-40 mL  Intracatheter PRN Kerin Perna, MD      . sodium chloride 0.9 % injection 3 mL  3 mL Intravenous Q12H Wayne E Gold, PA-C   3 mL at 10/15/13 0951  . sodium chloride 0.9 % injection 3 mL  3 mL Intravenous PRN Rowe Clack, PA-C      . traMADol Janean Sark) tablet 50 mg  50 mg Oral Q6H PRN Kerin Perna, MD   50 mg at 10/14/13 1231    PE: General appearance: alert, cooperative and no distress Lungs: bibasilar crackles Heart: regular rate and rhythm Extremities: bilateral LEE Pulses: 2+ and symmetric Skin: warm and dry Neurologic: Grossly normal  Lab Results:   Recent Labs  10/14/13 0500 10/15/13 0100 10/15/13 0104  WBC 10.3 10.6* 8.9  HGB 9.4* 8.0* 6.9*  HCT 28.4* 24.4* 21.6*  PLT 369 383 363   BMET  Recent Labs  10/14/13 0500 10/15/13 0100 10/15/13 0104  NA 143 142 127*  K 3.3* 3.1* 2.6*  CL 101 103 85*  CO2 25 26 22   GLUCOSE 118* 123* 670*  BUN 47* 59* 51*  CREATININE 1.15 1.73* 0.50  CALCIUM 7.7* 8.4 7.6*   Assessment/Plan  Principal Problem:   STEMI of anterior wall,  10/05/13 Active Problems:   Essential hypertension   Obesity (BMI 35.0-39.9 without comorbidity)   S/P urgent LAD PTCA 10/06/13 with IABP for LAD perfusion   Anxiety, claustrophobia   DJD  lumbar- surg August 2013   Sleep apnea- C-pap intol   AAA (abdominal aortic aneurysm)-hx of a "small" AAA found July 2013 (nothing in EPIC)   S/P CABG x 2: LIMA-LAD, SVG-DIAG  Plan: Day 6 s/p coronary artery bypass grafting x2 (left internal mammary artery LAD, saphenous vein graft to diagonal). NOTE: Discussed with RN, the second set of labs from today in patients chart are erroneous. Please refer to labs reported at 0100. K+ 3.1 and Hgb 8.0. Potassium was repleted earlier. Continue IV Lasix for diuresis. Continue daily BMP to assess electrolytes. Continue supplementing potassium loses. He is maintaining NSR on telemetry. Continue Amiodarone. Start low dose BB once completely off all pressors. Pt apparently  now has a lower extremity DVT. CTTS has ordered heparin per pharmacy. We will continue to follow along. MD to follow.       LOS: 10 days    Brittainy M. Delmer Islam 10/15/2013 1:03 PM  Agree with note written by Boyce Medici  PAC  POD # 6 STEMI/POBA, IABP and urgent CABG X2. Slow recovery.  EF 30-35%. On mult pressors including Dop, Milrinone and neo. On IV hep secondary to DVT. NSR on IV amio. Pacing. Tx 1 Unit PRBCs. UOP decreasing and Scr increasing. CVP 14 this am. Condition still tenuous. No new recs. Cont IV lasix.  Runell Gess 10/15/2013 4:25 PM

## 2013-10-15 NOTE — Progress Notes (Signed)
[facility] BRIEF SICU PROGRESS NOTE  7 Days Post-Op  S/P Procedure(s) (LRB): CORONARY ARTERY BYPASS GRAFTING (CABG) (N/A) INTRAOPERATIVE TRANSESOPHAGEAL ECHOCARDIOGRAM (N/A)   AAI paced BP stable UOP marginal Creatinine up to 1.9  Plan: Continue milrinone, renal dopamine Will transfuse 1 additional unit PRBCs  Chrisy Hillebrand H 10/15/2013 8:01 PM

## 2013-10-16 ENCOUNTER — Inpatient Hospital Stay (HOSPITAL_COMMUNITY): Payer: Medicare Other

## 2013-10-16 DIAGNOSIS — N179 Acute kidney failure, unspecified: Secondary | ICD-10-CM

## 2013-10-16 DIAGNOSIS — J9600 Acute respiratory failure, unspecified whether with hypoxia or hypercapnia: Secondary | ICD-10-CM

## 2013-10-16 DIAGNOSIS — I5021 Acute systolic (congestive) heart failure: Secondary | ICD-10-CM

## 2013-10-16 LAB — CBC
HCT: 28.7 % — ABNORMAL LOW (ref 39.0–52.0)
Hemoglobin: 9.5 g/dL — ABNORMAL LOW (ref 13.0–17.0)
MCH: 30.2 pg (ref 26.0–34.0)
MCHC: 33.1 g/dL (ref 30.0–36.0)
MCV: 91.1 fL (ref 78.0–100.0)
Platelets: 461 10*3/uL — ABNORMAL HIGH (ref 150–400)
RBC: 3.15 MIL/uL — ABNORMAL LOW (ref 4.22–5.81)
RDW: 15.6 % — ABNORMAL HIGH (ref 11.5–15.5)
WBC: 11.3 10*3/uL — ABNORMAL HIGH (ref 4.0–10.5)

## 2013-10-16 LAB — COMPREHENSIVE METABOLIC PANEL
ALT: 47 U/L (ref 0–53)
AST: 44 U/L — ABNORMAL HIGH (ref 0–37)
Albumin: 2.8 g/dL — ABNORMAL LOW (ref 3.5–5.2)
Alkaline Phosphatase: 70 U/L (ref 39–117)
BUN: 68 mg/dL — ABNORMAL HIGH (ref 6–23)
CO2: 26 mEq/L (ref 19–32)
Calcium: 8.1 mg/dL — ABNORMAL LOW (ref 8.4–10.5)
Chloride: 103 mEq/L (ref 96–112)
Creatinine, Ser: 2.07 mg/dL — ABNORMAL HIGH (ref 0.50–1.35)
GFR calc Af Amer: 34 mL/min — ABNORMAL LOW (ref >90)
GFR calc non Af Amer: 29 mL/min — ABNORMAL LOW (ref >90)
Glucose, Bld: 126 mg/dL — ABNORMAL HIGH (ref 70–99)
Potassium: 3.2 mEq/L — ABNORMAL LOW (ref 3.5–5.1)
Sodium: 143 mEq/L (ref 135–145)
Total Bilirubin: 1.7 mg/dL — ABNORMAL HIGH (ref 0.3–1.2)
Total Protein: 5.8 g/dL — ABNORMAL LOW (ref 6.0–8.3)

## 2013-10-16 LAB — TYPE AND SCREEN
ABO/RH(D): A POS
Antibody Screen: NEGATIVE
Unit division: 0
Unit division: 0

## 2013-10-16 LAB — URINE CULTURE
Culture: NO GROWTH
Special Requests: NORMAL

## 2013-10-16 LAB — GLUCOSE, CAPILLARY
Glucose-Capillary: 109 mg/dL — ABNORMAL HIGH (ref 70–99)
Glucose-Capillary: 110 mg/dL — ABNORMAL HIGH (ref 70–99)
Glucose-Capillary: 116 mg/dL — ABNORMAL HIGH (ref 70–99)

## 2013-10-16 LAB — BASIC METABOLIC PANEL
BUN: 69 mg/dL — ABNORMAL HIGH (ref 6–23)
Calcium: 7.9 mg/dL — ABNORMAL LOW (ref 8.4–10.5)
Chloride: 102 mEq/L (ref 96–112)
GFR calc Af Amer: 48 mL/min — ABNORMAL LOW (ref >90)
Potassium: 3.1 mEq/L — ABNORMAL LOW (ref 3.5–5.1)

## 2013-10-16 LAB — CARBOXYHEMOGLOBIN
Carboxyhemoglobin: 1.5 % (ref 0.5–1.5)
Methemoglobin: 1.9 % — ABNORMAL HIGH (ref 0.0–1.5)
O2 Saturation: 67.1 %
Total hemoglobin: 9.8 g/dL — ABNORMAL LOW (ref 13.5–18.0)

## 2013-10-16 LAB — NA AND K (SODIUM & POTASSIUM), RAND UR
Potassium Urine: 39 mEq/L
Sodium, Ur: 11 mEq/L

## 2013-10-16 LAB — HEPARIN LEVEL (UNFRACTIONATED): Heparin Unfractionated: 0.43 IU/mL (ref 0.30–0.70)

## 2013-10-16 MED ORDER — POTASSIUM CHLORIDE 10 MEQ/50ML IV SOLN
10.0000 meq | INTRAVENOUS | Status: AC
  Administered 2013-10-16 (×3): 10 meq via INTRAVENOUS
  Filled 2013-10-16: qty 50

## 2013-10-16 MED ORDER — FUROSEMIDE 10 MG/ML IJ SOLN
5.0000 mg/h | INTRAVENOUS | Status: DC
  Administered 2013-10-16: 10 mg/h via INTRAVENOUS
  Administered 2013-10-17 (×2): 20 mg/h via INTRAVENOUS
  Administered 2013-10-19 (×2): 5 mg/h via INTRAVENOUS
  Filled 2013-10-16 (×11): qty 25

## 2013-10-16 MED ORDER — ENOXAPARIN SODIUM 150 MG/ML ~~LOC~~ SOLN
125.0000 mg | Freq: Two times a day (BID) | SUBCUTANEOUS | Status: DC
  Administered 2013-10-16 – 2013-10-24 (×16): 125 mg via SUBCUTANEOUS
  Filled 2013-10-16 (×19): qty 1

## 2013-10-16 MED ORDER — AMIODARONE HCL 200 MG PO TABS
400.0000 mg | ORAL_TABLET | Freq: Two times a day (BID) | ORAL | Status: DC
  Administered 2013-10-16 – 2013-10-20 (×8): 400 mg via ORAL
  Filled 2013-10-16 (×9): qty 2

## 2013-10-16 MED ORDER — ENSURE PUDDING PO PUDG
1.0000 | Freq: Three times a day (TID) | ORAL | Status: DC
  Administered 2013-10-17 – 2013-10-21 (×4): 1 via ORAL

## 2013-10-16 MED ORDER — METOLAZONE 5 MG PO TABS
5.0000 mg | ORAL_TABLET | Freq: Two times a day (BID) | ORAL | Status: DC
  Administered 2013-10-16 – 2013-10-17 (×3): 5 mg via ORAL
  Filled 2013-10-16 (×5): qty 1

## 2013-10-16 NOTE — Progress Notes (Addendum)
TCTS DAILY ICU PROGRESS NOTE                   [address]            [place] [address]          [phone]   8 Days Post-Op Procedure(s) (LRB): CORONARY ARTERY BYPASS GRAFTING (CABG) (N/A) INTRAOPERATIVE TRANSESOPHAGEAL ECHOCARDIOGRAM (N/A)  Total Length of Stay:  LOS: 11 days   Subjective: Feels "okay" this am.  Desats noted overnight and pt now on Venti mask.  Denies SOB or cough.   Objective: Vital signs in last 24 hours: Temp:  [97.8 F (36.6 C)-98.5 F (36.9 C)] 98.1 F (36.7 C) (11/12 0732) Pulse Rate:  [78-94] 88 (11/12 0730) Cardiac Rhythm:  [-] Atrial paced (11/12 0400) Resp:  [13-29] 16 (11/12 0730) BP: (88-132)/(45-92) 113/52 mmHg (11/12 0715) SpO2:  [85 %-100 %] 97 % (11/12 0730) FiO2 (%):  [50 %-60 %] 50 % (11/12 0000) Weight:  [277 lb 12.5 oz (126 kg)] 277 lb 12.5 oz (126 kg) (11/12 0500)  Filed Weights   10/14/13 0700 10/15/13 0500 10/16/13 0500  Weight: 274 lb 7.6 oz (124.5 kg) 272 lb 14.9 oz (123.8 kg) 277 lb 12.5 oz (126 kg)    Weight change: 4 lb 13.6 oz (2.2 kg)   Hemodynamic parameters for last 24 hours: CVP:  [12 mmHg-16 mmHg] 14 mmHg  Intake/Output from previous day: 11/11 0701 - 11/12 0700 In: 3396.9 [P.O.:50; I.V.:2734.4; Blood:512.5; IV Piggyback:100] Out: 638 [Urine:637; Stool:1]  CBGs 123-119-110-116-126     Current Meds: Scheduled Meds: . aspirin EC  325 mg Oral Daily   Or  . aspirin  324 mg Per Tube Daily  . atorvastatin  80 mg Oral q1800  . bisacodyl  10 mg Oral Daily   Or  . bisacodyl  10 mg Rectal Daily  . budesonide (PULMICORT) nebulizer solution  0.25 mg Nebulization BID  . cefTAZidime (FORTAZ)  IV  1 g Intravenous Q8H  . docusate sodium  200 mg Oral Daily  . furosemide  40 mg Intravenous BID  . insulin aspart  0-24 Units Subcutaneous Q4H  . levalbuterol  1.25 mg Nebulization Q6H  . metoprolol tartrate  12.5 mg Oral BID   Or  . metoprolol tartrate  12.5 mg Per Tube BID  . pantoprazole  (PROTONIX) IV  40 mg Intravenous Q24H  . sodium chloride  3 mL Intravenous Q12H   Continuous Infusions: . sodium chloride 50 mL/hr at 10/16/13 0600  . amiodarone (NEXTERONE PREMIX) 360 mg/200 mL dextrose 30.06 mg/hr (10/16/13 0600)  . DOPamine 2.485 mcg/kg/min (10/16/13 0600)  . heparin 1,450 Units/hr (10/16/13 0600)  . milrinone 0.201 mcg/kg/min (10/16/13 0600)  . phenylephrine (NEO-SYNEPHRINE) Adult infusion Stopped (10/16/13 0100)   PRN Meds:.ALPRAZolam, metoprolol, ondansetron (ZOFRAN) IV, oxyCODONE, RESOURCE THICKENUP CLEAR, sodium chloride, sodium chloride, traMADol   Physical Exam: General appearance: alert, cooperative and no distress Heart: Atrial paced at 88 Lungs: Few crackles in bases bilaterally Extremities: + LE edema, but appears to be somewhat improved Wound: Small amount of serosanguinous drainage from upper sternum, otherwise incisions intact and dry     Lab Results: CBC: Recent Labs  10/15/13 0104 10/16/13 0404  WBC 8.9 11.3*  HGB 6.9* 9.5*  HCT 21.6* 28.7*  PLT 363 461*   BMET:  Recent Labs  10/15/13 1500 10/16/13 0404  NA 142 143  K 3.2* 3.2*  CL 104 103  CO2 28 26  GLUCOSE 125* 126*  BUN  64* 68*  CREATININE 1.95* 2.07*  CALCIUM 8.3* 8.1*    PT/INR: No results found for this basename: LABPROT, INR,  in the last 72 hours   Radiology:  FINDINGS:  Cardiomegaly. Mild pulmonary vascular prominence. Prior CABG.  Bilateral alveolar infiltrates present. Small bilateral pleural  effusions cannot be excluded. These findings are consistent with  congestive heart failure with pulmonary edema. Persistent bibasilar  atelectasis. No pneumothorax. Stable PICC line with tip in good  anatomic position.  IMPRESSION:  1. Congestive heart failure with bilateral pulmonary edema and small  pleural effusions. Prior CABG.  2. Persistent basilar atelectasis. Low lung volumes.  3. PICC line in stable position.   2D Echo:  HPI and indications: Pericardial  effusion 423.9. Limited study. - Procedure narrative: Transthoracic echocardiography. Image quality was suboptimal. The study was technically difficult, as a result of poor acoustic windows, poor sound wave transmission, chest wall deformity, surgical dressings, excessive abdominal air, and body habitus. - Left ventricle: The cavity size was normal. There was mild concentric hypertrophy. Systolic function was moderately to severely reduced. The estimated ejection fraction was in the range of 30% to 35%. There is severe hypokinesis to akinesis of the anterior, anteroseptal, apical and mid to distal inferoapical and distal lateral / apical lateral walls consistent with extensive LAD/diagonal territory infarct. - Systemic veins: IVC not visualized. - Pericardium, extracardiac: There was no pericardial effusion. Impressions:  - There are no significant changes compared to the pre-operative echocardiogram.    Assessment/Plan: S/P Procedure(s) (LRB): CORONARY ARTERY BYPASS GRAFTING (CABG) (N/A) INTRAOPERATIVE TRANSESOPHAGEAL ECHOCARDIOGRAM (N/A)  CV- AF, now a-paced at 88.  On Amiodarone, Dopamine, Milrinone.  BPs improved and off Neo. Echo stable.  Vol overload- continue IV Lasix.  Acute renal insufficiency- Cr up slightly and UOP marginal. Continue gtts and monitor closely.  Expected postop blood loss anemia- improved after transfusion.  Watch.  L peroneal DVT- on Heparin.  Hypokalemia- replace K+.  Endo- CBGs stable.  Pulm- Continue aggressive pulm toilet.  D#8 Fortaz-WBC up slightly but afebrile and sputum cx was negative.  Hopefully can d/c abx soon.  Nutrition- diet advanced to D3.   COLLINS,GINA H 10/16/2013 7:39 AM  I have seen and examined the patient and agree with the assessment and plan as outlined.  Mr Humble looks overall stable, maintaining NSR w/ stable BP on milrinone @ 0.2 and dopamine @ 2.5 with last coox 67%.  However, UOP remains marginal and  creatinine has risen further, and CXR now demonstrates worsening pulmonary edema.  I/O's positive 5 liters last 2 days and weight 13 kg > baseline.  Will increase dopamine to 3 and start lasix drip.  Continue milrinone and dopamine without weaning for now.  Continue heparin and start coumadin tomorrow for DVT.    OWEN,CLARENCE H 10/16/2013 8:22 AM

## 2013-10-16 NOTE — Progress Notes (Signed)
ANTICOAGULATION CONSULT NOTE - Follow Up Consult  Pharmacy Consult for Heparin --> Lovenox Indication: DVT  Allergies  Allergen Reactions  . Demerol [Meperidine] Other (See Comments)    "makes me crazy"  . Lisinopril Diarrhea  . Shellfish Allergy Rash    Patient Measurements: Height: 6\' 1"  (185.4 cm) Weight: 277 lb 12.5 oz (126 kg) IBW/kg (Calculated) : 79.9 Heparin Dosing Weight:   Vital Signs: Temp: 98.3 F (36.8 C) (11/12 1521) Temp src: Oral (11/12 1521) BP: 118/58 mmHg (11/12 1800) Pulse Rate: 75 (11/12 1800)  Labs:  Recent Labs  10/15/13 0100 10/15/13 0104 10/15/13 1500 10/15/13 2030 10/16/13 0404 10/16/13 1750  HGB 8.0* 6.9*  --   --  9.5*  --   HCT 24.4* 21.6*  --   --  28.7*  --   PLT 383 363  --   --  461*  --   HEPARINUNFRC  --   --   --  0.35 0.43  --   CREATININE 1.73* 0.50 1.95*  --  2.07* 1.56*    Estimated Creatinine Clearance: 55.1 ml/min (by C-G formula based on Cr of 1.56).   Medications:  Heparin 1450 units/hr  Assessment: Antonio Bernard on heparin for acute DVT. Pharmacy has been consulted to transition patient to Lovenox therapy to minimize fluid. Heparin has been therapuetic x 2 levels - will plan to discontinue heparin drip and give Lovenox 1mg /kg q12h starting 1 hour after heparin discontinuation.  - Hg 9.5, Plts wnl - No bleeding per RN - Weight: 126kg - SCr improving, CrCl ~55  Goal of Therapy:  Anti-Xa level 0.6-1.2 units/ml 4hrs after LMWH dose given Monitor platelets by anticoagulation protocol: Yes   Plan:  1. Discontinue heparin drip @ 1900 tonight - notified RN 2. Lovenox 125mg  (~1 mg/kg) SQ q12h - first dose @ 2000 tonight 3. Monitor CBC, renal function and s/sx of bleeding  Cleon Dew [phone] 10/16/2013,6:57 PM

## 2013-10-16 NOTE — Progress Notes (Signed)
Chaplain visited briefly with pt's daughter while pt was napping in recliner. Chaplain offered spiritual and emotional support and explained that chaplains are available and may be paged by pt's nurses. Pt's daughter was appreciative of chaplain support. Will follow up.   Maurene Capes, [place] [phone]

## 2013-10-16 NOTE — Progress Notes (Signed)
Patient ID: [name], male   DOB: 1933-09-06, 79 y.o.   MRN: [id number] EVENING ROUNDS NOTE :     [address]       [place] [address]             [phone]                 8 Days Post-Op Procedure(s) (LRB): CORONARY ARTERY BYPASS GRAFTING (CABG) (N/A) INTRAOPERATIVE TRANSESOPHAGEAL ECHOCARDIOGRAM (N/A)  Total Length of Stay:  LOS: 11 days  BP 118/58  Pulse 75  Temp(Src) 98.3 F (36.8 C) (Oral)  Resp 19  Ht 6\' 1"  (1.854 m)  Wt 277 lb 12.5 oz (126 kg)  BMI 36.66 kg/m2  SpO2 94%  .Intake/Output     11/11 0701 - 11/12 0700 11/12 0701 - 11/13 0700   P.O. 50    I.V. (mL/kg) 2828.9 (22.5) 1069.4 (8.5)   Blood 512.5    IV Piggyback 100 50   Total Intake(mL/kg) 3491.4 (27.7) 1119.4 (8.9)   Urine (mL/kg/hr) 637 (0.2) 500 (0.4)   Stool 1 (0)    Total Output 638 500   Net +2853.4 +619.4          . sodium chloride 50 mL/hr at 10/16/13 1500  . DOPamine 3 mcg/kg/min (10/16/13 1800)  . furosemide (LASIX) infusion 10 mg/hr (10/16/13 1700)  . heparin 1,450 Units/hr (10/16/13 1800)  . milrinone 0.2 mcg/kg/min (10/16/13 1800)     Lab Results  Component Value Date   WBC 11.3* 10/16/2013   HGB 9.5* 10/16/2013   HCT 28.7* 10/16/2013   PLT 461* 10/16/2013   GLUCOSE 126* 10/16/2013   ALT 47 10/16/2013   AST 44* 10/16/2013   NA 143 10/16/2013   K 3.2* 10/16/2013   CL 103 10/16/2013   CREATININE 2.07* 10/16/2013   BUN 68* 10/16/2013   CO2 26 10/16/2013   TSH 1.305 10/07/2013   INR 1.30 10/10/2013   HGBA1C 5.5 10/06/2013   deteriorating renal function Discussed with cardiology Consider cvvh if uop remains low with edema  Delight Ovens MD  Beeper [phone] Office [phone] 10/16/2013 6:15 PM

## 2013-10-16 NOTE — Progress Notes (Signed)
NUTRITION FOLLOW UP  Intervention:    Ensure Pudding 3 times daily (170 kcals, 4 gm protein per 4 oz cup) RD to follow for nutrition care plan  Nutrition Dx:   Inadequate oral intake now related to poor appetite as evidenced by family report, ongoing  Goal:   Pt to meet >/= 90% of their estimated nutrition needs, unmet  Monitor:   PO & supplemental intake, weight, labs, I/O's  Assessment:   Patient with PMH of HTN, OSA, obesity, AAA with anxiety; patient developed substernal chest pressure and his daughter called EMS; brought emergently to cardiac cath lab where he was evaluated -- CVTS consulted.   Patient s/p procedures 11/4:  CORONARY ARTERY BYPASS GRAFTING (CABG)  INTRAOPERATIVE TRANSESOPHAGEAL ECHOCARDIOGRAM  [facility] RIGHT THIGH  Patient s/p bedside swallow evaluation 11/11 -- patient with acute reversible dysphagia related to respiratory status, s/p intubation.  PO intake poor per patient's daughter -- "taking bites".  Would benefit from addition of nutrition supplements -- RD to order.  Height: Ht Readings from Last 1 Encounters:  10/07/13 6\' 1"  (1.854 m)    Weight Status:   Wt Readings from Last 1 Encounters:  10/16/13 277 lb 12.5 oz (126 kg)    Re-estimated needs:  Kcal: 2150-2350 Protein: 125-135 gm Fluid: 2.1-2.3 L  Skin: chest & leg surgical incisions   Diet Order: Dysphagia 3, nectar thick liquids   Intake/Output Summary (Last 24 hours) at 10/16/13 1441 Last data filed at 10/16/13 1327  Gross per 24 hour  Intake 2999.7 ml  Output    685 ml  Net 2314.7 ml    Labs:   Recent Labs Lab 10/09/13 1600  10/15/13 0104 10/15/13 1500 10/16/13 0404  NA 135  < > 127* 142 143  K 3.7  < > 2.6* 3.2* 3.2*  CL 99  < > 85* 104 103  CO2 28  < > 22 28 26   BUN 26*  < > 51* 64* 68*  CREATININE 1.10  < > 0.50 1.95* 2.07*  CALCIUM 8.1*  < > 7.6* 8.3* 8.1*  MG 2.1  --   --   --   --   GLUCOSE 133*  < > 670* 125* 126*  < > = values in this interval not  displayed.  CBG (last 3)   Recent Labs  10/16/13 0400 10/16/13 0735 10/16/13 1205  GLUCAP 116* 117* 122*    Scheduled Meds: . aspirin EC  325 mg Oral Daily  . atorvastatin  80 mg Oral q1800  . bisacodyl  10 mg Oral Daily   Or  . bisacodyl  10 mg Rectal Daily  . budesonide (PULMICORT) nebulizer solution  0.25 mg Nebulization BID  . cefTAZidime (FORTAZ)  IV  1 g Intravenous Q8H  . docusate sodium  200 mg Oral Daily  . insulin aspart  0-24 Units Subcutaneous Q4H  . levalbuterol  1.25 mg Nebulization Q6H  . pantoprazole (PROTONIX) IV  40 mg Intravenous Q24H  . sodium chloride  3 mL Intravenous Q12H    Continuous Infusions: . sodium chloride 50 mL/hr at 10/16/13 1200  . amiodarone (NEXTERONE PREMIX) 360 mg/200 mL dextrose 30 mg/hr (10/16/13 1327)  . DOPamine 3 mcg/kg/min (10/16/13 1327)  . furosemide (LASIX) infusion 10 mg/hr (10/16/13 1327)  . heparin 1,450 Units/hr (10/16/13 1327)  . milrinone 0.2 mcg/kg/min (10/16/13 1327)    Maureen Chatters, RD, LDN Pager #: [phone] After-Hours Pager #: [phone]

## 2013-10-16 NOTE — Evaluation (Signed)
Physical Therapy Evaluation Patient Details Name: Antonio Bernard MRN: [id number] DOB: 01-07-1933 Today's Date: 10/16/2013 Time: 4098-1191 PT Time Calculation (min): 48 min  PT Assessment / Plan / Recommendation History of Present Illness  Adm 11/01 with MI; 11/05 CABG x 2 and remained on vent until 11/09  Clinical Impression  Patient is s/p above surgery resulting in functional limitations due to the deficits listed below (see PT Problem List).  Patient will benefit from skilled PT to increase their independence and safety with mobility to allow discharge to the venue listed below. Pt had a longer time on vent and an extra day of bedrest due to  RLE DVT and making slower progress. Very good family support, however may need post-acute therapy prior to d/c home     PT Assessment  Patient needs continued PT services    Follow Up Recommendations  CIR;Supervision/Assistance - 24 hour    Does the patient have the potential to tolerate intense rehabilitation      Barriers to Discharge Decreased caregiver support Daughter inquiring about CIR; states pt will not go to SNF    Equipment Recommendations  None recommended by PT    Recommendations for Other Services Rehab consult;OT consult   Frequency Min 3X/week    Precautions / Restrictions Precautions Precautions: Sternal;Fall Restrictions Weight Bearing Restrictions: No   Pertinent Vitals/Pain BP 110s-120s/70s SaO2 88-95% on FM 50% FiO2      Mobility  Bed Mobility Bed Mobility: Supine to Sit;Sitting - Scoot to Edge of Bed Supine to Sit: 1: +2 Total assist;HOB elevated Supine to Sit: Patient Percentage: 30% Sitting - Scoot to Edge of Bed: 3: Mod assist Details for Bed Mobility Assistance: Pt on FM for decr SaO2 with HOB 40 on arrival; progressed HOB up to 50 and turned to sit at EOB to minimize respiratory stress. Discussed procedure he will ultimately use. Transfers Transfers: Sit to Stand;Stand to Sit Sit to Stand: 1:  +2 Total assist Sit to Stand: Patient Percentage: 80% Stand to Sit: 1: +2 Total assist Stand to Sit: Patient Percentage: 80% Details for Transfer Assistance: stood x 4 for transfers on/off BSC and for pt to complete having BM as he started to go again when he first stood from [facility] Ambulation/Gait Ambulation/Gait Assistance: Not tested (comment) (pivotal steps Lt then Rt to/from [facility]; legs weak)    Exercises General Exercises - Lower Extremity Ankle Circles/Pumps: AROM;Both;10 reps;Supine Heel Slides: AAROM;Both;5 reps;Supine   PT Diagnosis: Difficulty walking;Acute pain  PT Problem List: Decreased strength;Decreased activity tolerance;Decreased balance;Decreased mobility;Decreased knowledge of use of DME;Decreased knowledge of precautions;Obesity;Pain PT Treatment Interventions: DME instruction;Gait training;Stair training;Functional mobility training;Therapeutic activities;Therapeutic exercise;Patient/family education     PT Goals(Current goals can be found in the care plan section) Acute Rehab PT Goals Patient Stated Goal: "get back to the pulpit" PT Goal Formulation: With patient Time For Goal Achievement: 10/23/13 Potential to Achieve Goals: Good  Visit Information  Last PT Received On: 10/16/13 Assistance Needed: +2 History of Present Illness: Adm 11/01 with MI; 11/05 CABG x 2 and remained on vent until 11/09       Prior Functioning  Home Living Family/patient expects to be discharged to:: Private residence Living Arrangements: Alone (widowed) Available Help at Discharge: Family Type of Home: House Home Access: Stairs to enter Secretary/administrator of Steps: 1+1 Entrance Stairs-Rails: None Home Layout: Laundry or work area in basement;Able to live on main level with bedroom/bathroom Home Equipment: Environmental consultant - 2 wheels Additional Comments: daughter unsure if pt acquired other  equipment s/p back surgery in 2013. Pt has 5 local children who managed to provide 24/7 care x 6  weeks at time of back surgery.  Prior Function Level of Independence: Independent Comments: playing golf PTA Communication Communication: No difficulties    Cognition  Cognition Arousal/Alertness: Lethargic;Suspect due to medications Behavior During Therapy: WFL for tasks assessed/performed Overall Cognitive Status: Within Functional Limits for tasks assessed    Extremity/Trunk Assessment Upper Extremity Assessment Upper Extremity Assessment: RUE deficits/detail;LUE deficits/detail RUE Deficits / Details: incr edema; AROM to 90 at shoulder LUE Deficits / Details: incr edema; AROM to 90 at shoulder Lower Extremity Assessment Lower Extremity Assessment: Generalized weakness (+edema) Cervical / Trunk Assessment Cervical / Trunk Assessment: Normal   Balance Balance Balance Assessed: Yes Static Sitting Balance Static Sitting - Balance Support: No upper extremity supported;Feet supported Static Sitting - Level of Assistance: 5: Stand by assistance Static Standing Balance Static Standing - Balance Support: Bilateral upper extremity supported Static Standing - Level of Assistance: 1: +2 Total assist (pt 90%; legs shakey)  End of Session PT - End of Session Equipment Utilized During Treatment: Oxygen Activity Tolerance: Patient limited by fatigue Patient left: in chair;with call bell/phone within reach Nurse Communication: Mobility status;Other (comment) (+BM on BSC)  GP     Krisna Omar 10/16/2013, 11:34 AM Pager [phone]

## 2013-10-16 NOTE — Progress Notes (Deleted)
Md notified of pts continued ectopy and swan readings. MD ordered chest x-ray to assess placement of swan. X-ray ordered and has been performed results pending.

## 2013-10-16 NOTE — Progress Notes (Signed)
Subjective: No complaints.   Objective: Vital signs in last 24 hours: Temp:  [97.8 F (36.6 C)-98.5 F (36.9 C)] 98.1 F (36.7 C) (11/12 0732) Pulse Rate:  [78-94] 84 (11/12 1100) Resp:  [13-28] 23 (11/12 1100) BP: (96-132)/(45-92) 123/64 mmHg (11/12 1100) SpO2:  [87 %-99 %] 92 % (11/12 1100) FiO2 (%):  [50 %-60 %] 50 % (11/12 0800) Weight:  [277 lb 12.5 oz (126 kg)] 277 lb 12.5 oz (126 kg) (11/12 0500) Last BM Date: 10/16/13  Intake/Output from previous day: 11/11 0701 - 11/12 0700 In: 3491.4 [P.O.:50; I.V.:2828.9; Blood:512.5; IV Piggyback:100] Out: 638 [Urine:637; Stool:1] Intake/Output this shift: Total I/O In: 429.4 [I.V.:429.4] Out: 115 [Urine:115]  Medications Current Facility-Administered Medications  Medication Dose Route Frequency Provider Last Rate Last Dose  . 0.9 %  sodium chloride infusion   Intravenous Continuous Kerin Perna, MD 50 mL/hr at 10/16/13 1115    . ALPRAZolam Prudy Feeler) tablet 0.5 mg  0.5 mg Oral TID PRN Kerin Perna, MD   0.5 mg at 10/16/13 0931  . amiodarone (NEXTERONE PREMIX) 360 mg/200 mL dextrose IV infusion  30 mg/hr Intravenous Continuous Kerin Perna, MD 16.7 mL/hr at 10/16/13 1100 30.06 mg/hr at 10/16/13 1100  . aspirin EC tablet 325 mg  325 mg Oral Daily Rowe Clack, PA-C   325 mg at 10/16/13 0931  . atorvastatin (LIPITOR) tablet 80 mg  80 mg Oral q1800 Wayne E Gold, PA-C   80 mg at 10/15/13 1808  . bisacodyl (DULCOLAX) EC tablet 10 mg  10 mg Oral Daily Wayne E Gold, PA-C       Or  . bisacodyl (DULCOLAX) suppository 10 mg  10 mg Rectal Daily Rowe Clack, PA-C   10 mg at 10/11/13 0909  . budesonide (PULMICORT) nebulizer solution 0.25 mg  0.25 mg Nebulization BID Kerin Perna, MD   0.25 mg at 10/16/13 0755  . cefTAZidime (FORTAZ) 1 g in dextrose 5 % 50 mL IVPB  1 g Intravenous Q8H Kerin Perna, MD   1 g at 10/16/13 0931  . docusate sodium (COLACE) capsule 200 mg  200 mg Oral Daily Wayne E Gold, PA-C   200 mg at 10/16/13  0930  . DOPamine (INTROPIN) 800 mg in dextrose 5 % 250 mL infusion  3 mcg/kg/min Intravenous Titrated Purcell Nails, MD 7 mL/hr at 10/16/13 1100 2.999 mcg/kg/min at 10/16/13 1100  . furosemide (LASIX) 250 mg in dextrose 5 % 250 mL infusion  10 mg/hr Intravenous Continuous Purcell Nails, MD 10 mL/hr at 10/16/13 1100 10 mg/hr at 10/16/13 1100  . heparin ADULT infusion 100 units/mL (25000 units/250 mL)  1,450 Units/hr Intravenous Continuous Riki Rusk, RPH 14.5 mL/hr at 10/16/13 1100 1,450 Units/hr at 10/16/13 1100  . insulin aspart (novoLOG) injection 0-24 Units  0-24 Units Subcutaneous Q4H Kerin Perna, MD   2 Units at 10/15/13 1545  . levalbuterol (XOPENEX) nebulizer solution 1.25 mg  1.25 mg Nebulization Q6H Kerin Perna, MD   1.25 mg at 10/16/13 0755  . metoprolol (LOPRESSOR) injection 2.5-5 mg  2.5-5 mg Intravenous Q2H PRN Wayne E Gold, PA-C      . milrinone [facility]) infusion 200 mcg/mL (0.2 mg/ml)  0.2 mcg/kg/min Intravenous Continuous Kerin Perna, MD 7.5 mL/hr at 10/16/13 1100 0.201 mcg/kg/min at 10/16/13 1100  . ondansetron (ZOFRAN) injection 4 mg  4 mg Intravenous Q6H PRN Wayne E Gold, PA-C      . oxyCODONE (Oxy IR/ROXICODONE) immediate release  tablet 5-10 mg  5-10 mg Oral Q3H PRN Rowe Clack, PA-C   10 mg at 10/16/13 0134  . pantoprazole (PROTONIX) injection 40 mg  40 mg Intravenous Q24H Kerin Perna, MD   40 mg at 10/15/13 2139  . RESOURCE THICKENUP CLEAR   Oral PRN Kerin Perna, MD      . sodium chloride 0.9 % injection 10-40 mL  10-40 mL Intracatheter PRN Kerin Perna, MD      . sodium chloride 0.9 % injection 3 mL  3 mL Intravenous Q12H Wayne E Gold, PA-C   3 mL at 10/16/13 0931  . sodium chloride 0.9 % injection 3 mL  3 mL Intravenous PRN Rowe Clack, PA-C      . traMADol Janean Sark) tablet 50 mg  50 mg Oral Q6H PRN Kerin Perna, MD   50 mg at 10/16/13 1015    PE: General appearance: alert, cooperative and no distress Lungs: bibasilar crackles Heart:  regular rate and rhythm Extremities: 2+ LEE Pulses: 2+ and symmetric Skin: warm and dry Neurologic: Grossly normal  Lab Results:   Recent Labs  10/15/13 0100 10/15/13 0104 10/16/13 0404  WBC 10.6* 8.9 11.3*  HGB 8.0* 6.9* 9.5*  HCT 24.4* 21.6* 28.7*  PLT 383 363 461*   BMET  Recent Labs  10/15/13 0104 10/15/13 1500 10/16/13 0404  NA 127* 142 143  K 2.6* 3.2* 3.2*  CL 85* 104 103  CO2 22 28 26   GLUCOSE 670* 125* 126*  BUN 51* 64* 68*  CREATININE 0.50 1.95* 2.07*  CALCIUM 7.6* 8.3* 8.1*    Assessment/Plan  Principal Problem:   STEMI of anterior wall,  10/05/13 Active Problems:   Essential hypertension   Obesity (BMI 35.0-39.9 without comorbidity)   S/P urgent LAD PTCA 10/06/13 with IABP for LAD perfusion   Anxiety, claustrophobia   DJD  lumbar- surg August 2013   Sleep apnea- C-pap intol   AAA (abdominal aortic aneurysm)-hx of a "small" AAA found July 2013 (nothing in EPIC)   S/P CABG x 2: LIMA-LAD, SVG-DIAG  Plan: Day 7 s/p CAGB. He remains on Dopamine and Milrinone. NSR on telemetry. Continue Amiodarone. Replete K+ Continue Lasix drip for diuresis. Continue heparin for DVT. Will continue to follow along. MD to follow.    LOS: 11 days    Brittainy M. Delmer Islam 10/16/2013 11:52 AM   Patient seen and examined. Agree with assessment and plan. Pt also just seen by Dr. Gala Romney of Advanced Heart Failure team. Agree with his recommendations.   Lennette Bihari, MD, [facility] 10/16/2013 6:36 PM

## 2013-10-16 NOTE — Progress Notes (Signed)
ANTICOAGULATION CONSULT NOTE - Initial Consult  Pharmacy Consult for Heparin Indication: DVT  Allergies  Allergen Reactions  . Demerol [Meperidine] Other (See Comments)    "makes me crazy"  . Lisinopril Diarrhea  . Shellfish Allergy Rash    Patient Measurements: Height: 6\' 1"  (185.4 cm) Weight: 277 lb 12.5 oz (126 kg) IBW/kg (Calculated) : 79.9 Heparin Dosing Weight: 107 kg  Vital Signs: Temp: 98.4 F (36.9 C) (11/12 1209) Temp src: Oral (11/12 1209) BP: 119/53 mmHg (11/12 1300) Pulse Rate: 77 (11/12 1300)  Labs:  Recent Labs  10/15/13 0100 10/15/13 0104 10/15/13 1500 10/15/13 2030 10/16/13 0404  HGB 8.0* 6.9*  --   --  9.5*  HCT 24.4* 21.6*  --   --  28.7*  PLT 383 363  --   --  461*  HEPARINUNFRC  --   --   --  0.35 0.43  CREATININE 1.73* 0.50 1.95*  --  2.07*    Estimated Creatinine Clearance: 41.6 ml/min (by C-G formula based on Cr of 2.07).  Assessment: 71 YOM admitted on 11/1 with STEMI, s/p urgent cath, and CABG on 11/4, now with new R- peroneal DVT, on IV heparin, heparin level stable and therapeutic on 1450 units/hr, Hgb low yesterday, s/p 1 unit PRBC, hgb up to 9.5, plt 461, No bleeding noted per chart.  Goal of Therapy:  Heparin level 0.3-0.7 units/ml Monitor platelets by anticoagulation protocol: Yes   Plan:  - heparin infusion 1450 units/hr - daily heparin level and CBC - monitor cbc closely  Bayard Hugger, PharmD, BCPS  Clinical Pharmacist  Pager: [phone]   10/16/2013,1:58 PM

## 2013-10-16 NOTE — Consult Note (Signed)
Reason for Consult:AKI in setting of acute decompensated systolic CHF Referring Physician: Jones Broom, MD  [name] is an 79 y.o. male.  HPI: Pt is a 79yo M with PMH sig for HTN, obesity, OSA, and STEMI s/p urgent LAD PTCA (10/05/13) with IABP and CABG x2 (10/08/13) currently on milrinone 0.2 mcg, dopamine 3 mcg, lasix gtt 20 mg/hr (was on 10mg /hr until today), heparin 1450 and amiodarone 30 mg/hr. Per I's/O's he is up 12kg since surgery and UOP has been dropping over the last few days.  Per Cardiology he remains SOB and on 50% venti mask. (ECHO 10/15/13 EF 30-35%). His trend in SCr is seen below.  We were asked to evaluate pt in setting of AKI and decompensated CHF with decrease UOP and rising Scr.  Of note he had been on Avalide prior to admission but has not been on an ACE/ARB.    Trend in Creatinine: Creatinine, Ser  Date/Time Value Range Status  10/16/2013  5:50 PM 1.56* 0.50 - 1.35 mg/dL Final  16/09/9603  5:40 AM 2.07* 0.50 - 1.35 mg/dL Final  98/10/9146  8:29 PM 1.95* 0.50 - 1.35 mg/dL Final  56/21/3086  5:78 AM 0.50  0.50 - 1.35 mg/dL Final  46/96/2952  8:41 AM 1.73* 0.50 - 1.35 mg/dL Final  32/44/0102  7:25 AM 1.15  0.50 - 1.35 mg/dL Final  36/05/4402  4:74 AM 1.06  0.50 - 1.35 mg/dL Final  25/08/5637  7:56 AM 0.99  0.50 - 1.35 mg/dL Final  43/02/2950  8:84 AM 0.97  0.50 - 1.35 mg/dL Final  16/05/629  1:60 PM 0.97  0.50 - 1.35 mg/dL Final  09/12/3234  5:73 AM 1.10  0.50 - 1.35 mg/dL Final  22/0/2542  7:06 PM 1.30  0.50 - 1.35 mg/dL Final  23/06/6282  1:51 PM 1.10  0.50 - 1.35 mg/dL Final  76/12/6071  7:10 AM 0.99  0.50 - 1.35 mg/dL Final  62/05/9484  4:62 PM 1.20  0.50 - 1.35 mg/dL Final  70/02/5008  3:81 PM 0.96  0.50 - 1.35 mg/dL Final  82/08/9370  6:96 AM 1.21  0.50 - 1.35 mg/dL Final  78/08/3809  1:75 AM 1.27  0.50 - 1.35 mg/dL Final  09/06/5851  7:78 AM 1.07  0.50 - 1.35 mg/dL Final  24/01/3535 14:43 AM 1.30  0.50 - 1.35 mg/dL Final  15/03/85 76:19 PM 1.23  0.50 - 1.35 mg/dL  Final  04/12/3266 12:45 AM 0.99  0.50 - 1.35 mg/dL Final  07/13/9832 82:50 AM 0.89  0.50 - 1.35 mg/dL Final    PMH:   Past Medical History  Diagnosis Date  . Acid reflux   . Family history of anesthesia complication     daughter had problem waking up  . Pneumonia     hx of  . Sleep apnea     no treatment for, last sleep study over 5 years ago  . Abdominal aortic aneurysm     found 06/29/12 "small"  . Headache(784.0)   . Herniated vertebral disc     thoracic area  . Cataracts, bilateral     hx of  . H/O Legionnaire's disease     about 24 years ago  . Anxiety     panic attacks   . Claustrophobia     "severe"  . Hypertension     sees  Dr. Ronne Binning [phone]  . Obesity (BMI 35.0-39.9 without comorbidity) 10/06/2013  . Essential hypertension 10/06/2013  . Anginal pain     PSH:   Past Surgical History  Procedure Laterality Date  . Shoulder surgery      left  . Cataract extraction w/ intraocular lens implant      bilateral  . Lumbar laminectomy/decompression microdiscectomy  07/20/2012    Procedure: LUMBAR LAMINECTOMY/DECOMPRESSION MICRODISCECTOMY 1 LEVEL;  Surgeon: Reinaldo Meeker, MD;  Location: [facility];  Service: Neurosurgery;  Laterality: N/A;  lumbar four-five left  . Back surgery    . Cardiac catheterization  10/05/2013  . Eye surgery    . Coronary artery bypass graft N/A 10/08/2013    Procedure: CORONARY ARTERY BYPASS GRAFTING (CABG);  Surgeon: Kerin Perna, MD;  Location: [facility] OR;  Service: Open Heart Surgery;  Laterality: N/A;  Times 2 using left internal mammary artery and endoscopically harvested left saphenous vein  . Intraoperative transesophageal echocardiogram N/A 10/08/2013    Procedure: INTRAOPERATIVE TRANSESOPHAGEAL ECHOCARDIOGRAM;  Surgeon: Kerin Perna, MD;  Location: [facility] OR;  Service: Open Heart Surgery;  Laterality: N/A;    Allergies:  Allergies  Allergen Reactions  . Demerol [Meperidine] Other (See Comments)    "makes me crazy"  .  Lisinopril Diarrhea  . Shellfish Allergy Rash    Medications:   Prior to Admission medications   Medication Sig Start Date End Date Taking? Authorizing Provider  ALPRAZolam (XANAX) 0.25 MG tablet Take 0.25 mg by mouth 3 (three) times daily as needed. For anxiety   Yes Historical Provider, MD  amLODipine (NORVASC) 10 MG tablet Take 10 mg by mouth daily.   Yes Historical Provider, MD  Ascorbic Acid (VITAMIN C) 1000 MG tablet Take 1,000 mg by mouth daily.   Yes Historical Provider, MD  aspirin EC 81 MG tablet Take 81 mg by mouth daily.   Yes Historical Provider, MD  cholecalciferol (VITAMIN D) 1000 UNITS tablet Take 2,000 Units by mouth daily.   Yes Historical Provider, MD  esomeprazole (NEXIUM) 40 MG capsule Take 40 mg by mouth daily before breakfast.   Yes Historical Provider, MD  fluticasone (FLONASE) 50 MCG/ACT nasal spray Place 2 sprays into the nose daily as needed. For allergies   Yes Historical Provider, MD  irbesartan-hydrochlorothiazide (AVALIDE) 300-12.5 MG per tablet Take 1 tablet by mouth daily.   Yes Historical Provider, MD  nebivolol (BYSTOLIC) 10 MG tablet Take 10 mg by mouth daily.   Yes Historical Provider, MD    Inpatient medications: . amiodarone  400 mg Oral BID  . aspirin EC  325 mg Oral Daily  . atorvastatin  80 mg Oral q1800  . bisacodyl  10 mg Oral Daily   Or  . bisacodyl  10 mg Rectal Daily  . budesonide (PULMICORT) nebulizer solution  0.25 mg Nebulization BID  . cefTAZidime (FORTAZ)  IV  1 g Intravenous Q8H  . docusate sodium  200 mg Oral Daily  . enoxaparin (LOVENOX) injection  125 mg Subcutaneous Q12H  . feeding supplement (ENSURE)  1 Container Oral TID BM  . insulin aspart  0-24 Units Subcutaneous Q4H  . levalbuterol  1.25 mg Nebulization Q6H  . metolazone  5 mg Oral BID  . pantoprazole (PROTONIX) IV  40 mg Intravenous Q24H  . sodium chloride  3 mL Intravenous Q12H    Discontinued Meds:   Medications Discontinued During This Encounter  Medication  Reason  . aspirin tablet 325 mg   . cetirizine (ZYRTEC) 10 MG tablet Discontinued by provider  . nitroGLYCERIN (NITROSTAT) SL tablet 0.4 mg Duplicate  . metoprolol tartrate (LOPRESSOR) tablet 25 mg   . aspirin chewable tablet 243 mg   .  heparin ADULT infusion 100 units/mL (25000 units/250 mL)   . morphine 4 MG/ML injection 4 mg   . metoprolol tartrate (LOPRESSOR) tablet 25 mg   . eptifibatide (INTEGRILIN) 75 mg / 100 mL infusion   . pantoprazole sodium (PROTONIX) 40 mg/20 mL oral suspension 40 mg Change in therapy  . ALPRAZolam (XANAX) tablet 0.5 mg   . mupirocin ointment (BACTROBAN) 2 % 1 application   . Chlorhexidine Gluconate Cloth 2 % PADS 6 each   . ALPRAZolam (XANAX) tablet 1 mg Dose change  . heparin ADULT infusion 100 units/mL (25000 units/250 mL)   . potassium chloride 10 mEq in 50 mL *CENTRAL LINE* IVPB   . vancomycin (VANCOCIN) 1,250 mg in sodium chloride 0.9 % 250 mL IVPB   . potassium chloride 10 mEq in 50 mL *CENTRAL LINE* IVPB   . phenylephrine (NEO-SYNEPHRINE) 20,000 mcg in dextrose 5 % 250 mL infusion   . Surgifoam 1 Gm with 0.9% sodium chloride (4 ml) topical solution Patient Discharge  . hemostatic agents Patient Discharge  . 0.9 % irrigation (POUR BTL) Patient Discharge  . hemostatic agents Patient Discharge  . Surgifoam 1 Gm with 0.9% sodium chloride (4 ml) topical solution Patient Discharge  . Surgifoam 1 Gm with 0.9% sodium chloride (4 ml) topical solution Patient Discharge  . 0.9 %  sodium chloride infusion Patient Transfer  . acetaminophen (TYLENOL) tablet 650 mg Patient Transfer  . ALPRAZolam (XANAX) tablet 0.25-0.5 mg Patient Transfer  . aminocaproic acid (AMICAR) 5 g in sodium chloride 0.9 % 50 mL infusion Patient Transfer  . aspirin EC tablet 81 mg Patient Transfer  . cefUROXime (ZINACEF) 750 mg in dextrose 5 % 50 mL IVPB   . dexmedetomidine (PRECEDEX) 200 MCG/50ML infusion Patient Transfer  . EPINEPHrine (ADRENALIN) 4,000 mcg in dextrose 5 % 250 mL  infusion   . furosemide (LASIX) injection 40 mg Patient Transfer  . heparin 30,000 units/NS 1000 mL solution for CELLSAVER   . heparin ADULT infusion 100 units/mL (25000 units/250 mL)   . magnesium sulfate (IV Push/IM) injection 40 mEq   . metoprolol tartrate (LOPRESSOR) tablet 25 mg Patient Transfer  . milrinone (PRIMACOR) infusion 200 mcg/mL (0.2 mg/ml)   . morphine 2 MG/ML injection 2 mg Patient Transfer  . nitroGLYCERIN (NITROSTAT) SL tablet 0.4 mg Patient Transfer  . nitroGLYCERIN 0.2 mg/mL in dextrose 5 % infusion Patient Transfer  . nitroGLYCERIN 0.2 mg/mL in dextrose 5 % infusion   . ondansetron (ZOFRAN) injection 4 mg Patient Transfer  . pantoprazole (PROTONIX) EC tablet 40 mg Patient Transfer  . potassium chloride injection 80 mEq   . sodium chloride 0.9 % injection 3 mL Patient Transfer  . sodium chloride 0.9 % injection 3 mL Patient Transfer  . atorvastatin (LIPITOR) tablet 80 mg   . lactated ringers infusion 500 mL   . vancomycin (VANCOCIN) IVPB 1000 mg/200 mL premix   . levalbuterol (XOPENEX) nebulizer solution 1.25 mg   . pantoprazole (PROTONIX) EC tablet 40 mg   . dexmedetomidine (PRECEDEX) 200 MCG/50ML infusion   . hydrocortisone sodium succinate (SOLU-CORTEF) injection PF 100 mg Entry Error  . norepinephrine (LEVOPHED) 4 mg in dextrose 5 % 250 mL infusion   . dexmedetomidine (PRECEDEX) 200 MCG/50ML infusion   . cefUROXime (ZINACEF) 1.5 g in dextrose 5 % 50 mL IVPB   . nitroGLYCERIN 0.2 mg/mL in dextrose 5 % infusion   . budesonide-formoterol (SYMBICORT) 160-4.5 MCG/ACT inhaler 2 puff   . dexmedetomidine (PRECEDEX) 200 MCG/50ML infusion Dose change  .  insulin regular (NOVOLIN R,HUMULIN R) 1 Units/mL in sodium chloride 0.9 % 100 mL infusion   . insulin regular bolus via infusion 0-10 Units   . milrinone (PRIMACOR) infusion 200 mcg/mL (0.2 mg/ml)   . chlorhexidine (PERIDEX) 0.12 % solution 15 mL   . antiseptic oral rinse (BIOTENE) solution 15 mL   . ALPRAZolam (XANAX)  tablet 1 mg   . dexmedetomidine (PRECEDEX) 400 MCG/100ML infusion   . DOPamine (INTROPIN) 800 mg in dextrose 5 % 250 mL infusion   . phenylephrine (NEO-SYNEPHRINE) 20,000 mcg in dextrose 5 % 250 mL infusion   . 0.45 % sodium chloride infusion   . lactated ringers infusion   . 0.9 %  sodium chloride infusion   . morphine 2 MG/ML injection 2-5 mg   . midazolam (VERSED) injection 2 mg   . norepinephrine (LEVOPHED) 16 mg in dextrose 5 % 250 mL infusion   . LORazepam (ATIVAN) bolus via infusion 0.5 mg   . 0.9 %  sodium chloride infusion   . ALPRAZolam (XANAX) tablet 0.25 mg   . sodium chloride 0.9 % injection 10-40 mL   . furosemide (LASIX) 250 mg in dextrose 5 % 250 mL infusion   . clonazePAM (KLONOPIN) tablet 1 mg   . enoxaparin (LOVENOX) injection 40 mg   . phenylephrine (NEO-SYNEPHRINE) 10 mg in dextrose 5 % 250 mL infusion   . enoxaparin (LOVENOX) injection 40 mg   . food thickener (THICK IT) powder   . potassium chloride 10 mEq in 50 mL *CENTRAL LINE* IVPB   . phenylephrine (NEO-SYNEPHRINE) 10 mg in dextrose 5 % 250 mL infusion   . aspirin chewable tablet 324 mg   . metoprolol tartrate (LOPRESSOR) tablet 12.5 mg   . metoprolol tartrate (LOPRESSOR) 25 mg/10 mL oral suspension 12.5 mg   . furosemide (LASIX) injection 40 mg   . amiodarone (NEXTERONE PREMIX) 360 mg/200 mL dextrose IV infusion     Social History:  reports that he has quit smoking. His smoking use included Cigarettes and Cigars. He smoked 0.50 packs per day. He has never used smokeless tobacco. He reports that he does not drink alcohol or use illicit drugs.  Family History:  History reviewed. No pertinent family history.  A comprehensive review of systems was negative except for: Constitutional: positive for fatigue Ears, nose, mouth, throat, and face: positive for dry mouth Respiratory: positive for mild SOB but improved with oxygen mask Cardiovascular: positive for edema Weight change: 2.2 kg (4 lb 13.6  oz)  Intake/Output Summary (Last 24 hours) at 10/16/13 1936 Last data filed at 10/16/13 1900  Gross per 24 hour  Intake 2936.02 ml  Output    915 ml  Net 2021.02 ml   BP 114/54  Pulse 73  Temp(Src) 98.3 F (36.8 C) (Oral)  Resp 16  Ht 6\' 1"  (1.854 m)  Wt 126 kg (277 lb 12.5 oz)  BMI 36.66 kg/m2  SpO2 95% Filed Vitals:   10/16/13 1600 10/16/13 1700 10/16/13 1800 10/16/13 1900  BP: 128/55 121/54 118/58 114/54  Pulse: 83 74 75 73  Temp:      TempSrc:      Resp: 19 17 19 16   Height:      Weight:      SpO2: 92% 93% 94% 95%     General appearance: fatigued, no distress and mildly obese Head: Normocephalic, without obvious abnormality, atraumatic Resp: wheezes bilaterally and expiratory Cardio: no rub GI: normal findings: bowel sounds normal and soft, nontender and abnormal findings:  distended Extremities: edema 2+/anasarca  Labs: Basic Metabolic Panel:  Recent Labs Lab 10/11/13 0410 10/12/13 0400  10/13/13 0400 10/13/13 1646 10/14/13 0500 10/15/13 0100 10/15/13 0104 10/15/13 1500 10/16/13 0404 10/16/13 1750  NA 136 137  < > 141 141 143 142 127* 142 143 141  K 3.2* 3.1*  < > 3.3* 4.0 3.3* 3.1* 2.6* 3.2* 3.2* 3.1*  CL 99 99  --  102  --  101 103 85* 104 103 102  CO2 28 30  --  29  --  25 26 22 28 26 25   GLUCOSE 105* 112*  < > 103* 112* 118* 123* 670* 125* 126* 164*  BUN 26* 32*  --  38*  --  47* 59* 51* 64* 68* 69*  CREATININE 0.97 0.99  --  1.06  --  1.15 1.73* 0.50 1.95* 2.07* 1.56*  ALBUMIN 2.3* 2.2*  --  2.1*  --  2.5* 2.7* 2.2*  --  2.8*  --   CALCIUM 7.3* 7.6*  --  7.2*  --  7.7* 8.4 7.6* 8.3* 8.1* 7.9*  < > = values in this interval not displayed. Liver Function Tests:  Recent Labs Lab 10/15/13 0100 10/15/13 0104 10/16/13 0404  AST 44* 33 44*  ALT 37 32 47  ALKPHOS 62 52 70  BILITOT 1.5* 1.3* 1.7*  PROT 5.4* 4.6* 5.8*  ALBUMIN 2.7* 2.2* 2.8*   No results found for this basename: LIPASE, AMYLASE,  in the last 168 hours No results found for  this basename: AMMONIA,  in the last 168 hours CBC:  Recent Labs Lab 10/14/13 0500 10/15/13 0100 10/15/13 0104 10/16/13 0404  WBC 10.3 10.6* 8.9 11.3*  HGB 9.4* 8.0* 6.9* 9.5*  HCT 28.4* 24.4* 21.6* 28.7*  MCV 93.1 93.1 96.0 91.1  PLT 369 383 363 461*   PT/INR: @LABRCNTIP (inr:5) Cardiac Enzymes: )No results found for this basename: CKTOTAL, CKMB, CKMBINDEX, TROPONINI,  in the last 168 hours CBG:  Recent Labs Lab 10/15/13 2336 10/16/13 0400 10/16/13 0735 10/16/13 1205 10/16/13 1523  GLUCAP 110* 116* 117* 122* 109*    Iron Studies: No results found for this basename: IRON, TIBC, TRANSFERRIN, FERRITIN,  in the last 168 hours  Xrays/Other Studies: Dg Chest Port 1 View  10/16/2013   CLINICAL DATA:  Atelectasis.  EXAM: PORTABLE CHEST - 1 VIEW  COMPARISON:  10/15/2013.  FINDINGS: Cardiomegaly. Mild pulmonary vascular prominence. Prior CABG. Bilateral alveolar infiltrates present. Small bilateral pleural effusions cannot be excluded. These findings are consistent with congestive heart failure with pulmonary edema. Persistent bibasilar atelectasis. No pneumothorax. Stable PICC line with tip in good anatomic position.  IMPRESSION: 1. Congestive heart failure with bilateral pulmonary edema and small pleural effusions. Prior CABG. 2. Persistent basilar atelectasis.  Low lung volumes. 3. PICC line in stable position.   Electronically Signed   By: Maisie Fus  Register   On: 10/16/2013 07:41   Dg Chest Port 1 View  10/15/2013   CLINICAL DATA:  Coronary artery bypass grafting.  EXAM: PORTABLE CHEST - 1 VIEW  COMPARISON:  10/14/2013.  FINDINGS: Interval removal of left IJ sheath. Right upper extremity PICC is present with the tip unchanged. Median sternotomy/ CABG. Basilar atelectasis. Allowing for low lung volumes, no airspace disease or effusion. Cardiopericardial silhouette mildly enlarged.  IMPRESSION: 1. Interval removal of left IJ vascular sheath. Unchanged right upper extremity PICC. 2. Low  volume chest with basilar atelectasis.   Electronically Signed   By: Andreas Newport M.D.   On: 10/15/2013 07:59  Assessment/Plan: 1.  AKI in setting of acute decompensated systolic CHF-  Discussed case with Dr. Jones Broom and increased lasix gtt to 20mg /hr and added zaroxolyn.  Started to see an improved response with increased UOP.  No indication for urgent SCUF at this point given improved Scr and UOP.  Can increase lasix gtt further to 30mg /hr if UOP tapers off (max 40mg /hr).  Will continue to follow along.  Could also consider weaning off of dopamine as BP allows. 2. STEMI s/p CABG x 2. On milrinone/dopa/amio/lipitor/metoprolol.  Plan per CT surgery and Cardiology 3. Acute systolic CHF (EF 16-10%) as above. Hopefully he will continue to respond to increased diuretics.  Hold off on SCUF for now 4. COPD/OSA- on xopenex  5. Hypokalemia- replete and follow 6. Hyponatremia- improved with diuresis. 7. Hyperglycemia- insulin per primary svc 8. ABLA- follow H/H and transfuse as needed.  Currently stable   Zephaniah Enyeart A 10/16/2013, 7:36 PM

## 2013-10-16 NOTE — Progress Notes (Signed)
Pt tolerated neb treatment, and RT was able to titrate O2 device to a 50% VM. RT worked with pt with flutter, which he did 10x with good effort. Pt had strong, but non-productive cough after CPT. Pt maintaining O2 sats of around 92-93% on 50% VM. Pt doesn't appear to be in any respiratory distress at this time. RT will continue to monitor.

## 2013-10-16 NOTE — Progress Notes (Addendum)
Advanced Heart Failure Rounding Note   Subjective:     [name] is a 79 yo male with a hx of HTN, severe anxiety, and OSA who presented to the ED 10/05/13 with a STEMI and was taken to the cath lab. Cath revealed proximally occluded LAD - after flow was restored it was apparently there was extensive LAD disease as well as severe 90-95% ostial D1 disease . There was moderate to severe focal disease of the sub-branches as well. Balloon angioplasty was performed, but no stent was placed. TIMI 2 flow was noted in the LAD and an IABP was placed. He was then evaluated by TCTS and was taken for a CABG x2 LIMA-LAD; SVG-DIAG on 10/08/13. After CABG he was brought back to the ICU still on IABP and multiple pressors. The balloon pump was weaned off 10/10/13 and he was extubated 10/13/13. Patient developed transient afib on 11/11 and was started on Amiodarone gtt.   11/11/4 Venous doppler acute deep DVT involving R peroneal vein  POD # 8 Continues on milrinone 0.2 mcg, dopamine 3 mcg, lasix gtt 10 mg/hr, heparin 1450 and amiodarone 30 mg/hr. His weight is up 27 lbs since surgery. 24 hr I/O +2.8 liters. Remains SOB and on 50% venti mask. ECHO 10/15/13 EF 30-35%. Cr is elevated from pre-op 1.3 to now 2.07. CVP 12. Co-ox 67%. Is on IVF at 50cc/hr    Objective:   Weight Range:  Vital Signs:   Temp:  [97.8 F (36.6 C)-98.5 F (36.9 C)] 98.3 F (36.8 C) (11/12 1521) Pulse Rate:  [74-94] 74 (11/12 1500) Resp:  [13-28] 16 (11/12 1500) BP: (96-131)/(45-92) 117/51 mmHg (11/12 1500) SpO2:  [87 %-97 %] 91 % (11/12 1532) FiO2 (%):  [50 %] 50 % (11/12 1532) Weight:  [277 lb 12.5 oz (126 kg)] 277 lb 12.5 oz (126 kg) (11/12 0500) Last BM Date: 10/16/13  Weight change: Filed Weights   10/14/13 0700 10/15/13 0500 10/16/13 0500  Weight: 274 lb 7.6 oz (124.5 kg) 272 lb 14.9 oz (123.8 kg) 277 lb 12.5 oz (126 kg)    Intake/Output:   Intake/Output Summary (Last 24 hours) at 10/16/13 1602 Last data filed at  10/16/13 1500  Gross per 24 hour  Intake 3005.82 ml  Output    815 ml  Net 2190.82 ml     Physical Exam: General:  Chronically ill appearing. Remains on venti-mask; lying in bed HEENT: normal Neck: supple. JVP to ear. Carotids 2+ bilat; no bruits. No lymphadenopathy or thryomegaly appreciated. Cor: PMI nondisplaced. Regular rate & rhythm. No rubs, gallops or murmurs. Lungs: clear Abdomen: soft, nontender, +distended. No hepatosplenomegaly. No bruits or masses. Good bowel sounds. Extremities: no cyanosis, clubbing, rash, 1-2 + bilateral edema Neuro: alert & orientedx3, cranial nerves grossly intact. moves all 4 extremities w/o difficulty. Affect pleasant  Telemetry: SR 70s  Labs: Basic Metabolic Panel:  Recent Labs Lab 10/14/13 0500 10/15/13 0100 10/15/13 0104 10/15/13 1500 10/16/13 0404  NA 143 142 127* 142 143  K 3.3* 3.1* 2.6* 3.2* 3.2*  CL 101 103 85* 104 103  CO2 25 26 22 28 26   GLUCOSE 118* 123* 670* 125* 126*  BUN 47* 59* 51* 64* 68*  CREATININE 1.15 1.73* 0.50 1.95* 2.07*  CALCIUM 7.7* 8.4 7.6* 8.3* 8.1*    Liver Function Tests:  Recent Labs Lab 10/13/13 0400 10/14/13 0500 10/15/13 0100 10/15/13 0104 10/16/13 0404  AST 32 43* 44* 33 44*  ALT 38 42 37 32 47  ALKPHOS 62 73 62  52 70  BILITOT 2.0* 2.0* 1.5* 1.3* 1.7*  PROT 5.2* 5.7* 5.4* 4.6* 5.8*  ALBUMIN 2.1* 2.5* 2.7* 2.2* 2.8*   No results found for this basename: LIPASE, AMYLASE,  in the last 168 hours No results found for this basename: AMMONIA,  in the last 168 hours  CBC:  Recent Labs Lab 10/13/13 0400 10/13/13 1646 10/14/13 0500 10/15/13 0100 10/15/13 0104 10/16/13 0404  WBC 7.1  --  10.3 10.6* 8.9 11.3*  HGB 9.1* 8.8* 9.4* 8.0* 6.9* 9.5*  HCT 27.0* 26.0* 28.4* 24.4* 21.6* 28.7*  MCV 91.8  --  93.1 93.1 96.0 91.1  PLT 248  --  369 383 363 461*    Cardiac Enzymes: No results found for this basename: CKTOTAL, CKMB, CKMBINDEX, TROPONINI,  in the last 168 hours  BNP: BNP (last 3  results)  Recent Labs  10/07/13 0400  PROBNP 3092.0*    Imaging: Dg Chest Port 1 View  10/16/2013   CLINICAL DATA:  Atelectasis.  EXAM: PORTABLE CHEST - 1 VIEW  COMPARISON:  10/15/2013.  FINDINGS: Cardiomegaly. Mild pulmonary vascular prominence. Prior CABG. Bilateral alveolar infiltrates present. Small bilateral pleural effusions cannot be excluded. These findings are consistent with congestive heart failure with pulmonary edema. Persistent bibasilar atelectasis. No pneumothorax. Stable PICC line with tip in good anatomic position.  IMPRESSION: 1. Congestive heart failure with bilateral pulmonary edema and small pleural effusions. Prior CABG. 2. Persistent basilar atelectasis.  Low lung volumes. 3. PICC line in stable position.   Electronically Signed   By: Maisie Fus  Register   On: 10/16/2013 07:41   Dg Chest Port 1 View  10/15/2013   CLINICAL DATA:  Coronary artery bypass grafting.  EXAM: PORTABLE CHEST - 1 VIEW  COMPARISON:  10/14/2013.  FINDINGS: Interval removal of left IJ sheath. Right upper extremity PICC is present with the tip unchanged. Median sternotomy/ CABG. Basilar atelectasis. Allowing for low lung volumes, no airspace disease or effusion. Cardiopericardial silhouette mildly enlarged.  IMPRESSION: 1. Interval removal of left IJ vascular sheath. Unchanged right upper extremity PICC. 2. Low volume chest with basilar atelectasis.   Electronically Signed   By: Andreas Newport M.D.   On: 10/15/2013 07:59      Medications:     Scheduled Medications: . aspirin EC  325 mg Oral Daily  . atorvastatin  80 mg Oral q1800  . bisacodyl  10 mg Oral Daily   Or  . bisacodyl  10 mg Rectal Daily  . budesonide (PULMICORT) nebulizer solution  0.25 mg Nebulization BID  . cefTAZidime (FORTAZ)  IV  1 g Intravenous Q8H  . docusate sodium  200 mg Oral Daily  . feeding supplement (ENSURE)  1 Container Oral TID BM  . insulin aspart  0-24 Units Subcutaneous Q4H  . levalbuterol  1.25 mg Nebulization  Q6H  . pantoprazole (PROTONIX) IV  40 mg Intravenous Q24H  . sodium chloride  3 mL Intravenous Q12H     Infusions: . sodium chloride 50 mL/hr at 10/16/13 1500  . amiodarone (NEXTERONE PREMIX) 360 mg/200 mL dextrose 30.06 mg/hr (10/16/13 1500)  . DOPamine 2.999 mcg/kg/min (10/16/13 1500)  . furosemide (LASIX) infusion 10 mg/hr (10/16/13 1500)  . heparin 1,450 Units/hr (10/16/13 1500)  . milrinone 0.201 mcg/kg/min (10/16/13 1500)     PRN Medications:  ALPRAZolam, metoprolol, ondansetron (ZOFRAN) IV, oxyCODONE, RESOURCE THICKENUP CLEAR, sodium chloride, sodium chloride, traMADol   Assessment:   1) CAD   S/p CABG x 2 10/08/13 2) STEMI 3) A/C systolic HF  EF 30-35% 4) HTN 5) OSA 6) Severe Anxiety 7) Respiratory failure,   -- vent-mask 50% 8) Afib 9) AKI 10) DVT   -- R peroneal vein   -- on heparin and coumadin  Plan/Discussion:    Length of Stay: 11 Aundria Rud 10/16/2013, 4:02 PM  Advanced Heart Failure Team Pager [phone] (M-F; 7a - 4p)  Please contact  Cardiology for night-coverage after hours (4p -7a ) and weekends on [email or web address]   Patient seen and examined with Ulla Potash, NP. We discussed all aspects of the encounter. I agree with the assessment and plan as stated above. Difficult situation. He is markedly volume overloaded (~30 pounds) but now has worsening renal function and marginal urine output on lasix 10/hr. Co-ox is 67% on inotropic support suggesting adequate cardiac output. Surprisingly serum sodium is actually climbing.  At this point will concentrate all drips, increase lasix to 20/hr and add metolazone 5 bid. We will also check BMET and Una. If urine output not improving or renal function getting worse will need to consider CVVHD or SCUF. I have d/w patient and family and also with Dr. Arrie Aran (Renal) who will see tonight.  We will change heparin to Lovenox and switch iv amio to po to help limit fluid intake.   Check TSH and Cortisol  per Dr. Arrie Aran.   No ACE or b-blocker at this point due to renal failure and decompensation.   The patient is critically ill with multiple organ systems failure and requires high complexity decision making for assessment and support, frequent evaluation and titration of therapies, application of advanced monitoring technologies and extensive interpretation of multiple databases.   Critical Care Time devoted to patient care services described in this note is 45 Minutes.  Daniel Bensimhon,MD 6:03 PM

## 2013-10-16 NOTE — Progress Notes (Signed)
Rehab Admissions Coordinator Note:  Patient was screened by Trish Mage for appropriateness for an Inpatient Acute Rehab Consult.  At this time, we are recommending Inpatient Rehab consult.  Trish Mage 10/16/2013, 2:30 PM  I can be reached at [phone].

## 2013-10-17 ENCOUNTER — Inpatient Hospital Stay (HOSPITAL_COMMUNITY): Payer: Medicare Other

## 2013-10-17 LAB — CARBOXYHEMOGLOBIN
Carboxyhemoglobin: 1.6 % — ABNORMAL HIGH (ref 0.5–1.5)
Methemoglobin: 1.6 % — ABNORMAL HIGH (ref 0.0–1.5)
O2 Saturation: 63.3 %
Total hemoglobin: 10.3 g/dL — ABNORMAL LOW (ref 13.5–18.0)

## 2013-10-17 LAB — CBC
HCT: 29.9 % — ABNORMAL LOW (ref 39.0–52.0)
Hemoglobin: 10.2 g/dL — ABNORMAL LOW (ref 13.0–17.0)
MCH: 30.9 pg (ref 26.0–34.0)
MCHC: 34.1 g/dL (ref 30.0–36.0)
MCV: 90.6 fL (ref 78.0–100.0)
Platelets: 445 10*3/uL — ABNORMAL HIGH (ref 150–400)
RBC: 3.3 MIL/uL — ABNORMAL LOW (ref 4.22–5.81)
RDW: 15.6 % — ABNORMAL HIGH (ref 11.5–15.5)
WBC: 9.7 10*3/uL (ref 4.0–10.5)

## 2013-10-17 LAB — COMPREHENSIVE METABOLIC PANEL
ALT: 43 U/L (ref 0–53)
AST: 33 U/L (ref 0–37)
Albumin: 2.7 g/dL — ABNORMAL LOW (ref 3.5–5.2)
Alkaline Phosphatase: 74 U/L (ref 39–117)
BUN: 71 mg/dL — ABNORMAL HIGH (ref 6–23)
CO2: 27 mEq/L (ref 19–32)
Calcium: 8.2 mg/dL — ABNORMAL LOW (ref 8.4–10.5)
Chloride: 103 mEq/L (ref 96–112)
Creatinine, Ser: 2.14 mg/dL — ABNORMAL HIGH (ref 0.50–1.35)
GFR calc Af Amer: 33 mL/min — ABNORMAL LOW (ref >90)
GFR calc non Af Amer: 28 mL/min — ABNORMAL LOW (ref >90)
Glucose, Bld: 112 mg/dL — ABNORMAL HIGH (ref 70–99)
Potassium: 2.9 mEq/L — ABNORMAL LOW (ref 3.5–5.1)
Sodium: 143 mEq/L (ref 135–145)
Total Bilirubin: 1.3 mg/dL — ABNORMAL HIGH (ref 0.3–1.2)
Total Protein: 6 g/dL (ref 6.0–8.3)

## 2013-10-17 LAB — GLUCOSE, CAPILLARY
Glucose-Capillary: 113 mg/dL — ABNORMAL HIGH (ref 70–99)
Glucose-Capillary: 120 mg/dL — ABNORMAL HIGH (ref 70–99)
Glucose-Capillary: 108 mg/dL — ABNORMAL HIGH (ref 70–99)
Glucose-Capillary: 112 mg/dL — ABNORMAL HIGH (ref 70–99)

## 2013-10-17 LAB — RENAL FUNCTION PANEL
CO2: 29 mEq/L (ref 19–32)
Calcium: 8.4 mg/dL (ref 8.4–10.5)
Chloride: 103 mEq/L (ref 96–112)
Creatinine, Ser: 2.16 mg/dL — ABNORMAL HIGH (ref 0.50–1.35)
GFR calc Af Amer: 32 mL/min — ABNORMAL LOW (ref >90)
GFR calc non Af Amer: 28 mL/min — ABNORMAL LOW (ref >90)
Phosphorus: 3.6 mg/dL (ref 2.3–4.6)
Sodium: 144 mEq/L (ref 135–145)

## 2013-10-17 LAB — PRO B NATRIURETIC PEPTIDE: Pro B Natriuretic peptide (BNP): 30734 pg/mL — ABNORMAL HIGH (ref 0–450)

## 2013-10-17 LAB — TSH: TSH: 0.385 u[IU]/mL (ref 0.350–4.500)

## 2013-10-17 LAB — HEPARIN LEVEL (UNFRACTIONATED): Heparin Unfractionated: 0.48 IU/mL (ref 0.30–0.70)

## 2013-10-17 MED ORDER — DEXTROSE 5 % IV SOLN
1.0000 g | Freq: Two times a day (BID) | INTRAVENOUS | Status: AC
  Administered 2013-10-17 – 2013-10-18 (×2): 1 g via INTRAVENOUS
  Filled 2013-10-17 (×2): qty 1

## 2013-10-17 MED ORDER — POTASSIUM CHLORIDE CRYS ER 20 MEQ PO TBCR
40.0000 meq | EXTENDED_RELEASE_TABLET | Freq: Once | ORAL | Status: AC
  Administered 2013-10-17: 40 meq via ORAL
  Filled 2013-10-17: qty 2

## 2013-10-17 MED ORDER — POTASSIUM CHLORIDE CRYS ER 20 MEQ PO TBCR
40.0000 meq | EXTENDED_RELEASE_TABLET | Freq: Two times a day (BID) | ORAL | Status: DC
  Administered 2013-10-17 (×2): 40 meq via ORAL
  Filled 2013-10-17 (×4): qty 2

## 2013-10-17 MED ORDER — LEVALBUTEROL HCL 1.25 MG/0.5ML IN NEBU
1.2500 mg | INHALATION_SOLUTION | Freq: Four times a day (QID) | RESPIRATORY_TRACT | Status: DC | PRN
  Filled 2013-10-17: qty 0.5

## 2013-10-17 MED ORDER — WARFARIN SODIUM 2.5 MG PO TABS
2.5000 mg | ORAL_TABLET | Freq: Every day | ORAL | Status: DC
  Administered 2013-10-17 – 2013-10-20 (×4): 2.5 mg via ORAL
  Filled 2013-10-17 (×5): qty 1

## 2013-10-17 MED ORDER — LEVALBUTEROL HCL 1.25 MG/0.5ML IN NEBU
1.2500 mg | INHALATION_SOLUTION | Freq: Three times a day (TID) | RESPIRATORY_TRACT | Status: DC
  Administered 2013-10-18 – 2013-10-25 (×20): 1.25 mg via RESPIRATORY_TRACT
  Filled 2013-10-17 (×26): qty 0.5

## 2013-10-17 MED ORDER — WARFARIN - PHYSICIAN DOSING INPATIENT
Freq: Every day | Status: DC
  Administered 2013-10-17: 18:00:00

## 2013-10-17 NOTE — Progress Notes (Signed)
Speech Language Pathology Treatment: Dysphagia  Patient Details Name: Antonio Bernard MRN: [id number] DOB: March 07, 1933 Today's Date: 10/17/2013 Time: 8295-6213 SLP Time Calculation (min): 20 min  Assessment / Plan / Recommendation Clinical Impression  Treatment focused on diagnostic po trials for potential upgrade. Patient alert, improved vocal quality and strength of cough. Patient able to transition to nasal cannula for treatment with intact ability to maintain O2 saturation levels. Self fed clinician provided po trials without overt s/s of aspiration, min cueing for small single sips. Patient judged safe to advance diet. Educated on need to continue with aspiration precautions, particularly those to control SOB which is primary contributing factor to increased aspiration risk at this time. SLP will f/u briefly to ensure tolerance and for f/u education if needed.    HPI HPI: 51 yom admitted through ED on 11/1 with dx of anterior STEMI; cardiac cath on 11/2; CABG 11/4; intubated 11/4 - 11/9.       SLP Plan  Continue with current plan of care    Recommendations Diet recommendations: Regular;Thin liquid Liquids provided via: Cup;Straw Medication Administration: Whole meds with liquid Supervision: Patient able to self feed;Intermittent supervision to cue for compensatory strategies Compensations: Slow rate;Small sips/bites (take frequent breaks to control SOB) Postural Changes and/or Swallow Maneuvers: Seated upright 90 degrees              Oral Care Recommendations: Oral care BID Follow up Recommendations: None Plan: Continue with current plan of care    GO   [facility] MA, CCC-SLP [phone]   Antonio Bernard Antonio Bernard 10/17/2013, 4:17 PM

## 2013-10-17 NOTE — Progress Notes (Signed)
Advanced Heart Failure Rounding Note   Subjective:     Mr. Zuercher is a 79 yo male with a hx of HTN, severe anxiety, and OSA who presented to the ED 10/05/13 with a STEMI and was taken to the cath lab. Cath revealed proximally occluded LAD - after flow was restored it was apparently there was extensive LAD disease as well as severe 90-95% ostial D1 disease . There was moderate to severe focal disease of the sub-branches as well. Balloon angioplasty was performed, but no stent was placed. TIMI 2 flow was noted in the LAD and an IABP was placed. He was then evaluated by TCTS and was taken for a CABG x2 LIMA-LAD; SVG-DIAG on 10/08/13. After CABG he was brought back to the ICU still on IABP and multiple pressors. The balloon pump was weaned off 10/10/13 and he was extubated 10/13/13. Patient developed transient afib on 11/11 and was started on Amiodarone gtt.   11/11/4 Venous doppler acute deep DVT involving R peroneal vein  POD # 9 Lasix increased to 20/hr and metolazone added. Urine output markedly increased. Breathing better. Feels less edematous. Weight only down 2 pounds  Cr 2.1->1.6->2.1  K 2.9 pBNP 30K Co-ox 63%  Objective:   Weight Range:  Vital Signs:   Temp:  [97.7 F (36.5 C)-98.4 F (36.9 C)] 98 F (36.7 C) (11/13 0430) Pulse Rate:  [73-94] 80 (11/13 0500) Resp:  [15-25] 18 (11/13 0500) BP: (109-130)/(48-72) 121/56 mmHg (11/13 0500) SpO2:  [91 %-97 %] 93 % (11/13 0500) FiO2 (%):  [50 %] 50 % (11/13 0400) Weight:  [125.2 kg (276 lb 0.3 oz)] 125.2 kg (276 lb 0.3 oz) (11/13 0500) Last BM Date: 10/16/13  Weight change: Filed Weights   10/15/13 0500 10/16/13 0500 10/17/13 0500  Weight: 123.8 kg (272 lb 14.9 oz) 126 kg (277 lb 12.5 oz) 125.2 kg (276 lb 0.3 oz)    Intake/Output:   Intake/Output Summary (Last 24 hours) at 10/17/13 0559 Last data filed at 10/17/13 0500  Gross per 24 hour  Intake 1962.39 ml  Output   2620 ml  Net -657.61 ml     Physical Exam: General:   Chronically ill appearing. Remains on venti-mask; lying in bed HEENT: normal Neck: supple. JVP to ear. Carotids 2+ bilat; no bruits. No lymphadenopathy or thryomegaly appreciated. Cor: PMI nondisplaced. Regular rate & rhythm. No rubs, gallops or murmurs. Lungs: clear Abdomen: soft, nontender, +distended. No hepatosplenomegaly. No bruits or masses. Good bowel sounds. Extremities: no cyanosis, clubbing, rash, 3-4 + bilateral edema Neuro: alert & orientedx3, cranial nerves grossly intact. moves all 4 extremities w/o difficulty. Affect pleasant  Telemetry: SR 80s  Labs: Basic Metabolic Panel:  Recent Labs Lab 10/15/13 0104 10/15/13 1500 10/16/13 0404 10/16/13 1750 10/17/13 0400  NA 127* 142 143 141 143  K 2.6* 3.2* 3.2* 3.1* 2.9*  CL 85* 104 103 102 103  CO2 22 28 26 25 27   GLUCOSE 670* 125* 126* 164* 112*  BUN 51* 64* 68* 69* 71*  CREATININE 0.50 1.95* 2.07* 1.56* 2.14*  CALCIUM 7.6* 8.3* 8.1* 7.9* 8.2*    Liver Function Tests:  Recent Labs Lab 10/14/13 0500 10/15/13 0100 10/15/13 0104 10/16/13 0404 10/17/13 0400  AST 43* 44* 33 44* 33  ALT 42 37 32 47 43  ALKPHOS 73 62 52 70 74  BILITOT 2.0* 1.5* 1.3* 1.7* 1.3*  PROT 5.7* 5.4* 4.6* 5.8* 6.0  ALBUMIN 2.5* 2.7* 2.2* 2.8* 2.7*   No results found for this basename: LIPASE,  AMYLASE,  in the last 168 hours No results found for this basename: AMMONIA,  in the last 168 hours  CBC:  Recent Labs Lab 10/14/13 0500 10/15/13 0100 10/15/13 0104 10/16/13 0404 10/17/13 0400  WBC 10.3 10.6* 8.9 11.3* 9.7  HGB 9.4* 8.0* 6.9* 9.5* 10.2*  HCT 28.4* 24.4* 21.6* 28.7* 29.9*  MCV 93.1 93.1 96.0 91.1 90.6  PLT 369 383 363 461* 445*    Cardiac Enzymes: No results found for this basename: CKTOTAL, CKMB, CKMBINDEX, TROPONINI,  in the last 168 hours  BNP: BNP (last 3 results)  Recent Labs  10/07/13 0400 10/17/13 0415  PROBNP 3092.0* 30734.0*    Imaging: Dg Chest Port 1 View  10/16/2013   CLINICAL DATA:   Atelectasis.  EXAM: PORTABLE CHEST - 1 VIEW  COMPARISON:  10/15/2013.  FINDINGS: Cardiomegaly. Mild pulmonary vascular prominence. Prior CABG. Bilateral alveolar infiltrates present. Small bilateral pleural effusions cannot be excluded. These findings are consistent with congestive heart failure with pulmonary edema. Persistent bibasilar atelectasis. No pneumothorax. Stable PICC line with tip in good anatomic position.  IMPRESSION: 1. Congestive heart failure with bilateral pulmonary edema and small pleural effusions. Prior CABG. 2. Persistent basilar atelectasis.  Low lung volumes. 3. PICC line in stable position.   Electronically Signed   By: Maisie Fus  Register   On: 10/16/2013 07:41     Medications:     Scheduled Medications: . amiodarone  400 mg Oral BID  . aspirin EC  325 mg Oral Daily  . atorvastatin  80 mg Oral q1800  . bisacodyl  10 mg Oral Daily   Or  . bisacodyl  10 mg Rectal Daily  . budesonide (PULMICORT) nebulizer solution  0.25 mg Nebulization BID  . cefTAZidime (FORTAZ)  IV  1 g Intravenous Q8H  . docusate sodium  200 mg Oral Daily  . enoxaparin (LOVENOX) injection  125 mg Subcutaneous Q12H  . feeding supplement (ENSURE)  1 Container Oral TID BM  . insulin aspart  0-24 Units Subcutaneous Q4H  . levalbuterol  1.25 mg Nebulization Q6H  . metolazone  5 mg Oral BID  . pantoprazole (PROTONIX) IV  40 mg Intravenous Q24H  . sodium chloride  3 mL Intravenous Q12H    Infusions: . sodium chloride Stopped (10/16/13 2000)  . DOPamine 3 mcg/kg/min (10/17/13 0400)  . furosemide (LASIX) infusion 20 mg/hr (10/17/13 0400)  . milrinone 0.2 mcg/kg/min (10/17/13 0400)    PRN Medications: ALPRAZolam, metoprolol, ondansetron (ZOFRAN) IV, oxyCODONE, RESOURCE THICKENUP CLEAR, sodium chloride, sodium chloride, traMADol   Assessment:   1) CAD   S/p CABG x 2 10/08/13 2) STEMI 3) A/C systolic HF    EF 30-35% 4) HTN 5) OSA 6) Severe Anxiety 7) Respiratory failure,   -- vent-mask  50% 8) Afib 9) AKI 10) DVT   -- R peroneal vein   -- on lovenox and coumadin  Plan/Discussion:    Volume status beginning to improve. Cr up but relatively stable. Co-ox good. But Neomia Dear only 11 suggesting that kidneys may be sensing intravascular volume depletion. Will continue lasix drip, metolazone and intoropes for now (he is 30 pounds up). Will need to watch renal function closely. UF still an option  Place TED hose. Supp K+  Continue lovenox for DVT. Continue po amio.   At this point will concentrate all drips, increase lasix to 20/hr and add metolazone 5 bid. We will also check BMET and Una. If urine output not improving or renal function getting worse will need to consider CVVHD  or SCUF. I have d/w patient and family and also with Dr. Arrie Aran (Renal) who will see tonight.  We will change heparin to Lovenox and switch iv amio to po to help limit fluid intake.   Cortisol pending.  No ACE or b-blocker at this point due to renal failure and decompensation.   The patient is critically ill with multiple organ systems failure and requires high complexity decision making for assessment and support, frequent evaluation and titration of therapies, application of advanced monitoring technologies and extensive interpretation of multiple databases.   Critical Care Time devoted to patient care services described in this note is 45 Minutes.  Daniel Bensimhon,MD 5:59 AM

## 2013-10-17 NOTE — Progress Notes (Addendum)
T[facility] DAILY ICU PROGRESS NOTE                   [address]            [place] [address]          [phone]   9 Days Post-Op Procedure(s) (LRB): CORONARY ARTERY BYPASS GRAFTING (CABG) (N/A) INTRAOPERATIVE TRANSESOPHAGEAL ECHOCARDIOGRAM (N/A)  Total Length of Stay:  LOS: 12 days   Subjective: Conversant this morning, joking about wanting a [employer].  Comfortable, no complaints. Breathing stable.   Objective: Vital signs in last 24 hours: Temp:  [97.6 F (36.4 C)-98.4 F (36.9 C)] 97.6 F (36.4 C) (11/13 0730) Pulse Rate:  [73-87] 81 (11/13 0700) Cardiac Rhythm:  [-] Normal sinus rhythm (11/13 0600) Resp:  [16-25] 20 (11/13 0700) BP: (110-130)/(50-72) 124/59 mmHg (11/13 0700) SpO2:  [91 %-97 %] 95 % (11/13 0700) FiO2 (%):  [50 %] 50 % (11/13 0600) Weight:  [276 lb 0.3 oz (125.2 kg)] 276 lb 0.3 oz (125.2 kg) (11/13 0500)  Filed Weights   10/15/13 0500 10/16/13 0500 10/17/13 0500  Weight: 272 lb 14.9 oz (123.8 kg) 277 lb 12.5 oz (126 kg) 276 lb 0.3 oz (125.2 kg)    Weight change: -1 lb 12.2 oz (-0.8 kg)   Hemodynamic parameters for last 24 hours: CVP:  [12 mmHg-18 mmHg] 14 mmHg  Intake/Output from previous day: 11/12 0701 - 11/13 0700 In: 1842.4 [I.V.:1592.4; IV Piggyback:250] Out: 3125 [Urine:3125]  CBGs  [id number]    Current Meds: Scheduled Meds: . amiodarone  400 mg Oral BID  . aspirin EC  325 mg Oral Daily  . atorvastatin  80 mg Oral q1800  . bisacodyl  10 mg Oral Daily   Or  . bisacodyl  10 mg Rectal Daily  . budesonide (PULMICORT) nebulizer solution  0.25 mg Nebulization BID  . cefTAZidime (FORTAZ)  IV  1 g Intravenous Q8H  . docusate sodium  200 mg Oral Daily  . enoxaparin (LOVENOX) injection  125 mg Subcutaneous Q12H  . feeding supplement (ENSURE)  1 Container Oral TID BM  . insulin aspart  0-24 Units Subcutaneous Q4H  . levalbuterol  1.25 mg Nebulization Q6H  . metolazone  5 mg Oral BID  . pantoprazole (PROTONIX)  IV  40 mg Intravenous Q24H  . potassium chloride  40 mEq Oral Once  . potassium chloride  40 mEq Oral BID  . sodium chloride  3 mL Intravenous Q12H   Continuous Infusions: . sodium chloride Stopped (10/16/13 2000)  . DOPamine 3 mcg/kg/min (10/17/13 0400)  . furosemide (LASIX) infusion 20 mg/hr (10/17/13 0400)  . milrinone 0.2 mcg/kg/min (10/17/13 0400)   PRN Meds:.ALPRAZolam, metoprolol, ondansetron (ZOFRAN) IV, oxyCODONE, RESOURCE THICKENUP CLEAR, sodium chloride, sodium chloride, traMADol   Physical Exam: General appearance: alert, cooperative and no distress Heart: regular rate and rhythm Lungs: Crackles in bases bilaterally Extremities: +LE edema Wound: Clean and dry  Lab Results: CBC: Recent Labs  10/16/13 0404 10/17/13 0400  WBC 11.3* 9.7  HGB 9.5* 10.2*  HCT 28.7* 29.9*  PLT 461* 445*   BMET:  Recent Labs  10/16/13 1750 10/17/13 0400  NA 141 143  K 3.1* 2.9*  CL 102 103  CO2 25 27  GLUCOSE 164* 112*  BUN 69* 71*  CREATININE 1.56* 2.14*  CALCIUM 7.9* 8.2*    PT/INR: No results found for this basename: LABPROT, INR,  in the last 72 hours Radiology: Dg Chest Port 1 View  10/17/2013  CLINICAL DATA:  Myocardial infarction.  EXAM: PORTABLE CHEST - 1 VIEW  COMPARISON:  10/16/2013  FINDINGS: Right-sided PICC line is unchanged. Prior median sternotomy. Cardiomegaly accentuated by AP portable technique. Improved to resolved right pleural effusion. Small left pleural effusion remains. Numerous leads and wires project over the chest. No pneumothorax. Improved mild interstitial edema. Left greater than right bibasilar airspace disease is similar to minimally improved.  IMPRESSION: Improved aeration, with decreased interstitial edema and decreased to resolved right pleural effusion.  Patchy bibasilar airspace disease and small left pleural effusion remain.   Electronically Signed   By: Jeronimo Greaves M.D.   On: 10/17/2013 07:36   Dg Chest Port 1 View  10/16/2013    CLINICAL DATA:  Atelectasis.  EXAM: PORTABLE CHEST - 1 VIEW  COMPARISON:  10/15/2013.  FINDINGS: Cardiomegaly. Mild pulmonary vascular prominence. Prior CABG. Bilateral alveolar infiltrates present. Small bilateral pleural effusions cannot be excluded. These findings are consistent with congestive heart failure with pulmonary edema. Persistent bibasilar atelectasis. No pneumothorax. Stable PICC line with tip in good anatomic position.  IMPRESSION: 1. Congestive heart failure with bilateral pulmonary edema and small pleural effusions. Prior CABG. 2. Persistent basilar atelectasis.  Low lung volumes. 3. PICC line in stable position.   Electronically Signed   By: Maisie Fus  Register   On: 10/16/2013 07:41     Assessment/Plan: S/P Procedure(s) (LRB): CORONARY ARTERY BYPASS GRAFTING (CABG) (N/A) INTRAOPERATIVE TRANSESOPHAGEAL ECHOCARDIOGRAM (N/A)  CV- Maintaining SR, BPs improved.  B-blocker and ACE-I on hold for now.  Acute/chronic systolic HF/ARF - appreciate nephrology/Heart Failure team's input.  Will continue Lasix gtt, Metalozone, Zaroxylyn, Dopamine/Milrinone gtt.  Cr up this am, but UOP significantly improved after Lasix gtt resumed.    Hypokalemia- replace K+.  Acute blood loss anemia - H/H stable.  L peroneal DVT- Continue Lovenox.  Will start low dose Coumadin.  Pulm- D#9 Elita Quick for presumed tracheobronchitis.  WBC stable and pt afebrile.  Will complete 10 day course and d/c abx.  Mobilize as tolerated - Continue PT.  CIR recommended at d/c.  He overall looks better today.  Continue current care.   COLLINS,GINA H 10/17/2013 7:42 AM  I have seen and examined the patient and agree with the assessment and plan as outlined.  Uzoma Vivona H 10/17/2013 8:19 AM

## 2013-10-17 NOTE — Progress Notes (Signed)
Orders clarified with Dr Gala Romney,  MD ok for TED hose to BLE, can d/c SCD, ok with TED hose only. Ok to ambulated pt as pt tolerates, work with PT as pt able. Will continue to monitor. Koren Bound

## 2013-10-17 NOTE — Progress Notes (Signed)
Physical Therapy Treatment Patient Details Name: Antonio Bernard MRN: [id number] DOB: 05/24/1933 Today's Date: 10/17/2013 Time: 4098-1191 PT Time Calculation (min): 48 min  PT Assessment / Plan / Recommendation  History of Present Illness Adm 11/01 with MI; 11/05 CABG x 2 and remained on vent until 11/09   PT Comments   Pt continues to make steady progress and is highly motivated. Surprisingly walked 34 feet today (after up to [facility] and fatigued). Continues to need frequent cues to prevent him from using his UEs to push and pull when mobilizing.    Follow Up Recommendations  CIR;Supervision/Assistance - 24 hour     Does the patient have the potential to tolerate intense rehabilitation     Barriers to Discharge        Equipment Recommendations  None recommended by PT    Recommendations for Other Services Rehab consult;OT consult  Frequency Min 3X/week   Progress towards PT Goals    Plan      Precautions / Restrictions Precautions Precautions: Sternal;Fall Precaution Comments: pt able to state precautions; requires cues to adhere  Restrictions Weight Bearing Restrictions: No   Pertinent Vitals/Pain FM O2 while walking; SaO2 monitor with poor waveform during ambulation with ?decr. Upon sitting to rest, within 60 seconds was registering 93%. BP 117/69 after transfer to [facility]    Mobility  Bed Mobility Bed Mobility: Supine to Sit;Sitting - Scoot to Edge of Bed Supine to Sit: 1: +2 Total assist;HOB elevated Supine to Sit: Patient Percentage: 40% Sitting - Scoot to Edge of Bed: 3: Mod assist Details for Bed Mobility Assistance: Pt on FM for decr SaO2 therefore progressed to EOB with HOB up to 50. Discussed procedure he will ultimately use. Transfers Transfers: Sit to Stand;Stand to Sit Sit to Stand: 1: +2 Total assist Sit to Stand: Patient Percentage: 80% Stand to Sit: 1: +2 Total assist Stand to Sit: Patient Percentage: 80% Details for Transfer Assistance: stood x 3;   Ambulation/Gait Ambulation/Gait Assistance: 1: +2 Total assist (pivotal steps Lt then Rt to/from BSC; legs weak) Ambulation/Gait: Patient Percentage: 90% Ambulation Distance (Feet): 34 Feet Assistive device: Other (Comment) (push w/c) Ambulation/Gait Assistance Details: +2 for lines and to follow with chair due to tenuous respiratory status Gait Pattern: Step-through pattern;Decreased stride length;Wide base of support    Exercises     PT Diagnosis:    PT Problem List:   PT Treatment Interventions:     PT Goals (current goals can now be found in the care plan section) Acute Rehab PT Goals Patient Stated Goal: "get back to the pulpit" PT Goal Formulation: With patient Time For Goal Achievement: 10/23/13 Potential to Achieve Goals: Good  Visit Information  Last PT Received On: 10/17/13 Assistance Needed: +2 History of Present Illness: Adm 11/01 with MI; 11/05 CABG x 2 and remained on vent until 11/09    Subjective Data  Patient Stated Goal: "get back to the pulpit"   Cognition  Cognition Arousal/Alertness: Awake/alert Behavior During Therapy: WFL for tasks assessed/performed Overall Cognitive Status: Within Functional Limits for tasks assessed    Balance  Balance Balance Assessed: Yes Static Sitting Balance Static Sitting - Balance Support: No upper extremity supported;Feet supported Static Sitting - Level of Assistance: 5: Stand by assistance Static Standing Balance Static Standing - Balance Support: Bilateral upper extremity supported Static Standing - Level of Assistance: 1: +2 Total assist (pt 90%; legs shakey)  End of Session PT - End of Session Equipment Utilized During Treatment: Oxygen Activity Tolerance: Patient tolerated  treatment well Patient left: in chair;with call bell/phone within reach Nurse Communication: Mobility status   GP     Mccartney Brucks 10/17/2013, 2:04 PM Pager [phone]

## 2013-10-17 NOTE — Progress Notes (Signed)
Subjective:  Is making about 250 ccs of urine per hour on current meds.  BP stable, creatinine up slightly.  Patient thinks he is breathing better Objective Vital signs in last 24 hours: Filed Vitals:   10/17/13 0700 10/17/13 0730 10/17/13 0800 10/17/13 0844  BP: 124/59  121/58   Pulse: 81  83   Temp:  97.6 F (36.4 C)    TempSrc:  Oral    Resp: 20  20   Height:      Weight:      SpO2: 95%  93% 63%   Weight change: -0.8 kg (-1 lb 12.2 oz)  Intake/Output Summary (Last 24 hours) at 10/17/13 9604 Last data filed at 10/17/13 0800  Gross per 24 hour  Intake 1658.9 ml  Output   3305 ml  Net -1646.1 ml    Assessment/ Plan: Pt is a 79 y.o. yo male who was admitted on 10/05/2013 with  [place] req PTCA and then CABG with IABP with decompensated heart failure and  A on CRF  1. AKI in setting of acute decompensated systolic CHF- Doing fairly well with increased lasix gtt to 20mg /hr and added zaroxolyn 10 per day. Great  UOP. No indication for urgent SCUF at this point . Would leave meds as is and we  will continue to follow along. Could also consider weaning off of dopamine as BP allows. Slight worsening in kidney function today but hopefully will stabilize.  2. STEMI s/p CABG x 2. On milrinone/dopa/amio/lipitor/metoprolol. Plan per CT surgery and Cardiology 3. Acute systolic CHF (EF 54-09%) as above. Hopefully he will continue to respond to increased diuretics. Hold off on SCUF for now 4. COPD/OSA- on xopenex  5. Hypokalemia- replete and follow- I will check another level this PM 6. Hyponatremia- improved with diuresis. 7. Hyperglycemia- insulin per primary svc 8. ABLA- follow H/H and transfuse as needed. Currently stable    Stefanny Pieri A    Labs: Basic Metabolic Panel:  Recent Labs Lab 10/16/13 0404 10/16/13 1750 10/17/13 0400  NA 143 141 143  K 3.2* 3.1* 2.9*  CL 103 102 103  CO2 26 25 27   GLUCOSE 126* 164* 112*  BUN 68* 69* 71*  CREATININE 2.07* 1.56* 2.14*   CALCIUM 8.1* 7.9* 8.2*   Liver Function Tests:  Recent Labs Lab 10/15/13 0104 10/16/13 0404 10/17/13 0400  AST 33 44* 33  ALT 32 47 43  ALKPHOS 52 70 74  BILITOT 1.3* 1.7* 1.3*  PROT 4.6* 5.8* 6.0  ALBUMIN 2.2* 2.8* 2.7*   No results found for this basename: LIPASE, AMYLASE,  in the last 168 hours No results found for this basename: AMMONIA,  in the last 168 hours CBC:  Recent Labs Lab 10/14/13 0500 10/15/13 0100 10/15/13 0104 10/16/13 0404 10/17/13 0400  WBC 10.3 10.6* 8.9 11.3* 9.7  HGB 9.4* 8.0* 6.9* 9.5* 10.2*  HCT 28.4* 24.4* 21.6* 28.7* 29.9*  MCV 93.1 93.1 96.0 91.1 90.6  PLT 369 383 363 461* 445*   Cardiac Enzymes: No results found for this basename: CKTOTAL, CKMB, CKMBINDEX, TROPONINI,  in the last 168 hours CBG:  Recent Labs Lab 10/16/13 1523 10/16/13 1945 10/16/13 2307 10/17/13 0428 10/17/13 0734  GLUCAP 109* 123* 109* 113* 120*    Iron Studies: No results found for this basename: IRON, TIBC, TRANSFERRIN, FERRITIN,  in the last 72 hours Studies/Results: Dg Chest Port 1 View  10/17/2013   CLINICAL DATA:  Myocardial infarction.  EXAM: PORTABLE CHEST - 1 VIEW  COMPARISON:  10/16/2013  FINDINGS: Right-sided PICC line is unchanged. Prior median sternotomy. Cardiomegaly accentuated by AP portable technique. Improved to resolved right pleural effusion. Small left pleural effusion remains. Numerous leads and wires project over the chest. No pneumothorax. Improved mild interstitial edema. Left greater than right bibasilar airspace disease is similar to minimally improved.  IMPRESSION: Improved aeration, with decreased interstitial edema and decreased to resolved right pleural effusion.  Patchy bibasilar airspace disease and small left pleural effusion remain.   Electronically Signed   By: Jeronimo Greaves M.D.   On: 10/17/2013 07:36   Dg Chest Port 1 View  10/16/2013   CLINICAL DATA:  Atelectasis.  EXAM: PORTABLE CHEST - 1 VIEW  COMPARISON:  10/15/2013.   FINDINGS: Cardiomegaly. Mild pulmonary vascular prominence. Prior CABG. Bilateral alveolar infiltrates present. Small bilateral pleural effusions cannot be excluded. These findings are consistent with congestive heart failure with pulmonary edema. Persistent bibasilar atelectasis. No pneumothorax. Stable PICC line with tip in good anatomic position.  IMPRESSION: 1. Congestive heart failure with bilateral pulmonary edema and small pleural effusions. Prior CABG. 2. Persistent basilar atelectasis.  Low lung volumes. 3. PICC line in stable position.   Electronically Signed   By: Maisie Fus  Register   On: 10/16/2013 07:41   Medications: Infusions: . sodium chloride Stopped (10/16/13 2000)  . DOPamine 3 mcg/kg/min (10/17/13 0800)  . furosemide (LASIX) infusion 20 mg/hr (10/17/13 0800)  . milrinone 0.2 mcg/kg/min (10/17/13 0800)    Scheduled Medications: . amiodarone  400 mg Oral BID  . aspirin EC  325 mg Oral Daily  . atorvastatin  80 mg Oral q1800  . bisacodyl  10 mg Oral Daily   Or  . bisacodyl  10 mg Rectal Daily  . budesonide (PULMICORT) nebulizer solution  0.25 mg Nebulization BID  . cefTAZidime (FORTAZ)  IV  1 g Intravenous Q8H  . docusate sodium  200 mg Oral Daily  . enoxaparin (LOVENOX) injection  125 mg Subcutaneous Q12H  . feeding supplement (ENSURE)  1 Container Oral TID BM  . insulin aspart  0-24 Units Subcutaneous Q4H  . levalbuterol  1.25 mg Nebulization Q6H  . metolazone  5 mg Oral BID  . pantoprazole (PROTONIX) IV  40 mg Intravenous Q24H  . potassium chloride  40 mEq Oral BID  . sodium chloride  3 mL Intravenous Q12H  . warfarin  2.5 mg Oral q1800  . Warfarin - Physician Dosing Inpatient   Does not apply q1800    have reviewed scheduled and prn medications.  Physical Exam: General:on venti mask but able to speak with me- alert, NAD Heart: RRR Lungs: CBS bilat Abdomen: obese, non tender Extremities: pitting throughout    10/17/2013,9:18 AM  LOS: 12 days

## 2013-10-18 DIAGNOSIS — J984 Other disorders of lung: Secondary | ICD-10-CM

## 2013-10-18 LAB — GLUCOSE, CAPILLARY
Glucose-Capillary: 102 mg/dL — ABNORMAL HIGH (ref 70–99)
Glucose-Capillary: 105 mg/dL — ABNORMAL HIGH (ref 70–99)
Glucose-Capillary: 106 mg/dL — ABNORMAL HIGH (ref 70–99)
Glucose-Capillary: 106 mg/dL — ABNORMAL HIGH (ref 70–99)

## 2013-10-18 LAB — COMPREHENSIVE METABOLIC PANEL
ALT: 38 U/L (ref 0–53)
AST: 29 U/L (ref 0–37)
Albumin: 2.6 g/dL — ABNORMAL LOW (ref 3.5–5.2)
Alkaline Phosphatase: 76 U/L (ref 39–117)
BUN: 68 mg/dL — ABNORMAL HIGH (ref 6–23)
CO2: 32 mEq/L (ref 19–32)
Calcium: 8.2 mg/dL — ABNORMAL LOW (ref 8.4–10.5)
Chloride: 100 mEq/L (ref 96–112)
Creatinine, Ser: 2.12 mg/dL — ABNORMAL HIGH (ref 0.50–1.35)
GFR calc Af Amer: 33 mL/min — ABNORMAL LOW (ref >90)
GFR calc non Af Amer: 28 mL/min — ABNORMAL LOW (ref >90)
Glucose, Bld: 105 mg/dL — ABNORMAL HIGH (ref 70–99)
Potassium: 3 mEq/L — ABNORMAL LOW (ref 3.5–5.1)
Sodium: 144 mEq/L (ref 135–145)
Total Bilirubin: 1.3 mg/dL — ABNORMAL HIGH (ref 0.3–1.2)
Total Protein: 5.9 g/dL — ABNORMAL LOW (ref 6.0–8.3)

## 2013-10-18 LAB — BASIC METABOLIC PANEL
BUN: 64 mg/dL — ABNORMAL HIGH (ref 6–23)
Calcium: 8.5 mg/dL (ref 8.4–10.5)
GFR calc non Af Amer: 54 mL/min — ABNORMAL LOW (ref >90)
Glucose, Bld: 141 mg/dL — ABNORMAL HIGH (ref 70–99)
Sodium: 137 mEq/L (ref 135–145)

## 2013-10-18 LAB — CBC
HCT: 32.2 % — ABNORMAL LOW (ref 39.0–52.0)
Hemoglobin: 10.7 g/dL — ABNORMAL LOW (ref 13.0–17.0)
MCH: 30.1 pg (ref 26.0–34.0)
MCHC: 33.2 g/dL (ref 30.0–36.0)
MCV: 90.4 fL (ref 78.0–100.0)
Platelets: 496 10*3/uL — ABNORMAL HIGH (ref 150–400)
RBC: 3.56 MIL/uL — ABNORMAL LOW (ref 4.22–5.81)
RDW: 14.9 % (ref 11.5–15.5)
WBC: 11.2 10*3/uL — ABNORMAL HIGH (ref 4.0–10.5)

## 2013-10-18 LAB — PROTIME-INR
INR: 1.41 (ref 0.00–1.49)
Prothrombin Time: 16.9 seconds — ABNORMAL HIGH (ref 11.6–15.2)

## 2013-10-18 LAB — CARBOXYHEMOGLOBIN
Carboxyhemoglobin: 1.5 % (ref 0.5–1.5)
Methemoglobin: 1.6 % — ABNORMAL HIGH (ref 0.0–1.5)
O2 Saturation: 54.6 %
Total hemoglobin: 10.5 g/dL — ABNORMAL LOW (ref 13.5–18.0)

## 2013-10-18 LAB — HEPARIN LEVEL (UNFRACTIONATED): Heparin Unfractionated: 0.85 IU/mL — ABNORMAL HIGH (ref 0.30–0.70)

## 2013-10-18 MED ORDER — POTASSIUM CHLORIDE 10 MEQ/50ML IV SOLN
10.0000 meq | INTRAVENOUS | Status: DC | PRN
  Administered 2013-10-18: 10 meq via INTRAVENOUS

## 2013-10-18 MED ORDER — PANTOPRAZOLE SODIUM 40 MG PO TBEC
40.0000 mg | DELAYED_RELEASE_TABLET | Freq: Every day | ORAL | Status: DC
  Administered 2013-10-18 – 2013-10-24 (×7): 40 mg via ORAL
  Filled 2013-10-18 (×7): qty 1

## 2013-10-18 MED ORDER — POTASSIUM CHLORIDE 10 MEQ/50ML IV SOLN
INTRAVENOUS | Status: AC
  Filled 2013-10-18: qty 150

## 2013-10-18 MED ORDER — SPIRONOLACTONE 25 MG PO TABS
25.0000 mg | ORAL_TABLET | Freq: Every day | ORAL | Status: DC
  Administered 2013-10-18 – 2013-10-25 (×8): 25 mg via ORAL
  Filled 2013-10-18 (×8): qty 1

## 2013-10-18 MED ORDER — POTASSIUM CHLORIDE 10 MEQ/50ML IV SOLN
10.0000 meq | INTRAVENOUS | Status: AC
  Administered 2013-10-18 (×2): 10 meq via INTRAVENOUS
  Filled 2013-10-18: qty 50

## 2013-10-18 MED ORDER — POTASSIUM CHLORIDE 10 MEQ/50ML IV SOLN
10.0000 meq | INTRAVENOUS | Status: AC
  Administered 2013-10-18 (×5): 10 meq via INTRAVENOUS
  Filled 2013-10-18: qty 50

## 2013-10-18 MED ORDER — POTASSIUM CHLORIDE CRYS ER 20 MEQ PO TBCR
40.0000 meq | EXTENDED_RELEASE_TABLET | Freq: Three times a day (TID) | ORAL | Status: DC
  Administered 2013-10-18 – 2013-10-19 (×6): 40 meq via ORAL
  Filled 2013-10-18 (×9): qty 2

## 2013-10-18 NOTE — Progress Notes (Signed)
Subjective:  Diuresed nearly 6 liters over last 24 hours, creatinine stayed the same.  Looks much better, walked with PT Objective Vital signs in last 24 hours: Filed Vitals:   10/18/13 0600 10/18/13 0700 10/18/13 0730 10/18/13 0800  BP: 111/58 113/58  114/58  Pulse: 77 76  97  Temp:      TempSrc:      Resp: 13 12  17   Height:      Weight:      SpO2: 96% 96% 95% 92%   Weight change: -2.23 kg (-4 lb 14.7 oz)  Intake/Output Summary (Last 24 hours) at 10/18/13 1610 Last data filed at 10/18/13 0800  Gross per 24 hour  Intake 1728.5 ml  Output   7825 ml  Net -6096.5 ml    Assessment/ Plan: Pt is a 79 y.o. yo male who was admitted on 10/05/2013 with  [place] req PTCA and then CABG with IABP with decompensated heart failure and  A on CRF  1. AKI in setting of acute decompensated systolic CHF- Diuresed a ton (maybe too much) on current regimen. Great  UOP. No indication for urgent SCUF at this point . Will change meds to stop zaroxolyn and decrease lasix to 15 mg per hour to aim for 1-2 liters of diuresis over next day. Could also consider weaning off of dopamine as BP allows.  Kidney function fortunately stable.    2. STEMI s/p CABG x 2. On milrinone/dopa/amio/lipitor/metoprolol. Plan per CT surgery and Cardiology 3. Acute systolic CHF (EF 96-04%) as above. Hopefully he will continue to respond to increased diuretics. Hold off on SCUF for now 4. COPD/OSA- on xopenex  5. Hypokalemia- replete and follow- Will increase repletion to 40 TID 6. Hyponatremia- improved with diuresis. 7. Hyperglycemia- insulin per primary svc 8. ABLA- follow H/H and transfuse as needed. Currently stable to improved with diuresis    Antonio Bernard A    Labs: Basic Metabolic Panel:  Recent Labs Lab 10/17/13 0400 10/17/13 1501 10/18/13 0424  NA 143 144 144  K 2.9* 3.2* 3.0*  CL 103 103 100  CO2 27 29 32  GLUCOSE 112* 113* 105*  BUN 71* 72* 68*  CREATININE 2.14* 2.16* 2.12*  CALCIUM 8.2* 8.4 8.2*   PHOS  --  3.6  --    Liver Function Tests:  Recent Labs Lab 10/16/13 0404 10/17/13 0400 10/17/13 1501 10/18/13 0424  AST 44* 33  --  29  ALT 47 43  --  38  ALKPHOS 70 74  --  76  BILITOT 1.7* 1.3*  --  1.3*  PROT 5.8* 6.0  --  5.9*  ALBUMIN 2.8* 2.7* 2.7* 2.6*   No results found for this basename: LIPASE, AMYLASE,  in the last 168 hours No results found for this basename: AMMONIA,  in the last 168 hours CBC:  Recent Labs Lab 10/15/13 0100 10/15/13 0104 10/16/13 0404 10/17/13 0400 10/18/13 0424  WBC 10.6* 8.9 11.3* 9.7 11.2*  HGB 8.0* 6.9* 9.5* 10.2* 10.7*  HCT 24.4* 21.6* 28.7* 29.9* 32.2*  MCV 93.1 96.0 91.1 90.6 90.4  PLT 383 363 461* 445* 496*   Cardiac Enzymes: No results found for this basename: CKTOTAL, CKMB, CKMBINDEX, TROPONINI,  in the last 168 hours CBG:  Recent Labs Lab 10/17/13 1137 10/17/13 1523 10/17/13 1920 10/18/13 0044 10/18/13 0403  GLUCAP 119* 108* 112* 106* 102*    Iron Studies: No results found for this basename: IRON, TIBC, TRANSFERRIN, FERRITIN,  in the last 72 hours Studies/Results: Dg Chest [facility]  1 View  10/17/2013   CLINICAL DATA:  Myocardial infarction.  EXAM: PORTABLE CHEST - 1 VIEW  COMPARISON:  10/16/2013  FINDINGS: Right-sided PICC line is unchanged. Prior median sternotomy. Cardiomegaly accentuated by AP portable technique. Improved to resolved right pleural effusion. Small left pleural effusion remains. Numerous leads and wires project over the chest. No pneumothorax. Improved mild interstitial edema. Left greater than right bibasilar airspace disease is similar to minimally improved.  IMPRESSION: Improved aeration, with decreased interstitial edema and decreased to resolved right pleural effusion.  Patchy bibasilar airspace disease and small left pleural effusion remain.   Electronically Signed   By: Jeronimo Greaves M.D.   On: 10/17/2013 07:36   Medications: Infusions: . sodium chloride 5 mL/hr at 10/18/13 0400  . DOPamine 3  mcg/kg/min (10/18/13 0500)  . furosemide (LASIX) infusion 20 mg/hr (10/18/13 0400)  . milrinone 0.2 mcg/kg/min (10/18/13 0500)    Scheduled Medications: . amiodarone  400 mg Oral BID  . aspirin EC  325 mg Oral Daily  . atorvastatin  80 mg Oral q1800  . bisacodyl  10 mg Oral Daily   Or  . bisacodyl  10 mg Rectal Daily  . budesonide (PULMICORT) nebulizer solution  0.25 mg Nebulization BID  . cefTAZidime (FORTAZ)  IV  1 g Intravenous Q12H  . docusate sodium  200 mg Oral Daily  . enoxaparin (LOVENOX) injection  125 mg Subcutaneous Q12H  . feeding supplement (ENSURE)  1 Container Oral TID BM  . insulin aspart  0-24 Units Subcutaneous Q4H  . levalbuterol  1.25 mg Nebulization TID  . pantoprazole  40 mg Oral Q1200  . potassium chloride  40 mEq Oral TID  . sodium chloride  3 mL Intravenous Q12H  . warfarin  2.5 mg Oral q1800  . Warfarin - Physician Dosing Inpatient   Does not apply q1800    have reviewed scheduled and prn medications.  Physical Exam: General:on venti mask but able to speak with me- alert, NAD Heart: RRR Lungs: CBS bilat Abdomen: obese, non tender Extremities: pitting throughout    10/18/2013,8:38 AM  LOS: 13 days

## 2013-10-18 NOTE — Progress Notes (Addendum)
[facility] DAILY ICU PROGRESS NOTE                   [address]            [place] [address]          [phone]   10 Days Post-Op Procedure(s) (LRB): CORONARY ARTERY BYPASS GRAFTING (CABG) (N/A) INTRAOPERATIVE TRANSESOPHAGEAL ECHOCARDIOGRAM (N/A)  Total Length of Stay:  LOS: 13 days   Subjective: Stable, no complaints this am.  Breathing stable, appetite still poor.  Walked 34 ft with PT yesterday.   Objective: Vital signs in last 24 hours: Temp:  [97.7 F (36.5 C)-98.7 F (37.1 C)] 97.7 F (36.5 C) (11/14 0300) Pulse Rate:  [75-97] 97 (11/14 0800) Cardiac Rhythm:  [-] Normal sinus rhythm (11/14 0600) Resp:  [12-24] 17 (11/14 0800) BP: (97-129)/(48-72) 114/58 mmHg (11/14 0800) SpO2:  [63 %-97 %] 92 % (11/14 0800) FiO2 (%):  [50 %] 50 % (11/13 1512) Weight:  [271 lb 1.6 oz (122.97 kg)] 271 lb 1.6 oz (122.97 kg) (11/14 0500)  Filed Weights   10/16/13 0500 10/17/13 0500 10/18/13 0500  Weight: 277 lb 12.5 oz (126 kg) 276 lb 0.3 oz (125.2 kg) 271 lb 1.6 oz (122.97 kg)    Weight change: -4 lb 14.7 oz (-2.23 kg)   Hemodynamic parameters for last 24 hours: CVP:  [10 mmHg-20 mmHg] 10 mmHg  Intake/Output from previous day: 11/13 0701 - 11/14 0700 In: 1763 [P.O.:720; I.V.:943; IV Piggyback:100] Out: 7575 [Urine:7575]  Intake/Output this shift: Total I/O In: -  Out: 500 [Urine:500]  Current Meds: Scheduled Meds: . amiodarone  400 mg Oral BID  . aspirin EC  325 mg Oral Daily  . atorvastatin  80 mg Oral q1800  . bisacodyl  10 mg Oral Daily   Or  . bisacodyl  10 mg Rectal Daily  . budesonide (PULMICORT) nebulizer solution  0.25 mg Nebulization BID  . cefTAZidime (FORTAZ)  IV  1 g Intravenous Q12H  . docusate sodium  200 mg Oral Daily  . enoxaparin (LOVENOX) injection  125 mg Subcutaneous Q12H  . feeding supplement (ENSURE)  1 Container Oral TID BM  . insulin aspart  0-24 Units Subcutaneous Q4H  . levalbuterol  1.25 mg Nebulization TID  . metolazone   5 mg Oral BID  . pantoprazole  40 mg Oral Q1200  . potassium chloride  40 mEq Oral BID  . sodium chloride  3 mL Intravenous Q12H  . warfarin  2.5 mg Oral q1800  . Warfarin - Physician Dosing Inpatient   Does not apply q1800   Continuous Infusions: . sodium chloride 5 mL/hr at 10/18/13 0400  . DOPamine 3 mcg/kg/min (10/18/13 0500)  . furosemide (LASIX) infusion 20 mg/hr (10/18/13 0400)  . milrinone 0.2 mcg/kg/min (10/18/13 0500)   PRN Meds:.ALPRAZolam, levalbuterol, metoprolol, ondansetron (ZOFRAN) IV, oxyCODONE, RESOURCE THICKENUP CLEAR, sodium chloride, sodium chloride, traMADol   CBGs 112-106-102-105  Physical Exam: General appearance: alert, cooperative and no distress Heart: regular rate and rhythm Lungs: Slightly decreased BS in bases Extremities: Decreasing LE edema Wound: Clean and dry    Lab Results: CBC: Recent Labs  10/17/13 0400 10/18/13 0424  WBC 9.7 11.2*  HGB 10.2* 10.7*  HCT 29.9* 32.2*  PLT 445* 496*   BMET:  Recent Labs  10/17/13 1501 10/18/13 0424  NA 144 144  K 3.2* 3.0*  CL 103 100  CO2 29 32  GLUCOSE 113* 105*  BUN 72* 68*  CREATININE 2.16* 2.12*  CALCIUM 8.4 8.2*    PT/INR:  Recent Labs  10/18/13 0424  LABPROT 16.9*  INR 1.41   Radiology: Dg Chest Port 1 View  10/17/2013   CLINICAL DATA:  Myocardial infarction.  EXAM: PORTABLE CHEST - 1 VIEW  COMPARISON:  10/16/2013  FINDINGS: Right-sided PICC line is unchanged. Prior median sternotomy. Cardiomegaly accentuated by AP portable technique. Improved to resolved right pleural effusion. Small left pleural effusion remains. Numerous leads and wires project over the chest. No pneumothorax. Improved mild interstitial edema. Left greater than right bibasilar airspace disease is similar to minimally improved.  IMPRESSION: Improved aeration, with decreased interstitial edema and decreased to resolved right pleural effusion.  Patchy bibasilar airspace disease and small left pleural effusion  remain.   Electronically Signed   By: Jeronimo Greaves M.D.   On: 10/17/2013 07:36     Assessment/Plan: S/P Procedure(s) (LRB): CORONARY ARTERY BYPASS GRAFTING (CABG) (N/A) INTRAOPERATIVE TRANSESOPHAGEAL ECHOCARDIOGRAM (N/A) CV- Maintaining SR, BPs stable. B-blocker and ACE-I on hold for now.  Acute/chronic systolic HF/ARF - appreciate nephrology/Heart Failure team's input. Continue Lasix gtt, Dopamine/Milrinone gtts. Cr stable, UOP excellent. Hypokalemia- replace K+.  Acute blood loss anemia - H/H stable.  L peroneal DVT- Continue Lovenox, low dose Coumadin.  Pulm- D#10/10 Elita Quick for presumed tracheobronchitis. WBC stable and pt afebrile. Will complete 10 day course and d/c abx.  Mobilize as tolerated - Continue PT. CIR recommended at d/c.  He continues to improve slowly. Continue current care.   COLLINS,GINA H 10/18/2013 8:36 AM   I have seen and examined the patient and agree with the assessment and plan as outlined.  Mr Strozier has made considerable progress over the last 2 days.  Breathing much improved and making slow progress w/ PT.  I/O's nearly 6 liters negative yesterday.  Weight not yet recorded today.  Lasix decreased to 15 mg/hr and zaroxolyn stopped per Nephrology.  Possibly could start to slowly wean milrinone and dopamine soon but coox decreased slightly to 55% this morning.  Will defer to Heart Failure Team.   Purcell Nails 10/18/2013 9:50 AM

## 2013-10-18 NOTE — Progress Notes (Signed)
Chaplain followed up with pt and pt's daughter. 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   Chaplain offered emotional/spiritual support and presence, and explained chaplain availability if they should want support. Family appreciated visit.   Maurene Capes [phone]

## 2013-10-18 NOTE — Progress Notes (Signed)
Physical Therapy Treatment Patient Details Name: Antonio Bernard MRN: [id number] DOB: 12/11/1932 Today's Date: 10/18/2013 Time: 4098-1191 PT Time Calculation (min): 38 min  PT Assessment / Plan / Recommendation  History of Present Illness Adm 11/01 with MI; 11/05 CABG x 2 and remained on vent until 11/09   PT Comments   Pt looked great at beginning of session--now on [place] O2. Pt wanted to sit on BSC prior to ambulating. After standing from [facility], pt reported feeling dizzy, appeared anxious, instructed to sit back down on [facility] and as he started to he passed out and fell forward onto wheelchair. (See additional progress note for details). Pt reporting sore lower sternum after returned to bed via lift. Dr. Cornelius Moras notified.   Follow Up Recommendations  CIR;Supervision/Assistance - 24 hour     Does the patient have the potential to tolerate intense rehabilitation     Barriers to Discharge        Equipment Recommendations  None recommended by PT    Recommendations for Other Services    Frequency Min 3X/week   Progress towards PT Goals Progress towards PT goals: Not progressing toward goals - comment (fainted and fell today)  Plan Current plan remains appropriate    Precautions / Restrictions Precautions Precautions: Sternal;Fall Precaution Comments: pt able to state precautions; requires cues to adhere  Restrictions Weight Bearing Restrictions: No   Pertinent Vitals/Pain HR 80-84 prior to fall (RN noted HR 60 once telemetry re-established after fall) BP once back in bed 123/58 SaO2 93-97% 5L [place] O2     Mobility  Bed Mobility Bed Mobility: Rolling Right;Right Sidelying to Sit;Sitting - Scoot to [place] of Bed Rolling Right: 3: Mod assist Right Sidelying to Sit: 1: +2 Total assist;HOB elevated Right Sidelying to Sit: Patient Percentage: 50% Details for Bed Mobility Assistance: HOB 15; pt hugging pillow as rolling, side to sit; assist to maintain sternal  precautions Transfers Transfers: Sit to Stand;Stand to Sit Sit to Stand: 1: +2 Total assist;Without upper extremity assist Sit to Stand: Patient Percentage: 80% Stand to Sit: 1: +2 Total assist Stand to Sit: Patient Percentage: 80% Transfer via Lift Equipment: [place] (floor to bed transfer with nursing assist) Details for Transfer Assistance: Stood from bed to [facility]; stood from [facility] and began to prepare to ambulate when pt reported he was feeling dizzy. Pt looked anxious and told him to sit back down on BSC. As he began to sit, he passed out and fell forward (see separate progress note for details) Ambulation/Gait Ambulation/Gait Assistance: Not tested (comment)    Exercises     PT Diagnosis:    PT Problem List:   PT Treatment Interventions:     PT Goals (current goals can now be found in the care plan section)    Visit Information  Last PT Received On: 10/18/13 Assistance Needed: +2 History of Present Illness: Adm 11/01 with MI; 11/05 CABG x 2 and remained on vent until 11/09    Subjective Data  Subjective: I just blacked out (re: his fall)   Cognition  Cognition Arousal/Alertness: Awake/alert Behavior During Therapy: WFL for tasks assessed/performed Overall Cognitive Status: Within Functional Limits for tasks assessed    Balance     End of Session PT - End of Session Equipment Utilized During Treatment: Oxygen Activity Tolerance: Treatment limited secondary to medical complications (Comment) Patient left: in bed;with call bell/phone within reach;with nursing/sitter in room Nurse Communication: Other (comment) (details of pt's fall; also discussed with Dr. Cornelius Moras)   GP  Monique Hefty 10/18/2013, 12:34 PM Pager [phone]

## 2013-10-18 NOTE — Progress Notes (Signed)
Advanced Heart Failure Rounding Note   Subjective:     Mr. Kovalcik is a 79 yo male with a hx of HTN, severe anxiety, and OSA who presented to the ED 10/05/13 with a STEMI and was taken to the cath lab. Cath revealed proximally occluded LAD - after flow was restored it was apparently there was extensive LAD disease as well as severe 90-95% ostial D1 disease . There was moderate to severe focal disease of the sub-branches as well. Balloon angioplasty was performed, but no stent was placed. TIMI 2 flow was noted in the LAD and an IABP was placed. He was then evaluated by TCTS and was taken for a CABG x2 LIMA-LAD; SVG-DIAG on 10/08/13. After CABG he was brought back to the ICU still on IABP and multiple pressors. The balloon pump was weaned off 10/10/13 and he was extubated 10/13/13. Patient developed transient afib on 11/11 and was started on Amiodarone gtt.   11/11/4 Venous doppler acute deep DVT involving R peroneal vein  POD # 10 Continues to diurese briskly. Over 6L negative but weight down only 5 pounds per am recording. Breathing much better. Walking with PT but had a fall earlier today. Nephrology stopped metolazone and turned lasix drip down to 15/hr. Urine output still 150-200/hr . CVP 10  Cr 2.1->1.6->2.1 -> 1.25  K 2.7 pBNP 30K Co-ox 63%-> 55%  Objective:   Weight Range:  Vital Signs:   Temp:  [97.4 F (36.3 C)-98.7 F (37.1 C)] 97.4 F (36.3 C) (11/14 1615) Pulse Rate:  [76-97] 84 (11/14 1800) Resp:  [11-19] 15 (11/14 1800) BP: (97-127)/(48-67) 113/53 mmHg (11/14 1800) SpO2:  [92 %-99 %] 96 % (11/14 1800) Weight:  [122.97 kg (271 lb 1.6 oz)] 122.97 kg (271 lb 1.6 oz) (11/14 0500) Last BM Date: 10/18/13  Weight change: Filed Weights   10/16/13 0500 10/17/13 0500 10/18/13 0500  Weight: 126 kg (277 lb 12.5 oz) 125.2 kg (276 lb 0.3 oz) 122.97 kg (271 lb 1.6 oz)    Intake/Output:   Intake/Output Summary (Last 24 hours) at 10/18/13 1839 Last data filed at 10/18/13 1800  Gross  per 24 hour  Intake   1217 ml  Output   7550 ml  Net  -6333 ml     Physical Exam: General:  Much more alert breathing better HEENT: normal Neck: supple. JVP ~10. Carotids 2+ bilat; no bruits. No lymphadenopathy or thryomegaly appreciated. Cor: PMI nondisplaced. Regular rate & rhythm. No rubs, gallops or murmurs. Lungs: clear Abdomen: soft, nontender, minimally distended. No hepatosplenomegaly. No bruits or masses. Good bowel sounds. Extremities: no cyanosis, clubbing, rash, 1-2 + bilateral edema Neuro: alert & orientedx3, cranial nerves grossly intact. moves all 4 extremities w/o difficulty. Affect pleasant  Telemetry: SR 80s  Labs: Basic Metabolic Panel:  Recent Labs Lab 10/16/13 1750 10/17/13 0400 10/17/13 1501 10/18/13 0424 10/18/13 1455  NA 141 143 144 144 137  K 3.1* 2.9* 3.2* 3.0* 2.7*  CL 102 103 103 100 93*  CO2 25 27 29  32 33*  GLUCOSE 164* 112* 113* 105* 141*  BUN 69* 71* 72* 68* 64*  CREATININE 1.56* 2.14* 2.16* 2.12* 1.25  CALCIUM 7.9* 8.2* 8.4 8.2* 8.5  PHOS  --   --  3.6  --   --     Liver Function Tests:  Recent Labs Lab 10/15/13 0100 10/15/13 0104 10/16/13 0404 10/17/13 0400 10/17/13 1501 10/18/13 0424  AST 44* 33 44* 33  --  29  ALT 37 32 47 43  --  38  ALKPHOS 62 52 70 74  --  76  BILITOT 1.5* 1.3* 1.7* 1.3*  --  1.3*  PROT 5.4* 4.6* 5.8* 6.0  --  5.9*  ALBUMIN 2.7* 2.2* 2.8* 2.7* 2.7* 2.6*   No results found for this basename: LIPASE, AMYLASE,  in the last 168 hours No results found for this basename: AMMONIA,  in the last 168 hours  CBC:  Recent Labs Lab 10/15/13 0100 10/15/13 0104 10/16/13 0404 10/17/13 0400 10/18/13 0424  WBC 10.6* 8.9 11.3* 9.7 11.2*  HGB 8.0* 6.9* 9.5* 10.2* 10.7*  HCT 24.4* 21.6* 28.7* 29.9* 32.2*  MCV 93.1 96.0 91.1 90.6 90.4  PLT 383 363 461* 445* 496*    Cardiac Enzymes: No results found for this basename: CKTOTAL, CKMB, CKMBINDEX, TROPONINI,  in the last 168 hours  BNP: BNP (last 3  results)  Recent Labs  10/07/13 0400 10/17/13 0415  PROBNP 3092.0* 30734.0*    Imaging: Dg Chest Port 1 View  10/17/2013   CLINICAL DATA:  Myocardial infarction.  EXAM: PORTABLE CHEST - 1 VIEW  COMPARISON:  10/16/2013  FINDINGS: Right-sided PICC line is unchanged. Prior median sternotomy. Cardiomegaly accentuated by AP portable technique. Improved to resolved right pleural effusion. Small left pleural effusion remains. Numerous leads and wires project over the chest. No pneumothorax. Improved mild interstitial edema. Left greater than right bibasilar airspace disease is similar to minimally improved.  IMPRESSION: Improved aeration, with decreased interstitial edema and decreased to resolved right pleural effusion.  Patchy bibasilar airspace disease and small left pleural effusion remain.   Electronically Signed   By: Jeronimo Greaves M.D.   On: 10/17/2013 07:36     Medications:     Scheduled Medications: . amiodarone  400 mg Oral BID  . aspirin EC  325 mg Oral Daily  . atorvastatin  80 mg Oral q1800  . bisacodyl  10 mg Oral Daily   Or  . bisacodyl  10 mg Rectal Daily  . budesonide (PULMICORT) nebulizer solution  0.25 mg Nebulization BID  . docusate sodium  200 mg Oral Daily  . enoxaparin (LOVENOX) injection  125 mg Subcutaneous Q12H  . feeding supplement (ENSURE)  1 Container Oral TID BM  . insulin aspart  0-24 Units Subcutaneous Q4H  . levalbuterol  1.25 mg Nebulization TID  . pantoprazole  40 mg Oral Q1200  . potassium chloride  10 mEq Intravenous Q1 Hr x 5  . potassium chloride  40 mEq Oral TID  . sodium chloride  3 mL Intravenous Q12H  . warfarin  2.5 mg Oral q1800  . Warfarin - Physician Dosing Inpatient   Does not apply q1800    Infusions: . sodium chloride 10 mL/hr at 10/18/13 0900  . DOPamine 3 mcg/kg/min (10/18/13 1200)  . furosemide (LASIX) infusion 15 mg/hr (10/18/13 1200)  . milrinone 0.2 mcg/kg/min (10/18/13 1200)    PRN Medications: ALPRAZolam, levalbuterol,  metoprolol, ondansetron (ZOFRAN) IV, oxyCODONE, RESOURCE THICKENUP CLEAR, sodium chloride, sodium chloride, traMADol   Assessment:   1) CAD   S/p CABG x 2 10/08/13 2) STEMI 3) A/C systolic HF    EF 30-35% 4) HTN 5) OSA 6) Severe Anxiety 7) Respiratory failure,   -- vent-mask 50% 8) Afib 9) AKI 10) DVT   -- R peroneal vein   -- on lovenox and coumadin 11) Hypokalemia  Plan/Discussion:    Volume status much improved. Renal function improving. Agree with turning diuretics down a little bit. Co-ox is marginal. Would not wean milrinone and dopamine  until we get volume status closer to baseline (he is still 20 pounds up from pre-op weight). Will supp K+ aggressively. Add spiro to prevent K wasting  Agree with heparin/coumadin for DVT.   No ACE or b-blocker at this point due to renal failure and decompensation.   The patient is critically ill with multiple organ systems failure and requires high complexity decision making for assessment and support, frequent evaluation and titration of therapies, application of advanced monitoring technologies and extensive interpretation of multiple databases.   Critical Care Time devoted to patient care services described in this note is 35 Minutes.  [name] 6:39 PM

## 2013-10-18 NOTE — Progress Notes (Signed)
Patient ID: Antonio Bernard, male   DOB: 10-Nov-1933, 79 y.o.   MRN: [id number]   SICU Evening Rounds:   Hemodynamically stable on dop 3, milrinone 0.2   Urine output excellent on lasix drip 15 mg per hr. -3200 cc today so far.  Creatinine back to normal.  CBC    Component Value Date/Time   WBC 11.2* 10/18/2013 0424   RBC 3.56* 10/18/2013 0424   HGB 10.7* 10/18/2013 0424   HCT 32.2* 10/18/2013 0424   PLT 496* 10/18/2013 0424   MCV 90.4 10/18/2013 0424   MCH 30.1 10/18/2013 0424   MCHC 33.2 10/18/2013 0424   RDW 14.9 10/18/2013 0424   LYMPHSABS 2.5 10/05/2013 2300   MONOABS 0.8 10/05/2013 2300   EOSABS 0.2 10/05/2013 2300   BASOSABS 0.0 10/05/2013 2300     BMET    Component Value Date/Time   NA 137 10/18/2013 1455   K 2.7* 10/18/2013 1455   CL 93* 10/18/2013 1455   CO2 33* 10/18/2013 1455   GLUCOSE 141* 10/18/2013 1455   BUN 64* 10/18/2013 1455   CREATININE 1.25 10/18/2013 1455   CALCIUM 8.5 10/18/2013 1455   GFRNONAA 54* 10/18/2013 1455   GFRAA 62* 10/18/2013 1455     A/P:  Stable postop course. Continue current plans. Repleat K+

## 2013-10-18 NOTE — Progress Notes (Signed)
Physical Therapy Note (see also PT Treatment note for remaining details of treatment session)  Patient requested to use BSC prior to walking. Stand pivot from bed to [facility] with +2 assist, pt sat for ~4 minutes without success), stood from [facility] to prepare to ambulate and reported feeling dizzy. Pt appeared anxious and ashen and instructed to sit down on BSC. As pt began to sit (~1/2 way down to sitting) he passed out, falling forward with his upper torso bending over the back of the wheelchair placed in front of him (for walking). PT and rehab tech were on either side assisting pt, however could not shift his weight back onto [facility] as wheelchair began rolling forward and tipping backwards. Pt assisted down on his knees and then continued forward into prone position as wheelchair continued to roll forwards. Multiple nurses in to assist, including pt's nurse, Ulyess Blossom, RN. Pt awake and answering questions. Nursing completed assessment and determined pt ready to lift from floor to bed via Maxi-Sky. Pt rolled side to side with 4 person assist to place lift pad and lifted back to bed. Nursing completed assessing pt who reported he only felt pain in his lower chest (6/10). Dr. Cornelius Moras called by RN and I explained events related to fall.   10/18/2013 Veda Canning, PT Pager: [phone]

## 2013-10-19 LAB — POCT I-STAT, CHEM 8
BUN: 52 mg/dL — ABNORMAL HIGH (ref 6–23)
Calcium, Ion: 1.14 mmol/L (ref 1.13–1.30)
HCT: 33 % — ABNORMAL LOW (ref 39.0–52.0)
Hemoglobin: 11.2 g/dL — ABNORMAL LOW (ref 13.0–17.0)
Potassium: 2.9 mEq/L — ABNORMAL LOW (ref 3.5–5.1)
Sodium: 136 mEq/L (ref 135–145)
TCO2: 34 mmol/L (ref 0–100)

## 2013-10-19 LAB — COMPREHENSIVE METABOLIC PANEL
ALT: 37 U/L (ref 0–53)
AST: 32 U/L (ref 0–37)
Albumin: 2.6 g/dL — ABNORMAL LOW (ref 3.5–5.2)
Alkaline Phosphatase: 77 U/L (ref 39–117)
BUN: 59 mg/dL — ABNORMAL HIGH (ref 6–23)
CO2: 36 mEq/L — ABNORMAL HIGH (ref 19–32)
Calcium: 9 mg/dL (ref 8.4–10.5)
Chloride: 93 mEq/L — ABNORMAL LOW (ref 96–112)
Creatinine, Ser: 1.99 mg/dL — ABNORMAL HIGH (ref 0.50–1.35)
GFR calc Af Amer: 36 mL/min — ABNORMAL LOW (ref >90)
GFR calc non Af Amer: 31 mL/min — ABNORMAL LOW (ref >90)
Glucose, Bld: 97 mg/dL (ref 70–99)
Potassium: 3.2 mEq/L — ABNORMAL LOW (ref 3.5–5.1)
Sodium: 139 mEq/L (ref 135–145)
Total Bilirubin: 1.2 mg/dL (ref 0.3–1.2)
Total Protein: 5.9 g/dL — ABNORMAL LOW (ref 6.0–8.3)

## 2013-10-19 LAB — CBC
HCT: 33.8 % — ABNORMAL LOW (ref 39.0–52.0)
Hemoglobin: 11.7 g/dL — ABNORMAL LOW (ref 13.0–17.0)
MCH: 31 pg (ref 26.0–34.0)
MCHC: 34.6 g/dL (ref 30.0–36.0)
MCV: 89.7 fL (ref 78.0–100.0)
Platelets: 513 10*3/uL — ABNORMAL HIGH (ref 150–400)
RBC: 3.77 MIL/uL — ABNORMAL LOW (ref 4.22–5.81)
RDW: 14.1 % (ref 11.5–15.5)
WBC: 11.8 10*3/uL — ABNORMAL HIGH (ref 4.0–10.5)

## 2013-10-19 LAB — GLUCOSE, CAPILLARY
Glucose-Capillary: 105 mg/dL — ABNORMAL HIGH (ref 70–99)
Glucose-Capillary: 123 mg/dL — ABNORMAL HIGH (ref 70–99)
Glucose-Capillary: 126 mg/dL — ABNORMAL HIGH (ref 70–99)
Glucose-Capillary: 93 mg/dL (ref 70–99)
Glucose-Capillary: 96 mg/dL (ref 70–99)

## 2013-10-19 LAB — CARBOXYHEMOGLOBIN
Carboxyhemoglobin: 1.6 % — ABNORMAL HIGH (ref 0.5–1.5)
Methemoglobin: 1.4 % (ref 0.0–1.5)
O2 Saturation: 62 %
Total hemoglobin: 11.5 g/dL — ABNORMAL LOW (ref 13.5–18.0)

## 2013-10-19 LAB — PROTIME-INR
INR: 1.62 — ABNORMAL HIGH (ref 0.00–1.49)
Prothrombin Time: 18.8 seconds — ABNORMAL HIGH (ref 11.6–15.2)

## 2013-10-19 MED ORDER — POTASSIUM CHLORIDE CRYS ER 20 MEQ PO TBCR
40.0000 meq | EXTENDED_RELEASE_TABLET | Freq: Once | ORAL | Status: AC
  Administered 2013-10-19: 40 meq via ORAL

## 2013-10-19 MED ORDER — POTASSIUM CHLORIDE 10 MEQ/50ML IV SOLN
10.0000 meq | INTRAVENOUS | Status: AC | PRN
  Administered 2013-10-19 (×3): 10 meq via INTRAVENOUS

## 2013-10-19 MED ORDER — POTASSIUM CHLORIDE 10 MEQ/50ML IV SOLN
10.0000 meq | INTRAVENOUS | Status: AC
  Administered 2013-10-19 (×3): 10 meq via INTRAVENOUS

## 2013-10-19 MED ORDER — ASPIRIN EC 81 MG PO TBEC
81.0000 mg | DELAYED_RELEASE_TABLET | Freq: Every day | ORAL | Status: DC
  Administered 2013-10-20 – 2013-10-25 (×6): 81 mg via ORAL
  Filled 2013-10-19 (×7): qty 1

## 2013-10-19 NOTE — Progress Notes (Signed)
Advanced Heart Failure Rounding Note   Subjective:     Antonio Bernard is a 79 yo male with a hx of HTN, severe anxiety, and OSA who presented to the ED 10/05/13 with a STEMI and was taken to the cath lab. Cath revealed proximally occluded LAD - after flow was restored it was apparently there was extensive LAD disease as well as severe 90-95% ostial D1 disease . There was moderate to severe focal disease of the sub-branches as well. Balloon angioplasty was performed, but no stent was placed. TIMI 2 flow was noted in the LAD and an IABP was placed. He was then evaluated by TCTS and was taken for a CABG x2 LIMA-LAD; SVG-DIAG on 10/08/13. After CABG he was brought back to the ICU still on IABP and multiple pressors. The balloon pump was weaned off 10/10/13 and he was extubated 10/13/13. Patient developed transient afib on 11/11 and was started on Amiodarone gtt.   11/11/4 Venous doppler acute deep DVT involving R peroneal vein  POD # 11 Continues to diurese briskly. Weight down 13 pounds in 2 days. Breathing much better. Walking with PT Nephrology turned lasix drip down to 5/hr. CVP measured personally = 11  Cr 2.1->1.6->2.1 -> 1.25 (erroneous) -> 1.99   K 3.2 pBNP 30K Co-ox 63%-> 55%-> 62%  Objective:   Weight Range:  Vital Signs:   Temp:  [97.4 F (36.3 C)-98.3 F (36.8 C)] 98.3 F (36.8 C) (11/15 0723) Pulse Rate:  [78-86] 80 (11/15 0900) Resp:  [10-17] 12 (11/15 0900) BP: (106-128)/(53-65) 123/63 mmHg (11/15 0900) SpO2:  [93 %-99 %] 95 % (11/15 0900) Weight:  [119.7 kg (263 lb 14.3 oz)] 119.7 kg (263 lb 14.3 oz) (11/15 0500) Last BM Date: 10/18/13  Weight change: Filed Weights   10/17/13 0500 10/18/13 0500 10/19/13 0500  Weight: 125.2 kg (276 lb 0.3 oz) 122.97 kg (271 lb 1.6 oz) 119.7 kg (263 lb 14.3 oz)    Intake/Output:   Intake/Output Summary (Last 24 hours) at 10/19/13 1027 Last data filed at 10/19/13 0900  Gross per 24 hour  Intake 1938.5 ml  Output   7500 ml  Net -5561.5  ml     Physical Exam: General:  Much more alert breathing better HEENT: normal Neck: supple. JVP ~10. Carotids 2+ bilat; no bruits. No lymphadenopathy or thryomegaly appreciated. Cor: PMI nondisplaced. Regular rate & rhythm. No rubs, gallops or murmurs. Lungs: clear Abdomen: soft, nontender, minimally distended. No hepatosplenomegaly. No bruits or masses. Good bowel sounds. Extremities: no cyanosis, clubbing, rash, 2 + bilateral edema into thighs and in arms Neuro: alert & orientedx3, cranial nerves grossly intact. moves all 4 extremities w/o difficulty. Affect pleasant  Telemetry: SR 80s  Labs: Basic Metabolic Panel:  Recent Labs Lab 10/17/13 0400 10/17/13 1501 10/18/13 0424 10/18/13 1455 10/19/13 0411  NA 143 144 144 137 139  K 2.9* 3.2* 3.0* 2.7* 3.2*  CL 103 103 100 93* 93*  CO2 27 29 32 33* 36*  GLUCOSE 112* 113* 105* 141* 97  BUN 71* 72* 68* 64* 59*  CREATININE 2.14* 2.16* 2.12* 1.25 1.99*  CALCIUM 8.2* 8.4 8.2* 8.5 9.0  PHOS  --  3.6  --   --   --     Liver Function Tests:  Recent Labs Lab 10/15/13 0104 10/16/13 0404 10/17/13 0400 10/17/13 1501 10/18/13 0424 10/19/13 0411  AST 33 44* 33  --  29 32  ALT 32 47 43  --  38 37  ALKPHOS 52 70 74  --  76 77  BILITOT 1.3* 1.7* 1.3*  --  1.3* 1.2  PROT 4.6* 5.8* 6.0  --  5.9* 5.9*  ALBUMIN 2.2* 2.8* 2.7* 2.7* 2.6* 2.6*   No results found for this basename: LIPASE, AMYLASE,  in the last 168 hours No results found for this basename: AMMONIA,  in the last 168 hours  CBC:  Recent Labs Lab 10/15/13 0104 10/16/13 0404 10/17/13 0400 10/18/13 0424 10/19/13 0411  WBC 8.9 11.3* 9.7 11.2* 11.8*  HGB 6.9* 9.5* 10.2* 10.7* 11.7*  HCT 21.6* 28.7* 29.9* 32.2* 33.8*  MCV 96.0 91.1 90.6 90.4 89.7  PLT 363 461* 445* 496* 513*    Cardiac Enzymes: No results found for this basename: CKTOTAL, CKMB, CKMBINDEX, TROPONINI,  in the last 168 hours  BNP: BNP (last 3 results)  Recent Labs  10/07/13 0400  10/17/13 0415  PROBNP 3092.0* 30734.0*    Imaging: No results found.   Medications:     Scheduled Medications: . amiodarone  400 mg Oral BID  . aspirin EC  325 mg Oral Daily  . atorvastatin  80 mg Oral q1800  . bisacodyl  10 mg Oral Daily   Or  . bisacodyl  10 mg Rectal Daily  . budesonide (PULMICORT) nebulizer solution  0.25 mg Nebulization BID  . docusate sodium  200 mg Oral Daily  . enoxaparin (LOVENOX) injection  125 mg Subcutaneous Q12H  . feeding supplement (ENSURE)  1 Container Oral TID BM  . insulin aspart  0-24 Units Subcutaneous Q4H  . levalbuterol  1.25 mg Nebulization TID  . pantoprazole  40 mg Oral Q1200  . potassium chloride  40 mEq Oral TID  . sodium chloride  3 mL Intravenous Q12H  . spironolactone  25 mg Oral Daily  . warfarin  2.5 mg Oral q1800  . Warfarin - Physician Dosing Inpatient   Does not apply q1800    Infusions: . sodium chloride 20 mL/hr at 10/19/13 0900  . DOPamine 3 mcg/kg/min (10/19/13 0900)  . furosemide (LASIX) infusion 5 mg/hr (10/19/13 0900)  . milrinone 0.2 mcg/kg/min (10/19/13 0900)    PRN Medications: ALPRAZolam, levalbuterol, metoprolol, ondansetron (ZOFRAN) IV, oxyCODONE, RESOURCE THICKENUP CLEAR, sodium chloride, sodium chloride, traMADol   Assessment:   1) CAD   S/p CABG x 2 10/08/13 2) STEMI 3) A/C systolic HF    EF 30-35% 4) HTN 5) OSA 6) Severe Anxiety 7) Respiratory failure,   -- vent-mask 50% 8) Afib 9) AKI 10) DVT   -- R peroneal vein   -- on lovenox and coumadin 11) Hypokalemia  Plan/Discussion:    Volume status continues to improve. Renal function improving. Still with about 15 pounds of fluid on board. If urine output falls < 150/hr would increase lasix gtt back up to 10.   Co-ox is ok but would not wean milrinone and dopamine until we get volume status closer to baseline so as not to confuse picture if urine output slows down.  Agree with heparin/coumadin for DVT.   No ACE or b-blocker at this  point due to renal failure and decompensation.    Ahnesti Townsend,MD 10:27 AM

## 2013-10-19 NOTE — Progress Notes (Signed)
Patient ID: Antonio Bernard, male   DOB: 1933-04-24, 79 y.o.   MRN: [id number]  SICU Evening Rounds:  Hemodynamically stable. On Milrinone and dop.   Diuresing 100 to 200 per hr. Lasix drip decreased to 5 mg this am but put back up to 10 mg by Dr. Gala Romney for decreased urine to 125/hr. Check K+ tonight.

## 2013-10-19 NOTE — Progress Notes (Signed)
11 Days Post-Op Procedure(s) (LRB): CORONARY ARTERY BYPASS GRAFTING (CABG) (N/A) INTRAOPERATIVE TRANSESOPHAGEAL ECHOCARDIOGRAM (N/A) Subjective:  Has had some dizziness. Near syncopal episode yesterday when getting up with PT.  Objective: Vital signs in last 24 hours: Temp:  [97.4 F (36.3 C)-98.3 F (36.8 C)] 98.3 F (36.8 C) (11/15 0723) Pulse Rate:  [78-86] 85 (11/15 1000) Cardiac Rhythm:  [-] Normal sinus rhythm (11/15 1000) Resp:  [10-17] 17 (11/15 1000) BP: (106-128)/(53-65) 112/60 mmHg (11/15 1000) SpO2:  [93 %-99 %] 93 % (11/15 1000) Weight:  [119.7 kg (263 lb 14.3 oz)] 119.7 kg (263 lb 14.3 oz) (11/15 0500)    Hemodynamic parameters for last 24 hours: CVP:  [7 mmHg-12 mmHg] 9 mmHg  Co-ox 62% this am  Intake/Output from previous day: 11/14 0701 - 11/15 0700 In: 1735.5 [P.O.:300; I.V.:935.5; IV Piggyback:500] Out: 8050 [Urine:8050] Intake/Output this shift: Total I/O In: 319 [P.O.:240; I.V.:79] Out: 700 [Urine:700]  General appearance: alert and cooperative Neurologic: intact Heart: regular rate and rhythm, S1, S2 normal, no murmur, click, rub or gallop Lungs: diminished breath sounds bibasilar Abdomen: distended but soft with good bowel sounds Extremities: edema mild Wound: incision ok  Lab Results:  Recent Labs  10/18/13 0424 10/19/13 0411  WBC 11.2* 11.8*  HGB 10.7* 11.7*  HCT 32.2* 33.8*  PLT 496* 513*   BMET:  Recent Labs  10/18/13 1455 10/19/13 0411  NA 137 139  K 2.7* 3.2*  CL 93* 93*  CO2 33* 36*  GLUCOSE 141* 97  BUN 64* 59*  CREATININE 1.25 1.99*  CALCIUM 8.5 9.0    PT/INR:  Recent Labs  10/19/13 0411  LABPROT 18.8*  INR 1.62*   ABG    Component Value Date/Time   PHART 7.425 10/14/2013 0441   HCO3 25.1* 10/14/2013 0441   TCO2 26.3 10/14/2013 0441   ACIDBASEDEF 2.0 10/08/2013 1524   O2SAT 62.0 10/19/2013 0414   CBG (last 3)   Recent Labs  10/18/13 2339 10/19/13 0350 10/19/13 0720  GLUCAP 105* 93 96     Assessment/Plan: S/P Procedure(s) (LRB): CORONARY ARTERY BYPASS GRAFTING (CABG) (N/A) INTRAOPERATIVE TRANSESOPHAGEAL ECHOCARDIOGRAM (N/A) Hemodynamically stable on low dose Milrinone and dopamine. Will continue this for now until diuresed fully.  Volume excess. He is - 12 L for the past 48 hrs and weight dropping nicely. Still about 13 lbs over baseline. Lasix drip decreased to slow diuresis with dizziness and increased creatinine. He may be getting dried out too quickly.  INR increasing but still subtherapeutic. Continue Lovenox.  Mobilize with PT.  Encourage nutrition    LOS: 14 days    Skilar Marcou K 10/19/2013

## 2013-10-19 NOTE — Progress Notes (Signed)
Subjective:  Diuresed 13 lbs in 2 days ! Had syncope with PT yesterday.  creatinine decreasing slowly (I think 1.25 was likely error).  Looks much better still Objective Vital signs in last 24 hours: Filed Vitals:   10/19/13 0600 10/19/13 0700 10/19/13 0723 10/19/13 0731  BP: 106/61 118/62    Pulse: 78 83    Temp:   98.3 F (36.8 C)   TempSrc:   Oral   Resp: 11 16    Height:      Weight:      SpO2: 95% 94%  98%   Weight change: -3.27 kg (-7 lb 3.4 oz)  Intake/Output Summary (Last 24 hours) at 10/19/13 0910 Last data filed at 10/19/13 0700  Gross per 24 hour  Intake   1659 ml  Output   7150 ml  Net  -5491 ml    Assessment/ Plan: Pt is a 79 y.o. yo male who was admitted on 10/05/2013 with  [place] req PTCA and then CABG with IABP with decompensated heart failure and  A on CRF  1. AKI in setting of acute decompensated systolic CHF- Diuresed a ton (maybe too much) on current regimen.  Will change meds again to decrease lasix drip to 5 mg per hour since aldactone was added back as well.   Aim for 1-2 liters of diuresis over next day. Could also consider weaning off of dopamine as BP allows.  Kidney function fortunately stable to improved.    2. STEMI s/p CABG x 2. On milrinone/dopa/amio/lipitor/metoprolol. Plan per CT surgery and Cardiology 3. Acute systolic CHF (EF 16-10%) as above. Hopefully he will continue to respond to increased diuretics.  4. COPD/OSA- on xopenex  5. Hypokalemia-  Have  increased repletion to 40 TID with supplemental IV as needed- added aldactone as well. 6. Hyponatremia- improved with diuresis. 7. Hyperglycemia- insulin per primary svc 8. ABLA- follow H/H and transfuse as needed. Currently stable to improved with diuresis    Claretha Townshend A    Labs: Basic Metabolic Panel:  Recent Labs Lab 10/17/13 0400 10/17/13 1501 10/18/13 0424 10/18/13 1455 10/19/13 0411  NA 143 144 144 137 139  K 2.9* 3.2* 3.0* 2.7* 3.2*  CL 103 103 100 93* 93*  CO2 27 29  32 33* 36*  GLUCOSE 112* 113* 105* 141* 97  BUN 71* 72* 68* 64* 59*  CREATININE 2.14* 2.16* 2.12* 1.25 1.99*  CALCIUM 8.2* 8.4 8.2* 8.5 9.0  PHOS  --  3.6  --   --   --    Liver Function Tests:  Recent Labs Lab 10/17/13 0400 10/17/13 1501 10/18/13 0424 10/19/13 0411  AST 33  --  29 32  ALT 43  --  38 37  ALKPHOS 74  --  76 77  BILITOT 1.3*  --  1.3* 1.2  PROT 6.0  --  5.9* 5.9*  ALBUMIN 2.7* 2.7* 2.6* 2.6*   No results found for this basename: LIPASE, AMYLASE,  in the last 168 hours No results found for this basename: AMMONIA,  in the last 168 hours CBC:  Recent Labs Lab 10/15/13 0104 10/16/13 0404 10/17/13 0400 10/18/13 0424 10/19/13 0411  WBC 8.9 11.3* 9.7 11.2* 11.8*  HGB 6.9* 9.5* 10.2* 10.7* 11.7*  HCT 21.6* 28.7* 29.9* 32.2* 33.8*  MCV 96.0 91.1 90.6 90.4 89.7  PLT 363 461* 445* 496* 513*   Cardiac Enzymes: No results found for this basename: CKTOTAL, CKMB, CKMBINDEX, TROPONINI,  in the last 168 hours CBG:  Recent Labs Lab 10/18/13  1613 10/18/13 1926 10/18/13 2339 10/19/13 0350 10/19/13 0720  GLUCAP 106* 105* 105* 93 96    Iron Studies: No results found for this basename: IRON, TIBC, TRANSFERRIN, FERRITIN,  in the last 72 hours Studies/Results: No results found. Medications: Infusions: . sodium chloride 10 mL/hr at 10/19/13 0400  . DOPamine 3 mcg/kg/min (10/19/13 0400)  . furosemide (LASIX) infusion 15 mg/hr (10/19/13 0400)  . milrinone 0.2 mcg/kg/min (10/19/13 0400)    Scheduled Medications: . amiodarone  400 mg Oral BID  . aspirin EC  325 mg Oral Daily  . atorvastatin  80 mg Oral q1800  . bisacodyl  10 mg Oral Daily   Or  . bisacodyl  10 mg Rectal Daily  . budesonide (PULMICORT) nebulizer solution  0.25 mg Nebulization BID  . docusate sodium  200 mg Oral Daily  . enoxaparin (LOVENOX) injection  125 mg Subcutaneous Q12H  . feeding supplement (ENSURE)  1 Container Oral TID BM  . insulin aspart  0-24 Units Subcutaneous Q4H  .  levalbuterol  1.25 mg Nebulization TID  . pantoprazole  40 mg Oral Q1200  . potassium chloride  40 mEq Oral TID  . sodium chloride  3 mL Intravenous Q12H  . spironolactone  25 mg Oral Daily  . warfarin  2.5 mg Oral q1800  . Warfarin - Physician Dosing Inpatient   Does not apply q1800    have reviewed scheduled and prn medications.  Physical Exam: General:on [place] O2 alert, NAD Heart: RRR Lungs: CBS bilat Abdomen: obese, non tender Extremities: edema is better    10/19/2013,9:10 AM  LOS: 14 days

## 2013-10-19 NOTE — Progress Notes (Signed)
ANTICOAGULATION CONSULT NOTE - Follow Up Consult  Pharmacy Consult for Lovenox Indication: DVT  Allergies  Allergen Reactions  . Demerol [Meperidine] Other (See Comments)    "makes me crazy"  . Lisinopril Diarrhea  . Shellfish Allergy Rash    Labs:  Recent Labs  10/17/13 0400  10/18/13 0424 10/18/13 1455 10/19/13 0411  HGB 10.2*  --  10.7*  --  11.7*  HCT 29.9*  --  32.2*  --  33.8*  PLT 445*  --  496*  --  513*  LABPROT  --   --  16.9*  --  18.8*  INR  --   --  1.41  --  1.62*  HEPARINUNFRC 0.48  --  0.85*  --   --   CREATININE 2.14*  < > 2.12* 1.25 1.99*  < > = values in this interval not displayed.  Estimated Creatinine Clearance: 42.1 ml/min (by C-G formula based on Cr of 1.99).   Assessment: Antonio Bernard on Lovenox for acute DVT.  Coumadin per MD, INR = 1.62, CBC stable  Goal of Therapy:  Anti-Xa level 0.6-1.2 units/ml 4hrs after LMWH dose given Monitor platelets by anticoagulation protocol: Yes   Plan:  1. Continue Lovenox 125mg  (~1 mg/kg) SQ q12h  2. Monitor CBC, renal function and s/sx of bleeding  Thank you. Okey Regal, PharmD [phone]  10/19/2013,1:14 PM

## 2013-10-20 LAB — COMPREHENSIVE METABOLIC PANEL
ALT: 36 U/L (ref 0–53)
AST: 34 U/L (ref 0–37)
Albumin: 2.6 g/dL — ABNORMAL LOW (ref 3.5–5.2)
Alkaline Phosphatase: 81 U/L (ref 39–117)
BUN: 54 mg/dL — ABNORMAL HIGH (ref 6–23)
CO2: 37 mEq/L — ABNORMAL HIGH (ref 19–32)
Calcium: 9.1 mg/dL (ref 8.4–10.5)
Chloride: 90 mEq/L — ABNORMAL LOW (ref 96–112)
Creatinine, Ser: 1.94 mg/dL — ABNORMAL HIGH (ref 0.50–1.35)
GFR calc Af Amer: 37 mL/min — ABNORMAL LOW (ref >90)
GFR calc non Af Amer: 32 mL/min — ABNORMAL LOW (ref >90)
Glucose, Bld: 120 mg/dL — ABNORMAL HIGH (ref 70–99)
Potassium: 3.8 mEq/L (ref 3.5–5.1)
Sodium: 137 mEq/L (ref 135–145)
Total Bilirubin: 0.9 mg/dL (ref 0.3–1.2)
Total Protein: 5.8 g/dL — ABNORMAL LOW (ref 6.0–8.3)

## 2013-10-20 LAB — CBC
HCT: 33.2 % — ABNORMAL LOW (ref 39.0–52.0)
Hemoglobin: 11.3 g/dL — ABNORMAL LOW (ref 13.0–17.0)
MCH: 30.5 pg (ref 26.0–34.0)
MCHC: 34 g/dL (ref 30.0–36.0)
MCV: 89.7 fL (ref 78.0–100.0)
Platelets: 472 10*3/uL — ABNORMAL HIGH (ref 150–400)
RBC: 3.7 MIL/uL — ABNORMAL LOW (ref 4.22–5.81)
RDW: 13.7 % (ref 11.5–15.5)
WBC: 11.1 10*3/uL — ABNORMAL HIGH (ref 4.0–10.5)

## 2013-10-20 LAB — PROTIME-INR
INR: 1.48 (ref 0.00–1.49)
Prothrombin Time: 17.5 seconds — ABNORMAL HIGH (ref 11.6–15.2)

## 2013-10-20 LAB — GLUCOSE, CAPILLARY
Glucose-Capillary: 113 mg/dL — ABNORMAL HIGH (ref 70–99)
Glucose-Capillary: 129 mg/dL — ABNORMAL HIGH (ref 70–99)
Glucose-Capillary: 89 mg/dL (ref 70–99)
Glucose-Capillary: 126 mg/dL — ABNORMAL HIGH (ref 70–99)

## 2013-10-20 LAB — CARBOXYHEMOGLOBIN
Carboxyhemoglobin: 1.6 % — ABNORMAL HIGH (ref 0.5–1.5)
Methemoglobin: 0.8 % (ref 0.0–1.5)
O2 Saturation: 67.7 %
Total hemoglobin: 10.3 g/dL — ABNORMAL LOW (ref 13.5–18.0)

## 2013-10-20 MED ORDER — SODIUM CHLORIDE 0.9 % IJ SOLN
10.0000 mL | Freq: Two times a day (BID) | INTRAMUSCULAR | Status: DC
  Administered 2013-10-20 – 2013-10-21 (×3): 10 mL
  Administered 2013-10-21: 20 mL
  Administered 2013-10-22 (×2): 10 mL
  Administered 2013-10-23 – 2013-10-24 (×2): 30 mL
  Administered 2013-10-25: 10 mL

## 2013-10-20 MED ORDER — AMIODARONE HCL 200 MG PO TABS
200.0000 mg | ORAL_TABLET | Freq: Two times a day (BID) | ORAL | Status: DC
  Administered 2013-10-20: 200 mg via ORAL
  Filled 2013-10-20 (×3): qty 1

## 2013-10-20 MED ORDER — POTASSIUM CHLORIDE CRYS ER 20 MEQ PO TBCR
40.0000 meq | EXTENDED_RELEASE_TABLET | Freq: Two times a day (BID) | ORAL | Status: DC
  Administered 2013-10-20 (×2): 40 meq via ORAL
  Filled 2013-10-20 (×2): qty 2

## 2013-10-20 MED ORDER — FUROSEMIDE 40 MG PO TABS
40.0000 mg | ORAL_TABLET | Freq: Every day | ORAL | Status: DC
  Administered 2013-10-20: 40 mg via ORAL
  Filled 2013-10-20 (×2): qty 1

## 2013-10-20 NOTE — Progress Notes (Signed)
Subjective:  Diuresed 20 lbs in 3 days ! CO2 up to 37.   creatinine decreasing slowly (I think 1.25 was likely error).  Looks much better still. No dizziness this AM  Objective Vital signs in last 24 hours: Filed Vitals:   10/20/13 0600 10/20/13 0700 10/20/13 0743 10/20/13 0744  BP: 120/57 101/62    Pulse: 85 84    Temp:   97.5 F (36.4 C)   TempSrc:   Oral   Resp: 14 11    Height:      Weight:  116.3 kg (256 lb 6.3 oz)    SpO2: 95% 96%  96%   Weight change: -3.4 kg (-7 lb 7.9 oz)  Intake/Output Summary (Last 24 hours) at 10/20/13 0837 Last data filed at 10/20/13 0700  Gross per 24 hour  Intake   1608 ml  Output   4825 ml  Net  -3217 ml    Assessment/ Plan: Pt is a 79 y.o. yo male who was admitted on 10/05/2013 with  [place] req PTCA and then CABG with IABP with decompensated heart failure and  A on CRF  1. AKI in setting of acute decompensated systolic CHF- Diuresed a ton (maybe too much) on current regimen.  Very close to his regular weight (pt says 245-250 lbs) Will stop lasix drip but continue aldactone.   Aim for 1-2 liters of diuresis over next day. Could also consider weaning off of dopamine as BP allows.  Kidney function fortunately stable to improved.    2. STEMI s/p CABG x 2. On milrinone/dopa/amio/lipitor/metoprolol. Plan per CT surgery and Cardiology 3. Acute systolic CHF (EF 40-98%) as above. Hopefully he will continue to respond to increased diuretics.  4. COPD/OSA- on xopenex  5. Hypokalemia-   repletion to 40 TID with  added aldactone as well which has helped a lot. Actually will back scheduled dosing to 80 daily and follow 6. Hyponatremia- improved with diuresis. 7. Hyperglycemia- insulin per primary svc 8. ABLA- follow H/H and transfuse as needed. Currently stable to improved with diuresis    Dandrea Medders A    Labs: Basic Metabolic Panel:  Recent Labs Lab 10/17/13 0400 10/17/13 1501  10/18/13 1455 10/19/13 0411 10/19/13 2028 10/20/13 0439  NA  143 144  < > 137 139 136 137  K 2.9* 3.2*  < > 2.7* 3.2* 2.9* 3.8  CL 103 103  < > 93* 93* 89* 90*  CO2 27 29  < > 33* 36*  --  37*  GLUCOSE 112* 113*  < > 141* 97 135* 120*  BUN 71* 72*  < > 64* 59* 52* 54*  CREATININE 2.14* 2.16*  < > 1.25 1.99* 2.30* 1.94*  CALCIUM 8.2* 8.4  < > 8.5 9.0  --  9.1  PHOS  --  3.6  --   --   --   --   --   < > = values in this interval not displayed. Liver Function Tests:  Recent Labs Lab 10/18/13 0424 10/19/13 0411 10/20/13 0439  AST 29 32 34  ALT 38 37 36  ALKPHOS 76 77 81  BILITOT 1.3* 1.2 0.9  PROT 5.9* 5.9* 5.8*  ALBUMIN 2.6* 2.6* 2.6*   No results found for this basename: LIPASE, AMYLASE,  in the last 168 hours No results found for this basename: AMMONIA,  in the last 168 hours CBC:  Recent Labs Lab 10/16/13 0404 10/17/13 0400 10/18/13 0424 10/19/13 0411 10/19/13 2028 10/20/13 0439  WBC 11.3* 9.7 11.2* 11.8*  --  11.1*  HGB 9.5* 10.2* 10.7* 11.7* 11.2* 11.3*  HCT 28.7* 29.9* 32.2* 33.8* 33.0* 33.2*  MCV 91.1 90.6 90.4 89.7  --  89.7  PLT 461* 445* 496* 513*  --  472*   Cardiac Enzymes: No results found for this basename: CKTOTAL, CKMB, CKMBINDEX, TROPONINI,  in the last 168 hours CBG:  Recent Labs Lab 10/19/13 1530 10/19/13 1941 10/20/13 0006 10/20/13 0414 10/20/13 0740  GLUCAP 123* 126* 108* 117* 113*    Iron Studies: No results found for this basename: IRON, TIBC, TRANSFERRIN, FERRITIN,  in the last 72 hours Studies/Results: No results found. Medications: Infusions: . sodium chloride 20 mL/hr at 10/19/13 0900  . DOPamine 3 mcg/kg/min (10/19/13 2307)  . furosemide (LASIX) infusion 5 mg/hr (10/19/13 2316)  . milrinone 0.2 mcg/kg/min (10/19/13 2307)    Scheduled Medications: . amiodarone  400 mg Oral BID  . aspirin EC  81 mg Oral Daily  . atorvastatin  80 mg Oral q1800  . bisacodyl  10 mg Oral Daily   Or  . bisacodyl  10 mg Rectal Daily  . budesonide (PULMICORT) nebulizer solution  0.25 mg Nebulization BID   . docusate sodium  200 mg Oral Daily  . enoxaparin (LOVENOX) injection  125 mg Subcutaneous Q12H  . feeding supplement (ENSURE)  1 Container Oral TID BM  . insulin aspart  0-24 Units Subcutaneous Q4H  . levalbuterol  1.25 mg Nebulization TID  . pantoprazole  40 mg Oral Q1200  . potassium chloride  40 mEq Oral TID  . sodium chloride  3 mL Intravenous Q12H  . spironolactone  25 mg Oral Daily  . warfarin  2.5 mg Oral q1800  . Warfarin - Physician Dosing Inpatient   Does not apply q1800    have reviewed scheduled and prn medications.  Physical Exam: General:on [place] O2 alert, NAD Heart: RRR Lungs: CBS bilat Abdomen: obese, non tender Extremities: edema is better    10/20/2013,8:37 AM  LOS: 15 days

## 2013-10-20 NOTE — Progress Notes (Signed)
12 Days Post-Op Procedure(s) (LRB): CORONARY ARTERY BYPASS GRAFTING (CABG) (N/A) INTRAOPERATIVE TRANSESOPHAGEAL ECHOCARDIOGRAM (N/A) Subjective:  No complaints  Objective: Vital signs in last 24 hours: Temp:  [97.5 F (36.4 C)-98.2 F (36.8 C)] 97.5 F (36.4 C) (11/16 0743) Pulse Rate:  [70-92] 75 (11/16 1130) Cardiac Rhythm:  [-] Normal sinus rhythm (11/16 1130) Resp:  [9-20] 9 (11/16 1130) BP: (85-123)/(48-68) 92/51 mmHg (11/16 1130) SpO2:  [93 %-99 %] 96 % (11/16 1130) Weight:  [116.3 kg (256 lb 6.3 oz)] 116.3 kg (256 lb 6.3 oz) (11/16 0700)  Dopamine is off Milrinone down to 0.1  Hemodynamic parameters for last 24 hours: CVP:  [3 mmHg-18 mmHg] 10 mmHg  Intake/Output from previous day: 11/15 0701 - 11/16 0700 In: 1647.5 [P.O.:240; I.V.:1257.5; IV Piggyback:150] Out: 5075 [Urine:5075] Intake/Output this shift: Total I/O In: 497.2 [P.O.:360; I.V.:137.2] Out: 360 [Urine:360]  General appearance: cooperative and slowed mentation Neurologic: intact Heart: regular rate and rhythm, S1, S2 normal, no murmur, click, rub or gallop Lungs: clear to auscultation bilaterally Extremities: edema mild in legs Wound: incision ok  Lab Results:  Recent Labs  10/19/13 0411 10/19/13 2028 10/20/13 0439  WBC 11.8*  --  11.1*  HGB 11.7* 11.2* 11.3*  HCT 33.8* 33.0* 33.2*  PLT 513*  --  472*   BMET:  Recent Labs  10/19/13 0411 10/19/13 2028 10/20/13 0439  NA 139 136 137  K 3.2* 2.9* 3.8  CL 93* 89* 90*  CO2 36*  --  37*  GLUCOSE 97 135* 120*  BUN 59* 52* 54*  CREATININE 1.99* 2.30* 1.94*  CALCIUM 9.0  --  9.1    PT/INR:  Recent Labs  10/20/13 0439  LABPROT 17.5*  INR 1.48   ABG    Component Value Date/Time   PHART 7.425 10/14/2013 0441   HCO3 25.1* 10/14/2013 0441   TCO2 34 10/19/2013 2028   ACIDBASEDEF 2.0 10/08/2013 1524   O2SAT 67.7 10/20/2013 0451   CBG (last 3)   Recent Labs  10/20/13 0006 10/20/13 0414 10/20/13 0740  GLUCAP 108* 117* 113*     Assessment/Plan: S/P Procedure(s) (LRB): CORONARY ARTERY BYPASS GRAFTING (CABG) (N/A) INTRAOPERATIVE TRANSESOPHAGEAL ECHOCARDIOGRAM (N/A)  Hemodynamics are fairly stable but SBP 92-95. No BB or ACE I due to marginal BP and elevated creat.  Rhythm is sinus 70-80's but occasionally will drop down and pace. On amio.  Excellent diuresis over the past few days. Creat increased to 2.3 last pm, down to 1.94 this am. Lasix drip stopped. Just on daily po lasix and Aldactone. He still has volume excess but need to go slower.  Anxiety still seems to be an issue for him but he is on tid xanax at twice the dose he was on at home and is fairly sedate this am. I think general weakness and sleep deprivation is an issue.  I would not transfer to floor yet. He still has too many issues to watch and hemodynamics are borderline.   LOS: 15 days    Nollie Terlizzi K 10/20/2013

## 2013-10-20 NOTE — Progress Notes (Signed)
Advanced Heart Failure Rounding Note   Subjective:     Mr. Mckinney is a 79 yo male with a hx of HTN, severe anxiety, and OSA who presented to the ED 10/05/13 with a STEMI and was taken to the cath lab. Cath revealed proximally occluded LAD - after flow was restored it was apparently there was extensive LAD disease as well as severe 90-95% ostial D1 disease . There was moderate to severe focal disease of the sub-branches as well. Balloon angioplasty was performed, but no stent was placed. TIMI 2 flow was noted in the LAD and an IABP was placed. He was then evaluated by TCTS and was taken for a CABG x2 LIMA-LAD; SVG-DIAG on 10/08/13. After CABG he was brought back to the ICU still on IABP and multiple pressors. The balloon pump was weaned off 10/10/13 and he was extubated 10/13/13. Patient developed transient afib on 11/11 and was started on Amiodarone gtt.   11/11/4 Venous doppler acute deep DVT involving R peroneal vein 10/15/13 EF 30-35%. RV ok.   POD # 12 Continues to diurese briskly. Weight down 20 pounds in 3 days. Breathing much better. Walking with PT Nephrology stopped lasix and continued spiro. (pre-op weight 250. Weight today 256)  CVP measured personally = 7  Cr 2.1->1.6->2.1 -> 1.25 (erroneous) -> 1.99 -> 1.94  Co-ox 63%-> 55%-> 62%-> 68%  Objective:   Weight Range:  Vital Signs:   Temp:  [97.5 F (36.4 C)-98.2 F (36.8 C)] 97.5 F (36.4 C) (11/16 0743) Pulse Rate:  [77-89] 89 (11/16 0800) Resp:  [11-20] 12 (11/16 0800) BP: (99-123)/(52-68) 101/63 mmHg (11/16 0800) SpO2:  [93 %-98 %] 97 % (11/16 0800) Weight:  [116.3 kg (256 lb 6.3 oz)] 116.3 kg (256 lb 6.3 oz) (11/16 0700) Last BM Date: 10/18/13  Weight change: Filed Weights   10/18/13 0500 10/19/13 0500 10/20/13 0700  Weight: 122.97 kg (271 lb 1.6 oz) 119.7 kg (263 lb 14.3 oz) 116.3 kg (256 lb 6.3 oz)    Intake/Output:   Intake/Output Summary (Last 24 hours) at 10/20/13 0949 Last data filed at 10/20/13 0800  Gross  per 24 hour  Intake   1368 ml  Output   4775 ml  Net  -3407 ml     Physical Exam: General:  Sitting in bed. Feeling much better HEENT: normal Neck: supple. JVP ~7. Carotids 2+ bilat; no bruits. No lymphadenopathy or thryomegaly appreciated. Cor: PMI nondisplaced. Regular rate & rhythm. No rubs, gallops or murmurs. Lungs: clear Abdomen: soft, nontender, non-distended. No hepatosplenomegaly. No bruits or masses. Good bowel sounds. Extremities: no cyanosis, clubbing, rash, minimal bilateral edema  Neuro: alert & orientedx3, cranial nerves grossly intact. moves all 4 extremities w/o difficulty. Affect pleasant  Telemetry: SR 80s  Labs: Basic Metabolic Panel:  Recent Labs Lab 10/17/13 0400 10/17/13 1501 10/18/13 0424 10/18/13 1455 10/19/13 0411 10/19/13 2028 10/20/13 0439  NA 143 144 144 137 139 136 137  K 2.9* 3.2* 3.0* 2.7* 3.2* 2.9* 3.8  CL 103 103 100 93* 93* 89* 90*  CO2 27 29 32 33* 36*  --  37*  GLUCOSE 112* 113* 105* 141* 97 135* 120*  BUN 71* 72* 68* 64* 59* 52* 54*  CREATININE 2.14* 2.16* 2.12* 1.25 1.99* 2.30* 1.94*  CALCIUM 8.2* 8.4 8.2* 8.5 9.0  --  9.1  PHOS  --  3.6  --   --   --   --   --     Liver Function Tests:  Recent Labs Lab  10/16/13 0404 10/17/13 0400 10/17/13 1501 10/18/13 0424 10/19/13 0411 10/20/13 0439  AST 44* 33  --  29 32 34  ALT 47 43  --  38 37 36  ALKPHOS 70 74  --  76 77 81  BILITOT 1.7* 1.3*  --  1.3* 1.2 0.9  PROT 5.8* 6.0  --  5.9* 5.9* 5.8*  ALBUMIN 2.8* 2.7* 2.7* 2.6* 2.6* 2.6*   No results found for this basename: LIPASE, AMYLASE,  in the last 168 hours No results found for this basename: AMMONIA,  in the last 168 hours  CBC:  Recent Labs Lab 10/16/13 0404 10/17/13 0400 10/18/13 0424 10/19/13 0411 10/19/13 2028 10/20/13 0439  WBC 11.3* 9.7 11.2* 11.8*  --  11.1*  HGB 9.5* 10.2* 10.7* 11.7* 11.2* 11.3*  HCT 28.7* 29.9* 32.2* 33.8* 33.0* 33.2*  MCV 91.1 90.6 90.4 89.7  --  89.7  PLT 461* 445* 496* 513*  --   472*    Cardiac Enzymes: No results found for this basename: CKTOTAL, CKMB, CKMBINDEX, TROPONINI,  in the last 168 hours  BNP: BNP (last 3 results)  Recent Labs  10/07/13 0400 10/17/13 0415  PROBNP 3092.0* 30734.0*    Imaging: No results found.   Medications:     Scheduled Medications: . amiodarone  400 mg Oral BID  . aspirin EC  81 mg Oral Daily  . atorvastatin  80 mg Oral q1800  . bisacodyl  10 mg Oral Daily   Or  . bisacodyl  10 mg Rectal Daily  . budesonide (PULMICORT) nebulizer solution  0.25 mg Nebulization BID  . docusate sodium  200 mg Oral Daily  . enoxaparin (LOVENOX) injection  125 mg Subcutaneous Q12H  . feeding supplement (ENSURE)  1 Container Oral TID BM  . insulin aspart  0-24 Units Subcutaneous Q4H  . levalbuterol  1.25 mg Nebulization TID  . pantoprazole  40 mg Oral Q1200  . potassium chloride  40 mEq Oral BID  . sodium chloride  3 mL Intravenous Q12H  . spironolactone  25 mg Oral Daily  . warfarin  2.5 mg Oral q1800  . Warfarin - Physician Dosing Inpatient   Does not apply q1800    Infusions: . sodium chloride 20 mL/hr at 10/19/13 0900  . DOPamine 3 mcg/kg/min (10/20/13 0800)  . milrinone 0.2 mcg/kg/min (10/20/13 0800)    PRN Medications: ALPRAZolam, levalbuterol, metoprolol, ondansetron (ZOFRAN) IV, oxyCODONE, RESOURCE THICKENUP CLEAR, sodium chloride, sodium chloride, traMADol   Assessment:   1) CAD   S/p CABG x 2 10/08/13 2) STEMI 3) A/C systolic HF    EF 30-35% 4) HTN 5) OSA 6) Severe Anxiety 7) Respiratory failure,   -- vent-mask 50% 8) Afib 9) AKI 10) DVT   -- R peroneal vein   -- on lovenox and coumadin 11) Hypokalemia  Plan/Discussion:    Volume status much better. Renal function improving slowly. Weight still about 5 pounds over pre-op. Nephrology stopped lasix and continued spiro. Will add back lasix 40 po daily.   Has not had any further AF. Decrease amio to 200 bid.   Stop dopamine. Wean milrinone to 0.1. Can  go to floor.  Will likely stop milrinone tomorrow.  Agree with heparin/coumadin for DVT.  INR dropped 1.6 -> 1.5. Pharmacy dosing.   No ACE or b-blocker at this point due to renal failure and decompensation.   May be candidate for CIR  Will see what PT recommends.   Ashlynne Shetterly,MD 9:49 AM

## 2013-10-21 ENCOUNTER — Inpatient Hospital Stay (HOSPITAL_COMMUNITY): Payer: Medicare Other

## 2013-10-21 LAB — CBC
HCT: 30.9 % — ABNORMAL LOW (ref 39.0–52.0)
Hemoglobin: 10.3 g/dL — ABNORMAL LOW (ref 13.0–17.0)
MCH: 30.3 pg (ref 26.0–34.0)
MCHC: 33.3 g/dL (ref 30.0–36.0)
MCV: 90.9 fL (ref 78.0–100.0)
Platelets: 407 10*3/uL — ABNORMAL HIGH (ref 150–400)
RBC: 3.4 MIL/uL — ABNORMAL LOW (ref 4.22–5.81)
RDW: 13.9 % (ref 11.5–15.5)
WBC: 10.3 10*3/uL (ref 4.0–10.5)

## 2013-10-21 LAB — IRON AND TIBC
Iron: 38 ug/dL — ABNORMAL LOW (ref 42–135)
Saturation Ratios: 21 % (ref 20–55)
TIBC: 185 ug/dL — ABNORMAL LOW (ref 215–435)
UIBC: 147 ug/dL (ref 125–400)

## 2013-10-21 LAB — CULTURE, BLOOD (SINGLE): Culture: NO GROWTH

## 2013-10-21 LAB — GLUCOSE, CAPILLARY
Glucose-Capillary: 105 mg/dL — ABNORMAL HIGH (ref 70–99)
Glucose-Capillary: 83 mg/dL (ref 70–99)
Glucose-Capillary: 95 mg/dL (ref 70–99)
Glucose-Capillary: 96 mg/dL (ref 70–99)

## 2013-10-21 LAB — CARBOXYHEMOGLOBIN
Carboxyhemoglobin: 1.9 % — ABNORMAL HIGH (ref 0.5–1.5)
Methemoglobin: 1.6 % — ABNORMAL HIGH (ref 0.0–1.5)
O2 Saturation: 60.2 %
Total hemoglobin: 10.1 g/dL — ABNORMAL LOW (ref 13.5–18.0)

## 2013-10-21 LAB — COMPREHENSIVE METABOLIC PANEL
ALT: 44 U/L (ref 0–53)
AST: 47 U/L — ABNORMAL HIGH (ref 0–37)
Albumin: 2.4 g/dL — ABNORMAL LOW (ref 3.5–5.2)
Alkaline Phosphatase: 77 U/L (ref 39–117)
BUN: 51 mg/dL — ABNORMAL HIGH (ref 6–23)
CO2: 38 mEq/L — ABNORMAL HIGH (ref 19–32)
Calcium: 8.7 mg/dL (ref 8.4–10.5)
Chloride: 91 mEq/L — ABNORMAL LOW (ref 96–112)
Creatinine, Ser: 1.8 mg/dL — ABNORMAL HIGH (ref 0.50–1.35)
GFR calc Af Amer: 40 mL/min — ABNORMAL LOW (ref >90)
GFR calc non Af Amer: 35 mL/min — ABNORMAL LOW (ref >90)
Glucose, Bld: 88 mg/dL (ref 70–99)
Potassium: 4.1 mEq/L (ref 3.5–5.1)
Sodium: 135 mEq/L (ref 135–145)
Total Bilirubin: 1 mg/dL (ref 0.3–1.2)
Total Protein: 5.3 g/dL — ABNORMAL LOW (ref 6.0–8.3)

## 2013-10-21 LAB — PROTIME-INR
INR: 1.39 (ref 0.00–1.49)
Prothrombin Time: 16.7 seconds — ABNORMAL HIGH (ref 11.6–15.2)

## 2013-10-21 MED ORDER — FUROSEMIDE 80 MG PO TABS
80.0000 mg | ORAL_TABLET | Freq: Two times a day (BID) | ORAL | Status: DC
  Administered 2013-10-21 (×2): 80 mg via ORAL
  Filled 2013-10-21 (×5): qty 1
  Filled 2013-10-21: qty 2

## 2013-10-21 MED ORDER — GLUCERNA SHAKE PO LIQD
237.0000 mL | Freq: Three times a day (TID) | ORAL | Status: DC
  Administered 2013-10-21 – 2013-10-25 (×9): 237 mL via ORAL

## 2013-10-21 MED ORDER — MIDODRINE HCL 5 MG PO TABS
10.0000 mg | ORAL_TABLET | Freq: Two times a day (BID) | ORAL | Status: DC
  Administered 2013-10-22: 10 mg via ORAL
  Filled 2013-10-21 (×4): qty 2

## 2013-10-21 MED ORDER — INSULIN ASPART 100 UNIT/ML ~~LOC~~ SOLN: 0.0000 [IU] | Freq: Three times a day (TID) | SUBCUTANEOUS | Status: DC

## 2013-10-21 MED ORDER — FUROSEMIDE 80 MG PO TABS
80.0000 mg | ORAL_TABLET | Freq: Every day | ORAL | Status: DC
  Filled 2013-10-21: qty 1

## 2013-10-21 MED ORDER — WARFARIN SODIUM 5 MG PO TABS
5.0000 mg | ORAL_TABLET | Freq: Every day | ORAL | Status: DC
  Administered 2013-10-21 – 2013-10-23 (×3): 5 mg via ORAL
  Filled 2013-10-21 (×5): qty 1

## 2013-10-21 NOTE — Progress Notes (Addendum)
NUTRITION CONSULT/FOLLOW UP  Intervention:    Glucerna Shake twice daily (220 kcals, 10 gm protein per 8 fl oz bottle) RD to follow for nutrition care plan  Nutrition Dx:   Inadequate oral intake, improved  Goal:   Pt to meet >/= 90% of their estimated nutrition needs, progressing  Monitor:   PO & supplemental intake, weight, labs, I/O's  Assessment:   Patient with PMH of HTN, OSA, obesity, AAA with anxiety; patient developed substernal chest pressure and his daughter called EMS; brought emergently to cardiac cath lab where he was evaluated -- CVTS consulted.   Patient s/p procedures 11/4:  CORONARY ARTERY BYPASS GRAFTING (CABG)  INTRAOPERATIVE TRANSESOPHAGEAL ECHOCARDIOGRAM  [facility] RIGHT THIGH  Patient's appetite better.  Diet upgraded to thin liquids.  PO intake variable at 20-50% per flowsheet records.  Ensure Pudding supplements in place; patient likes drink better -- RD to change order.    Advanced Heart Failure Round Note reviewed -- patient's volume status better; renal function slowly improving.  RD re-consulted for diet education.  Patient awaiting to work with SLP.  Provided "Low Sodium Therapy" & "Sodium Free Flavoring Tips" handouts to patient's daughter for review.  RD available to answer questions and/or concerns.  Height: Ht Readings from Last 1 Encounters:  10/07/13 6\' 1"  (1.854 m)    Weight Status:   Wt Readings from Last 1 Encounters:  10/21/13 257 lb 0.9 oz (116.6 kg)    Re-estimated needs:  Kcal: 2150-2350 Protein: 125-135 gm Fluid: 2.1-2.3 L  Skin: chest & leg surgical incisions   Diet Order: Carb Control   Intake/Output Summary (Last 24 hours) at 10/21/13 1138 Last data filed at 10/21/13 1100  Gross per 24 hour  Intake 1876.56 ml  Output   1805 ml  Net  71.56 ml    Labs:   Recent Labs Lab 10/17/13 0400 10/17/13 1501  10/19/13 0411 10/19/13 2028 10/20/13 0439 10/21/13 0400  NA 143 144  < > 139 136 137 135  K 2.9* 3.2*  < > 3.2*  2.9* 3.8 4.1  CL 103 103  < > 93* 89* 90* 91*  CO2 27 29  < > 36*  --  37* 38*  BUN 71* 72*  < > 59* 52* 54* 51*  CREATININE 2.14* 2.16*  < > 1.99* 2.30* 1.94* 1.80*  CALCIUM 8.2* 8.4  < > 9.0  --  9.1 8.7  PHOS  --  3.6  --   --   --   --   --   GLUCOSE 112* 113*  < > 97 135* 120* 88  < > = values in this interval not displayed.  CBG (last 3)   Recent Labs  10/21/13 10/21/13 0422 10/21/13 0748  GLUCAP 95 83 96    Scheduled Meds: . aspirin EC  81 mg Oral Daily  . atorvastatin  80 mg Oral q1800  . bisacodyl  10 mg Oral Daily   Or  . bisacodyl  10 mg Rectal Daily  . budesonide (PULMICORT) nebulizer solution  0.25 mg Nebulization BID  . docusate sodium  200 mg Oral Daily  . enoxaparin (LOVENOX) injection  125 mg Subcutaneous Q12H  . feeding supplement (ENSURE)  1 Container Oral TID BM  . furosemide  80 mg Oral BID  . insulin aspart  0-15 Units Subcutaneous TID WC  . levalbuterol  1.25 mg Nebulization TID  . pantoprazole  40 mg Oral Q1200  . sodium chloride  10-40 mL Intracatheter Q12H  . spironolactone  25 mg Oral Daily  . warfarin  5 mg Oral q1800  . Warfarin - Physician Dosing Inpatient   Does not apply q1800    Continuous Infusions: . sodium chloride 1 mL (10/21/13 0755)    Maureen Chatters, RD, LDN Pager #: [phone] After-Hours Pager #: [phone]

## 2013-10-21 NOTE — Progress Notes (Signed)
13 Days Post-Op Procedure(s) (LRB): CORONARY ARTERY BYPASS GRAFTING (CABG) (N/A) INTRAOPERATIVE TRANSESOPHAGEAL ECHOCARDIOGRAM (N/A) Subjective: Walked in hall today Patient has chronic problems with ortostatic hypotension which has been a problem postop- will start midodrine 10 bid  Objective: Vital signs in last 24 hours: Temp:  [97.8 F (36.6 C)-98.4 F (36.9 C)] 97.8 F (36.6 C) (11/17 1528) Pulse Rate:  [70-87] 86 (11/17 1600) Cardiac Rhythm:  [-] Atrial paced (11/17 1600) Resp:  [11-22] 16 (11/17 1600) BP: (91-126)/(50-80) 122/59 mmHg (11/17 1600) SpO2:  [95 %-100 %] 100 % (11/17 1600) Weight:  [257 lb 0.9 oz (116.6 kg)] 257 lb 0.9 oz (116.6 kg) (11/17 0427)  Hemodynamic parameters for last 24 hours: CVP:  [5 mmHg-14 mmHg] 13 mmHg  Intake/Output from previous day: 11/16 0701 - 11/17 0700 In: 2101.2 [P.O.:1460; I.V.:641.2] Out: 1850 [Urine:1850] Intake/Output this shift: Total I/O In: 402.6 [P.O.:300; I.V.:102.6] Out: 1525 [Urine:1525]  Incision clean and dry- no edema  Lab Results:  Recent Labs  10/20/13 0439 10/21/13 0400  WBC 11.1* 10.3  HGB 11.3* 10.3*  HCT 33.2* 30.9*  PLT 472* 407*   BMET:  Recent Labs  10/20/13 0439 10/21/13 0400  NA 137 135  K 3.8 4.1  CL 90* 91*  CO2 37* 38*  GLUCOSE 120* 88  BUN 54* 51*  CREATININE 1.94* 1.80*  CALCIUM 9.1 8.7    PT/INR:  Recent Labs  10/21/13 0740  LABPROT 16.7*  INR 1.39   ABG    Component Value Date/Time   PHART 7.425 10/14/2013 0441   HCO3 25.1* 10/14/2013 0441   TCO2 34 10/19/2013 2028   ACIDBASEDEF 2.0 10/08/2013 1524   O2SAT 60.2 10/21/2013 0426   CBG (last 3)   Recent Labs  10/21/13 0748 10/21/13 1147 10/21/13 1706  GLUCAP 96 113* 105*    Assessment/Plan: S/P Procedure(s) (LRB): CORONARY ARTERY BYPASS GRAFTING (CABG) (N/A) INTRAOPERATIVE TRANSESOPHAGEAL ECHOCARDIOGRAM (N/A) Plan tx to stepdown in am   LOS: 16 days    Antonio Bernard,Antonio Bernard 10/21/2013

## 2013-10-21 NOTE — Progress Notes (Signed)
Physical Therapy Treatment Patient Details Name: Antonio Bernard MRN: [id number] DOB: 04-15-1933 Today's Date: 10/21/2013 Time: 9528-4132 PT Time Calculation (min): 30 min  PT Assessment / Plan / Recommendation  History of Present Illness Adm 11/01 with MI; 11/05 CABG x 2 and remained on vent until 11/09   PT Comments   Pt making progress with mobility.  Pt still with decr BP.  Follow Up Recommendations  CIR     Does the patient have the potential to tolerate intense rehabilitation     Barriers to Discharge        Equipment Recommendations  None recommended by PT    Recommendations for Other Services    Frequency Min 3X/week   Progress towards PT Goals Progress towards PT goals: Progressing toward goals  Plan Current plan remains appropriate    Precautions / Restrictions Precautions Precautions: Sternal;Fall Precaution Comments: Pt orthostatic.   Pertinent Vitals/Pain See flow sheet.    Mobility  Bed Mobility Bed Mobility: Sit to Sidelying Right Supine to Sit: 4: Min assist;HOB elevated Sitting - Scoot to Edge of Bed: 4: Min assist Sit to Sidelying Right: 4: Min assist;HOB flat Details for Bed Mobility Assistance: Assist to bring trunk up and cues to maintain sternal precautions.  Assist to bring feet back up into bed. Transfers Sit to Stand: 1: +2 Total assist;From bed;From chair/3-in-1;Without upper extremity assist Sit to Stand: Patient Percentage: 80% Stand to Sit: 1: +2 Total assist;Without upper extremity assist;To bed;To chair/3-in-1 Stand to Sit: Patient Percentage: 80% Details for Transfer Assistance: Verbal/tactile cues to put hands on knees for sit to stand to adhere to sternal precautions. Ambulation/Gait Ambulation/Gait Assistance: 1: +2 Total assist Ambulation/Gait: Patient Percentage: 90% Ambulation Distance (Feet): 150 Feet (1 sitting rest break) Assistive device: Other (Comment) (pushing w/c) Ambulation/Gait Assistance Details: +2 for lines  and to follow with chair due to orthostasis. Gait Pattern: Step-through pattern;Decreased stride length;Wide base of support Gait velocity: decr    Exercises     PT Diagnosis:    PT Problem List:   PT Treatment Interventions:     PT Goals (current goals can now be found in the care plan section)    Visit Information  Last PT Received On: 10/21/13 Assistance Needed: +2 History of Present Illness: Adm 11/01 with MI; 11/05 CABG x 2 and remained on vent until 11/09    Subjective Data      Cognition  Cognition Arousal/Alertness: Awake/alert Behavior During Therapy: WFL for tasks assessed/performed Overall Cognitive Status: Within Functional Limits for tasks assessed    Balance  Static Standing Balance Static Standing - Balance Support: Bilateral upper extremity supported Static Standing - Level of Assistance: 4: Min assist  End of Session PT - End of Session Equipment Utilized During Treatment: Oxygen;Gait belt Activity Tolerance: Patient tolerated treatment well Patient left: in bed;with call bell/phone within reach;with family/visitor present Nurse Communication: Mobility status   GP     Ambur Province 10/21/2013, 10:15 AM  [employer] PT [phone]

## 2013-10-21 NOTE — Progress Notes (Signed)
Clinical Social Work Department BRIEF PSYCHOSOCIAL ASSESSMENT 10/21/2013  Patient:  Antonio Bernard, Antonio Bernard     Account Number:  [id number]     Admit date:  10/05/2013  Clinical Social Worker:  Carren Rang  Date/Time:  10/21/2013 03:50 PM  Referred by:    Date Referred:  10/21/2013 Referred for  SNF Placement   Other Referral:   Interview type:  Patient Other interview type:    PSYCHOSOCIAL DATA Living Status:  ALONE Admitted from facility:   Level of care:   Primary support name:  Marlou Starks Primary support relationship to patient:  CHILD, ADULT Degree of support available:   Good, very supportive family and church    CURRENT CONCERNS Current Concerns  Post-Acute Placement   Other Concerns:    SOCIAL WORK ASSESSMENT / PLAN CSW reviewed chart and noticed PT recommending CIR. CSW went into room and introduced self and explained reason for visit. CSW explained to patient that csw is doing a backup SNF plan, just incase patient is unable to go to CIR. Patient was hesitantly agreeable, stating that he has been to many facilities as a Education officer, environmental and does not really want to go to a skilled nursing facility and prefers CIR. CSW will contact patient's family and will assist with dc plan.   Assessment/plan status:   Other assessment/ plan:   Information/referral to community resources:   SNF information.    PATIENT'S/FAMILY'S RESPONSE TO PLAN OF CARE: Patient states he would rather go to CIR and has a very supportive family and church community.       Maree Krabbe, MSW, [facility] [phone]

## 2013-10-21 NOTE — Progress Notes (Signed)
Clinical Social Work Department CLINICAL SOCIAL WORK PLACEMENT NOTE 10/21/2013  Patient:  TALON, REGALA  Account Number:  [id number] Admit date:  10/05/2013  Clinical Social Worker:  Carren Rang  Date/time:  10/21/2013 04:00 PM  Clinical Social Work is seeking post-discharge placement for this patient at the following level of care:   SKILLED NURSING   (*CSW will update this form in Epic as items are completed)   10/21/2013  Patient/family provided with [facility] Department of Clinical Social Work's list of facilities offering this level of care within the geographic area requested by the patient (or if unable, by the patient's family).  10/21/2013  Patient/family informed of their freedom to choose among providers that offer the needed level of care, that participate in Medicare, Medicaid or managed care program needed by the patient, have an available bed and are willing to accept the patient.  10/21/2013  Patient/family informed of [facility]' ownership interest in [facility], as well as of the fact that they are under no obligation to receive care at this facility.  PASARR submitted to EDS on 10/21/2013 PASARR number received from EDS on 10/21/2013  FL2 transmitted to all facilities in geographic area requested by pt/family on  10/21/2013 FL2 transmitted to all facilities within larger geographic area on   Patient informed that his/her managed care company has contracts with or will negotiate with  certain facilities, including the following:     Patient/family informed of bed offers received:   Patient chooses bed at  Physician recommends and patient chooses bed at    Patient to be transferred to  on   Patient to be transferred to facility by   The following physician request were entered in Epic:   Additional Comments:  Maree Krabbe, MSW, [employer] [phone]

## 2013-10-21 NOTE — Progress Notes (Signed)
Subjective: Interval History: none.  Objective: Vital signs in last 24 hours: Temp:  [97.4 F (36.3 C)-98.2 F (36.8 C)] 97.8 F (36.6 C) (11/17 0747) Pulse Rate:  [70-92] 76 (11/17 0712) Resp:  [9-21] 15 (11/17 0712) BP: (85-118)/(45-66) 112/56 mmHg (11/17 0712) SpO2:  [94 %-100 %] 99 % (11/17 0712) Weight:  [116.6 kg (257 lb 0.9 oz)] 116.6 kg (257 lb 0.9 oz) (11/17 0427) Weight change: 0.3 kg (10.6 oz)  Intake/Output from previous day: 11/16 0701 - 11/17 0700 In: 2101.2 [P.O.:1460; I.V.:641.2] Out: 1850 [Urine:1850] Intake/Output this shift:    General appearance: alert, cooperative and pale Resp: rales bibasilar Cardio: S1, S2 normal and systolic murmur: holosystolic 2/6, blowing at apex GI: pos bs,.liver down 4 cm, soft Extremities: edema 3+  Lab Results:  Recent Labs  10/20/13 0439 10/21/13 0400  WBC 11.1* 10.3  HGB 11.3* 10.3*  HCT 33.2* 30.9*  PLT 472* 407*   BMET:  Recent Labs  10/20/13 0439 10/21/13 0400  NA 137 135  K 3.8 4.1  CL 90* 91*  CO2 37* 38*  GLUCOSE 120* 88  BUN 54* 51*  CREATININE 1.94* 1.80*  CALCIUM 9.1 8.7   No results found for this basename: PTH,  in the last 72 hours Iron Studies: No results found for this basename: IRON, TIBC, TRANSFERRIN, FERRITIN,  in the last 72 hours  Studies/Results: Dg Chest Port 1 View  10/21/2013   CLINICAL DATA:  Respiratory distress  EXAM: PORTABLE CHEST - 1 VIEW  COMPARISON:  10/17/2013  FINDINGS: There has been further improvement. Less opacity is noted at the bases, with the residual opacity likely atelectasis. No pulmonary edema is evident.  Cardiac silhouette is normal in size. Mediastinum is normal in contour. No pneumothorax.  Right PICC is stable well-positioned.  IMPRESSION: 1. Further improvement in lung aeration. No residual edema. Mild persistent lung base opacity most evident on the left is likely atelectasis.   Electronically Signed   By: Amie Portland M.D.   On: 10/21/2013 07:20    I  have reviewed the patient's current medications.  Assessment/Plan: 1 AKI with residual signif deficit.  May get some better yet, but doubt a lot.  Still vol xs. 2 CAD per CVS 3 Anemia check Fe 4 OSA 5 Obesity 6 DJD P ^ lasix, check fe, goal neg fluid balance, follow Cr. D/C foley soon    LOS: 16 days   Kirtan Sada L 10/21/2013,7:56 AM

## 2013-10-21 NOTE — Progress Notes (Signed)
Advanced Heart Failure Rounding Note   Subjective:     Antonio Bernard is a 79 yo male with a hx of HTN, severe anxiety, and OSA who presented to the ED 10/05/13 with a STEMI and was taken to the cath lab. Cath revealed proximally occluded LAD - after flow was restored it was apparently there was extensive LAD disease as well as severe 90-95% ostial D1 disease . There was moderate to severe focal disease of the sub-branches as well. Balloon angioplasty was performed, but no stent was placed. TIMI 2 flow was noted in the LAD and an IABP was placed. He was then evaluated by TCTS and was taken for a CABG x2 LIMA-LAD; SVG-DIAG on 10/08/13. After CABG he was brought back to the ICU still on IABP and multiple pressors. The balloon pump was weaned off 10/10/13 and he was extubated 10/13/13. Patient developed transient afib on 11/11 and was started on Amiodarone gtt.   11/11/4 Venous doppler acute deep DVT involving R peroneal vein 10/15/13 EF 30-35%. RV ok.   POD # 13 Off dopamine. On milrinone 0.1. Marland KitchenSwitched to po lasix yesterday. I/Os slightly positive. Much better mood. Eager to walk. No dyspnea.   Cr 2.1->1.6->2.1 -> 1.25 (erroneous) -> 1.99 -> 1.94 -> 1.80 Co-ox 63%-> 55%-> 62%-> 68%-> 60%  Objective:   Weight Range:  Vital Signs:   Temp:  [97.4 F (36.3 C)-98.2 F (36.8 C)] 97.8 F (36.6 C) (11/17 0400) Pulse Rate:  [70-92] 76 (11/17 0712) Resp:  [9-21] 15 (11/17 0712) BP: (85-118)/(45-66) 112/56 mmHg (11/17 0712) SpO2:  [94 %-100 %] 99 % (11/17 0712) Weight:  [116.6 kg (257 lb 0.9 oz)] 116.6 kg (257 lb 0.9 oz) (11/17 0427) Last BM Date: 10/18/13  Weight change: Filed Weights   10/19/13 0500 10/20/13 0700 10/21/13 0427  Weight: 119.7 kg (263 lb 14.3 oz) 116.3 kg (256 lb 6.3 oz) 116.6 kg (257 lb 0.9 oz)    Intake/Output:   Intake/Output Summary (Last 24 hours) at 10/21/13 0740 Last data filed at 10/21/13 0700  Gross per 24 hour  Intake 2101.2 ml  Output   1850 ml  Net  251.2 ml      Physical Exam: General:  Sitting in bed. Feeling much better HEENT: normal Neck: supple. JVP ~11. Carotids 2+ bilat; no bruits. No lymphadenopathy or thryomegaly appreciated. Cor: PMI nondisplaced. Regular rate & rhythm. No rubs, gallops or murmurs. Lungs: clear Abdomen: soft, nontender, non-distended. No hepatosplenomegaly. No bruits or masses. Good bowel sounds. Extremities: no cyanosis, clubbing, rash, mild bilateral edema  Neuro: alert & orientedx3, cranial nerves grossly intact. moves all 4 extremities w/o difficulty. Affect pleasant  Telemetry: SR 80s  Labs: Basic Metabolic Panel:  Recent Labs Lab 10/17/13 0400 10/17/13 1501 10/18/13 0424 10/18/13 1455 10/19/13 0411 10/19/13 2028 10/20/13 0439 10/21/13 0400  NA 143 144 144 137 139 136 137 135  K 2.9* 3.2* 3.0* 2.7* 3.2* 2.9* 3.8 4.1  CL 103 103 100 93* 93* 89* 90* 91*  CO2 27 29 32 33* 36*  --  37* 38*  GLUCOSE 112* 113* 105* 141* 97 135* 120* 88  BUN 71* 72* 68* 64* 59* 52* 54* 51*  CREATININE 2.14* 2.16* 2.12* 1.25 1.99* 2.30* 1.94* 1.80*  CALCIUM 8.2* 8.4 8.2* 8.5 9.0  --  9.1 8.7  PHOS  --  3.6  --   --   --   --   --   --     Liver Function Tests:  Recent Labs Lab  10/17/13 0400 10/17/13 1501 10/18/13 0424 10/19/13 0411 10/20/13 0439 10/21/13 0400  AST 33  --  29 32 34 47*  ALT 43  --  38 37 36 44  ALKPHOS 74  --  76 77 81 77  BILITOT 1.3*  --  1.3* 1.2 0.9 1.0  PROT 6.0  --  5.9* 5.9* 5.8* 5.3*  ALBUMIN 2.7* 2.7* 2.6* 2.6* 2.6* 2.4*   No results found for this basename: LIPASE, AMYLASE,  in the last 168 hours No results found for this basename: AMMONIA,  in the last 168 hours  CBC:  Recent Labs Lab 10/17/13 0400 10/18/13 0424 10/19/13 0411 10/19/13 2028 10/20/13 0439 10/21/13 0400  WBC 9.7 11.2* 11.8*  --  11.1* 10.3  HGB 10.2* 10.7* 11.7* 11.2* 11.3* 10.3*  HCT 29.9* 32.2* 33.8* 33.0* 33.2* 30.9*  MCV 90.6 90.4 89.7  --  89.7 90.9  PLT 445* 496* 513*  --  472* 407*    Cardiac  Enzymes: No results found for this basename: CKTOTAL, CKMB, CKMBINDEX, TROPONINI,  in the last 168 hours  BNP: BNP (last 3 results)  Recent Labs  10/07/13 0400 10/17/13 0415  PROBNP 3092.0* 30734.0*    Imaging: Dg Chest Port 1 View  10/21/2013   CLINICAL DATA:  Respiratory distress  EXAM: PORTABLE CHEST - 1 VIEW  COMPARISON:  10/17/2013  FINDINGS: There has been further improvement. Less opacity is noted at the bases, with the residual opacity likely atelectasis. No pulmonary edema is evident.  Cardiac silhouette is normal in size. Mediastinum is normal in contour. No pneumothorax.  Right PICC is stable well-positioned.  IMPRESSION: 1. Further improvement in lung aeration. No residual edema. Mild persistent lung base opacity most evident on the left is likely atelectasis.   Electronically Signed   By: Amie Portland M.D.   On: 10/21/2013 07:20     Medications:     Scheduled Medications: . amiodarone  200 mg Oral BID  . aspirin EC  81 mg Oral Daily  . atorvastatin  80 mg Oral q1800  . bisacodyl  10 mg Oral Daily   Or  . bisacodyl  10 mg Rectal Daily  . budesonide (PULMICORT) nebulizer solution  0.25 mg Nebulization BID  . docusate sodium  200 mg Oral Daily  . enoxaparin (LOVENOX) injection  125 mg Subcutaneous Q12H  . feeding supplement (ENSURE)  1 Container Oral TID BM  . furosemide  80 mg Oral Daily  . insulin aspart  0-24 Units Subcutaneous Q4H  . levalbuterol  1.25 mg Nebulization TID  . pantoprazole  40 mg Oral Q1200  . potassium chloride  40 mEq Oral BID  . sodium chloride  10-40 mL Intracatheter Q12H  . spironolactone  25 mg Oral Daily  . warfarin  5 mg Oral q1800  . Warfarin - Physician Dosing Inpatient   Does not apply q1800    Infusions: . sodium chloride 20 mL/hr at 10/20/13 1400  . milrinone 0.1 mcg/kg/min (10/20/13 1500)    PRN Medications: ALPRAZolam, levalbuterol, metoprolol, ondansetron (ZOFRAN) IV, oxyCODONE, RESOURCE THICKENUP CLEAR, sodium chloride,  traMADol   Assessment:   1) CAD   S/p CABG x 2 10/08/13 2) STEMI 3) A/C systolic HF    EF 30-35% 4) HTN 5) OSA 6) Severe Anxiety 7) Respiratory failure,   -- vent-mask 50% 8) Afib 9) AKI 10) DVT   -- R peroneal vein   -- on lovenox and coumadin 11) Hypokalemia  Plan/Discussion:    Doing well.. Volume status  much better. Renal function improving slowly. Weight still about 5-7 pounds over pre-op. Increase lasix to 80 daily. Continue aldactone.  Has not had any further AF. Can stop amio.   Off dopamine. Can stop milrinone. Seems ok to go to floor and focus on ambulation/rehab/nutrition.   Agree with heparin/coumadin for DVT. Pharmacy dosing.   No ACE or b-blocker at this point due to renal failure and decompensation. May start low-dose carvedilol soon.  Rowene Suto,MD 7:40 AM

## 2013-10-22 LAB — COMPREHENSIVE METABOLIC PANEL
ALT: 54 U/L — ABNORMAL HIGH (ref 0–53)
Alkaline Phosphatase: 80 U/L (ref 39–117)
BUN: 44 mg/dL — ABNORMAL HIGH (ref 6–23)
CO2: 41 mEq/L (ref 19–32)
Calcium: 9.1 mg/dL (ref 8.4–10.5)
Chloride: 89 mEq/L — ABNORMAL LOW (ref 96–112)
GFR calc Af Amer: 44 mL/min — ABNORMAL LOW (ref >90)
GFR calc non Af Amer: 38 mL/min — ABNORMAL LOW (ref >90)
Glucose, Bld: 92 mg/dL (ref 70–99)
Potassium: 3.8 mEq/L (ref 3.5–5.1)
Sodium: 136 mEq/L (ref 135–145)
Total Protein: 5.9 g/dL — ABNORMAL LOW (ref 6.0–8.3)

## 2013-10-22 LAB — GLUCOSE, CAPILLARY
Glucose-Capillary: 90 mg/dL (ref 70–99)
Glucose-Capillary: 84 mg/dL (ref 70–99)
Glucose-Capillary: 93 mg/dL (ref 70–99)
Glucose-Capillary: 106 mg/dL — ABNORMAL HIGH (ref 70–99)
Glucose-Capillary: 101 mg/dL — ABNORMAL HIGH (ref 70–99)

## 2013-10-22 LAB — CARBOXYHEMOGLOBIN
Carboxyhemoglobin: 2 % — ABNORMAL HIGH (ref 0.5–1.5)
Methemoglobin: 0.9 % (ref 0.0–1.5)
O2 Saturation: 75.2 %
Total hemoglobin: 10 g/dL — ABNORMAL LOW (ref 13.5–18.0)

## 2013-10-22 LAB — PROTIME-INR
INR: 1.4 (ref 0.00–1.49)
Prothrombin Time: 16.8 seconds — ABNORMAL HIGH (ref 11.6–15.2)

## 2013-10-22 LAB — CBC
HCT: 31.4 % — ABNORMAL LOW (ref 39.0–52.0)
Hemoglobin: 10.2 g/dL — ABNORMAL LOW (ref 13.0–17.0)
MCH: 29.7 pg (ref 26.0–34.0)
MCHC: 32.5 g/dL (ref 30.0–36.0)
MCV: 91.3 fL (ref 78.0–100.0)
Platelets: 394 10*3/uL (ref 150–400)
RBC: 3.44 MIL/uL — ABNORMAL LOW (ref 4.22–5.81)
RDW: 13.8 % (ref 11.5–15.5)
WBC: 8.7 10*3/uL (ref 4.0–10.5)

## 2013-10-22 LAB — PHOSPHORUS: Phosphorus: 4.7 mg/dL — ABNORMAL HIGH (ref 2.3–4.6)

## 2013-10-22 MED ORDER — MIDODRINE HCL 5 MG PO TABS
5.0000 mg | ORAL_TABLET | Freq: Two times a day (BID) | ORAL | Status: DC
  Administered 2013-10-22: 5 mg via ORAL
  Filled 2013-10-22 (×5): qty 1

## 2013-10-22 MED ORDER — CARVEDILOL 3.125 MG PO TABS
3.1250 mg | ORAL_TABLET | Freq: Two times a day (BID) | ORAL | Status: DC
  Administered 2013-10-22 – 2013-10-25 (×7): 3.125 mg via ORAL
  Filled 2013-10-22 (×10): qty 1

## 2013-10-22 MED ORDER — FUROSEMIDE 40 MG PO TABS
40.0000 mg | ORAL_TABLET | Freq: Every day | ORAL | Status: DC
  Administered 2013-10-22: 40 mg via ORAL

## 2013-10-22 MED ORDER — POTASSIUM CHLORIDE CRYS ER 20 MEQ PO TBCR
40.0000 meq | EXTENDED_RELEASE_TABLET | Freq: Once | ORAL | Status: AC
  Administered 2013-10-22: 40 meq via ORAL

## 2013-10-22 MED ORDER — WARFARIN - PHYSICIAN DOSING INPATIENT: Freq: Every day | Status: DC

## 2013-10-22 MED ORDER — SODIUM CHLORIDE 0.9 % IJ SOLN: 3.0000 mL | INTRAMUSCULAR | Status: DC | PRN

## 2013-10-22 MED ORDER — SODIUM CHLORIDE 0.9 % IJ SOLN: 3.0000 mL | Freq: Two times a day (BID) | INTRAMUSCULAR | Status: DC

## 2013-10-22 MED ORDER — FUROSEMIDE 80 MG PO TABS
80.0000 mg | ORAL_TABLET | Freq: Every day | ORAL | Status: DC
  Administered 2013-10-23 – 2013-10-25 (×3): 80 mg via ORAL
  Filled 2013-10-22 (×3): qty 1

## 2013-10-22 MED ORDER — FUROSEMIDE 80 MG PO TABS
80.0000 mg | ORAL_TABLET | Freq: Every day | ORAL | Status: DC
  Filled 2013-10-22: qty 1

## 2013-10-22 MED ORDER — MOVING RIGHT ALONG BOOK
Freq: Once | Status: AC
  Administered 2013-10-22: 08:00:00
  Filled 2013-10-22: qty 1

## 2013-10-22 MED ORDER — SODIUM CHLORIDE 0.9 % IV SOLN: 250.0000 mL | INTRAVENOUS | Status: DC | PRN

## 2013-10-22 NOTE — Plan of Care (Signed)
Problem: Problem: Mobility Progression Goal: NO EVIDENCE OF SHORTNESS OF BREATH WITH EXERTION Outcome: Progressing O2 discontinued

## 2013-10-22 NOTE — Progress Notes (Signed)
P.T. Recommended inpt rehab consult on 11/12. No order yet for consult. Please order an inpt rehab consult if you would like an assessment for admission to inpt rehab. [phone]

## 2013-10-22 NOTE — Progress Notes (Signed)
0.5 mg PO Xanax given at this time per pt request; will cont. To monitor.

## 2013-10-22 NOTE — Progress Notes (Signed)
PT Cancellation Note  Patient Details Name: DESTON BILYEU MRN: [id number] DOB: 02-05-1933   Cancelled Treatment:    Reason Eval/Treat Not Completed: Other (comment) (Pt walked earlier with nursing.) Will check back later as time allows.   Arif Amendola 10/22/2013, 12:20 PM

## 2013-10-22 NOTE — Progress Notes (Signed)
Advanced Heart Failure Rounding Note   Subjective:     Antonio Bernard is a 79 yo male with a hx of HTN, severe anxiety, and OSA who presented to the ED 10/05/13 with a STEMI and was taken to the cath lab. Cath revealed proximally occluded LAD - after flow was restored it was apparently there was extensive LAD disease as well as severe 90-95% ostial D1 disease . There was moderate to severe focal disease of the sub-branches as well. Balloon angioplasty was performed, but no stent was placed. TIMI 2 flow was noted in the LAD and an IABP was placed. He was then evaluated by TCTS and was taken for a CABG x2 LIMA-LAD; SVG-DIAG on 10/08/13. After CABG he was brought back to the ICU still on IABP and multiple pressors. The balloon pump was weaned off 10/10/13 and he was extubated 10/13/13. Patient developed transient afib on 11/11 and was started on Amiodarone gtt.   11/11/4 Venous doppler acute deep DVT involving R peroneal vein 10/15/13 EF 30-35%. RV ok.   POD # 14 Off dopamine. Yesterday Milrinone was stopped and lasix was increased to 80 mg po BID. Weight down 6 pounds. 24 hour I.O - 3.6 liters   Denies SOB/Orthopnea. Wants to walk. On po lasix.   Cr 2.1->1.6->2.1 ->1.99 -> 1.94 -> 1.80> 1.6 Co-ox 63%-> 55%-> 62%-> 68%-> 60%  Objective:   Weight Range:  Vital Signs:   Temp:  [97.8 F (36.6 C)-98.4 F (36.9 C)] 98.1 F (36.7 C) (11/18 0400) Pulse Rate:  [73-86] 77 (11/18 0700) Resp:  [11-22] 15 (11/18 0700) BP: (91-128)/(50-80) 116/56 mmHg (11/18 0700) SpO2:  [94 %-100 %] 96 % (11/18 0700) Weight:  [251 lb 1.7 oz (113.9 kg)] 251 lb 1.7 oz (113.9 kg) (11/18 0600) Last BM Date: 10/21/13  Weight change: Filed Weights   10/20/13 0700 10/21/13 0427 10/22/13 0600  Weight: 256 lb 6.3 oz (116.3 kg) 257 lb 0.9 oz (116.6 kg) 251 lb 1.7 oz (113.9 kg)    Intake/Output:   Intake/Output Summary (Last 24 hours) at 10/22/13 0705 Last data filed at 10/22/13 0600  Gross per 24 hour  Intake 852.56 ml   Output   4525 ml  Net -3672.44 ml     Physical Exam: General:  Sitting in the recliner. NAD HEENT: normal Neck: supple. JVP 5-6. Carotids 2+ bilat; no bruits. No lymphadenopathy or thryomegaly appreciated. Cor: PMI nondisplaced. Regular rate & rhythm. No rubs, gallops or murmurs.Sternal incision approximated.  Lungs: clear Abdomen: soft, nontender, non-distended. No hepatosplenomegaly. No bruits or masses. Good bowel sounds. Extremities: no cyanosis, clubbing, rash, no edema R and LLE ted hose. RUE triple lumen picc Neuro: alert & orientedx3, cranial nerves grossly intact. moves all 4 extremities w/o difficulty. Affect pleasant  Telemetry: SR 80s  Labs: Basic Metabolic Panel:  Recent Labs Lab 10/17/13 0400 10/17/13 1501  10/18/13 1455 10/19/13 0411 10/19/13 2028 10/20/13 0439 10/21/13 0400 10/22/13 0530  NA 143 144  < > 137 139 136 137 135 136  K 2.9* 3.2*  < > 2.7* 3.2* 2.9* 3.8 4.1 3.8  CL 103 103  < > 93* 93* 89* 90* 91* 89*  CO2 27 29  < > 33* 36*  --  37* 38* 41*  GLUCOSE 112* 113*  < > 141* 97 135* 120* 88 92  BUN 71* 72*  < > 64* 59* 52* 54* 51* 44*  CREATININE 2.14* 2.16*  < > 1.25 1.99* 2.30* 1.94* 1.80* 1.66*  CALCIUM 8.2* 8.4  < >  8.5 9.0  --  9.1 8.7 9.1  PHOS  --  3.6  --   --   --   --   --   --  4.7*  < > = values in this interval not displayed.  Liver Function Tests:  Recent Labs Lab 10/18/13 0424 10/19/13 0411 10/20/13 0439 10/21/13 0400 10/22/13 0530  AST 29 32 34 47* 52*  ALT 38 37 36 44 54*  ALKPHOS 76 77 81 77 80  BILITOT 1.3* 1.2 0.9 1.0 1.1  PROT 5.9* 5.9* 5.8* 5.3* 5.9*  ALBUMIN 2.6* 2.6* 2.6* 2.4* 2.7*   No results found for this basename: LIPASE, AMYLASE,  in the last 168 hours No results found for this basename: AMMONIA,  in the last 168 hours  CBC:  Recent Labs Lab 10/18/13 0424 10/19/13 0411 10/19/13 2028 10/20/13 0439 10/21/13 0400 10/22/13 0530  WBC 11.2* 11.8*  --  11.1* 10.3 8.7  HGB 10.7* 11.7* 11.2* 11.3* 10.3*  10.2*  HCT 32.2* 33.8* 33.0* 33.2* 30.9* 31.4*  MCV 90.4 89.7  --  89.7 90.9 91.3  PLT 496* 513*  --  472* 407* 394    Cardiac Enzymes: No results found for this basename: CKTOTAL, CKMB, CKMBINDEX, TROPONINI,  in the last 168 hours  BNP: BNP (last 3 results)  Recent Labs  10/07/13 0400 10/17/13 0415  PROBNP 3092.0* 30734.0*    Imaging: Dg Chest Port 1 View  10/21/2013   CLINICAL DATA:  Respiratory distress  EXAM: PORTABLE CHEST - 1 VIEW  COMPARISON:  10/17/2013  FINDINGS: There has been further improvement. Less opacity is noted at the bases, with the residual opacity likely atelectasis. No pulmonary edema is evident.  Cardiac silhouette is normal in size. Mediastinum is normal in contour. No pneumothorax.  Right PICC is stable well-positioned.  IMPRESSION: 1. Further improvement in lung aeration. No residual edema. Mild persistent lung base opacity most evident on the left is likely atelectasis.   Electronically Signed   By: Amie Portland M.D.   On: 10/21/2013 07:20     Medications:     Scheduled Medications: . aspirin EC  81 mg Oral Daily  . atorvastatin  80 mg Oral q1800  . bisacodyl  10 mg Oral Daily   Or  . bisacodyl  10 mg Rectal Daily  . budesonide (PULMICORT) nebulizer solution  0.25 mg Nebulization BID  . docusate sodium  200 mg Oral Daily  . enoxaparin (LOVENOX) injection  125 mg Subcutaneous Q12H  . feeding supplement (GLUCERNA SHAKE)  237 mL Oral TID BM  . furosemide  80 mg Oral BID  . insulin aspart  0-15 Units Subcutaneous TID WC  . levalbuterol  1.25 mg Nebulization TID  . midodrine  10 mg Oral BID WC  . pantoprazole  40 mg Oral Q1200  . sodium chloride  10-40 mL Intracatheter Q12H  . spironolactone  25 mg Oral Daily  . warfarin  5 mg Oral q1800  . Warfarin - Physician Dosing Inpatient   Does not apply q1800    Infusions: . sodium chloride 20 mL/hr at 10/21/13 2300    PRN Medications: ALPRAZolam, levalbuterol, metoprolol, ondansetron (ZOFRAN) IV,  oxyCODONE, RESOURCE THICKENUP CLEAR, sodium chloride, traMADol   Assessment:   1) CAD   S/p CABG x 2 10/08/13 2) STEMI 3) A/C systolic HF    EF 30-35% 4) HTN 5) OSA 6) Severe Anxiety 7) Respiratory failure,   -- vent-mask 50% 8) Afib 9) AKI 10) DVT   -- R  peroneal vein   -- on lovenox and coumadin 11) Hypokalemia  Plan/Discussion:     Stable off milrinone. Volume status stable. Renal function continues to trend down. Weight down 6 pounds.  Cut lasix back to 80 mg daily. Continue Spiro 25 mg daily. Add carvedilol 3.125 mg bid. Hold off on Ace.   No further A fib. Off amiodarone.    On lovenox and coumadin. INR 1.4 Pharmacy dosing.   Continue PT/OT   CLEGG,AMY, NP-C 7:05 AM  Patient seen and examined with Tonye Becket, NP. We discussed all aspects of the encounter. I agree with the assessment and plan as stated above.   Doing well. Back to baseline weight. Renal function improved. Agree with decreasing lasix and low-dose carvedilol.   Will cut midodrine back to 5 bid.   Lamar Meter,MD 10:28 AM

## 2013-10-22 NOTE — Progress Notes (Signed)
Patient transferred via wheelchair to 2W23 A&O x 4 and VSS.  Positioned comfortable in bed on tele monitor and left with NT in room.  RN given report.  Tolerated transfer well.  Laverna Peace M   .

## 2013-10-22 NOTE — Progress Notes (Signed)
ANTICOAGULATION CONSULT NOTE - Follow Up Consult  Pharmacy Consult for Lovenox Indication: DVT  Allergies  Allergen Reactions  . Demerol [Meperidine] Other (See Comments)    "makes me crazy"  . Lisinopril Diarrhea  . Shellfish Allergy Rash    Labs:  Recent Labs  10/20/13 0439 10/21/13 0400 10/21/13 0740 10/22/13 0530  HGB 11.3* 10.3*  --  10.2*  HCT 33.2* 30.9*  --  31.4*  PLT 472* 407*  --  394  LABPROT 17.5*  --  16.7* 16.8*  INR 1.48  --  1.39 1.40  CREATININE 1.94* 1.80*  --  1.66*    Estimated Creatinine Clearance: 49.3 ml/min (by C-G formula based on Cr of 1.66).   Assessment: 79yom on Lovenox for acute DVT.  Coumadin per MD, INR = 1.4, CBC stable No bleeding issues noted.  Goal of Therapy:  Anti-Xa level 0.6-1.2 units/ml 4hrs after LMWH dose given Monitor platelets by anticoagulation protocol: Yes   Plan:  1. Continue Lovenox 125mg  (~1 mg/kg) SQ q12h  2. Monitor CBC, renal function and s/sx of bleeding 3. Warfarin 5mg  daily per MD dosing  Thank you.  Sheppard Coil PharmD., BCPS Clinical Pharmacist Pager [phone] 10/22/2013 2:21 PM

## 2013-10-22 NOTE — Progress Notes (Signed)
Pt c/o some SOB; O2 sats RA 97%; O2 2L [place] applied per pt request; will cont. To monitor.

## 2013-10-22 NOTE — Progress Notes (Signed)
14 Days Post-Op Procedure(s) (LRB): CORONARY ARTERY BYPASS GRAFTING (CABG) (N/A) INTRAOPERATIVE TRANSESOPHAGEAL ECHOCARDIOGRAM (N/A) Subjective: Walked in unit Not sleeping well in ICU will transfer to stepdown  Objective: Vital signs in last 24 hours: Temp:  [97.7 F (36.5 C)-98.4 F (36.9 C)] 97.7 F (36.5 C) (11/18 0739) Pulse Rate:  [73-86] 77 (11/18 0700) Cardiac Rhythm:  [-] Normal sinus rhythm (11/18 0000) Resp:  [11-22] 15 (11/18 0700) BP: (91-128)/(50-80) 116/56 mmHg (11/18 0700) SpO2:  [94 %-100 %] 96 % (11/18 0700) Weight:  [251 lb 1.7 oz (113.9 kg)] 251 lb 1.7 oz (113.9 kg) (11/18 0600)  Hemodynamic parameters for last 24 hours: CVP:  [13 mmHg] 13 mmHg  Intake/Output from previous day: 11/17 0701 - 11/18 0700 In: 852.6 [P.O.:540; I.V.:312.6] Out: 4525 [Urine:4525] Intake/Output this shift:    Incisions clean  Lab Results:  Recent Labs  10/21/13 0400 10/22/13 0530  WBC 10.3 8.7  HGB 10.3* 10.2*  HCT 30.9* 31.4*  PLT 407* 394   BMET:  Recent Labs  10/21/13 0400 10/22/13 0530  NA 135 136  K 4.1 3.8  CL 91* 89*  CO2 38* 41*  GLUCOSE 88 92  BUN 51* 44*  CREATININE 1.80* 1.66*  CALCIUM 8.7 9.1    PT/INR:  Recent Labs  10/22/13 0530  LABPROT 16.8*  INR 1.40   ABG    Component Value Date/Time   PHART 7.425 10/14/2013 0441   HCO3 25.1* 10/14/2013 0441   TCO2 34 10/19/2013 2028   ACIDBASEDEF 2.0 10/08/2013 1524   O2SAT 60.2 10/21/2013 0426   CBG (last 3)   Recent Labs  10/21/13 1925 10/22/13 0003 10/22/13 0427  GLUCAP 97 84 90    Assessment/Plan: S/P Procedure(s) (LRB): CORONARY ARTERY BYPASS GRAFTING (CABG) (N/A) INTRAOPERATIVE TRANSESOPHAGEAL ECHOCARDIOGRAM (N/A) Plan for transfer to step-down: see transfer orders   LOS: 17 days    VAN TRIGT III,PETER 10/22/2013

## 2013-10-22 NOTE — Clinical Documentation Improvement (Signed)
THIS DOCUMENT IS NOT A PERMANENT PART OF THE MEDICAL RECORD  Please update your documentation with the medical record to reflect your response to this query. If you need help knowing how to do this please call [phone].  10/22/13   Dear Dr.Van Maudie Flakes / Associates,  In a better effort to capture your patient's severity of illness, reflect appropriate length of stay and utilization of resources, a review of the patient medical record has revealed the following indicators.    Based on your clinical judgment, please clarify and document in a progress note and/or discharge summary the clinical condition associated with the following supporting information:  In responding to this query please exercise your independent judgment.  The fact that a query is asked, does not imply that any particular answer is desired or expected.  Possible Clinical Conditions?  _______Mild Malnutrition  _______Moderate Malnutrition _______Severe Malnutrition   _______Protein Calorie Malnutrition _______Severe Protein Calorie Malnutrition _______Other Condition _______Cannot clinically determine     Risk Factors:  Signs & Symptoms: Ht: 6'1"     Wt: 277 lb 12.5 oz   Treatment: Ensure pudding 3 times daily; RD to follow for nutrition plan.   Nutrition Consult: 10/16/13: Inadequate oral intake now related to poor appetite as evidenced by family report, ongoing.   You may use possible, probable, or suspect with inpatient documentation. possible, probable, suspected diagnoses MUST be documented at the time of discharge  Reviewed: No additional documentation provided.        Thank You,  Marciano Sequin, Clinical Documentation Specialist: [phone] Health Information Management [place]

## 2013-10-22 NOTE — Progress Notes (Signed)
Pt refuses to wear CPAP. Pt does not tolerate well. Pt encouraged to call RT if pt changes mind. No distress noted.

## 2013-10-22 NOTE — Progress Notes (Signed)
CSW received phone call from Case Manager stating that if patient is unable to go to CIR, patient will come home with family. CSW signing off at this time. Please re consult if social work needs arise.  Maree Krabbe, MSW, [facility] [phone]

## 2013-10-22 NOTE — Progress Notes (Signed)
Subjective: Interval History: has complaints bloated.  Objective: Vital signs in last 24 hours: Temp:  [97.7 F (36.5 C)-98.4 F (36.9 C)] 97.7 F (36.5 C) (11/18 0739) Pulse Rate:  [73-86] 77 (11/18 0700) Resp:  [11-22] 15 (11/18 0700) BP: (91-128)/(50-80) 116/56 mmHg (11/18 0700) SpO2:  [94 %-100 %] 96 % (11/18 0700) Weight:  [113.9 kg (251 lb 1.7 oz)] 113.9 kg (251 lb 1.7 oz) (11/18 0600) Weight change: -2.7 kg (-5 lb 15.2 oz)  Intake/Output from previous day: 11/17 0701 - 11/18 0700 In: 852.6 [P.O.:540; I.V.:312.6] Out: 4525 [Urine:4525] Intake/Output this shift:    General appearance: alert, cooperative, no distress and pale Resp: diminished breath sounds bilaterally and rales bibasilar Cardio: regular rate and rhythm and systolic murmur: holosystolic 2/6, blowing at apex GI: mod distension,pos bs, liver down 5 cm Extremities: edema 2+  Lab Results:  Recent Labs  10/21/13 0400 10/22/13 0530  WBC 10.3 8.7  HGB 10.3* 10.2*  HCT 30.9* 31.4*  PLT 407* 394   BMET:  Recent Labs  10/21/13 0400 10/22/13 0530  NA 135 136  K 4.1 3.8  CL 91* 89*  CO2 38* 41*  GLUCOSE 88 92  BUN 51* 44*  CREATININE 1.80* 1.66*  CALCIUM 8.7 9.1   No results found for this basename: PTH,  in the last 72 hours Iron Studies:  Recent Labs  10/21/13 0830  IRON 38*  TIBC 185*    Studies/Results: Dg Chest Port 1 View  10/21/2013   CLINICAL DATA:  Respiratory distress  EXAM: PORTABLE CHEST - 1 VIEW  COMPARISON:  10/17/2013  FINDINGS: There has been further improvement. Less opacity is noted at the bases, with the residual opacity likely atelectasis. No pulmonary edema is evident.  Cardiac silhouette is normal in size. Mediastinum is normal in contour. No pneumothorax.  Right PICC is stable well-positioned.  IMPRESSION: 1. Further improvement in lung aeration. No residual edema. Mild persistent lung base opacity most evident on the left is likely atelectasis.   Electronically Signed    By: Amie Portland M.D.   On: 10/21/2013 07:20    I have reviewed the patient's current medications.  Assessment/Plan: 1  AKI resolving still vol xs but better. Diuresing but developing alkalemia,so will lower dose and give KCl.   2 CAD s/p CABG  Per [facility] and Cards 3 Obesity 4 AAA P  Lower Lasix, KCl, will s/o at this time.  If Cr not <1.3, will need long term f/u    LOS: 17 days   Mitchael Luckey L 10/22/2013,8:00 AM

## 2013-10-22 NOTE — Progress Notes (Signed)
Physical Therapy Treatment Patient Details Name: Antonio Bernard MRN: [id number] DOB: 1933/03/31 Today's Date: 10/22/2013 Time: 7829-5621 PT Time Calculation (min): 23 min  PT Assessment / Plan / Recommendation  History of Present Illness Adm 11/01 with MI; 11/05 CABG x 2 and remained on vent until 11/09   PT Comments   Pt continues to make good progress.  Follow Up Recommendations  CIR     Does the patient have the potential to tolerate intense rehabilitation     Barriers to Discharge        Equipment Recommendations  None recommended by PT    Recommendations for Other Services    Frequency     Progress towards PT Goals Progress towards PT goals: Progressing toward goals  Plan Current plan remains appropriate    Precautions / Restrictions Precautions Precautions: Sternal;Fall Precaution Comments: Syncope with PT.   Pertinent Vitals/Pain VSS    Mobility  Bed Mobility Supine to Sit: 4: Min assist;HOB elevated Sitting - Scoot to Edge of Bed: 4: Min guard Sit to Sidelying Right: 4: Min assist;HOB flat Details for Bed Mobility Assistance: Assist to bring trunk up and cues to maintain sternal precautions.  Assist to bring feet back up into bed. Transfers Sit to Stand: 4: Min assist;3: Mod assist;Without upper extremity assist;From bed;From chair/3-in-1;From toilet Stand to Sit: 4: Min assist;Without upper extremity assist;To chair/3-in-1;To toilet Details for Transfer Assistance: Verbal/tactile cues to put hands on knees for sit to stand to adhere to sternal precautions. Min A for chair and bed and mod A to stand from low commode. Ambulation/Gait Ambulation/Gait Assistance: 4: Min assist (+1 following with chair) Ambulation Distance (Feet): 150 Feet Assistive device: Rolling walker Ambulation/Gait Assistance Details: verbal cues to look up. Gait Pattern: Step-through pattern;Decreased stride length;Wide base of support Gait velocity: decr    Exercises     PT  Diagnosis:    PT Problem List:   PT Treatment Interventions:     PT Goals (current goals can now be found in the care plan section)    Visit Information  Last PT Received On: 10/22/13 Assistance Needed: +2 (to follow with chair due to syncope) Reason Eval/Treat Not Completed: Other (comment) (Pt walked earlier with nursing.) History of Present Illness: Adm 11/01 with MI; 11/05 CABG x 2 and remained on vent until 11/09    Subjective Data      Cognition  Cognition Arousal/Alertness: Awake/alert Behavior During Therapy: WFL for tasks assessed/performed Overall Cognitive Status: Within Functional Limits for tasks assessed    Balance  Static Standing Balance Static Standing - Balance Support: Bilateral upper extremity supported Static Standing - Level of Assistance: 4: Min assist  End of Session PT - End of Session Equipment Utilized During Treatment: Gait belt Activity Tolerance: Patient tolerated treatment well Patient left: in bed;with call bell/phone within reach;with family/visitor present Nurse Communication: Mobility status   GP     Antonio Bernard 10/22/2013, 3:09 PM  [employer] PT [phone]

## 2013-10-23 ENCOUNTER — Encounter (HOSPITAL_COMMUNITY): Payer: Self-pay | Admitting: Physical Medicine and Rehabilitation

## 2013-10-23 ENCOUNTER — Inpatient Hospital Stay (HOSPITAL_COMMUNITY): Payer: Medicare Other

## 2013-10-23 DIAGNOSIS — I5023 Acute on chronic systolic (congestive) heart failure: Secondary | ICD-10-CM

## 2013-10-23 DIAGNOSIS — I2510 Atherosclerotic heart disease of native coronary artery without angina pectoris: Secondary | ICD-10-CM

## 2013-10-23 DIAGNOSIS — I509 Heart failure, unspecified: Secondary | ICD-10-CM

## 2013-10-23 DIAGNOSIS — R5381 Other malaise: Secondary | ICD-10-CM

## 2013-10-23 LAB — BASIC METABOLIC PANEL
BUN: 40 mg/dL — ABNORMAL HIGH (ref 6–23)
CO2: 39 mEq/L — ABNORMAL HIGH (ref 19–32)
Calcium: 8.9 mg/dL (ref 8.4–10.5)
Chloride: 89 mEq/L — ABNORMAL LOW (ref 96–112)
Creatinine, Ser: 1.67 mg/dL — ABNORMAL HIGH (ref 0.50–1.35)
GFR calc Af Amer: 44 mL/min — ABNORMAL LOW (ref >90)
GFR calc non Af Amer: 38 mL/min — ABNORMAL LOW (ref >90)
Glucose, Bld: 95 mg/dL (ref 70–99)
Potassium: 3.5 mEq/L (ref 3.5–5.1)
Sodium: 134 mEq/L — ABNORMAL LOW (ref 135–145)

## 2013-10-23 LAB — CBC
HCT: 30 % — ABNORMAL LOW (ref 39.0–52.0)
Hemoglobin: 9.7 g/dL — ABNORMAL LOW (ref 13.0–17.0)
MCH: 29.4 pg (ref 26.0–34.0)
MCHC: 32.3 g/dL (ref 30.0–36.0)
MCV: 90.9 fL (ref 78.0–100.0)
Platelets: 384 10*3/uL (ref 150–400)
RBC: 3.3 MIL/uL — ABNORMAL LOW (ref 4.22–5.81)
RDW: 13.6 % (ref 11.5–15.5)
WBC: 9.4 10*3/uL (ref 4.0–10.5)

## 2013-10-23 LAB — GLUCOSE, CAPILLARY
Glucose-Capillary: 83 mg/dL (ref 70–99)
Glucose-Capillary: 112 mg/dL — ABNORMAL HIGH (ref 70–99)

## 2013-10-23 LAB — PROTIME-INR
INR: 1.43 (ref 0.00–1.49)
Prothrombin Time: 17.1 seconds — ABNORMAL HIGH (ref 11.6–15.2)

## 2013-10-23 MED ORDER — POTASSIUM CHLORIDE CRYS ER 20 MEQ PO TBCR
20.0000 meq | EXTENDED_RELEASE_TABLET | Freq: Two times a day (BID) | ORAL | Status: DC
  Administered 2013-10-23 – 2013-10-24 (×3): 20 meq via ORAL
  Filled 2013-10-23 (×6): qty 1

## 2013-10-23 MED ORDER — FUROSEMIDE 40 MG PO TABS
40.0000 mg | ORAL_TABLET | Freq: Every day | ORAL | Status: DC
  Administered 2013-10-23 – 2013-10-24 (×2): 40 mg via ORAL
  Filled 2013-10-23 (×3): qty 1

## 2013-10-23 MED ORDER — POTASSIUM CHLORIDE CRYS ER 20 MEQ PO TBCR
20.0000 meq | EXTENDED_RELEASE_TABLET | Freq: Once | ORAL | Status: AC
  Administered 2013-10-23: 20 meq via ORAL
  Filled 2013-10-23: qty 1

## 2013-10-23 NOTE — Consult Note (Signed)
Physical Medicine and Rehabilitation Consult Reason for Consult: Deconditioning. Referring Physician: Dr. Morton Peters    HPI: Antonio Bernard is a 79 y.o. male with history of HTN, obesity, OSA, anxiety disorder; who was admitted on 10/05/13 with STEMI. he  underwent urgent cardiac catheterization by Dr. Herbie Baltimore that demonstrated a occlusion of the LAD which was opened with PTCA and IA:PB placed to help flow through LAD. He was allowed to recover from MI and underwent CABG X 1 on 10/08/13 and extubated on 10/13/13.  Has required pressors and transient A fib treated with amiodarone. He had decrease in UOP with  fluid overload due to decompensated CHF,  hypoxia and AKI. Dr. Lorie Phenix consulted for input and recommended increasing diuresis as well as weaning off of dopamine. LE dopplers done due to hypoxia and right peroneal vein DVT noted and patient started on heparin for treatment.  Renal failure slowly improving and urine output improved with improvement in respiratory status. He has had syncope due to orthostasis and started on midodrine for BP support. He noted to be deconditioned and MD, PT recommending CIR.      Review of Systems  Constitutional: Positive for malaise/fatigue.  Psychiatric/Behavioral: The patient is nervous/anxious.     Past Medical History  Diagnosis Date  . Acid reflux   . Family history of anesthesia complication     daughter had problem waking up  . Pneumonia     hx of  . Sleep apnea     no treatment for, last sleep study over 5 years ago  . Abdominal aortic aneurysm     found 06/29/12 "small"  . Headache(784.0)   . Herniated vertebral disc     thoracic area  . Cataracts, bilateral     hx of  . H/O Legionnaire's disease     about 24 years ago  . Anxiety     panic attacks   . Claustrophobia     "severe"  . Hypertension     sees  Dr. Ronne Binning [phone]  . Obesity (BMI 35.0-39.9 without comorbidity) 10/06/2013  . Essential hypertension 10/06/2013  .  Anginal pain     Past Surgical History  Procedure Laterality Date  . Shoulder surgery      left  . Cataract extraction w/ intraocular lens implant      bilateral  . Lumbar laminectomy/decompression microdiscectomy  07/20/2012    Procedure: LUMBAR LAMINECTOMY/DECOMPRESSION MICRODISCECTOMY 1 LEVEL;  Surgeon: Reinaldo Meeker, MD;  Location: [facility];  Service: Neurosurgery;  Laterality: N/A;  lumbar four-five left  . Back surgery    . Cardiac catheterization  10/05/2013  . Eye surgery    . Coronary artery bypass graft N/A 10/08/2013    Procedure: CORONARY ARTERY BYPASS GRAFTING (CABG);  Surgeon: Kerin Perna, MD;  Location: [facility] OR;  Service: Open Heart Surgery;  Laterality: N/A;  Times 2 using left internal mammary artery and endoscopically harvested left saphenous vein  . Intraoperative transesophageal echocardiogram N/A 10/08/2013    Procedure: INTRAOPERATIVE TRANSESOPHAGEAL ECHOCARDIOGRAM;  Surgeon: Kerin Perna, MD;  Location: [facility] OR;  Service: Open Heart Surgery;  Laterality: N/A;   History reviewed. No pertinent family history.  Social History:  Lives alone and independent PTA. Per  reports that he has quit smoking. His smoking use included Cigarettes and Cigars. He smoked 0.50 packs per day. He has never used smokeless tobacco. He reports that he does not drink alcohol or use illicit drugs.   Allergies  Allergen Reactions  .  Demerol [Meperidine] Other (See Comments)    "makes me crazy"  . Lisinopril Diarrhea  . Shellfish Allergy Rash   Medications Prior to Admission  Medication Sig Dispense Refill  . ALPRAZolam (XANAX) 0.25 MG tablet Take 0.25 mg by mouth 3 (three) times daily as needed. For anxiety      . amLODipine (NORVASC) 10 MG tablet Take 10 mg by mouth daily.      . Ascorbic Acid (VITAMIN C) 1000 MG tablet Take 1,000 mg by mouth daily.      Marland Kitchen aspirin EC 81 MG tablet Take 81 mg by mouth daily.      . cholecalciferol (VITAMIN D) 1000 UNITS tablet Take 2,000 Units by  mouth daily.      Marland Kitchen esomeprazole (NEXIUM) 40 MG capsule Take 40 mg by mouth daily before breakfast.      . fluticasone (FLONASE) 50 MCG/ACT nasal spray Place 2 sprays into the nose daily as needed. For allergies      . irbesartan-hydrochlorothiazide (AVALIDE) 300-12.5 MG per tablet Take 1 tablet by mouth daily.      . nebivolol (BYSTOLIC) 10 MG tablet Take 10 mg by mouth daily.        Home: Home Living Family/patient expects to be discharged to:: Private residence Living Arrangements: Alone (widowed) Available Help at Discharge: Family Type of Home: House Home Access: Stairs to enter Secretary/administrator of Steps: 1+1 Entrance Stairs-Rails: None Home Layout: Laundry or work area in basement;Able to live on main level with bedroom/bathroom Home Equipment: Environmental consultant - 2 wheels Additional Comments: daughter unsure if pt acquired other equipment s/p back surgery in 2013. Pt has 5 local children who managed to provide 24/7 care x 6 weeks at time of back surgery.   Functional History: Prior Function Comments: playing golf PTA Functional Status:  Mobility: Bed Mobility Bed Mobility: Sit to Sidelying Right Rolling Right: 3: Mod assist Right Sidelying to Sit: 1: +2 Total assist;HOB elevated Right Sidelying to Sit: Patient Percentage: 50% Supine to Sit: 4: Min assist;HOB elevated Supine to Sit: Patient Percentage: 40% Sitting - Scoot to Edge of Bed: 4: Min guard Sit to Sidelying Right: 4: Min assist;HOB flat Transfers Transfers: Sit to Stand;Stand to Sit Sit to Stand: 4: Min assist;3: Mod assist;Without upper extremity assist;From bed;From chair/3-in-1;From toilet Sit to Stand: Patient Percentage: 80% Stand to Sit: 4: Min assist;Without upper extremity assist;To chair/3-in-1;To toilet Stand to Sit: Patient Percentage: 80% Transfer via Lift Equipment: [place] (floor to bed transfer with nursing assist) Ambulation/Gait Ambulation/Gait Assistance: 4: Min assist (+1 following with  chair) Ambulation/Gait: Patient Percentage: 90% Ambulation Distance (Feet): 150 Feet Assistive device: Rolling walker Ambulation/Gait Assistance Details: verbal cues to look up. Gait Pattern: Step-through pattern;Decreased stride length;Wide base of support Gait velocity: decr    ADL:    Cognition: Cognition Overall Cognitive Status: Within Functional Limits for tasks assessed Orientation Level: Oriented X4 Cognition Arousal/Alertness: Awake/alert Behavior During Therapy: WFL for tasks assessed/performed Overall Cognitive Status: Within Functional Limits for tasks assessed  Blood pressure 114/67, pulse 79, temperature 97.5 F (36.4 C), temperature source Oral, resp. rate 16, height 6\' 1"  (1.854 m), weight 113.4 kg (250 lb), SpO2 98.00%. Physical Exam  Nursing note and vitals reviewed. Constitutional: He appears well-developed and well-nourished.  HENT:  Head: Normocephalic.  Eyes: Conjunctivae are normal. Pupils are equal, round, and reactive to light.  Cardiovascular: Normal rate.   Respiratory: Effort normal.  GI: Soft.  Musculoskeletal:  Chest wall, legs slightly tender  Neurological:  Reasonable insight and  awareness. Strength 3+ shoulder girdle to 4/5 distally. HF's 3-, KE 3+, ankles 4/5. No gross sensory deficits  Skin:  Chest incision, legs clean and intact  Psychiatric: He has a normal mood and affect. His behavior is normal. Judgment and thought content normal.    Results for orders placed during the hospital encounter of 10/05/13 (from the past 24 hour(s))  GLUCOSE, CAPILLARY     Status: None   Collection Time    10/22/13 11:41 AM      Result Value Range   Glucose-Capillary 93  70 - 99 mg/dL   Comment 1 Documented in Chart     Comment 2 Notify RN    CARBOXYHEMOGLOBIN     Status: Abnormal   Collection Time    10/22/13  4:19 PM      Result Value Range   Total hemoglobin 10.0 (*) 13.5 - 18.0 g/dL   O2 Saturation 91.4     Carboxyhemoglobin 2.0 (*) 0.5 -  1.5 %   Methemoglobin 0.9  0.0 - 1.5 %  GLUCOSE, CAPILLARY     Status: Abnormal   Collection Time    10/22/13  4:53 PM      Result Value Range   Glucose-Capillary 106 (*) 70 - 99 mg/dL   Comment 1 Documented in Chart     Comment 2 Notify RN    GLUCOSE, CAPILLARY     Status: Abnormal   Collection Time    10/22/13  9:16 PM      Result Value Range   Glucose-Capillary 101 (*) 70 - 99 mg/dL   Comment 1 Documented in Chart     Comment 2 Notify RN    CBC     Status: Abnormal   Collection Time    10/23/13  6:35 AM      Result Value Range   WBC 9.4  4.0 - 10.5 K/uL   RBC 3.30 (*) 4.22 - 5.81 MIL/uL   Hemoglobin 9.7 (*) 13.0 - 17.0 g/dL   HCT 78.2 (*) 95.6 - 21.3 %   MCV 90.9  78.0 - 100.0 fL   MCH 29.4  26.0 - 34.0 pg   MCHC 32.3  30.0 - 36.0 g/dL   RDW 08.6  57.8 - 46.9 %   Platelets 384  150 - 400 K/uL  PROTIME-INR     Status: Abnormal   Collection Time    10/23/13  6:35 AM      Result Value Range   Prothrombin Time 17.1 (*) 11.6 - 15.2 seconds   INR 1.43  0.00 - 1.49  BASIC METABOLIC PANEL     Status: Abnormal   Collection Time    10/23/13  6:35 AM      Result Value Range   Sodium 134 (*) 135 - 145 mEq/L   Potassium 3.5  3.5 - 5.1 mEq/L   Chloride 89 (*) 96 - 112 mEq/L   CO2 39 (*) 19 - 32 mEq/L   Glucose, Bld 95  70 - 99 mg/dL   BUN 40 (*) 6 - 23 mg/dL   Creatinine, Ser 6.29 (*) 0.50 - 1.35 mg/dL   Calcium 8.9  8.4 - 52.8 mg/dL   GFR calc non Af Amer 38 (*) >90 mL/min   GFR calc Af Amer 44 (*) >90 mL/min   Dg Chest 2 View  10/23/2013   CLINICAL DATA:  CABG.  Shortness of breath.  EXAM: CHEST  2 VIEW  COMPARISON:  10/21/2013.  FINDINGS: Right PICC line in stable position.  CABG. Cardiomegaly. Normal pulmonary vascularity. Small stable left pleural effusion. No pneumothorax. No focal pulmonary infiltrate. No acute bony abnormality.  IMPRESSION: 1. CABG. Stable cardiomegaly. No pulmonary edema or pulmonary venous congestion. 2. Stable small left pleural effusion. 3. Stable  PICC line position.   Electronically Signed   By: Maisie Fus  Register   On: 10/23/2013 07:05    Assessment/Plan: Diagnosis: deconditioning after recent CABG, with post-op AFib and CHF 1. Does the need for close, 24 hr/day medical supervision in concert with the patient's rehab needs make it unreasonable for this patient to be served in a less intensive setting? Yes 2. Co-Morbidities requiring supervision/potential complications: htn, see above 3. Due to bladder management, bowel management, safety, skin/wound care, disease management, medication administration, pain management and patient education, does the patient require 24 hr/day rehab nursing? Yes 4. Does the patient require coordinated care of a physician, rehab nurse, PT (1-2 hrs/day, 5 days/week) and OT (1-2 hrs/day, 5 days/week) to address physical and functional deficits in the context of the above medical diagnosis(es)? Yes Addressing deficits in the following areas: balance, endurance, locomotion, strength, transferring, bowel/bladder control, bathing, dressing, feeding, grooming, toileting and psychosocial support 5. Can the patient actively participate in an intensive therapy program of at least 3 hrs of therapy per day at least 5 days per week? Yes 6. The potential for patient to make measurable gains while on inpatient rehab is excellent 7. Anticipated functional outcomes upon discharge from inpatient rehab are mod I with PT, mod I with OT, n/a with SLP. 8. Estimated rehab length of stay to reach the above functional goals is: 7-9 days 9. Does the patient have adequate social supports to accommodate these discharge functional goals? Yes 10. Anticipated D/C setting: Home 11. Anticipated post D/C treatments: HH therapy 12. Overall Rehab/Functional Prognosis: excellent  RECOMMENDATIONS: This patient's condition is appropriate for continued rehabilitative care in the following setting: CIR Patient has agreed to participate in  recommended program. Yes Note that insurance prior authorization may be required for reimbursement for recommended care.  Comment: Rehab RN to follow up.   Ranelle Oyster, MD, [place] [facility]     10/23/2013

## 2013-10-23 NOTE — Progress Notes (Signed)
Pt refused CPAP for tonight. Pt was made aware if he changes his mind to contact RT. RT will continue to monitor as needed.

## 2013-10-23 NOTE — Progress Notes (Addendum)
[address]       [place] [address]             [phone]      15 Days Post-Op  Procedure(s) (LRB): CORONARY ARTERY BYPASS GRAFTING (CABG) (N/A) INTRAOPERATIVE TRANSESOPHAGEAL ECHOCARDIOGRAM (N/A) Subjective: Slowly feeling stronger  Objective  Telemetry sinus rhythm  Temp:  [97.5 F (36.4 C)-98.3 F (36.8 C)] 97.5 F (36.4 C) (11/19 0332) Pulse Rate:  [73-89] 79 (11/19 0332) Resp:  [9-20] 16 (11/19 0332) BP: (105-128)/(52-73) 114/67 mmHg (11/19 0332) SpO2:  [94 %-100 %] 98 % (11/19 0742) Weight:  [250 lb (113.4 kg)] 250 lb (113.4 kg) (11/19 0332)   Intake/Output Summary (Last 24 hours) at 10/23/13 0855 Last data filed at 10/23/13 0700  Gross per 24 hour  Intake   1170 ml  Output    625 ml  Net    545 ml       General appearance: alert, cooperative and no distress Heart: regular rate and rhythm Lungs: mildly dim in bases Abdomen: benign Extremities: edema minor Wound: incisions healing well  Lab Results:  Recent Labs  10/22/13 0530 10/23/13 0635  NA 136 134*  K 3.8 3.5  CL 89* 89*  CO2 41* 39*  GLUCOSE 92 95  BUN 44* 40*  CREATININE 1.66* 1.67*  CALCIUM 9.1 8.9  PHOS 4.7*  --     Recent Labs  10/21/13 0400 10/22/13 0530  AST 47* 52*  ALT 44 54*  ALKPHOS 77 80  BILITOT 1.0 1.1  PROT 5.3* 5.9*  ALBUMIN 2.4* 2.7*   No results found for this basename: LIPASE, AMYLASE,  in the last 72 hours  Recent Labs  10/22/13 0530 10/23/13 0635  WBC 8.7 9.4  HGB 10.2* 9.7*  HCT 31.4* 30.0*  MCV 91.3 90.9  PLT 394 384   No results found for this basename: CKTOTAL, CKMB, TROPONINI,  in the last 72 hours No components found with this basename: POCBNP,  No results found for this basename: DDIMER,  in the last 72 hours No results found for this basename: HGBA1C,  in the last 72 hours No results found for this basename: CHOL, HDL, LDLCALC, TRIG, CHOLHDL,  in the last 72 hours No results found for this basename: TSH,  T4TOTAL, FREET3, T3FREE, THYROIDAB,  in the last 72 hours  Recent Labs  10/21/13 0830  TIBC 185*  IRON 38*    Medications: Scheduled . aspirin EC  81 mg Oral Daily  . atorvastatin  80 mg Oral q1800  . bisacodyl  10 mg Oral Daily   Or  . bisacodyl  10 mg Rectal Daily  . budesonide (PULMICORT) nebulizer solution  0.25 mg Nebulization BID  . carvedilol  3.125 mg Oral BID WC  . docusate sodium  200 mg Oral Daily  . enoxaparin (LOVENOX) injection  125 mg Subcutaneous Q12H  . feeding supplement (GLUCERNA SHAKE)  237 mL Oral TID BM  . furosemide  80 mg Oral Daily  . insulin aspart  0-15 Units Subcutaneous TID WC  . levalbuterol  1.25 mg Nebulization TID  . pantoprazole  40 mg Oral Q1200  . sodium chloride  10-40 mL Intracatheter Q12H  . spironolactone  25 mg Oral Daily  . warfarin  5 mg Oral q1800  . Warfarin - Physician Dosing Inpatient   Does not apply q1800     Radiology/Studies:  Dg Chest 2 View  10/23/2013   CLINICAL DATA:  CABG.  Shortness of breath.  EXAM: CHEST  2 VIEW  COMPARISON:  10/21/2013.  FINDINGS: Right PICC line in stable position. CABG. Cardiomegaly. Normal pulmonary vascularity. Small stable left pleural effusion. No pneumothorax. No focal pulmonary infiltrate. No acute bony abnormality.  IMPRESSION: 1. CABG. Stable cardiomegaly. No pulmonary edema or pulmonary venous congestion. 2. Stable small left pleural effusion. 3. Stable PICC line position.   Electronically Signed   By: Maisie Fus  Register   On: 10/23/2013 07:05    INR: 1.43 Will add last result for INR, ABG once components are confirmed Will add last 4 CBG results once components are confirmed  Assessment/Plan: S/P Procedure(s) (LRB): CORONARY ARTERY BYPASS GRAFTING (CABG) (N/A) INTRAOPERATIVE TRANSESOPHAGEAL ECHOCARDIOGRAM (N/A)  1 slow, steady progress 2 cardiac meds as per heart failure re: diuretics/coreg. Rhythm stable. Will give a little K+ for 3.5 3 inr slowly rising- cont coumadin 5 mg 4 cont  nebs/pulm toilet 5 cont rehab- ? Inpatient CIR stay, will order rehab eval 6 sugars controlled     LOS: 18 days    GOLD,WAYNE E 11/19/20148:55 AM Appears to be good candidate for inpatient rehabilitation. patient examined and medical record reviewed,agree with above note. VAN TRIGT III,Jerrin Recore 10/23/2013

## 2013-10-23 NOTE — Progress Notes (Signed)
CARDIAC REHAB PHASE I   PRE:  Rate/Rhythm: 83 SR  BP:  Supine: 112/65  Sitting:   Standing:    SaO2: 94 RA  MODE:  Ambulation: 300 ft   POST:  Rate/Rhythm: 93 SR  BP:  Supine:   Sitting: 117/64  Standing:    SaO2: 96 RA 1135-1210 On arrival pt in bed, sat him on side of bed to use urinal. He had to stand then felt need to have BM. Took pt to the bathroom, then sat him back on bed for rest prior to attempting to walk. Assisted X 2 used walker and gait belt to ambulate. Also pushed w/c behind him. Pt was able to walk 300 feet with one sitting rest stops. He c/o of SOB in hall, O2 sat would not register in hall. Pt to recliner after walk with call light in reach and daughter present.    Melina Copa RN 10/23/2013 12:11 PM

## 2013-10-23 NOTE — Progress Notes (Signed)
Advanced Heart Failure Rounding Note   Subjective:     Mr. Mages is a 79 yo male with a hx of HTN, severe anxiety, and OSA who presented to the ED 10/05/13 with a STEMI and was taken to the cath lab. Cath revealed proximally occluded LAD - after flow was restored it was apparently there was extensive LAD disease as well as severe 90-95% ostial D1 disease . There was moderate to severe focal disease of the sub-branches as well. Balloon angioplasty was performed, but no stent was placed. TIMI 2 flow was noted in the LAD and an IABP was placed. He was then evaluated by TCTS and was taken for a CABG x2 LIMA-LAD; SVG-DIAG on 10/08/13. After CABG he was brought back to the ICU still on IABP and multiple pressors. The balloon pump was weaned off 10/10/13 and he was extubated 10/13/13. Patient developed transient afib on 11/11 and was started on Amiodarone gtt.   11/11/4 Venous doppler acute deep DVT involving R peroneal vein 10/15/13 EF 30-35%. RV ok.   POD # 18 Off dopamine and Milrinone. Yesterday lasix cut back to 80 mg daily and he was started on 3.125 carvedilol twice a day. Weight down another pound. Refuses CPAP.   Denies SOB/Orthopnea.   Cr 2.1->1.6->2.1 ->1.99 -> 1.94 -> 1.80> 1.6>1.6  Objective:   Weight Range:  Vital Signs:   Temp:  [97.5 F (36.4 C)-98.3 F (36.8 C)] 97.5 F (36.4 C) (11/19 0332) Pulse Rate:  [73-89] 79 (11/19 0332) Resp:  [9-20] 16 (11/19 0332) BP: (105-128)/(52-73) 114/67 mmHg (11/19 0332) SpO2:  [94 %-100 %] 98 % (11/19 0742) Weight:  [250 lb (113.4 kg)] 250 lb (113.4 kg) (11/19 0332) Last BM Date: 10/22/13  Weight change: Filed Weights   10/21/13 0427 10/22/13 0600 10/23/13 0332  Weight: 257 lb 0.9 oz (116.6 kg) 251 lb 1.7 oz (113.9 kg) 250 lb (113.4 kg)    Intake/Output:   Intake/Output Summary (Last 24 hours) at 10/23/13 0833 Last data filed at 10/23/13 0518  Gross per 24 hour  Intake    930 ml  Output    625 ml  Net    305 ml     Physical  Exam: General:   NAD Lying in bed HEENT: normal Neck: supple. JVP 5-6. Carotids 2+ bilat; no bruits. No lymphadenopathy or thryomegaly appreciated. Cor: PMI nondisplaced. Regular rate & rhythm. No rubs, gallops or murmurs.Sternal incision approximated.  Lungs: clear Abdomen: soft, nontender, non-distended. No hepatosplenomegaly. No bruits or masses. Good bowel sounds. Extremities: no cyanosis, clubbing, rash, RLE 1+ LLE no edema. Bilateral ted hose. RUE triple lumen picc Neuro: alert & orientedx3, cranial nerves grossly intact. moves all 4 extremities w/o difficulty. Affect pleasant  Telemetry: SR 70s  Labs: Basic Metabolic Panel:  Recent Labs Lab 10/17/13 0400 10/17/13 1501  10/19/13 0411 10/19/13 2028 10/20/13 0439 10/21/13 0400 10/22/13 0530 10/23/13 0635  NA 143 144  < > 139 136 137 135 136 134*  K 2.9* 3.2*  < > 3.2* 2.9* 3.8 4.1 3.8 3.5  CL 103 103  < > 93* 89* 90* 91* 89* 89*  CO2 27 29  < > 36*  --  37* 38* 41* 39*  GLUCOSE 112* 113*  < > 97 135* 120* 88 92 95  BUN 71* 72*  < > 59* 52* 54* 51* 44* 40*  CREATININE 2.14* 2.16*  < > 1.99* 2.30* 1.94* 1.80* 1.66* 1.67*  CALCIUM 8.2* 8.4  < > 9.0  --  9.1  8.7 9.1 8.9  PHOS  --  3.6  --   --   --   --   --  4.7*  --   < > = values in this interval not displayed.  Liver Function Tests:  Recent Labs Lab 10/18/13 0424 10/19/13 0411 10/20/13 0439 10/21/13 0400 10/22/13 0530  AST 29 32 34 47* 52*  ALT 38 37 36 44 54*  ALKPHOS 76 77 81 77 80  BILITOT 1.3* 1.2 0.9 1.0 1.1  PROT 5.9* 5.9* 5.8* 5.3* 5.9*  ALBUMIN 2.6* 2.6* 2.6* 2.4* 2.7*   No results found for this basename: LIPASE, AMYLASE,  in the last 168 hours No results found for this basename: AMMONIA,  in the last 168 hours  CBC:  Recent Labs Lab 10/19/13 0411 10/19/13 2028 10/20/13 0439 10/21/13 0400 10/22/13 0530 10/23/13 0635  WBC 11.8*  --  11.1* 10.3 8.7 9.4  HGB 11.7* 11.2* 11.3* 10.3* 10.2* 9.7*  HCT 33.8* 33.0* 33.2* 30.9* 31.4* 30.0*  MCV  89.7  --  89.7 90.9 91.3 90.9  PLT 513*  --  472* 407* 394 384    Cardiac Enzymes: No results found for this basename: CKTOTAL, CKMB, CKMBINDEX, TROPONINI,  in the last 168 hours  BNP: BNP (last 3 results)  Recent Labs  10/07/13 0400 10/17/13 0415  PROBNP 3092.0* 30734.0*    Imaging: Dg Chest 2 View  10/23/2013   CLINICAL DATA:  CABG.  Shortness of breath.  EXAM: CHEST  2 VIEW  COMPARISON:  10/21/2013.  FINDINGS: Right PICC line in stable position. CABG. Cardiomegaly. Normal pulmonary vascularity. Small stable left pleural effusion. No pneumothorax. No focal pulmonary infiltrate. No acute bony abnormality.  IMPRESSION: 1. CABG. Stable cardiomegaly. No pulmonary edema or pulmonary venous congestion. 2. Stable small left pleural effusion. 3. Stable PICC line position.   Electronically Signed   By: Maisie Fus  Register   On: 10/23/2013 07:05     Medications:     Scheduled Medications: . aspirin EC  81 mg Oral Daily  . atorvastatin  80 mg Oral q1800  . bisacodyl  10 mg Oral Daily   Or  . bisacodyl  10 mg Rectal Daily  . budesonide (PULMICORT) nebulizer solution  0.25 mg Nebulization BID  . carvedilol  3.125 mg Oral BID WC  . docusate sodium  200 mg Oral Daily  . enoxaparin (LOVENOX) injection  125 mg Subcutaneous Q12H  . feeding supplement (GLUCERNA SHAKE)  237 mL Oral TID BM  . furosemide  80 mg Oral Daily  . insulin aspart  0-15 Units Subcutaneous TID WC  . levalbuterol  1.25 mg Nebulization TID  . pantoprazole  40 mg Oral Q1200  . sodium chloride  10-40 mL Intracatheter Q12H  . spironolactone  25 mg Oral Daily  . warfarin  5 mg Oral q1800  . Warfarin - Physician Dosing Inpatient   Does not apply q1800    Infusions:    PRN Medications: ALPRAZolam, levalbuterol, ondansetron (ZOFRAN) IV, oxyCODONE, RESOURCE THICKENUP CLEAR, sodium chloride, traMADol   Assessment:   1) CAD   S/p CABG x 2 10/08/13 2) STEMI 3) A/C systolic HF    EF 30-35% 4) HTN 5) OSA 6) Severe  Anxiety 7) Respiratory failure,   -- vent-mask 50% 8) Afib 9) AKI 10) DVT   -- R peroneal vein   -- on lovenox and coumadin 11) Hypokalemia  Plan/Discussion:     Stable off milrinone. Volume status stable. Weight down another pound. Continue 80 mg po  lasix daily. Continue Spiro 25 mg daily. Continue carvedilol 3.125 mg bid. Allergic to Ace. Consider irbesartan tomorrow. (was on at home).    Stop midodrine.   Remains in SR. No further A fib. Off amiodarone.    On lovenox and coumadin. INR pending.  TCTS dosing.   Continue PT/OT. Place inpatient rehab consult.    CLEGG,AMY, NP-C 8:33 AM  Patient seen with NP, agree with the above note.  - Remains in NSR, on coumadin.  - Stop midodrine today.  May not tolerate ARB with BP.  - Some volume overload with JVP 10 cm. Increase Lasix to 80 qam, 40 qpm.  - Will likely need Lifevest.  - Ambulate.   [name] 10/23/2013

## 2013-10-24 ENCOUNTER — Encounter (HOSPITAL_COMMUNITY): Payer: Self-pay | Admitting: Surgical

## 2013-10-24 DIAGNOSIS — N179 Acute kidney failure, unspecified: Secondary | ICD-10-CM

## 2013-10-24 DIAGNOSIS — D62 Acute posthemorrhagic anemia: Secondary | ICD-10-CM | POA: Insufficient documentation

## 2013-10-24 DIAGNOSIS — I379 Nonrheumatic pulmonary valve disorder, unspecified: Secondary | ICD-10-CM

## 2013-10-24 DIAGNOSIS — I5021 Acute systolic (congestive) heart failure: Secondary | ICD-10-CM

## 2013-10-24 LAB — BASIC METABOLIC PANEL
BUN: 39 mg/dL — ABNORMAL HIGH (ref 6–23)
CO2: 38 mEq/L — ABNORMAL HIGH (ref 19–32)
Calcium: 8.7 mg/dL (ref 8.4–10.5)
Chloride: 90 mEq/L — ABNORMAL LOW (ref 96–112)
Creatinine, Ser: 1.58 mg/dL — ABNORMAL HIGH (ref 0.50–1.35)
GFR calc Af Amer: 47 mL/min — ABNORMAL LOW (ref >90)
GFR calc non Af Amer: 41 mL/min — ABNORMAL LOW (ref >90)
Glucose, Bld: 95 mg/dL (ref 70–99)
Potassium: 3.5 mEq/L (ref 3.5–5.1)
Sodium: 134 mEq/L — ABNORMAL LOW (ref 135–145)

## 2013-10-24 LAB — CBC
HCT: 28 % — ABNORMAL LOW (ref 39.0–52.0)
Hemoglobin: 9.1 g/dL — ABNORMAL LOW (ref 13.0–17.0)
MCH: 29.6 pg (ref 26.0–34.0)
MCHC: 32.5 g/dL (ref 30.0–36.0)
MCV: 91.2 fL (ref 78.0–100.0)
Platelets: 352 10*3/uL (ref 150–400)
RBC: 3.07 MIL/uL — ABNORMAL LOW (ref 4.22–5.81)
RDW: 13.7 % (ref 11.5–15.5)
WBC: 9.5 10*3/uL (ref 4.0–10.5)

## 2013-10-24 LAB — PROTIME-INR
INR: 1.42 (ref 0.00–1.49)
Prothrombin Time: 17 seconds — ABNORMAL HIGH (ref 11.6–15.2)

## 2013-10-24 LAB — GLUCOSE, CAPILLARY
Glucose-Capillary: 90 mg/dL (ref 70–99)
Glucose-Capillary: 115 mg/dL — ABNORMAL HIGH (ref 70–99)

## 2013-10-24 MED ORDER — ENOXAPARIN SODIUM 120 MG/0.8ML ~~LOC~~ SOLN
110.0000 mg | Freq: Two times a day (BID) | SUBCUTANEOUS | Status: DC
  Administered 2013-10-24: 110 mg via SUBCUTANEOUS
  Filled 2013-10-24 (×2): qty 0.8

## 2013-10-24 MED ORDER — WARFARIN SODIUM 7.5 MG PO TABS
7.5000 mg | ORAL_TABLET | Freq: Every day | ORAL | Status: DC
  Administered 2013-10-24: 7.5 mg via ORAL
  Filled 2013-10-24 (×2): qty 1

## 2013-10-24 MED ORDER — PERFLUTREN LIPID MICROSPHERE
1.0000 mL | INTRAVENOUS | Status: AC | PRN
  Administered 2013-10-24: 9 mL via INTRAVENOUS
  Filled 2013-10-24: qty 10

## 2013-10-24 NOTE — Progress Notes (Signed)
3 CT sutures removed per order. Benzoin used and 1/2 inch steri strips applied.  Pt tolerated well. Frequent vitals will be taken.  Will continue to monitor patient.  Call bell within reach.

## 2013-10-24 NOTE — Progress Notes (Signed)
CARDIAC REHAB PHASE I   PRE:  Rate/Rhythm: 81SR  BP:  Supine:   Sitting: 107/61  Standing:    SaO2: 95%RA  MODE:  Ambulation: 450 ft   POST:  Rate/Rhythm: 83  BP:  Supine:   Sitting: 105/68  Standing:    SaO2: 91-100%RA in hall    Taken with rest breaks 1020-1100 Pt walked 450 ft on RA with gait belt use and rolling walker with one to follow with wheelchair. Pt gets fatigued and SOB so had him sit twice during this walk. Sats were good each time checked. Pt is concerned about life vest as he is afraid of feeling confined. Encouraged to wait for ECHO results. Emotional support given. To recliner after walk. Daughter in room. Discussed CRP 2 and permission given to refer to [place] program.   Luetta Nutting, RN BSN  10/24/2013 10:58 AM

## 2013-10-24 NOTE — Progress Notes (Signed)
  Echocardiogram 2D Echocardiogram with Definity has been performed.  Keerthi Hazell FRANCES 10/24/2013, 4:00 PM

## 2013-10-24 NOTE — Progress Notes (Addendum)
[address]       [place] [address]             [phone]      16 Days Post-Op  Procedure(s) (LRB): CORONARY ARTERY BYPASS GRAFTING (CABG) (N/A) INTRAOPERATIVE TRANSESOPHAGEAL ECHOCARDIOGRAM (N/A) Subjective: Feels ok, still fairly weak/deconditioned  Objective  Telemetry sinus  Temp:  [97.6 F (36.4 C)-98.3 F (36.8 C)] 97.7 F (36.5 C) (11/20 0530) Pulse Rate:  [80-83] 81 (11/20 0530) Resp:  [18-20] 18 (11/20 0530) BP: (104-117)/(55-70) 106/69 mmHg (11/20 0530) SpO2:  [94 %-97 %] 94 % (11/20 0530) Weight:  [248 lb 14.4 oz (112.9 kg)] 248 lb 14.4 oz (112.9 kg) (11/20 0530)   Intake/Output Summary (Last 24 hours) at 10/24/13 0750 Last data filed at 10/24/13 0400  Gross per 24 hour  Intake    480 ml  Output   1050 ml  Net   -570 ml       General appearance: alert, cooperative and no distress Heart: regular rate and rhythm Lungs: clear to auscultation bilaterally Abdomen: + BS, nontender Extremities: + LE edema Wound: incis healing well  Lab Results:  Recent Labs  10/22/13 0530 10/23/13 0635 10/24/13 0425  NA 136 134* 134*  K 3.8 3.5 3.5  CL 89* 89* 90*  CO2 41* 39* 38*  GLUCOSE 92 95 95  BUN 44* 40* 39*  CREATININE 1.66* 1.67* 1.58*  CALCIUM 9.1 8.9 8.7  PHOS 4.7*  --   --     Recent Labs  10/22/13 0530  AST 52*  ALT 54*  ALKPHOS 80  BILITOT 1.1  PROT 5.9*  ALBUMIN 2.7*   No results found for this basename: LIPASE, AMYLASE,  in the last 72 hours  Recent Labs  10/23/13 0635 10/24/13 0425  WBC 9.4 9.5  HGB 9.7* 9.1*  HCT 30.0* 28.0*  MCV 90.9 91.2  PLT 384 352   No results found for this basename: CKTOTAL, CKMB, TROPONINI,  in the last 72 hours No components found with this basename: POCBNP,  No results found for this basename: DDIMER,  in the last 72 hours No results found for this basename: HGBA1C,  in the last 72 hours No results found for this basename: CHOL, HDL, LDLCALC, TRIG, CHOLHDL,  in the  last 72 hours No results found for this basename: TSH, T4TOTAL, FREET3, T3FREE, THYROIDAB,  in the last 72 hours  Recent Labs  10/21/13 0830  TIBC 185*  IRON 38*    Medications: Scheduled . aspirin EC  81 mg Oral Daily  . atorvastatin  80 mg Oral q1800  . bisacodyl  10 mg Oral Daily   Or  . bisacodyl  10 mg Rectal Daily  . budesonide (PULMICORT) nebulizer solution  0.25 mg Nebulization BID  . carvedilol  3.125 mg Oral BID WC  . docusate sodium  200 mg Oral Daily  . enoxaparin (LOVENOX) injection  125 mg Subcutaneous Q12H  . feeding supplement (GLUCERNA SHAKE)  237 mL Oral TID BM  . furosemide  40 mg Oral q1800  . furosemide  80 mg Oral Daily  . insulin aspart  0-15 Units Subcutaneous TID WC  . levalbuterol  1.25 mg Nebulization TID  . pantoprazole  40 mg Oral Q1200  . potassium chloride  20 mEq Oral BID  . sodium chloride  10-40 mL Intracatheter Q12H  . spironolactone  25 mg Oral Daily  . warfarin  5 mg Oral q1800  . Warfarin - Physician Dosing  Inpatient   Does not apply q1800     Radiology/Studies:  Dg Chest 2 View  10/23/2013   CLINICAL DATA:  CABG.  Shortness of breath.  EXAM: CHEST  2 VIEW  COMPARISON:  10/21/2013.  FINDINGS: Right PICC line in stable position. CABG. Cardiomegaly. Normal pulmonary vascularity. Small stable left pleural effusion. No pneumothorax. No focal pulmonary infiltrate. No acute bony abnormality.  IMPRESSION: 1. CABG. Stable cardiomegaly. No pulmonary edema or pulmonary venous congestion. 2. Stable small left pleural effusion. 3. Stable PICC line position.   Electronically Signed   By: Maisie Fus  Register   On: 10/23/2013 07:05    INR:1.42 Will add last result for INR, ABG once components are confirmed Will add last 4 CBG results once components are confirmed  Assessment/Plan: S/P Procedure(s) (LRB): CORONARY ARTERY BYPASS GRAFTING (CABG) (N/A) INTRAOPERATIVE TRANSESOPHAGEAL ECHOCARDIOGRAM (N/A) d/c pacing wires today Will increase coumadin  dose CIR prob soon   LOS: 19 days    GOLD,WAYNE E 11/20/20147:50 AM  patient examined and medical record reviewed,agree with above note.  Stop lovenox and continue coumadin loading No ace-inhibitor due to elevated creat Transfer to CIR tomorrow if bed available VAN TRIGT III,PETER 10/24/2013

## 2013-10-24 NOTE — Discharge Summary (Signed)
Antonio Bernard October 18, 1933 79 y.o. [id number]  10/05/2013   Kerin Perna, MD  STEMI (ST elevation myocardial infarction) [410.90]   HPI:  The patient is a 79 year old male who with a medical history of hypertension, likely obstructive sleep apnea with obesity ( but not on treatment),and  Report of AAA he has along with anxiety. He was in his usual state of health until after playing a round of golf on Thursday, October 30. Shortly after playing he developed substernal chest pressure a roughly 6 over 10 that lasted several minutes. Since then he had intermittent episodes of similar symptoms, worsened with exertion. On the date of presentation the episode was worse and did not go away. Over time the symptoms did wax and wane somewhat and he did eventually contact EMS. He was found to have anterior ischemic EKG changes and he was felt to require prompt cardiac catheterization. He was seen in cardiology consultation by Dr. Herbie Baltimore. He was taken promptly from the emergency department to the cardiac catheterization lab for further evaluation and treatment for STEMI. Past Medical History   Diagnosis  Date   .  Acid reflux    .  Family history of anesthesia complication      daughter had problem waking up   .  Pneumonia      hx of   .  Sleep apnea      no treatment for, last sleep study over 5 years ago   .  Abdominal aortic aneurysm      found 06/29/12 "small"   .  Headache(784.0)    .  Herniated vertebral disc      thoracic area   .  Cataracts, bilateral      hx of   .  H/O Legionnaire's disease      about 24 years ago   .  Anxiety      panic attacks   .  Claustrophobia      "severe"   .  Hypertension      sees Dr. Ronne Binning [phone]   .  Obesity (BMI 35.0-39.9 without comorbidity)  10/06/2013   .  Essential hypertension  10/06/2013    Past Surgical History   Procedure  Laterality  Date   .  Shoulder surgery       left   .  Cataract extraction w/ intraocular lens implant        bilateral   .  Lumbar laminectomy/decompression microdiscectomy   07/20/2012     Procedure: LUMBAR LAMINECTOMY/DECOMPRESSION MICRODISCECTOMY 1 LEVEL; Surgeon: Reinaldo Meeker, MD; Location: [facility]; Service: Neurosurgery; Laterality: N/A; lumbar four-five left    FAMHx: He does report a medical history of his father having an MI in his late 67s early 28s.  No family history on file.  SOCHx:  reports that he has quit smoking. He does not have any smokeless tobacco history on file. He reports that he does not drink alcohol or use illicit drugs.  ALLERGIES:  Allergies   Allergen  Reactions   .  Demerol [Meperidine]  Other (See Comments)     "makes me crazy"   .  Lisinopril  Diarrhea   .  Shellfish Allergy  Rash    HOME MEDICATIONS:  Prescriptions prior to admission   Medication  Sig  Dispense  Refill   .  ALPRAZolam (XANAX) 0.25 MG tablet  Take 0.25 mg by mouth 3 (three) times daily as needed. For anxiety     .  amLODipine (NORVASC)  10 MG tablet  Take 10 mg by mouth daily.     .  Ascorbic Acid (VITAMIN C) 1000 MG tablet  Take 1,000 mg by mouth daily.     Marland Kitchen  aspirin EC 81 MG tablet  Take 81 mg by mouth daily.     .  cholecalciferol (VITAMIN D) 1000 UNITS tablet  Take 2,000 Units by mouth daily.     Marland Kitchen  esomeprazole (NEXIUM) 40 MG capsule  Take 40 mg by mouth daily before breakfast.     .  fluticasone (FLONASE) 50 MCG/ACT nasal spray  Place 2 sprays into the nose daily as needed. For allergies     .  irbesartan-hydrochlorothiazide (AVALIDE) 300-12.5 MG per tablet  Take 1 tablet by mouth daily.     .  nebivolol (BYSTOLIC) 10 MG tablet  Take 10 mg by mouth daily.        Hospital Course:  Was taken promptly to the cardiac catheterization lab for diagnostic and interventional purposes. The following results were noted:  CARDIAC CATHETERIZATION AND PERCUTANEOUS INTERVENTION REPORT  NAME: Antonio Bernard MRN: [id number]  DOB: 1933/10/17 ADMIT DATE: 10/05/2013  Procedure Date:  10/06/2013  INTERVENTIONAL CARDIOLOGIST: Marykay Lex, M.D., MS  PRIMARY CARE PROVIDER: Thayer Headings, MD  PRIMARY CARDIOLOGIST: Marykay Lex, MD  PATIENT: Antonio Bernard is a 79 y.o. male with only a medical history of hypertension, likely OSA with obesity (but not on treatment), and report of AAA along with anxiety. He was in his usual state of health until after playing a round of golf on Thursday, October 30. Shortly after playing he developed substernal chest pressure a roughly 6/10 that lasted several minutes. Since then he has had intermittent episodes of similar symptoms, worsened with exertion. He then had a severe episode earlier this evening it did not go away. The initial episode in a roughly 6 PM then had waxing and waning symptoms until it became worse somewhere around 10 to 10:30 PM. He called his daughter who then called EMS. He is brought to the emergency room I don't have 3-4 mm (and one lead roughly 5) delayed as elevations in leads V2-the 6 as well as 1 and aVL. Q waves are also noted in V2 and V3. There was reciprocal ST depressions in 2 and aVF. He was brought emergently to the cardiac catheterization lab where he was evaluated. Upon arrival his pain level is roughly 3/10 after receiving nitroglycerin and morphine.  PRE-OPERATIVE DIAGNOSIS:  Anterior STEMI, perhaps subacute PROCEDURES PERFORMED:  LEFT HEART CATHETERIZATION WITH CORONARY ANGIOGRAPHY  PERCUTANEOUS CORONARY ANGIOPLASTY (ALONE) OF THE ~OSTIAL TO MID LAD WITH A 2.5 MM AND 3.0 MM BALLOON  PLACEMENT OF AN INTRA-AORTIC BALLOON PUMP PROCEDURE:Consent: Risks of procedure as well as the alternatives and risks of each were explained to the patient. Verbal consent for procedure was obtained. Unable to obtain written onsent because of emergent medical necessity.  PROCEDURE: The patient was brought to the 2nd Floor [facility] in the fasting state and prepped and draped in the usual sterile  fashion for Right groin or radial access. A modified Allen's test with plethysmography was performed, revealing excellent Ulnar artery collateral flow. Sterile technique was used including antiseptics, cap, gloves, gown, hand hygiene, mask and sheet. Skin prep: Chlorhexidine.  Time Out: Verified patient identification, verified procedure, site/side was marked, verified correct patient position, special equipment/implants available, medications/allergies/relevent history reviewed, required imaging and test results available. Performed  Access: Right Radial Artery; 6 Fr Sheath --  Seldinger technique (Micropuncture Kit)  Radial Cocktail (intra-arterial via sheath, 10 mL), IV Angiomax Diagnostic: 5 [place] JR 4, 6 [place] XB LAD 3.5 and JL4 Guide, Angled Pigtail  Right Coronary Artery Angiography: JR 4  Left Coronary Artery Angiography: Initial images with XB LAD, exchanged for a JL4 guide  LV Hemodynamics (LV Gram): Angled pigtail TR Band: 0145 Hours, 17 mL air  MEDICATIONS:  Anesthesia: Local Lidocaine 2 ml; 18 for RFA Access - IABP Sedation: 1 mg IV Versed, 100 mcg IV fentanyl ;  Omnipaque Contrast: 235 ml  Anticoagulation: Angiomax Bolus & drip  Anti-Platelet Agent: Brilinta 180 mg, after the extensive LAD lesion noted -- Integrilin single bolus with drip initiated  IC Nitroglycerin: 200 mcg Radial Cocktail: 5 mg Verapamil, 400 mcg NTG, 2 ml 2% Lidocaine in 10 ml NS Hemodynamics:  Central Aortic / Mean Pressures: 99/66 mmHg; 81 mmHg opening pressures --> final pressures were 107/69 mmHg;  Left Ventricular Pressures / EDP: 103/26 mmHg; 40 mmHg Left Ventriculography: Not assess due to elevated LVEDP, and concern for possible LV apical thrombus  Coronary Anatomy:  Left Main: Very short, large-caliber vessel which bifurcates into the Circumflex, and LAD that is 100% occluded proximally. LAD angiography post-initial PTCA:  LAD: After the initial 100% occlusion, there is a tubular 80% stenosis  followed by tandem 70-80/90% stenoses coursing well into the mid vessel. At least 2 septal perforators including a major septal perforator trunk along with a major First Diagonal Branch (D1) are involved. Beyond the diagonal, and septal perforator trunk, the vessel then began to normalize, but is diffusely spasm downstream as it wraps the apex. D1: Moderate caliber, major diagonal branch that gives off at least 3 branches along the anterolateral wall. In the mid segment there is a roughly 60% stenosis, however the ostium of this vessel is involved with extensive LAD disease. Post-PTCA there is evidence of roughly 90% stenosis in this 90 takeoff. Unable to wire. Left Circumflex: Large-caliber, gives off very proximal small branch before bifurcating in the AV groove into the lateral OM 1 that bifurcates distally, and a smaller OM 2. There is a very small AV groove branch that comes off OM 2  OM1: Large-caliber vessel bifurcating distally along the inferolateral wall. Roughly 40% proximal lesion, the remainder the vessel is free of significant disease. Quite tortuous.  OM 2: Moderate caliber vessel bifurcating distally, could be considered posterolateral versus OM. Minimal luminal irregularities. RCA: Large-caliber, dominant vessel that gives off 2 large marginal branches one of which actually appears to course almost in tandem with a small PDA. The Right Posterior AV Groove Branch (RPAV) is a major distal branch giving rise to 2 Posterolateral Lateral branches. Minimal luminal irregularities After initial angiography, the clear-cut culprit lesion was the near ostial LAD. There did appear to be an adequate landing zone for stent in the proximal vessel, and plans are made to proceed with PTCA-PCI of the culprit lesion.. Therefore the patient was loaded with Brilinta.  Percutaneous Coronary Intervention:  Guide: After unsuccessful attempt to wire the lesion using the 6 Fr XB LAD 3.5 guide, this is exchanged for  a 6 FrJL4 guide  Guidewire: Initial attempts to wire the vessel using a pro-water wire were unsuccessful, this is exchanged for a Cougar Wire that was used to successfully advanced across the LAD lesion.  Do to the very short left main, and poor guide support, the initial wiring of the lesion was extremely difficult. With initial guide catheter and balloon support, I  was unable to advance the wire into the LAD, but was able to do so after changing for a second guide catheter. This caused at least a 5-10 minute delay initial device deployment.  Predilation Balloon: Trek 2.5 mm x 12 mm;  Initial deployment 8 Atm x 30 Sec, initial angioplasty did reveal restoration of TIMI 2 flow distally. This is partly due to 2 what appeared to be very stunned mid to distal/apical anterior wall.  3-Additional inflations from proximal to mid vessel: 10 Atm x 45 Sec,  At this point a decision was made to only do balloon angioplasty with plans for CT surgical consultation for CABG based on the extent of disease that would require close to 40-44 mm of stenting and would jail/potentially jeopardize the major diagonal branch that has a 90 takeoff. Scoring Balloon: AngioSculpt 3.0 mm x 10 mm; inflations were only made in the proximal third of the stenosed segment, as the balloon would not advance beyond that point.  Initial inflation 8 Atm x 30 Sec,  2 additional inflations: 10 Atm x 45 Sec Final-dilation Balloon: Emerge Monorail 3.0 mm x 20 mm;  Proximal inflation: 10 Atm x 60 Sec,  Mid and Distal inflations: 8 Atm x 60 Sec x 2 Post deployment angiography in multiple views, with and without guidewire in place revealed notable improvement in the downstream flow reducing the 100% lesion to roughly 50%, but with residual lesions of 60-80% noted well into the mid LAD. There was TIMI 2-3 flow, but no evidence of dissection or perforation.  Intra-Aortic Balloon Pump:  Based on the significant elevated EDP and poor downstream flow  in the LAD, the decision was made to place an Intra-Aortic Balloon Pump (IABP).  Access: The Right Common Femoral Artery was located using tactile and fluoroscopic guidance. Access was obtained using the modified Seldinger technique with fluoroscopic guidance. The 7 French balloon pump sheath was placed.  Bloom Pump Placement: The balloon pump was advanced under fluoroscopic guidance into the proximal descending aorta. All lines purged, aspirated and flushed.  Under fluoroscopic guidance, the balloon was filled an appropriate location was confirmed.  The balloon pump wasn't initiated at a 1:1 ratio with excellent augmentation of pressure. PATIENT DISPOSITION:  The patient was transferred to the CT Surgical ICU in a hemodynamicaly stable, chest pain free condition.  The patient tolerated the procedure well, and there were no complications. EBL: < 20 ml  The patient was stable before, during, and after the procedure. POST-OPERATIVE DIAGNOSIS:  Anterior STEMI, based on ECG with Q waves already present, this is likely a subacute presentation with a proximally occluded LAD. Unfortunately, following ministration the Brilinta, initial balloon angioplasty revealed a very long, extensive segment of stenoses ranging from 50-80% well into the mid vessel. A large major diagonal branch was also revealed to have significant ostial stenosis as well as moderate disease in the body of the vessel.  Based on the length of the disease segment in the involved diagonal branch that has a 90 takeoff from the native vessel that was only the wire, I felt the best to consider 2 vessel CABG with LIMA to LAD and vein graft to the diagonal as opposed to placing a long ostial to mid LAD stent that could potentially jeopardize a major diagonal branch that perfuses the entire anterolateral wall.  Balloon angioplasty alone was performed to the entire diseased segment to restore TIMI 2 to TIMI 3 flow. After flow was reestablished in the  native LAD, the diagonal did  appear to have a roughly 90% stenosis.  Successful placement of Electrocardiogram was not performed due to the severely elevated LVEDP of roughly 40 mmHg. This plus the appearance of very poor anterior wall motion on angiography led to concern for possible left ventricular thrombus  Hemodynamically stable with blood pressures ranging from the high 90s systolic to 110 systolic in the Cath Lab. Heart rate was stable in the 80s and 90s PLAN OF CARE:  Admit to ICU, I have contacted Cardiac Surgery (Dr. Donata Clay) who will see the patient in the morning.  Continue IV Integrilin and start IV heparin 2 hours post Angiomax discontinuation due to the IABP.  We'll continue IABP based on elevated LVEDP and angina prevention.  We'll add statin, convert his beta blocker to metoprolol for easier titration.  Hold ARB for now, pending evaluation of his renal function post cath. I spent well over 45 minutes discussing the patient's findings and my recommendations with the family.  Marykay Lex, M.D., M.S.  [facility]  [address]  [place], [place] [address]  [phone]  The patient was seen by Kathlee Nations Trigt M.D. who evaluated the patient and his studies and recommended surgical revascularization after ticagrelor washout. He was continued on intravenous nitroglycerin and intra-aortic balloon pump as well as heparin, and the procedure was scheduled. On 10/08/2013 he was medically stable to proceed, and was taken to the operating room where he underwent the following procedure:  DATE OF PROCEDURE: 10/08/2013  DATE OF DISCHARGE:  OPERATIVE REPORT  OPERATIONS:  1. Coronary artery bypass grafting x2 (left internal mammary artery  LAD, saphenous vein graft to diagonal).  2. Endoscopic harvest of right leg greater saphenous vein.  PREOPERATIVE DIAGNOSES: Acute anterior myocardial infarction with left  ventricular dysfunction and preoperative  balloon pump, single-vessel  coronary artery disease.  POSTOPERATIVE DIAGNOSES: Acute anterior myocardial infarction with left  ventricular dysfunction and preoperative balloon pump, single-vessel  coronary artery disease.  SURGEON: Kerin Perna, M.D.  ASSISTANT: Rowe Clack, P.A.-C.  ANESTHESIA: General by Dr. Autumn Patty. FINDINGS:  1. Hemorrhagic infarct of the anterior wall and septum.  2. Adequate targets for grafting.  3. Adequate conduit for grafting.  4. Intraoperative thrombocytopenia with platelet count less than  80,000 requiring platelet transfusion. He was taken to the surgical intensive care unit in serious condition.  Post operative hospital course:  The patient has made slow progress. He did require ventilatory assistance for several days before he could be extubated. He required supplemental oxygen for some time, however. Additionally he had significant volume overload. Over time, inotropic support was unable to be weaned, but this didn't prove to be somewhat difficult. He had some acute renal insufficiency as well and nephrology assisted with management.. Assistance in his management was additionally obtained with the advance heart failure service who put him on a very aggressive diuretic regimen. He did require transfusion for acute blood loss anemia. He had postoperative atrial fibrillation but was subsequently chemically cardioverted to a sinus rhythm. He also has had a (heel deep venous thrombosis and has been placed on anticoagulation therapy including Lovenox and Coumadin.  His INR is 1.67 and he will be discharged on a dose of 7.5 mg daily.  Please keep INR 2.0-3.0. He may require placement of a life last prior to discharge due to low ejection fraction. Echocardiogram will be addressed prior to this decision being finalized. He has been treated with intravenous Elita Quick, additionally for presumed tracheobronchitis  completing a 10 day course. At this point in his  hospitalization is felt that his primary issues are related to need for ongoing rehabilitation, and he has been evaluated by CIR. He is felt to be a candidate and we are waiting finalization of these plans. Tentatively he is felt to be stable for transfer to inpatient rehabilitation at any time.     Recent Labs  10/23/13 0635 10/24/13 0425  NA 134* 134*  K 3.5 3.5  CL 89* 90*  CO2 39* 38*  GLUCOSE 95 95  BUN 40* 39*  CALCIUM 8.9 8.7    Recent Labs  10/23/13 0635 10/24/13 0425  WBC 9.4 9.5  HGB 9.7* 9.1*  HCT 30.0* 28.0*  PLT 384 352    Recent Labs  10/23/13 0635 10/24/13 0425  INR 1.43 1.42     Discharge Instructions:  The patient is discharged to home with extensive instructions on wound care and progressive ambulation.  They are instructed not to drive or perform any heavy lifting until returning to see the physician in his office.  Discharge Diagnosis:  STEMI (ST elevation myocardial infarction) [410.90] Postoperative atrial fibrillation Postoperative acute blood loss anemia Postoperative acute renal insufficiency Secondary Diagnosis: Patient Active Problem List   Diagnosis Date Noted  . Acute systolic heart failure 10/16/2013  . Acute kidney injury 10/16/2013  . S/P CABG x 2: LIMA-LAD, SVG-DIAG 10/10/2013    Class: Chronic  . STEMI of anterior wall,  10/05/13 10/06/2013    Class: Hospitalized for  . Essential hypertension 10/06/2013    Class: Chronic  . Obesity (BMI 35.0-39.9 without comorbidity) 10/06/2013  . S/P urgent LAD PTCA 10/06/13 with IABP for LAD perfusion 10/06/2013  . Anxiety, claustrophobia 10/06/2013  . DJD  lumbar- surg August 2013 10/06/2013  . Sleep apnea- C-pap intol 10/06/2013  . AAA (abdominal aortic aneurysm)-hx of a "small" AAA found July 2013 (nothing in EPIC) 10/06/2013   Past Medical History  Diagnosis Date  . Acid reflux   . Family history of anesthesia complication     daughter had problem waking up  . Pneumonia     hx of   . Sleep apnea     no treatment for, last sleep study over 5 years ago  . Abdominal aortic aneurysm     found 06/29/12 "small"  . Headache(784.0)   . Herniated vertebral disc     thoracic area  . Cataracts, bilateral     hx of  . H/O Legionnaire's disease     about 24 years ago  . Anxiety     panic attacks   . Claustrophobia     "severe"  . Hypertension     sees  Dr. Ronne Binning [phone]  . Obesity (BMI 35.0-39.9 without comorbidity) 10/06/2013  . Essential hypertension 10/06/2013  . Anginal pain     Follow-up Information   Follow up with VAN Dinah Beers, MD. (December 10 at 1 PM to see Dr. Donata Clay. Please obtain a chest x-ray [facility] at 12 noon. [facility] is located in the same office complex.)    Specialty:  Cardiothoracic Surgery   Contact information:   [address] [place] [address] [phone]       Follow up with Marykay Lex, MD. (2 weeks-please contact the office to arrange this appointment.)    Specialty:  Cardiology   Contact information:   [address] [place] [address] [phone]     The patient has been discharged  on:   1.Beta Blocker:  Yes [x   ]                              No   [   ]                              If No, reason:  2.Ace Inhibitor/ARB: Yes [   ]                                     No  [ x   ]                                     If No, reason: Elevated Creatinine, Labile Blood pressure  3.Statin:   Yes [ x  ]                  No  [   ]                  If No, reason:  4.Ecasa:  Yes  [ x  ]                  No   [   ]                  If No, reason: Medications:    Medication List    STOP taking these medications       amLODipine 10 MG tablet  Commonly known as:  NORVASC     irbesartan-hydrochlorothiazide 300-12.5 MG per tablet  Commonly known as:  AVALIDE     nebivolol 10 MG tablet  Commonly known as:  BYSTOLIC      TAKE these medications        ALPRAZolam 0.5 MG tablet  Commonly known as:  XANAX  Take 1 tablet (0.5 mg total) by mouth 3 (three) times daily as needed for anxiety.     aspirin EC 81 MG tablet  Take 81 mg by mouth daily.     atorvastatin 80 MG tablet  Commonly known as:  LIPITOR  Take 1 tablet (80 mg total) by mouth daily at 6 PM.     carvedilol 3.125 MG tablet  Commonly known as:  COREG  Take 1 tablet (3.125 mg total) by mouth 2 (two) times daily with a meal.     cholecalciferol 1000 UNITS tablet  Commonly known as:  VITAMIN D  Take 2,000 Units by mouth daily.     DSS 100 MG Caps  Take 200 mg by mouth 2 (two) times daily as needed for mild constipation.     esomeprazole 40 MG capsule  Commonly known as:  NEXIUM  Take 40 mg by mouth daily before breakfast.     feeding supplement (GLUCERNA SHAKE) Liqd  Take 237 mLs by mouth 3 (three) times daily between meals.     fluticasone 50 MCG/ACT nasal spray  Commonly known as:  FLONASE  Place 2 sprays into the nose daily as needed. For allergies     furosemide 80 MG tablet  Commonly known as:  LASIX  Take 1 tablet (80 mg total) by mouth daily.     oxyCODONE 5 MG immediate  release tablet  Commonly known as:  Oxy IR/ROXICODONE  Take 1-2 tablets (5-10 mg total) by mouth every 3 (three) hours as needed for moderate pain.     spironolactone 25 MG tablet  Commonly known as:  ALDACTONE  Take 1 tablet (25 mg total) by mouth daily.     traMADol 50 MG tablet  Commonly known as:  ULTRAM  Take 1 tablet (50 mg total) by mouth every 6 (six) hours as needed for moderate pain.     vitamin C 1000 MG tablet  Take 1,000 mg by mouth daily.     warfarin 7.5 MG tablet  Commonly known as:  COUMADIN  Take 1 tablet (7.5 mg total) by mouth daily at 6 PM.         Disposition: CIR  Patient's condition is Good  Gershon Crane, PA-C 10/24/2013  1:49 PM

## 2013-10-24 NOTE — Progress Notes (Signed)
Removed EPW per order. All intact. Pt tolerated procedure well. Pt instructed to remain on bedrest for one hour. Pt verbalized understanding. Frequent vitals will be taken and documented. Pt resting with call bell within reach.

## 2013-10-24 NOTE — Evaluation (Signed)
Occupational Therapy Evaluation Patient Details Name: Antonio Bernard MRN: [id number] DOB: Aug 06, 1933 Today's Date: 10/24/2013 Time: 4098-1191 OT Time Calculation (min): 32 min  OT Assessment / Plan / Recommendation History of present illness Adm 11/01 with MI; 11/05 CABG x 2 and remained on vent until 11/09   Clinical Impression   Pt admitted for above.  Pt presents to OT with the below listed deficits and will benefit from continued OT to maximize safety and independence with BADLs to allow him to return home with supervision to modified independent level.  Feel pt would benefit from a brief CIR stay to allow him to return home with intermittent supervision.  Currently, he fatigues quickly and needs min - mod A overall for BADLs.    OT Assessment  Patient needs continued OT Services    Follow Up Recommendations  CIR;Supervision/Assistance - 24 hour    Barriers to Discharge      Equipment Recommendations  3 in 1 bedside comode    Recommendations for Other Services    Frequency  Min 2X/week    Precautions / Restrictions Precautions Precautions: Sternal;Fall Precaution Comments: Syncope with PT.   Pertinent Vitals/Pain     ADL  Eating/Feeding: Independent Where Assessed - Eating/Feeding: Chair Grooming: Wash/dry hands;Wash/dry face;Teeth care;Brushing hair;Min guard Where Assessed - Grooming: Unsupported standing Upper Body Bathing: Supervision/safety Where Assessed - Upper Body Bathing: Unsupported sitting Lower Body Bathing: Moderate assistance Where Assessed - Lower Body Bathing: Unsupported sit to stand Upper Body Dressing: Supervision/safety Where Assessed - Upper Body Dressing: Unsupported sitting Lower Body Dressing: Moderate assistance Where Assessed - Lower Body Dressing: Unsupported sit to stand Toilet Transfer: Min Pension scheme manager Method: Sit to stand;Stand pivot Toilet Transfer Equipment: Raised toilet seat with arms (or 3-in-1 over  toilet) Toileting - Clothing Manipulation and Hygiene: Min guard Where Assessed - Glass blower/designer Manipulation and Hygiene: Standing Equipment Used: Rolling walker Transfers/Ambulation Related to ADLs: min guard assist ADL Comments: Pt able to access feet to don/doff socks, but fatigues quickly with dyspnea 3/4 requiring significant rest break before continuing    OT Diagnosis: Generalized weakness  OT Problem List: Decreased strength;Decreased activity tolerance;Impaired balance (sitting and/or standing);Decreased knowledge of use of DME or AE;Cardiopulmonary status limiting activity OT Treatment Interventions: Self-care/ADL training;DME and/or AE instruction;Therapeutic activities;Patient/family education   OT Goals(Current goals can be found in the care plan section) Acute Rehab OT Goals Patient Stated Goal: to get better OT Goal Formulation: With patient Time For Goal Achievement: 11/14/13 Potential to Achieve Goals: Good ADL Goals Pt Will Perform Grooming: with modified independence;standing Pt Will Perform Upper Body Bathing: with set-up;sitting Pt Will Perform Lower Body Bathing: with set-up;sit to/from stand Pt Will Perform Upper Body Dressing: with set-up;sitting Pt Will Perform Lower Body Dressing: with set-up;sit to/from stand Pt Will Transfer to Toilet: with modified independence;ambulating;regular height toilet;bedside commode;grab bars Pt Will Perform Toileting - Clothing Manipulation and hygiene: with modified independence;sit to/from stand Pt Will Perform Tub/Shower Transfer: Tub transfer;with min guard assist;ambulating;shower seat;rolling walker Additional ADL Goal #1: Pt will be independent with energy conservation techniques during BADLs  Visit Information  Last OT Received On: 10/24/13 Assistance Needed: +2 History of Present Illness: Adm 11/01 with MI; 11/05 CABG x 2 and remained on vent until 11/09       Prior Functioning     Home Living Family/patient  expects to be discharged to:: Private residence Living Arrangements: Alone Available Help at Discharge: Family Type of Home: House Home Access: Stairs to enter Secretary/administrator  of Steps: 1+1 Entrance Stairs-Rails: None Home Layout: Laundry or work area in basement;Able to live on main level with bedroom/bathroom Home Equipment: Environmental consultant - 2 wheels Additional Comments: Pt reports he has no other DME than that listed Prior Function Level of Independence: Independent Comments: playing golf PTA Communication Communication: No difficulties Dominant Hand: Right         Vision/Perception     Cognition  Cognition Arousal/Alertness: Awake/alert Behavior During Therapy: WFL for tasks assessed/performed Overall Cognitive Status: Within Functional Limits for tasks assessed    Extremity/Trunk Assessment Upper Extremity Assessment Upper Extremity Assessment: Generalized weakness (within confines of sternal precautions) Lower Extremity Assessment Lower Extremity Assessment: Defer to PT evaluation Cervical / Trunk Assessment Cervical / Trunk Assessment: Normal     Mobility Bed Mobility Bed Mobility: Right Sidelying to Sit;Rolling Right;Sitting - Scoot to Edge of Bed Rolling Right: 4: Min assist Right Sidelying to Sit: 4: Min assist;HOB elevated Sitting - Scoot to [place] of Bed: 4: Min guard Details for Bed Mobility Assistance: Assist to maintain sternal precautions Transfers Transfers: Sit to Stand;Stand to Sit Sit to Stand: 4: Min guard;With upper extremity assist;From bed;From chair/3-in-1 Stand to Sit: 4: Min guard;With upper extremity assist;To bed;To chair/3-in-1 Details for Transfer Assistance: min verbal cues for hand placment     Exercise     Balance     End of Session OT - End of Session Patient left: in chair;with call bell/phone within reach;with family/visitor present Nurse Communication: Mobility status  GO     Lariah Fleer, [name] 10/24/2013, 7:10 PM

## 2013-10-24 NOTE — Progress Notes (Signed)
Advanced Heart Failure Rounding Note   Subjective:     [name] is a 79 yo male with a hx of HTN, severe anxiety, and OSA who presented to the ED 10/05/13 with a STEMI and was taken to the cath lab. Cath revealed proximally occluded LAD - after flow was restored it was apparently there was extensive LAD disease as well as severe 90-95% ostial D1 disease . There was moderate to severe focal disease of the sub-branches as well. Balloon angioplasty was performed, but no stent was placed. TIMI 2 flow was noted in the LAD and an IABP was placed. He was then evaluated by TCTS and was taken for a CABG x2 LIMA-LAD; SVG-DIAG on 10/08/13. After CABG he was brought back to the ICU still on IABP and multiple pressors. The balloon pump was weaned off 10/10/13 and he was extubated 10/13/13. Patient developed transient afib on 11/11 and was started on Amiodarone gtt.   11/11/4 Venous doppler acute deep DVT involving R peroneal vein 10/15/13 EF 30-35%. RV ok.   Doing well but feels a little sluggish. Midodrine stopped yesterday. Lasix increased to 80/40. Weight down another 2 pounds. Creatinine continues to improve slowly. No CP or SOB. Lifevest ordered.   Objective:   Weight Range:  Vital Signs:   Temp:  [97.6 F (36.4 C)-98.3 F (36.8 C)] 97.7 F (36.5 C) (11/20 0530) Pulse Rate:  [80-83] 81 (11/20 0530) Resp:  [18-20] 18 (11/20 0530) BP: (104-117)/(55-70) 106/69 mmHg (11/20 0530) SpO2:  [94 %-98 %] 94 % (11/20 0530) Weight:  [112.9 kg (248 lb 14.4 oz)] 112.9 kg (248 lb 14.4 oz) (11/20 0530) Last BM Date: 10/23/13  Weight change: Filed Weights   10/22/13 0600 10/23/13 0332 10/24/13 0530  Weight: 113.9 kg (251 lb 1.7 oz) 113.4 kg (250 lb) 112.9 kg (248 lb 14.4 oz)    Intake/Output:   Intake/Output Summary (Last 24 hours) at 10/24/13 0715 Last data filed at 10/24/13 0400  Gross per 24 hour  Intake    480 ml  Output   1050 ml  Net   -570 ml     Physical Exam: General:   NAD Lying in  bed HEENT: normal Neck: supple. JVP 5-6. Carotids 2+ bilat; no bruits. No lymphadenopathy or thryomegaly appreciated. Cor: PMI nondisplaced. Regular rate & rhythm. No rubs, gallops or murmurs.Sternal incision approximated.  Lungs: clear Abdomen: soft, nontender, non-distended. No hepatosplenomegaly. No bruits or masses. Good bowel sounds. Extremities: no cyanosis, clubbing, rash, no edema. Bilateral ted hose. RUE triple lumen picc Neuro: alert & orientedx3, cranial nerves grossly intact. moves all 4 extremities w/o difficulty. Affect pleasant  Telemetry: SR 70s  Labs: Basic Metabolic Panel:  Recent Labs Lab 10/17/13 1501  10/20/13 0439 10/21/13 0400 10/22/13 0530 10/23/13 0635 10/24/13 0425  NA 144  < > 137 135 136 134* 134*  K 3.2*  < > 3.8 4.1 3.8 3.5 3.5  CL 103  < > 90* 91* 89* 89* 90*  CO2 29  < > 37* 38* 41* 39* 38*  GLUCOSE 113*  < > 120* 88 92 95 95  BUN 72*  < > 54* 51* 44* 40* 39*  CREATININE 2.16*  < > 1.94* 1.80* 1.66* 1.67* 1.58*  CALCIUM 8.4  < > 9.1 8.7 9.1 8.9 8.7  PHOS 3.6  --   --   --  4.7*  --   --   < > = values in this interval not displayed.  Liver Function Tests:  Recent Labs  Lab 10/18/13 0424 10/19/13 0411 10/20/13 0439 10/21/13 0400 10/22/13 0530  AST 29 32 34 47* 52*  ALT 38 37 36 44 54*  ALKPHOS 76 77 81 77 80  BILITOT 1.3* 1.2 0.9 1.0 1.1  PROT 5.9* 5.9* 5.8* 5.3* 5.9*  ALBUMIN 2.6* 2.6* 2.6* 2.4* 2.7*   No results found for this basename: LIPASE, AMYLASE,  in the last 168 hours No results found for this basename: AMMONIA,  in the last 168 hours  CBC:  Recent Labs Lab 10/20/13 0439 10/21/13 0400 10/22/13 0530 10/23/13 0635 10/24/13 0425  WBC 11.1* 10.3 8.7 9.4 9.5  HGB 11.3* 10.3* 10.2* 9.7* 9.1*  HCT 33.2* 30.9* 31.4* 30.0* 28.0*  MCV 89.7 90.9 91.3 90.9 91.2  PLT 472* 407* 394 384 352    Cardiac Enzymes: No results found for this basename: CKTOTAL, CKMB, CKMBINDEX, TROPONINI,  in the last 168 hours  BNP: BNP (last  3 results)  Recent Labs  10/07/13 0400 10/17/13 0415  PROBNP 3092.0* 30734.0*    Imaging: Dg Chest 2 View  10/23/2013   CLINICAL DATA:  CABG.  Shortness of breath.  EXAM: CHEST  2 VIEW  COMPARISON:  10/21/2013.  FINDINGS: Right PICC line in stable position. CABG. Cardiomegaly. Normal pulmonary vascularity. Small stable left pleural effusion. No pneumothorax. No focal pulmonary infiltrate. No acute bony abnormality.  IMPRESSION: 1. CABG. Stable cardiomegaly. No pulmonary edema or pulmonary venous congestion. 2. Stable small left pleural effusion. 3. Stable PICC line position.   Electronically Signed   By: Maisie Fus  Register   On: 10/23/2013 07:05     Medications:     Scheduled Medications: . aspirin EC  81 mg Oral Daily  . atorvastatin  80 mg Oral q1800  . bisacodyl  10 mg Oral Daily   Or  . bisacodyl  10 mg Rectal Daily  . budesonide (PULMICORT) nebulizer solution  0.25 mg Nebulization BID  . carvedilol  3.125 mg Oral BID WC  . docusate sodium  200 mg Oral Daily  . enoxaparin (LOVENOX) injection  125 mg Subcutaneous Q12H  . feeding supplement (GLUCERNA SHAKE)  237 mL Oral TID BM  . furosemide  40 mg Oral q1800  . furosemide  80 mg Oral Daily  . insulin aspart  0-15 Units Subcutaneous TID WC  . levalbuterol  1.25 mg Nebulization TID  . pantoprazole  40 mg Oral Q1200  . potassium chloride  20 mEq Oral BID  . sodium chloride  10-40 mL Intracatheter Q12H  . spironolactone  25 mg Oral Daily  . warfarin  5 mg Oral q1800  . Warfarin - Physician Dosing Inpatient   Does not apply q1800    Infusions:    PRN Medications: ALPRAZolam, levalbuterol, ondansetron (ZOFRAN) IV, oxyCODONE, RESOURCE THICKENUP CLEAR, sodium chloride, traMADol   Assessment:   1) CAD   S/p CABG x 2 10/08/13 2) STEMI 3) A/C systolic HF    EF 30-35% 4) HTN 5) OSA 6) Severe Anxiety 7) Respiratory failure,   -- vent-mask 50% 8) Afib 9) AKI 10) DVT   -- R peroneal vein   -- on lovenox and  coumadin 11) Hypokalemia/hyponatremia  Plan/Discussion:    Continues to improve. May need to cut lasix back slightly. Wll continue current regimen for now.   Check repeat echo prior to d/c if EF > 35% may not need LifeVest. Will check orthostatics today as he has h/o orthostasis. BP too low for ACE or ARB. Can consider as outpatient.  Has qualified for  CIR which I think is great. We wil continue to follow while there.   Daniel Bensimhon,MD 7:34 AM

## 2013-10-24 NOTE — H&P (Signed)
Physical Medicine and Rehabilitation Admission H&P    Chief Complaint  Patient presents with  . Deconditioning    HPI:  Antonio Bernard is a 79 y.o. male with history of HTN, obesity, OSA, anxiety disorder; who was admitted on 10/05/13 with STEMI. he underwent urgent cardiac catheterization by Dr. Harding that demonstrated a occlusion of the LAD which was opened with PTCA and IA:PB placed to help flow through LAD. He was allowed to recover from MI and underwent CABG X 1 on 10/08/13 and extubated on 10/13/13. Has required pressors and transient A fib treated with amiodarone. He had decrease in UOP with fluid overload due to decompensated CHF, hypoxia and AKI. Dr. Coladanto consulted for input and recommended increasing diuresis as well as weaning off of dopamine. LE dopplers done due to hypoxia and right peroneal vein DVT noted and patient started on heparin for treatment.   Renal failure slowly improving and urine output improved with improvement in respiratory status. He has had syncope due to orthostasis and started on midodrine for BP support. Has been refusing CPAP. He continues to have SOB and diuretics being adjusted to help with fluid overload as well as orthostatic symptoms. LifeVest recommended due to ICM--repeat echo with severe hypokinesis and septal and apex akinesis with EF 25%. done. Patient refusing Life Vest due to "claustophobia" but willing to try for a period of time. Patient deconditioned and CIR recommended by MD and therapy team.    Review of Systems  Constitutional: Positive for malaise/fatigue. Negative for fever and chills.  HENT: Negative for hearing loss.   Eyes: Negative for blurred vision and double vision.  Respiratory: Positive for shortness of breath. Negative for cough.   Gastrointestinal: Negative for heartburn, nausea and vomiting.  Musculoskeletal:       Musculoskeletal chest pain  Neurological: Negative for dizziness, tingling and headaches.    Past  Medical History  Diagnosis Date  . Acid reflux   . Family history of anesthesia complication     daughter had problem waking up  . Pneumonia     hx of  . Sleep apnea     no treatment for, last sleep study over 5 years ago  . Abdominal aortic aneurysm     found 06/29/12 "small"  . Headache(784.0)   . Herniated vertebral disc     thoracic area  . Cataracts, bilateral     hx of  . H/O Legionnaire's disease     about 24 years ago  . Anxiety     panic attacks   . Claustrophobia     "severe"  . Hypertension     sees  Dr. Mckenzie [phone]  . Obesity (BMI 35.0-39.9 without comorbidity) 10/06/2013  . Essential hypertension 10/06/2013  . Anginal pain   . Atrial fibrillation     Postoperative  . Acute renal insufficiency     Postoperative  . Anemia associated with acute blood loss     Postoperative   Past Surgical History  Procedure Laterality Date  . Shoulder surgery      left  . Cataract extraction w/ intraocular lens implant      bilateral  . Lumbar laminectomy/decompression microdiscectomy  07/20/2012    Procedure: LUMBAR LAMINECTOMY/DECOMPRESSION MICRODISCECTOMY 1 LEVEL;  Surgeon: Randy O Kritzer, MD;  Location: [facility];  Service: Neurosurgery;  Laterality: N/A;  lumbar four-five left  . Back surgery    . Cardiac catheterization  10/05/2013  . Eye surgery    . Coronary artery bypass graft   N/A 10/08/2013    Procedure: CORONARY ARTERY BYPASS GRAFTING (CABG);  Surgeon: Peter Van Trigt, MD;  Location: [facility];  Service: Open Heart Surgery;  Laterality: N/A;  Times 2 using left internal mammary artery and endoscopically harvested left saphenous vein  . Intraoperative transesophageal echocardiogram N/A 10/08/2013    Procedure: INTRAOPERATIVE TRANSESOPHAGEAL ECHOCARDIOGRAM;  Surgeon: Peter Van Trigt, MD;  Location: [facility] OR;  Service: Open Heart Surgery;  Laterality: N/A;   History reviewed. No pertinent family history.  Social History: Lives alone and independent PTA. Per  reports that he has quit smoking. His smoking use included Cigarettes and Cigars. He smoked 0.50 packs per day. He has never used smokeless tobacco. He reports that he does not drink alcohol or use illicit drugs.    Allergies  Allergen Reactions  . Demerol [Meperidine] Other (See Comments)    "makes me crazy"  . Lisinopril Diarrhea  . Shellfish Allergy Rash   Medications Prior to Admission  Medication Sig Dispense Refill  . ALPRAZolam (XANAX) 0.25 MG tablet Take 0.25 mg by mouth 3 (three) times daily as needed. For anxiety      . amLODipine (NORVASC) 10 MG tablet Take 10 mg by mouth daily.      . Ascorbic Acid (VITAMIN C) 1000 MG tablet Take 1,000 mg by mouth daily.      . aspirin EC 81 MG tablet Take 81 mg by mouth daily.      . cholecalciferol (VITAMIN D) 1000 UNITS tablet Take 2,000 Units by mouth daily.      . esomeprazole (NEXIUM) 40 MG capsule Take 40 mg by mouth daily before breakfast.      . fluticasone (FLONASE) 50 MCG/ACT nasal spray Place 2 sprays into the nose daily as needed. For allergies      . irbesartan-hydrochlorothiazide (AVALIDE) 300-12.5 MG per tablet Take 1 tablet by mouth daily.      . nebivolol (BYSTOLIC) 10 MG tablet Take 10 mg by mouth daily.        Home: Home Living Family/patient expects to be discharged to:: Private residence Living Arrangements: Alone Available Help at Discharge: Family Type of Home: House Home Access: Stairs to enter Entrance Stairs-Number of Steps: 1+1 Entrance Stairs-Rails: None Home Layout: Laundry or work area in basement;Able to live on main level with bedroom/bathroom Home Equipment: Walker - 2 wheels Additional Comments: Pt reports he has no other DME than that listed   Functional History: Prior Function Comments: playing golf PTA  Functional Status:  Mobility: Bed Mobility Bed Mobility: Right Sidelying to Sit;Rolling Right;Sitting - Scoot to Edge of Bed Rolling Right: 4: Min assist Right Sidelying to Sit: 4: Min  assist;HOB elevated Right Sidelying to Sit: Patient Percentage: 50% Supine to Sit: 4: Min assist;HOB elevated Supine to Sit: Patient Percentage: 40% Sitting - Scoot to Edge of Bed: 4: Min guard Sit to Sidelying Right: 4: Min assist;HOB flat Transfers Transfers: Sit to Stand;Stand to Sit Sit to Stand: 4: Min guard;With upper extremity assist;From bed;From chair/3-in-1 Sit to Stand: Patient Percentage: 80% Stand to Sit: 4: Min guard;With upper extremity assist;To bed;To chair/3-in-1 Stand to Sit: Patient Percentage: 80% Transfer via Lift Equipment: [place] (floor to bed transfer with nursing assist) Ambulation/Gait Ambulation/Gait Assistance: 4: Min assist (+1 following with chair) Ambulation/Gait: Patient Percentage: 90% Ambulation Distance (Feet): 150 Feet Assistive device: Rolling walker Ambulation/Gait Assistance Details: verbal cues to look up. Gait Pattern: Step-through pattern;Decreased stride length;Wide base of support Gait velocity: decr    ADL: ADL Eating/Feeding: Independent Where   Assessed - Eating/Feeding: Chair Grooming: Wash/dry hands;Wash/dry face;Teeth care;Brushing hair;Min guard Where Assessed - Grooming: Unsupported standing Upper Body Bathing: Supervision/safety Where Assessed - Upper Body Bathing: Unsupported sitting Lower Body Bathing: Moderate assistance Where Assessed - Lower Body Bathing: Unsupported sit to stand Upper Body Dressing: Supervision/safety Where Assessed - Upper Body Dressing: Unsupported sitting Lower Body Dressing: Moderate assistance Where Assessed - Lower Body Dressing: Unsupported sit to stand Toilet Transfer: Min guard Toilet Transfer Method: Sit to stand;Stand pivot Toilet Transfer Equipment: Raised toilet seat with arms (or 3-in-1 over toilet) Equipment Used: Rolling walker Transfers/Ambulation Related to ADLs: min guard assist ADL Comments: Pt able to access feet to don/doff socks, but fatigues quickly with dyspnea 3/4 requiring  significant rest break before continuing  Cognition: Cognition Overall Cognitive Status: Within Functional Limits for tasks assessed Orientation Level: Oriented X4 Cognition Arousal/Alertness: Awake/alert Behavior During Therapy: WFL for tasks assessed/performed Overall Cognitive Status: Within Functional Limits for tasks assessed  Physical Exam: Blood pressure 120/76, pulse 83, temperature 97.7 F (36.5 C), temperature source Oral, resp. rate 22, height 6' 1" (1.854 m), weight 113.1 kg (249 lb 5.4 oz), SpO2 95.00%. Physical Exam  Nursing note and vitals reviewed. Constitutional: He appears well-developed and well-nourished. Alert and comfortable HENT: oral mucosa pink and moist. Dentition fair Head: Normocephalic.  Eyes: Conjunctivae are normal. Pupils are equal, round, and reactive to light.  Cardiovascular: Normal rate. Normal rhythm, no murmurs Respiratory: Effort normal. Chest clear without wheezes rales or rhonchi GI: Soft.  Musculoskeletal:  Chest wall, legs slightly tender  Neurological:  Reasonable insight and awareness. Strength 3+ deltoids and biceps, triceps, to 4/5 distally at wrist and hands. HF's 3, KE 3+, ankles 4/5. No gross sensory deficits.pt is alert and appropriate.  Skin:  Chest incision, legs clean and intact, still somewhat tender  Psychiatric: He has a normal mood and affect. His behavior is normal. Judgment and thought content normal.    Results for orders placed during the hospital encounter of 10/05/13 (from the past 48 hour(s))  GLUCOSE, CAPILLARY     Status: None   Collection Time    10/23/13 11:25 AM      Result Value Range   Glucose-Capillary 96  70 - 99 mg/dL   Comment 1 Notify RN    GLUCOSE, CAPILLARY     Status: None   Collection Time    10/23/13  4:15 PM      Result Value Range   Glucose-Capillary 83  70 - 99 mg/dL  GLUCOSE, CAPILLARY     Status: Abnormal   Collection Time    10/23/13  9:17 PM      Result Value Range    Glucose-Capillary 112 (*) 70 - 99 mg/dL  CBC     Status: Abnormal   Collection Time    10/24/13  4:25 AM      Result Value Range   WBC 9.5  4.0 - 10.5 K/uL   RBC 3.07 (*) 4.22 - 5.81 MIL/uL   Hemoglobin 9.1 (*) 13.0 - 17.0 g/dL   HCT 28.0 (*) 39.0 - 52.0 %   MCV 91.2  78.0 - 100.0 fL   MCH 29.6  26.0 - 34.0 pg   MCHC 32.5  30.0 - 36.0 g/dL   RDW 13.7  11.5 - 15.5 %   Platelets 352  150 - 400 K/uL  PROTIME-INR     Status: Abnormal   Collection Time    10/24/13  4:25 AM      Result Value Range     Prothrombin Time 17.0 (*) 11.6 - 15.2 seconds   INR 1.42  0.00 - 1.49  BASIC METABOLIC PANEL     Status: Abnormal   Collection Time    10/24/13  4:25 AM      Result Value Range   Sodium 134 (*) 135 - 145 mEq/L   Potassium 3.5  3.5 - 5.1 mEq/L   Chloride 90 (*) 96 - 112 mEq/L   CO2 38 (*) 19 - 32 mEq/L   Glucose, Bld 95  70 - 99 mg/dL   BUN 39 (*) 6 - 23 mg/dL   Creatinine, Ser 1.58 (*) 0.50 - 1.35 mg/dL   Calcium 8.7  8.4 - 10.5 mg/dL   GFR calc non Af Amer 41 (*) >90 mL/min   GFR calc Af Amer 47 (*) >90 mL/min   Comment: (NOTE)     The eGFR has been calculated using the CKD EPI equation.     This calculation has not been validated in all clinical situations.     eGFR's persistently <90 mL/min signify possible Chronic Kidney     Disease.  GLUCOSE, CAPILLARY     Status: None   Collection Time    10/24/13  6:35 AM      Result Value Range   Glucose-Capillary 90  70 - 99 mg/dL  GLUCOSE, CAPILLARY     Status: None   Collection Time    10/24/13 11:15 AM      Result Value Range   Glucose-Capillary 95  70 - 99 mg/dL  GLUCOSE, CAPILLARY     Status: None   Collection Time    10/24/13  5:09 PM      Result Value Range   Glucose-Capillary 95  70 - 99 mg/dL   Comment 1 Documented in Chart     Comment 2 Notify RN    GLUCOSE, CAPILLARY     Status: Abnormal   Collection Time    10/24/13  9:21 PM      Result Value Range   Glucose-Capillary 115 (*) 70 - 99 mg/dL  CBC     Status:  Abnormal   Collection Time    10/25/13  4:50 AM      Result Value Range   WBC 10.6 (*) 4.0 - 10.5 K/uL   RBC 3.07 (*) 4.22 - 5.81 MIL/uL   Hemoglobin 9.2 (*) 13.0 - 17.0 g/dL   HCT 28.3 (*) 39.0 - 52.0 %   MCV 92.2  78.0 - 100.0 fL   MCH 30.0  26.0 - 34.0 pg   MCHC 32.5  30.0 - 36.0 g/dL   RDW 14.1  11.5 - 15.5 %   Platelets 363  150 - 400 K/uL  PROTIME-INR     Status: Abnormal   Collection Time    10/25/13  4:50 AM      Result Value Range   Prothrombin Time 19.2 (*) 11.6 - 15.2 seconds   INR 1.67 (*) 0.00 - 1.49  BASIC METABOLIC PANEL     Status: Abnormal   Collection Time    10/25/13  4:50 AM      Result Value Range   Sodium 133 (*) 135 - 145 mEq/L   Potassium 3.9  3.5 - 5.1 mEq/L   Chloride 89 (*) 96 - 112 mEq/L   CO2 35 (*) 19 - 32 mEq/L   Glucose, Bld 100 (*) 70 - 99 mg/dL   BUN 40 (*) 6 - 23 mg/dL   Creatinine, Ser 1.64 (*) 0.50 - 1.35   mg/dL   Calcium 8.6  8.4 - 10.5 mg/dL   GFR calc non Af Amer 39 (*) >90 mL/min   GFR calc Af Amer 45 (*) >90 mL/min   Comment: (NOTE)     The eGFR has been calculated using the CKD EPI equation.     This calculation has not been validated in all clinical situations.     eGFR's persistently <90 mL/min signify possible Chronic Kidney     Disease.  GLUCOSE, CAPILLARY     Status: None   Collection Time    10/25/13  6:33 AM      Result Value Range   Glucose-Capillary 99  70 - 99 mg/dL   No results found.  Post Admission Physician Evaluation: 1. Functional deficits secondary  to Deconditioning after recent CABG. 2. Patient is admitted to receive collaborative, interdisciplinary care between the physiatrist, rehab nursing staff, and therapy team. 3. Patient's level of medical complexity and substantial therapy needs in context of that medical necessity cannot be provided at a lesser intensity of care such as a SNF. 4. Patient has experienced substantial functional loss from his/her baseline which was documented above under the  "Functional History" and "Functional Status" headings.  Judging by the patient's diagnosis, physical exam, and functional history, the patient has potential for functional progress which will result in measurable gains while on inpatient rehab.  These gains will be of substantial and practical use upon discharge  in facilitating mobility and self-care at the household level. 5. Physiatrist will provide 24 hour management of medical needs as well as oversight of the therapy plan/treatment and provide guidance as appropriate regarding the interaction of the two. 6. 24 hour rehab nursing will assist with bladder management, bowel management, safety, skin/wound care, disease management, medication administration, pain management and patient education  and help integrate therapy concepts, techniques,education, etc. 7. PT will assess and treat for/with: Lower extremity strength, range of motion, stamina, balance, functional mobility, safety, adaptive techniques and equipment, pain mgt, cardio-pulmonar stamina.   Goals are: mod I. 8. OT will assess and treat for/with: ADL's, functional mobility, safety, upper extremity strength, adaptive techniques and equipment, pain mgt, cardiopulmonary stamina.   Goals are: mod I. 9. SLP will assess and treat for/with: n/a.  Goals are: n/a. 10. Case Management and Social Worker will assess and treat for psychological issues and discharge planning. 11. Team conference will be held weekly to assess progress toward goals and to determine barriers to discharge. 12. Patient will receive at least 3 hours of therapy per day at least 5 days per week. 13. ELOS: 7 days       14. Prognosis:  excellent   Medical Problem List and Plan: Deconditioning due to multiple medical issues past CABG 1. R peroneal DVT: Pharmaceutical: Coumadin 2. Pain Management:  Will continue oxycodone prn--not using any at this time.  Will monitor for now.  3. Anxiety disorder with panic attacks/Mood:   Continue Xanax. Pt would like xanax at a schedule which is closer to his home regimen. ---will give prn during the day and 0.5 to.75mg at bedtime.   -Team to offer ego support and encouragement. LCSW to follow for evaluation.  4. Neuropsych: This patient is capable of making decisions on his own behalf. 5. Hypotension: Off midodrine. Monitor for tolerance with increase in activity.  6. A fib: resolved and stable off amiodarone.  7. OSA: Non compliant/intolerant of CPAP--continue to encourage use. Hypoxia resolved but back on oxygen now.  8.  Acute   on chronic systolic CHF: Modified diet. Check weights daily.  Continue lasix, amiodarone, lasix and corvedilol--Heart failure team to assist with management.  9. ICM: Fitted with life vest today  10. Acute kidney injury: Resolving. Continue to monitor lytes and urine output.  11. ABLA:  Baseline Hgb 13.9 but low iron stores noted.--will supplement. Has had slow but steady downward trend in H/H. Monitor for signs of bleeding with coumadin on board. Check stool guaiacs. Check H/H twice a week.  12. Abnormal LFTs: likely medication related. Recheck in am.  13. Stress induced hyperglycemia: Baseline Hgb A1c --5.5. Need to keep BS controlled with recent CABG to promote healing. Will continue ac BS checks and use SSI for elevated BS.   Zachary T. Swartz, MD, FAAPMR [facility]   10/25/2013 

## 2013-10-24 NOTE — Progress Notes (Signed)
ANTICOAGULATION CONSULT NOTE - Follow Up Consult  Pharmacy Consult for Lovenox Indication: DVT  Allergies  Allergen Reactions  . Demerol [Meperidine] Other (See Comments)    "makes me crazy"  . Lisinopril Diarrhea  . Shellfish Allergy Rash    Patient Measurements: Height: 6\' 1"  (185.4 cm) Weight: 248 lb 14.4 oz (112.9 kg) IBW/kg (Calculated) : 79.9  Vital Signs: Temp: 97.7 F (36.5 C) (11/20 0530) Temp src: Oral (11/20 0530) BP: 126/71 mmHg (11/20 0746) Pulse Rate: 85 (11/20 0746)  Labs:  Recent Labs  10/22/13 0530 10/23/13 0635 10/24/13 0425  HGB 10.2* 9.7* 9.1*  HCT 31.4* 30.0* 28.0*  PLT 394 384 352  LABPROT 16.8* 17.1* 17.0*  INR 1.40 1.43 1.42  CREATININE 1.66* 1.67* 1.58*    Estimated Creatinine Clearance: 51.6 ml/min (by C-G formula based on Cr of 1.58).  Assessment: 79yom continues on lovenox and coumadin for acute DVT. Patient's weight has changed since lovenox initiated, 112.9kg today, so will adjust lovenox dose. Renal function improving with sCr down to 1.58 and CrCl 92ml/min. MD continues to dose coumadin and has increased to 7.5mg  daily as INR remains subtherapeutic and unchanged at 1.42. Hgb/Hct trending down, plts stable, no bleeding reported.  Goal of Therapy:  Anti-Xa level 0.6-1.2 units/ml 4hrs after LMWH dose given Monitor platelets by anticoagulation protocol: Yes   Plan:  1) Change lovenox to 110mg  sq q12 2) Coumadin 7.5mg  daily per MD 3)  INR, CBC in AM  Fredrik Rigger 10/24/2013,12:06 PM

## 2013-10-24 NOTE — Progress Notes (Signed)
Pt ambulated 300 ft with walker and 2L O2.  Patient tolerated well, stopped x1 for due to lightheadedness.  Pt to chair after.  Will continue to monitor. Call bell within reach.

## 2013-10-25 ENCOUNTER — Inpatient Hospital Stay (HOSPITAL_COMMUNITY)
Admission: RE | Admit: 2013-10-25 | Discharge: 2013-10-31 | DRG: 945 | Disposition: A | Discharge status: Other Acute Inpatient Hospital | Payer: Medicare Other | Source: Intra-hospital | Attending: Physical Medicine & Rehabilitation | Admitting: Physical Medicine & Rehabilitation

## 2013-10-25 DIAGNOSIS — J9600 Acute respiratory failure, unspecified whether with hypoxia or hypercapnia: Secondary | ICD-10-CM

## 2013-10-25 DIAGNOSIS — I824Z9 Acute embolism and thrombosis of unspecified deep veins of unspecified distal lower extremity: Secondary | ICD-10-CM

## 2013-10-25 DIAGNOSIS — D62 Acute posthemorrhagic anemia: Secondary | ICD-10-CM

## 2013-10-25 DIAGNOSIS — G4733 Obstructive sleep apnea (adult) (pediatric): Secondary | ICD-10-CM

## 2013-10-25 DIAGNOSIS — I2510 Atherosclerotic heart disease of native coronary artery without angina pectoris: Secondary | ICD-10-CM

## 2013-10-25 DIAGNOSIS — I2109 ST elevation (STEMI) myocardial infarction involving other coronary artery of anterior wall: Secondary | ICD-10-CM

## 2013-10-25 DIAGNOSIS — E876 Hypokalemia: Secondary | ICD-10-CM

## 2013-10-25 DIAGNOSIS — N179 Acute kidney failure, unspecified: Secondary | ICD-10-CM

## 2013-10-25 DIAGNOSIS — Z87891 Personal history of nicotine dependence: Secondary | ICD-10-CM

## 2013-10-25 DIAGNOSIS — I5021 Acute systolic (congestive) heart failure: Secondary | ICD-10-CM | POA: Diagnosis present

## 2013-10-25 DIAGNOSIS — R7309 Other abnormal glucose: Secondary | ICD-10-CM

## 2013-10-25 DIAGNOSIS — F419 Anxiety disorder, unspecified: Secondary | ICD-10-CM | POA: Diagnosis present

## 2013-10-25 DIAGNOSIS — Z951 Presence of aortocoronary bypass graft: Secondary | ICD-10-CM

## 2013-10-25 DIAGNOSIS — Z7982 Long term (current) use of aspirin: Secondary | ICD-10-CM

## 2013-10-25 DIAGNOSIS — E871 Hypo-osmolality and hyponatremia: Secondary | ICD-10-CM

## 2013-10-25 DIAGNOSIS — I4891 Unspecified atrial fibrillation: Secondary | ICD-10-CM

## 2013-10-25 DIAGNOSIS — F411 Generalized anxiety disorder: Secondary | ICD-10-CM

## 2013-10-25 DIAGNOSIS — I959 Hypotension, unspecified: Secondary | ICD-10-CM

## 2013-10-25 DIAGNOSIS — R55 Syncope and collapse: Secondary | ICD-10-CM

## 2013-10-25 DIAGNOSIS — R5381 Other malaise: Secondary | ICD-10-CM

## 2013-10-25 DIAGNOSIS — Z5189 Encounter for other specified aftercare: Principal | ICD-10-CM

## 2013-10-25 DIAGNOSIS — E669 Obesity, unspecified: Secondary | ICD-10-CM | POA: Diagnosis present

## 2013-10-25 DIAGNOSIS — Z79899 Other long term (current) drug therapy: Secondary | ICD-10-CM

## 2013-10-25 DIAGNOSIS — G4730 Sleep apnea, unspecified: Secondary | ICD-10-CM | POA: Diagnosis present

## 2013-10-25 DIAGNOSIS — Z9861 Coronary angioplasty status: Secondary | ICD-10-CM

## 2013-10-25 DIAGNOSIS — I2589 Other forms of chronic ischemic heart disease: Secondary | ICD-10-CM

## 2013-10-25 DIAGNOSIS — I509 Heart failure, unspecified: Secondary | ICD-10-CM

## 2013-10-25 DIAGNOSIS — I10 Essential (primary) hypertension: Secondary | ICD-10-CM

## 2013-10-25 DIAGNOSIS — I5023 Acute on chronic systolic (congestive) heart failure: Secondary | ICD-10-CM

## 2013-10-25 LAB — CBC
HCT: 28.3 % — ABNORMAL LOW (ref 39.0–52.0)
Hemoglobin: 9.2 g/dL — ABNORMAL LOW (ref 13.0–17.0)
MCH: 30 pg (ref 26.0–34.0)
MCHC: 32.5 g/dL (ref 30.0–36.0)
MCV: 92.2 fL (ref 78.0–100.0)
Platelets: 363 10*3/uL (ref 150–400)
RBC: 3.07 MIL/uL — ABNORMAL LOW (ref 4.22–5.81)
RDW: 14.1 % (ref 11.5–15.5)
WBC: 10.6 10*3/uL — ABNORMAL HIGH (ref 4.0–10.5)

## 2013-10-25 LAB — BASIC METABOLIC PANEL
BUN: 40 mg/dL — ABNORMAL HIGH (ref 6–23)
CO2: 35 mEq/L — ABNORMAL HIGH (ref 19–32)
Calcium: 8.6 mg/dL (ref 8.4–10.5)
Chloride: 89 mEq/L — ABNORMAL LOW (ref 96–112)
Creatinine, Ser: 1.64 mg/dL — ABNORMAL HIGH (ref 0.50–1.35)
GFR calc Af Amer: 45 mL/min — ABNORMAL LOW (ref >90)
GFR calc non Af Amer: 39 mL/min — ABNORMAL LOW (ref >90)
Glucose, Bld: 100 mg/dL — ABNORMAL HIGH (ref 70–99)
Potassium: 3.9 mEq/L (ref 3.5–5.1)
Sodium: 133 mEq/L — ABNORMAL LOW (ref 135–145)

## 2013-10-25 LAB — PROTIME-INR
INR: 1.67 — ABNORMAL HIGH (ref 0.00–1.49)
Prothrombin Time: 19.2 seconds — ABNORMAL HIGH (ref 11.6–15.2)

## 2013-10-25 LAB — GLUCOSE, CAPILLARY
Glucose-Capillary: 99 mg/dL (ref 70–99)
Glucose-Capillary: 105 mg/dL — ABNORMAL HIGH (ref 70–99)

## 2013-10-25 MED ORDER — ATORVASTATIN CALCIUM 80 MG PO TABS: 80.0000 mg | ORAL_TABLET | Freq: Every day | ORAL | Status: DC

## 2013-10-25 MED ORDER — COUMADIN BOOK
Freq: Once | Status: AC
  Administered 2013-10-25: 10:00:00
  Filled 2013-10-25: qty 1

## 2013-10-25 MED ORDER — GUAIFENESIN-DM 100-10 MG/5ML PO SYRP: 5.0000 mL | ORAL_SOLUTION | Freq: Four times a day (QID) | ORAL | Status: DC | PRN

## 2013-10-25 MED ORDER — SPIRONOLACTONE 25 MG PO TABS
25.0000 mg | ORAL_TABLET | Freq: Every day | ORAL | Status: DC
  Administered 2013-10-26 – 2013-10-31 (×6): 25 mg via ORAL
  Filled 2013-10-25 (×8): qty 1

## 2013-10-25 MED ORDER — LEVALBUTEROL HCL 1.25 MG/0.5ML IN NEBU
1.2500 mg | INHALATION_SOLUTION | Freq: Three times a day (TID) | RESPIRATORY_TRACT | Status: DC
  Administered 2013-10-25: 1.25 mg via RESPIRATORY_TRACT
  Filled 2013-10-25 (×2): qty 0.5

## 2013-10-25 MED ORDER — PANTOPRAZOLE SODIUM 40 MG PO TBEC
80.0000 mg | DELAYED_RELEASE_TABLET | Freq: Every day | ORAL | Status: DC
  Administered 2013-10-26 – 2013-10-31 (×6): 80 mg via ORAL
  Filled 2013-10-25 (×6): qty 2

## 2013-10-25 MED ORDER — FUROSEMIDE 80 MG PO TABS: 80.0000 mg | ORAL_TABLET | Freq: Every day | ORAL | Status: DC

## 2013-10-25 MED ORDER — BISACODYL 10 MG RE SUPP: 10.0000 mg | Freq: Every day | RECTAL | Status: DC

## 2013-10-25 MED ORDER — ALPRAZOLAM 0.5 MG PO TABS
0.5000 mg | ORAL_TABLET | Freq: Every day | ORAL | Status: DC
  Administered 2013-10-26 – 2013-10-27 (×2): 0.75 mg via ORAL
  Administered 2013-10-28: 0.5 mg via ORAL
  Administered 2013-10-29 – 2013-10-30 (×2): 0.75 mg via ORAL
  Filled 2013-10-25 (×3): qty 1
  Filled 2013-10-25 (×4): qty 2

## 2013-10-25 MED ORDER — GLUCERNA SHAKE PO LIQD: 237.0000 mL | Freq: Three times a day (TID) | ORAL | Status: DC

## 2013-10-25 MED ORDER — GLUCERNA SHAKE PO LIQD
237.0000 mL | Freq: Three times a day (TID) | ORAL | Status: DC
  Administered 2013-10-25 – 2013-10-28 (×7): 237 mL via ORAL

## 2013-10-25 MED ORDER — LEVALBUTEROL HCL 1.25 MG/0.5ML IN NEBU
1.2500 mg | INHALATION_SOLUTION | Freq: Four times a day (QID) | RESPIRATORY_TRACT | Status: DC | PRN
  Administered 2013-10-26 – 2013-10-31 (×4): 1.25 mg via RESPIRATORY_TRACT
  Filled 2013-10-25: qty 0.5

## 2013-10-25 MED ORDER — CARVEDILOL 3.125 MG PO TABS: 3.1250 mg | ORAL_TABLET | Freq: Two times a day (BID) | ORAL | Status: DC

## 2013-10-25 MED ORDER — SPIRONOLACTONE 25 MG PO TABS: 25.0000 mg | ORAL_TABLET | Freq: Every day | ORAL | Status: DC

## 2013-10-25 MED ORDER — POTASSIUM CHLORIDE CRYS ER 20 MEQ PO TBCR
20.0000 meq | EXTENDED_RELEASE_TABLET | Freq: Every day | ORAL | Status: DC
  Administered 2013-10-25: 20 meq via ORAL

## 2013-10-25 MED ORDER — WARFARIN SODIUM 7.5 MG PO TABS: 7.5000 mg | ORAL_TABLET | Freq: Every day | ORAL | Status: DC

## 2013-10-25 MED ORDER — OXYCODONE HCL 5 MG PO TABS
5.0000 mg | ORAL_TABLET | ORAL | Status: DC | PRN
  Administered 2013-10-25 – 2013-10-30 (×5): 10 mg via ORAL
  Filled 2013-10-25 (×6): qty 2

## 2013-10-25 MED ORDER — WARFARIN - PHARMACIST DOSING INPATIENT: Freq: Every day | Status: DC

## 2013-10-25 MED ORDER — WARFARIN SODIUM 7.5 MG PO TABS
7.5000 mg | ORAL_TABLET | Freq: Every day | ORAL | Status: DC
  Administered 2013-10-25: 7.5 mg via ORAL
  Filled 2013-10-25 (×2): qty 1

## 2013-10-25 MED ORDER — ONDANSETRON HCL 4 MG PO TABS: 4.0000 mg | ORAL_TABLET | Freq: Four times a day (QID) | ORAL | Status: DC | PRN

## 2013-10-25 MED ORDER — CARVEDILOL 3.125 MG PO TABS
3.1250 mg | ORAL_TABLET | Freq: Two times a day (BID) | ORAL | Status: DC
  Administered 2013-10-25 – 2013-10-31 (×12): 3.125 mg via ORAL
  Filled 2013-10-25 (×15): qty 1

## 2013-10-25 MED ORDER — BISACODYL 5 MG PO TBEC
10.0000 mg | DELAYED_RELEASE_TABLET | Freq: Every day | ORAL | Status: DC
  Administered 2013-10-28 – 2013-10-30 (×2): 10 mg via ORAL
  Filled 2013-10-25 (×3): qty 2

## 2013-10-25 MED ORDER — PANTOPRAZOLE SODIUM 40 MG PO TBEC
80.0000 mg | DELAYED_RELEASE_TABLET | Freq: Every day | ORAL | Status: DC
  Administered 2013-10-25: 80 mg via ORAL

## 2013-10-25 MED ORDER — POTASSIUM CHLORIDE CRYS ER 20 MEQ PO TBCR
20.0000 meq | EXTENDED_RELEASE_TABLET | Freq: Every day | ORAL | Status: DC
  Administered 2013-10-26 – 2013-10-31 (×6): 20 meq via ORAL
  Filled 2013-10-25 (×7): qty 1

## 2013-10-25 MED ORDER — TRAMADOL HCL 50 MG PO TABS: 50.0000 mg | ORAL_TABLET | Freq: Four times a day (QID) | ORAL | Status: DC | PRN

## 2013-10-25 MED ORDER — ONDANSETRON HCL 4 MG/2ML IJ SOLN: 4.0000 mg | Freq: Four times a day (QID) | INTRAMUSCULAR | Status: DC | PRN

## 2013-10-25 MED ORDER — ALPRAZOLAM 0.25 MG PO TABS
0.2500 mg | ORAL_TABLET | Freq: Four times a day (QID) | ORAL | Status: DC | PRN
  Administered 2013-10-25 – 2013-10-31 (×23): 0.25 mg via ORAL
  Filled 2013-10-25 (×25): qty 1

## 2013-10-25 MED ORDER — TRAMADOL HCL 50 MG PO TABS
50.0000 mg | ORAL_TABLET | Freq: Four times a day (QID) | ORAL | Status: DC | PRN
  Administered 2013-10-27 – 2013-10-30 (×6): 50 mg via ORAL
  Filled 2013-10-25 (×7): qty 1

## 2013-10-25 MED ORDER — LEVALBUTEROL HCL 1.25 MG/0.5ML IN NEBU
1.2500 mg | INHALATION_SOLUTION | Freq: Two times a day (BID) | RESPIRATORY_TRACT | Status: DC
  Administered 2013-10-26 – 2013-10-29 (×6): 1.25 mg via RESPIRATORY_TRACT
  Filled 2013-10-25 (×13): qty 0.5

## 2013-10-25 MED ORDER — OXYCODONE HCL 5 MG PO TABS: 5.0000 mg | ORAL_TABLET | ORAL | Status: DC | PRN

## 2013-10-25 MED ORDER — ALUM & MAG HYDROXIDE-SIMETH 200-200-20 MG/5ML PO SUSP: 30.0000 mL | ORAL | Status: DC | PRN

## 2013-10-25 MED ORDER — BUDESONIDE 0.25 MG/2ML IN SUSP
0.2500 mg | Freq: Two times a day (BID) | RESPIRATORY_TRACT | Status: DC
  Administered 2013-10-25 – 2013-10-31 (×11): 0.25 mg via RESPIRATORY_TRACT
  Filled 2013-10-25 (×14): qty 2

## 2013-10-25 MED ORDER — DSS 100 MG PO CAPS: 200.0000 mg | ORAL_CAPSULE | Freq: Two times a day (BID) | ORAL | Status: DC | PRN

## 2013-10-25 MED ORDER — DOCUSATE SODIUM 100 MG PO CAPS
200.0000 mg | ORAL_CAPSULE | Freq: Every day | ORAL | Status: DC
  Administered 2013-10-26 – 2013-10-31 (×6): 200 mg via ORAL
  Filled 2013-10-25 (×7): qty 2

## 2013-10-25 MED ORDER — ALPRAZOLAM 0.5 MG PO TABS: 0.5000 mg | ORAL_TABLET | Freq: Three times a day (TID) | ORAL | Status: DC | PRN

## 2013-10-25 MED ORDER — INSULIN ASPART 100 UNIT/ML ~~LOC~~ SOLN
0.0000 [IU] | Freq: Three times a day (TID) | SUBCUTANEOUS | Status: DC
  Administered 2013-10-28 – 2013-10-31 (×4): 2 [IU] via SUBCUTANEOUS

## 2013-10-25 MED ORDER — ATORVASTATIN CALCIUM 80 MG PO TABS
80.0000 mg | ORAL_TABLET | Freq: Every day | ORAL | Status: DC
  Administered 2013-10-25 – 2013-10-30 (×6): 80 mg via ORAL
  Filled 2013-10-25 (×7): qty 1

## 2013-10-25 MED ORDER — WARFARIN VIDEO
Freq: Once | Status: AC
  Administered 2013-10-25: 10:00:00

## 2013-10-25 MED ORDER — ASPIRIN EC 81 MG PO TBEC
81.0000 mg | DELAYED_RELEASE_TABLET | Freq: Every day | ORAL | Status: DC
  Administered 2013-10-26 – 2013-10-31 (×6): 81 mg via ORAL
  Filled 2013-10-25 (×7): qty 1

## 2013-10-25 NOTE — H&P (View-Only) (Signed)
Physical Medicine and Rehabilitation Admission H&P    Chief Complaint  Patient presents with  . Deconditioning    HPI:  Antonio Bernard is a 79 y.o. male with history of HTN, obesity, OSA, anxiety disorder; who was admitted on 10/05/13 with STEMI. he underwent urgent cardiac catheterization by Dr. Herbie Baltimore that demonstrated a occlusion of the LAD which was opened with PTCA and IA:PB placed to help flow through LAD. He was allowed to recover from MI and underwent CABG X 1 on 10/08/13 and extubated on 10/13/13. Has required pressors and transient A fib treated with amiodarone. He had decrease in UOP with fluid overload due to decompensated CHF, hypoxia and AKI. Dr. Lorie Phenix consulted for input and recommended increasing diuresis as well as weaning off of dopamine. LE dopplers done due to hypoxia and right peroneal vein DVT noted and patient started on heparin for treatment.   Renal failure slowly improving and urine output improved with improvement in respiratory status. He has had syncope due to orthostasis and started on midodrine for BP support. Has been refusing CPAP. He continues to have SOB and diuretics being adjusted to help with fluid overload as well as orthostatic symptoms. LifeVest recommended due to ICM--repeat echo with severe hypokinesis and septal and apex akinesis with EF 25%. done. Patient refusing Life Vest due to "claustophobia" but willing to try for a period of time. Patient deconditioned and CIR recommended by MD and therapy team.    Review of Systems  Constitutional: Positive for malaise/fatigue. Negative for fever and chills.  HENT: Negative for hearing loss.   Eyes: Negative for blurred vision and double vision.  Respiratory: Positive for shortness of breath. Negative for cough.   Gastrointestinal: Negative for heartburn, nausea and vomiting.  Musculoskeletal:       Musculoskeletal chest pain  Neurological: Negative for dizziness, tingling and headaches.    Past  Medical History  Diagnosis Date  . Acid reflux   . Family history of anesthesia complication     daughter had problem waking up  . Pneumonia     hx of  . Sleep apnea     no treatment for, last sleep study over 5 years ago  . Abdominal aortic aneurysm     found 06/29/12 "small"  . Headache(784.0)   . Herniated vertebral disc     thoracic area  . Cataracts, bilateral     hx of  . H/O Legionnaire's disease     about 24 years ago  . Anxiety     panic attacks   . Claustrophobia     "severe"  . Hypertension     sees  Dr. Ronne Binning [phone]  . Obesity (BMI 35.0-39.9 without comorbidity) 10/06/2013  . Essential hypertension 10/06/2013  . Anginal pain   . Atrial fibrillation     Postoperative  . Acute renal insufficiency     Postoperative  . Anemia associated with acute blood loss     Postoperative   Past Surgical History  Procedure Laterality Date  . Shoulder surgery      left  . Cataract extraction w/ intraocular lens implant      bilateral  . Lumbar laminectomy/decompression microdiscectomy  07/20/2012    Procedure: LUMBAR LAMINECTOMY/DECOMPRESSION MICRODISCECTOMY 1 LEVEL;  Surgeon: Reinaldo Meeker, MD;  Location: [facility];  Service: Neurosurgery;  Laterality: N/A;  lumbar four-five left  . Back surgery    . Cardiac catheterization  10/05/2013  . Eye surgery    . Coronary artery bypass graft  N/A 10/08/2013    Procedure: CORONARY ARTERY BYPASS GRAFTING (CABG);  Surgeon: Kerin Perna, MD;  Location: [facility] OR;  Service: Open Heart Surgery;  Laterality: N/A;  Times 2 using left internal mammary artery and endoscopically harvested left saphenous vein  . Intraoperative transesophageal echocardiogram N/A 10/08/2013    Procedure: INTRAOPERATIVE TRANSESOPHAGEAL ECHOCARDIOGRAM;  Surgeon: Kerin Perna, MD;  Location: [facility] OR;  Service: Open Heart Surgery;  Laterality: N/A;   History reviewed. No pertinent family history.  Social History: Lives alone and independent PTA. Per  reports that he has quit smoking. His smoking use included Cigarettes and Cigars. He smoked 0.50 packs per day. He has never used smokeless tobacco. He reports that he does not drink alcohol or use illicit drugs.    Allergies  Allergen Reactions  . Demerol [Meperidine] Other (See Comments)    "makes me crazy"  . Lisinopril Diarrhea  . Shellfish Allergy Rash   Medications Prior to Admission  Medication Sig Dispense Refill  . ALPRAZolam (XANAX) 0.25 MG tablet Take 0.25 mg by mouth 3 (three) times daily as needed. For anxiety      . amLODipine (NORVASC) 10 MG tablet Take 10 mg by mouth daily.      . Ascorbic Acid (VITAMIN C) 1000 MG tablet Take 1,000 mg by mouth daily.      Marland Kitchen aspirin EC 81 MG tablet Take 81 mg by mouth daily.      . cholecalciferol (VITAMIN D) 1000 UNITS tablet Take 2,000 Units by mouth daily.      Marland Kitchen esomeprazole (NEXIUM) 40 MG capsule Take 40 mg by mouth daily before breakfast.      . fluticasone (FLONASE) 50 MCG/ACT nasal spray Place 2 sprays into the nose daily as needed. For allergies      . irbesartan-hydrochlorothiazide (AVALIDE) 300-12.5 MG per tablet Take 1 tablet by mouth daily.      . nebivolol (BYSTOLIC) 10 MG tablet Take 10 mg by mouth daily.        Home: Home Living Family/patient expects to be discharged to:: Private residence Living Arrangements: Alone Available Help at Discharge: Family Type of Home: House Home Access: Stairs to enter Secretary/administrator of Steps: 1+1 Entrance Stairs-Rails: None Home Layout: Laundry or work area in basement;Able to live on main level with bedroom/bathroom Home Equipment: Environmental consultant - 2 wheels Additional Comments: Pt reports he has no other DME than that listed   Functional History: Prior Function Comments: playing golf PTA  Functional Status:  Mobility: Bed Mobility Bed Mobility: Right Sidelying to Sit;Rolling Right;Sitting - Scoot to Edge of Bed Rolling Right: 4: Min assist Right Sidelying to Sit: 4: Min  assist;HOB elevated Right Sidelying to Sit: Patient Percentage: 50% Supine to Sit: 4: Min assist;HOB elevated Supine to Sit: Patient Percentage: 40% Sitting - Scoot to Edge of Bed: 4: Min guard Sit to Sidelying Right: 4: Min assist;HOB flat Transfers Transfers: Sit to Stand;Stand to Sit Sit to Stand: 4: Min guard;With upper extremity assist;From bed;From chair/3-in-1 Sit to Stand: Patient Percentage: 80% Stand to Sit: 4: Min guard;With upper extremity assist;To bed;To chair/3-in-1 Stand to Sit: Patient Percentage: 80% Transfer via Lift Equipment: [place] (floor to bed transfer with nursing assist) Ambulation/Gait Ambulation/Gait Assistance: 4: Min assist (+1 following with chair) Ambulation/Gait: Patient Percentage: 90% Ambulation Distance (Feet): 150 Feet Assistive device: Rolling walker Ambulation/Gait Assistance Details: verbal cues to look up. Gait Pattern: Step-through pattern;Decreased stride length;Wide base of support Gait velocity: decr    ADL: ADL Eating/Feeding: Independent Where  Assessed - Eating/Feeding: Chair Grooming: Wash/dry hands;Wash/dry face;Teeth care;Brushing hair;Min guard Where Assessed - Grooming: Unsupported standing Upper Body Bathing: Supervision/safety Where Assessed - Upper Body Bathing: Unsupported sitting Lower Body Bathing: Moderate assistance Where Assessed - Lower Body Bathing: Unsupported sit to stand Upper Body Dressing: Supervision/safety Where Assessed - Upper Body Dressing: Unsupported sitting Lower Body Dressing: Moderate assistance Where Assessed - Lower Body Dressing: Unsupported sit to stand Toilet Transfer: Min Pension scheme manager Method: Sit to stand;Stand pivot Toilet Transfer Equipment: Raised toilet seat with arms (or 3-in-1 over toilet) Equipment Used: Rolling walker Transfers/Ambulation Related to ADLs: min guard assist ADL Comments: Pt able to access feet to don/doff socks, but fatigues quickly with dyspnea 3/4 requiring  significant rest break before continuing  Cognition: Cognition Overall Cognitive Status: Within Functional Limits for tasks assessed Orientation Level: Oriented X4 Cognition Arousal/Alertness: Awake/alert Behavior During Therapy: WFL for tasks assessed/performed Overall Cognitive Status: Within Functional Limits for tasks assessed  Physical Exam: Blood pressure 120/76, pulse 83, temperature 97.7 F (36.5 C), temperature source Oral, resp. rate 22, height 6\' 1"  (1.854 m), weight 113.1 kg (249 lb 5.4 oz), SpO2 95.00%. Physical Exam  Nursing note and vitals reviewed. Constitutional: He appears well-developed and well-nourished. Alert and comfortable HENT: oral mucosa pink and moist. Dentition fair Head: Normocephalic.  Eyes: Conjunctivae are normal. Pupils are equal, round, and reactive to light.  Cardiovascular: Normal rate. Normal rhythm, no murmurs Respiratory: Effort normal. Chest clear without wheezes rales or rhonchi GI: Soft.  Musculoskeletal:  Chest wall, legs slightly tender  Neurological:  Reasonable insight and awareness. Strength 3+ deltoids and biceps, triceps, to 4/5 distally at wrist and hands. HF's 3, KE 3+, ankles 4/5. No gross sensory deficits.pt is alert and appropriate.  Skin:  Chest incision, legs clean and intact, still somewhat tender  Psychiatric: He has a normal mood and affect. His behavior is normal. Judgment and thought content normal.    Results for orders placed during the hospital encounter of 10/05/13 (from the past 48 hour(s))  GLUCOSE, CAPILLARY     Status: None   Collection Time    10/23/13 11:25 AM      Result Value Range   Glucose-Capillary 96  70 - 99 mg/dL   Comment 1 Notify RN    GLUCOSE, CAPILLARY     Status: None   Collection Time    10/23/13  4:15 PM      Result Value Range   Glucose-Capillary 83  70 - 99 mg/dL  GLUCOSE, CAPILLARY     Status: Abnormal   Collection Time    10/23/13  9:17 PM      Result Value Range    Glucose-Capillary 112 (*) 70 - 99 mg/dL  CBC     Status: Abnormal   Collection Time    10/24/13  4:25 AM      Result Value Range   WBC 9.5  4.0 - 10.5 K/uL   RBC 3.07 (*) 4.22 - 5.81 MIL/uL   Hemoglobin 9.1 (*) 13.0 - 17.0 g/dL   HCT 16.1 (*) 09.6 - 04.5 %   MCV 91.2  78.0 - 100.0 fL   MCH 29.6  26.0 - 34.0 pg   MCHC 32.5  30.0 - 36.0 g/dL   RDW 40.9  81.1 - 91.4 %   Platelets 352  150 - 400 K/uL  PROTIME-INR     Status: Abnormal   Collection Time    10/24/13  4:25 AM      Result Value Range  Prothrombin Time 17.0 (*) 11.6 - 15.2 seconds   INR 1.42  0.00 - 1.49  BASIC METABOLIC PANEL     Status: Abnormal   Collection Time    10/24/13  4:25 AM      Result Value Range   Sodium 134 (*) 135 - 145 mEq/L   Potassium 3.5  3.5 - 5.1 mEq/L   Chloride 90 (*) 96 - 112 mEq/L   CO2 38 (*) 19 - 32 mEq/L   Glucose, Bld 95  70 - 99 mg/dL   BUN 39 (*) 6 - 23 mg/dL   Creatinine, Ser 4.13 (*) 0.50 - 1.35 mg/dL   Calcium 8.7  8.4 - 24.4 mg/dL   GFR calc non Af Amer 41 (*) >90 mL/min   GFR calc Af Amer 47 (*) >90 mL/min   Comment: (NOTE)     The eGFR has been calculated using the CKD EPI equation.     This calculation has not been validated in all clinical situations.     eGFR's persistently <90 mL/min signify possible Chronic Kidney     Disease.  GLUCOSE, CAPILLARY     Status: None   Collection Time    10/24/13  6:35 AM      Result Value Range   Glucose-Capillary 90  70 - 99 mg/dL  GLUCOSE, CAPILLARY     Status: None   Collection Time    10/24/13 11:15 AM      Result Value Range   Glucose-Capillary 95  70 - 99 mg/dL  GLUCOSE, CAPILLARY     Status: None   Collection Time    10/24/13  5:09 PM      Result Value Range   Glucose-Capillary 95  70 - 99 mg/dL   Comment 1 Documented in Chart     Comment 2 Notify RN    GLUCOSE, CAPILLARY     Status: Abnormal   Collection Time    10/24/13  9:21 PM      Result Value Range   Glucose-Capillary 115 (*) 70 - 99 mg/dL  CBC     Status:  Abnormal   Collection Time    10/25/13  4:50 AM      Result Value Range   WBC 10.6 (*) 4.0 - 10.5 K/uL   RBC 3.07 (*) 4.22 - 5.81 MIL/uL   Hemoglobin 9.2 (*) 13.0 - 17.0 g/dL   HCT 01.0 (*) 27.2 - 53.6 %   MCV 92.2  78.0 - 100.0 fL   MCH 30.0  26.0 - 34.0 pg   MCHC 32.5  30.0 - 36.0 g/dL   RDW 64.4  03.4 - 74.2 %   Platelets 363  150 - 400 K/uL  PROTIME-INR     Status: Abnormal   Collection Time    10/25/13  4:50 AM      Result Value Range   Prothrombin Time 19.2 (*) 11.6 - 15.2 seconds   INR 1.67 (*) 0.00 - 1.49  BASIC METABOLIC PANEL     Status: Abnormal   Collection Time    10/25/13  4:50 AM      Result Value Range   Sodium 133 (*) 135 - 145 mEq/L   Potassium 3.9  3.5 - 5.1 mEq/L   Chloride 89 (*) 96 - 112 mEq/L   CO2 35 (*) 19 - 32 mEq/L   Glucose, Bld 100 (*) 70 - 99 mg/dL   BUN 40 (*) 6 - 23 mg/dL   Creatinine, Ser 5.95 (*) 0.50 - 1.35  mg/dL   Calcium 8.6  8.4 - 40.9 mg/dL   GFR calc non Af Amer 39 (*) >90 mL/min   GFR calc Af Amer 45 (*) >90 mL/min   Comment: (NOTE)     The eGFR has been calculated using the CKD EPI equation.     This calculation has not been validated in all clinical situations.     eGFR's persistently <90 mL/min signify possible Chronic Kidney     Disease.  GLUCOSE, CAPILLARY     Status: None   Collection Time    10/25/13  6:33 AM      Result Value Range   Glucose-Capillary 99  70 - 99 mg/dL   No results found.  Post Admission Physician Evaluation: 1. Functional deficits secondary  to Deconditioning after recent CABG. 2. Patient is admitted to receive collaborative, interdisciplinary care between the physiatrist, rehab nursing staff, and therapy team. 3. Patient's level of medical complexity and substantial therapy needs in context of that medical necessity cannot be provided at a lesser intensity of care such as a SNF. 4. Patient has experienced substantial functional loss from his/her baseline which was documented above under the  "Functional History" and "Functional Status" headings.  Judging by the patient's diagnosis, physical exam, and functional history, the patient has potential for functional progress which will result in measurable gains while on inpatient rehab.  These gains will be of substantial and practical use upon discharge  in facilitating mobility and self-care at the household level. 5. Physiatrist will provide 24 hour management of medical needs as well as oversight of the therapy plan/treatment and provide guidance as appropriate regarding the interaction of the two. 6. 24 hour rehab nursing will assist with bladder management, bowel management, safety, skin/wound care, disease management, medication administration, pain management and patient education  and help integrate therapy concepts, techniques,education, etc. 7. PT will assess and treat for/with: Lower extremity strength, range of motion, stamina, balance, functional mobility, safety, adaptive techniques and equipment, pain mgt, cardio-pulmonar stamina.   Goals are: mod I. 8. OT will assess and treat for/with: ADL's, functional mobility, safety, upper extremity strength, adaptive techniques and equipment, pain mgt, cardiopulmonary stamina.   Goals are: mod I. 9. SLP will assess and treat for/with: n/a.  Goals are: n/a. 10. Case Management and Social Worker will assess and treat for psychological issues and discharge planning. 11. Team conference will be held weekly to assess progress toward goals and to determine barriers to discharge. 12. Patient will receive at least 3 hours of therapy per day at least 5 days per week. 13. ELOS: 7 days       14. Prognosis:  excellent   Medical Problem List and Plan: Deconditioning due to multiple medical issues past CABG 1. R peroneal DVT: Pharmaceutical: Coumadin 2. Pain Management:  Will continue oxycodone prn--not using any at this time.  Will monitor for now.  3. Anxiety disorder with panic attacks/Mood:   Continue Xanax. Pt would like xanax at a schedule which is closer to his home regimen. ---will give prn during the day and 0.5 to.75mg  at bedtime.   -Team to offer ego support and encouragement. LCSW to follow for evaluation.  4. Neuropsych: This patient is capable of making decisions on his own behalf. 5. Hypotension: Off midodrine. Monitor for tolerance with increase in activity.  6. A fib: resolved and stable off amiodarone.  7. OSA: Non compliant/intolerant of CPAP--continue to encourage use. Hypoxia resolved but back on oxygen now.  8.  Acute  on chronic systolic CHF: Modified diet. Check weights daily.  Continue lasix, amiodarone, lasix and corvedilol--Heart failure team to assist with management.  9. ICM: Fitted with life vest today  10. Acute kidney injury: Resolving. Continue to monitor lytes and urine output.  11. ABLA:  Baseline Hgb 13.9 but low iron stores noted.--will supplement. Has had slow but steady downward trend in H/H. Monitor for signs of bleeding with coumadin on board. Check stool guaiacs. Check H/H twice a week.  12. Abnormal LFTs: likely medication related. Recheck in am.  13. Stress induced hyperglycemia: Baseline Hgb A1c --5.5. Need to keep BS controlled with recent CABG to promote healing. Will continue ac BS checks and use SSI for elevated BS.   Ranelle Oyster, MD, [facility] Physical Medicine & Rehabilitation   10/25/2013

## 2013-10-25 NOTE — Progress Notes (Signed)
Pt. Arrived from [address] [place] with RN and family. Pt. Arrived via wheelchair. Pt. And family oriented to room, safety plan, rehab procedures and call bell system with verbal understanding. Belongings with patient at time of arrival.

## 2013-10-25 NOTE — PMR Pre-admission (Signed)
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 Date: 01/05/01     Deduct:        Out of Pocket Max:        Life Max:   CIR:        SNF:   Outpatient:       Co-Pay:   Home Health:        Co-Pay:   DME:       Co-Pay:    Emergency Contact Information Contact Information   Name Relation Home Work Mobile   [place] Daughter [phone]     Powers,Cindy Daughter [phone]  [phone]   Adams,Kristy Daughter [phone]  [phone]   Migel, Hannis   [phone]   Vint, Pola   [phone]     Current Medical History  Patient Admitting Diagnosis: Deconditioning after recent CABG, with post-op AFib and CHF     History of Present Illness:  A 79 y.o. male with history of HTN, obesity, OSA, anxiety disorder; who was admitted on 10/05/13 with STEMI. he underwent urgent cardiac catheterization by Dr. Herbie Baltimore that demonstrated a occlusion of the LAD which was opened with PTCA and IA:PB placed to help flow through LAD. He was allowed to  recover from MI and underwent CABG X 1 on 10/08/13 and extubated on 10/13/13. Has required pressors and transient A fib treated with amiodarone. He had decrease in UOP with fluid overload due to decompensated CHF, hypoxia and AKI. Dr. Lorie Phenix consulted for input and recommended increasing diuresis as well as weaning off of dopamine. LE dopplers done due to hypoxia and right peroneal vein DVT noted and patient started on heparin for treatment.  Renal failure slowly improving and urine output improved with improvement in respiratory status. He has had syncope due to orthostasis and started on midodrine for BP support. Has been refusing CPAP. He continues to have SOB and diuretics being adjusted to help with fluid overload as well as orthostatic symptoms. LifeVest recommended  due to ICM--repeat echo with severe hypokinesis and septal and apex akinesis with EF 25%. done. Patient refusing Life Vest due to "claustophobia" but willing to try for a period of time. Patient has been fitted for the lifevest 10/25/13.  Patient deconditioned and CIR recommended by MD and therapy team.   Past Medical History  Past Medical History  Diagnosis Date  . Acid reflux   . Family history of anesthesia complication     daughter had problem waking up  . Pneumonia     hx of  . Sleep apnea     no treatment for, last sleep study over 5 years ago  . Abdominal aortic aneurysm     found 06/29/12 "small"  . Headache(784.0)   . Herniated vertebral disc     thoracic area  . Cataracts, bilateral     hx of  . H/O Legionnaire's disease     about 24 years ago  . Anxiety     panic attacks   . Claustrophobia     "severe"  . Hypertension     sees  Dr. Ronne Binning [phone]  . Obesity (BMI 35.0-39.9 without comorbidity) 10/06/2013  . Essential hypertension 10/06/2013  . Anginal pain   . Atrial fibrillation     Postoperative  . Acute renal insufficiency     Postoperative  . Anemia associated with acute blood loss      Postoperative    Family History  family history is not on file.  Prior Rehab/Hospitalizations:  Had outpatient therapy after shoulder surgery years ago.   Current Medications  Current facility-administered medications:ALPRAZolam Prudy Feeler) tablet 0.5 mg, 0.5 mg, Oral, TID PRN, Kerin Perna, MD, 0.5 mg at 10/25/13 0757;  aspirin EC tablet 81 mg, 81 mg, Oral, Daily, Alleen Borne, MD, 81 mg at 10/25/13 0958;  atorvastatin (LIPITOR) tablet 80 mg, 80 mg, Oral, q1800, Wayne E Gold, PA-C, 80 mg at 10/24/13 1823;  bisacodyl (DULCOLAX) EC tablet 10 mg, 10 mg, Oral, Daily, Wayne E Gold, PA-C, 10 mg at 10/22/13 8295 bisacodyl (DULCOLAX) suppository 10 mg, 10 mg, Rectal, Daily, Wayne E Gold, PA-C, 10 mg at 10/11/13 0909;  budesonide (PULMICORT) nebulizer solution 0.25 mg, 0.25 mg, Nebulization, BID, Kerin Perna, MD, 0.25 mg at 10/24/13 1944;  carvedilol (COREG) tablet 3.125 mg, 3.125 mg, Oral, BID WC, Amy D Clegg, NP, 3.125 mg at 10/25/13 0757;  coumadin book, , Does not apply, Once, [employer], RPH docusate sodium (COLACE) capsule 200 mg, 200 mg, Oral, Daily, Wayne E Gold, PA-C, 200 mg at 10/22/13 0954;  feeding supplement (GLUCERNA SHAKE) (GLUCERNA SHAKE) liquid 237 mL, 237 mL, Oral, TID BM, Ailene Ards, RD, 237 mL at 10/25/13 1000;  furosemide (LASIX) tablet 80 mg, 80 mg, Oral, Daily, Dolores Patty, MD, 80 mg at 10/25/13 0958;  insulin aspart (novoLOG) injection 0-15 Units, 0-15 Units, Subcutaneous, TID WC, Kerin Perna, MD levalbuterol Pauline Aus) nebulizer solution 1.25 mg, 1.25 mg, Nebulization, TID, Kerin Perna, MD, 1.25 mg at 10/24/13 1801;  levalbuterol (XOPENEX) nebulizer solution 1.25 mg, 1.25 mg, Nebulization, Q6H PRN, Kerin Perna, MD;  ondansetron [facility]) injection 4 mg, 4 mg, Intravenous, Q6H PRN, Wayne E Gold, PA-C;  oxyCODONE (Oxy IR/ROXICODONE) immediate release tablet 5-10 mg, 5-10 mg, Oral, Q3H PRN, Wayne E Gold, PA-C, 5 mg at 10/24/13  2301 pantoprazole (PROTONIX) EC tablet 80 mg, 80 mg, Oral, Q1200, Amy D Clegg, NP, 80 mg at 10/25/13 0959;  potassium chloride SA (K-DUR,KLOR-CON) CR tablet 20 mEq,  20 mEq, Oral, Daily, Amy D Clegg, NP, 20 mEq at 10/25/13 0957;  RESOURCE THICKENUP CLEAR, , Oral, PRN, Kerin Perna, MD;  sodium chloride 0.9 % injection 10-40 mL, 10-40 mL, Intracatheter, PRN, Kerin Perna, MD, 30 mL at 10/25/13 0457 sodium chloride 0.9 % injection 10-40 mL, 10-40 mL, Intracatheter, Q12H, Kerin Perna, MD, 10 mL at 10/25/13 1000;  spironolactone (ALDACTONE) tablet 25 mg, 25 mg, Oral, Daily, Dolores Patty, MD, 25 mg at 10/25/13 0957;  traMADol (ULTRAM) tablet 50 mg, 50 mg, Oral, Q6H PRN, Kerin Perna, MD, 50 mg at 10/24/13 0145;  warfarin (COUMADIN) tablet 7.5 mg, 7.5 mg, Oral, q1800, Wayne E Gold, PA-C, 7.5 mg at 10/24/13 1823 warfarin (COUMADIN) video, , Does not apply, Once, Crystal Salomon Fick, [facility];  Warfarin - Physician Dosing Inpatient, , Does not apply, q1800, Kerin Perna, MD  Patients Current Diet: Carb Control  Precautions / Restrictions Precautions Precautions: Sternal;Fall Precaution Comments: Syncope with PT. Restrictions Weight Bearing Restrictions: No   Prior Activity Level Community (5-7x/wk): Went out 3-4 X a week.  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  Home Assistive Devices / Equipment Home Assistive Devices/Equipment: Dan Humphreys (specify type);Cane (specify quad or straight) Home Equipment: Walker - 2 wheels  Prior Functional Level Prior Function Level of Independence: Independent Comments: playing golf PTA  Current Functional Level Cognition  Overall Cognitive Status: Within Functional Limits for tasks assessed Orientation Level: Oriented X4    Extremity Assessment (includes Sensation/Coordination)          ADLs  Eating/Feeding: Independent Where Assessed - Eating/Feeding: Chair Grooming: Wash/dry hands;Wash/dry face;Teeth care;Brushing  hair;Min guard Where Assessed - Grooming: Unsupported standing Upper Body Bathing: Supervision/safety Where Assessed - Upper Body Bathing: Unsupported sitting Lower Body Bathing: Moderate assistance Where Assessed - Lower Body Bathing: Unsupported sit to stand Upper Body Dressing: Supervision/safety Where Assessed - Upper Body Dressing: Unsupported sitting Lower Body Dressing: Moderate assistance Where Assessed - Lower Body Dressing: Unsupported sit to stand Toilet Transfer: Min Pension scheme manager Method: Sit to stand;Stand pivot Toilet Transfer Equipment: Raised toilet seat with arms (or 3-in-1 over toilet) Toileting - Clothing Manipulation and Hygiene: Min guard Where Assessed - Glass blower/designer Manipulation and Hygiene: Standing Equipment Used: Rolling walker Transfers/Ambulation Related to ADLs: min guard assist ADL Comments: Pt able to access feet to don/doff socks, but fatigues quickly with dyspnea 3/4 requiring significant rest break before continuing    Mobility  Bed Mobility: Right Sidelying to Sit;Rolling Right;Sitting - Scoot to [place] of Bed Rolling Right: 4: Min assist Right Sidelying to Sit: 4: Min assist;HOB elevated Right Sidelying to Sit: Patient Percentage: 50% Supine to Sit: 4: Min assist;HOB elevated Supine to Sit: Patient Percentage: 40% Sitting - Scoot to Edge of Bed: 4: Min guard Sit to Sidelying Right: 4: Min assist;HOB flat    Transfers  Transfers: Sit to Stand;Stand to Sit Sit to Stand: 4: Min guard;With upper extremity assist;From bed;From chair/3-in-1 Sit to Stand: Patient Percentage: 80% Stand to Sit: 4: Min guard;With upper extremity assist;To bed;To chair/3-in-1 Stand to Sit: Patient Percentage: 80% Transfer via Lift Equipment: [place] (floor to bed transfer with nursing assist)    Ambulation / Gait / Stairs / Wheelchair Mobility  Ambulation/Gait Ambulation/Gait Assistance: 4: Min assist (+1 following with chair) Ambulation/Gait: Patient  Percentage: 90% Ambulation Distance (Feet): 150 Feet Assistive device: Rolling walker Ambulation/Gait Assistance Details: verbal cues to look up. Gait Pattern: Step-through pattern;Decreased stride length;Wide base of support Gait velocity: decr  Posture / Balance Static Sitting Balance Static Sitting - Balance Support: No upper extremity supported;Feet supported Static Sitting - Level of Assistance: 5: Stand by assistance Static Standing Balance Static Standing - Balance Support: Bilateral upper extremity supported Static Standing - Level of Assistance: 4: Min assist    Special needs/care consideration BiPAP/CPAP No CPM No Continuous Drip IV NO Dialysis No         Life Vest Yes, being fitted for lifevest 10/25/13 Oxygen No Special Bed No Trach Size No Wound Vac (area) No     Skin Has sternal incision and small right leg incision                            Bowel mgmt: Has incontinence at times.  Had BM 10/24/13, loose stool. Bladder mgmt: Voiding in urinal.  Is having some incontinence of urine Diabetic mgmt:  No    Previous Home Environment Living Arrangements: Alone Available Help at Discharge: Family Type of Home: House Home Layout: Laundry or work area in basement;Able to live on main level with bedroom/bathroom Home Access: Stairs to enter Entrance Stairs-Rails: None Secretary/administrator of Steps: 1+1 Bathroom Shower/Tub: Engineer, manufacturing systems: Standard Bathroom Accessibility: Yes How Accessible: Accessible via walker Home Care Services: No Additional Comments: Pt reports he has no other DME than that listed  Discharge Living Setting Plans for Discharge Living Setting: Patient's home;Alone;House (Lives in the [place].) Type of Home at Discharge: House Discharge Home Layout: Two level;Laundry or work area in basement;Able to live on main level with bedroom/bathroom Alternate Teacher, music of Steps: Flight down to finished basement. Discharge  Home Access: Stairs to enter Entrance Stairs-Number of Steps: 2 steps garage entry Does the patient have any problems obtaining your medications?: No  Social/Family/Support Systems Patient Roles: Parent Contact Information: Marlou Starks - daughter or Coralyn Mark - daughter Anticipated Caregiver: self and children if needed Anticipated Caregiver's Contact Information: See emergency contacts Ability/Limitations of Caregiver: Has mod I goals.  Children can stay with patient if needed after rehab. Caregiver Availability: Other (Comment) (Patient prefers home alone, but can get caregiver assistance) Discharge Plan Discussed with Primary Caregiver: Yes Is Caregiver In Agreement with Plan?: Yes Does Caregiver/Family have Issues with Lodging/Transportation while Pt is in Rehab?: No  Goals/Additional Needs Patient/Family Goal for Rehab: PT/OT mod I goals Expected length of stay: 7-9 days Cultural Considerations: Musician Dietary Needs: Carb mod med calorie diet Equipment Needs: TBD Additional Information: Patient has severe claustrophobia and anxiety.  He takes xanax for the disorder. Pt/Family Agrees to Admission and willing to participate: Yes Program Orientation Provided & Reviewed with Pt/Caregiver Including Roles  & Responsibilities: Yes  Decrease burden of Care through IP rehab admission:  N/A  Possible need for SNF placement upon discharge: Not planned  Patient Condition: This patient's medical and functional status has changed since the consult dated: 10/23/13 in which the Rehabilitation Physician determined and documented that the patient's condition is appropriate for intensive rehabilitative care in an inpatient rehabilitation facility. See "History of Present Illness" (above) for medical update. Functional changes are: Currently requiring min/mod A with ADLs. Patient's medical and functional status update has been discussed with the Rehabilitation physician and patient  remains appropriate for inpatient rehabilitation. Will admit to inpatient rehab today.  Preadmission Screen Completed By:  Trish Mage, 10/25/2013 12:02 PM ______________________________________________________________________   Discussed status with Dr. Riley Kill on 10/25/13 at 1203 and received telephone approval for admission today.  Admission Coordinator:  Trish Mage, time1203/Date11/21/14

## 2013-10-25 NOTE — Progress Notes (Signed)
Advanced Heart Failure Rounding Note   Subjective:     [name] is a 79 yo male with a hx of HTN, severe anxiety, and OSA who presented to the ED 10/05/13 with a STEMI and was taken to the cath lab. Cath revealed proximally occluded LAD - after flow was restored it was apparently there was extensive LAD disease as well as severe 90-95% ostial D1 disease . There was moderate to severe focal disease of the sub-branches as well. Balloon angioplasty was performed, but no stent was placed. TIMI 2 flow was noted in the LAD and an IABP was placed. He was then evaluated by TCTS and was taken for a CABG x2 LIMA-LAD; SVG-DIAG on 10/08/13. After CABG he was brought back to the ICU still on IABP and multiple pressors. The balloon pump was weaned off 10/10/13 and he was extubated 10/13/13. Patient developed transient afib on 11/11 and was started on Amiodarone gtt.   11/11/4 Venous doppler acute deep DVT involving R peroneal vein 10/15/13 EF 30-35%. RV ok.  10/24/13 ECHO EF 25% RV ok.   Yesterday he ambulated 300 feet.MIld dizziness when standing. Denies SOB/PND/Orthopnea/CP.  Weight up 1 pound.   Objective:   Weight Range:  Vital Signs:   Temp:  [97.7 F (36.5 C)-97.9 F (36.6 C)] 97.7 F (36.5 C) (11/21 0317) Pulse Rate:  [72-85] 83 (11/21 0317) Resp:  [19-22] 22 (11/21 0317) BP: (93-126)/(52-76) 120/76 mmHg (11/21 0317) SpO2:  [94 %-100 %] 95 % (11/21 0317) Weight:  [249 lb 5.4 oz (113.1 kg)] 249 lb 5.4 oz (113.1 kg) (11/21 0317) Last BM Date: 10/24/13  Weight change: Filed Weights   10/23/13 0332 10/24/13 0530 10/25/13 0317  Weight: 250 lb (113.4 kg) 248 lb 14.4 oz (112.9 kg) 249 lb 5.4 oz (113.1 kg)    Intake/Output:   Intake/Output Summary (Last 24 hours) at 10/25/13 0730 Last data filed at 10/24/13 2213  Gross per 24 hour  Intake    240 ml  Output    275 ml  Net    -35 ml     Physical Exam: General:   NAD Lying in bed HEENT: normal Neck: supple. JVP 5-6. Carotids 2+ bilat;  no bruits. No lymphadenopathy or thryomegaly appreciated. Cor: PMI nondisplaced. Regular rate & rhythm. No rubs, gallops or murmurs.Sternal incision approximated.  Lungs: clear Abdomen: soft, nontender, non-distended. No hepatosplenomegaly. No bruits or masses. Good bowel sounds. Extremities: no cyanosis, clubbing, rash, no edema. Bilateral ted hose. RLE 1+ edema.  RUE triple lumen picc Neuro: alert & orientedx3, cranial nerves grossly intact. moves all 4 extremities w/o difficulty. Affect pleasant  Telemetry: SR 80s  Labs: Basic Metabolic Panel:  Recent Labs Lab 10/21/13 0400 10/22/13 0530 10/23/13 0635 10/24/13 0425 10/25/13 0450  NA 135 136 134* 134* 133*  K 4.1 3.8 3.5 3.5 3.9  CL 91* 89* 89* 90* 89*  CO2 38* 41* 39* 38* 35*  GLUCOSE 88 92 95 95 100*  BUN 51* 44* 40* 39* 40*  CREATININE 1.80* 1.66* 1.67* 1.58* 1.64*  CALCIUM 8.7 9.1 8.9 8.7 8.6  PHOS  --  4.7*  --   --   --     Liver Function Tests:  Recent Labs Lab 10/19/13 0411 10/20/13 0439 10/21/13 0400 10/22/13 0530  AST 32 34 47* 52*  ALT 37 36 44 54*  ALKPHOS 77 81 77 80  BILITOT 1.2 0.9 1.0 1.1  PROT 5.9* 5.8* 5.3* 5.9*  ALBUMIN 2.6* 2.6* 2.4* 2.7*   No results  found for this basename: LIPASE, AMYLASE,  in the last 168 hours No results found for this basename: AMMONIA,  in the last 168 hours  CBC:  Recent Labs Lab 10/21/13 0400 10/22/13 0530 10/23/13 0635 10/24/13 0425 10/25/13 0450  WBC 10.3 8.7 9.4 9.5 10.6*  HGB 10.3* 10.2* 9.7* 9.1* 9.2*  HCT 30.9* 31.4* 30.0* 28.0* 28.3*  MCV 90.9 91.3 90.9 91.2 92.2  PLT 407* 394 384 352 363    Cardiac Enzymes: No results found for this basename: CKTOTAL, CKMB, CKMBINDEX, TROPONINI,  in the last 168 hours  BNP: BNP (last 3 results)  Recent Labs  10/07/13 0400 10/17/13 0415  PROBNP 3092.0* 30734.0*    Imaging: No results found.   Medications:     Scheduled Medications: . aspirin EC  81 mg Oral Daily  . atorvastatin  80 mg Oral  q1800  . bisacodyl  10 mg Oral Daily   Or  . bisacodyl  10 mg Rectal Daily  . budesonide (PULMICORT) nebulizer solution  0.25 mg Nebulization BID  . carvedilol  3.125 mg Oral BID WC  . docusate sodium  200 mg Oral Daily  . feeding supplement (GLUCERNA SHAKE)  237 mL Oral TID BM  . furosemide  40 mg Oral q1800  . furosemide  80 mg Oral Daily  . insulin aspart  0-15 Units Subcutaneous TID WC  . levalbuterol  1.25 mg Nebulization TID  . pantoprazole  40 mg Oral Q1200  . potassium chloride  20 mEq Oral BID  . sodium chloride  10-40 mL Intracatheter Q12H  . spironolactone  25 mg Oral Daily  . warfarin  7.5 mg Oral q1800  . Warfarin - Physician Dosing Inpatient   Does not apply q1800    Infusions:    PRN Medications: ALPRAZolam, levalbuterol, ondansetron (ZOFRAN) IV, oxyCODONE, RESOURCE THICKENUP CLEAR, sodium chloride, traMADol   Assessment:   1) CAD   S/p CABG x 2 10/08/13 2) STEMI 3) A/C systolic HF    EF 30-35% 4) HTN 5) OSA 6) Severe Anxiety 7) Respiratory failure,   -- vent-mask 50% 8) Afib 9) AKI 10) DVT   -- R peroneal vein   -- on lovenox and coumadin 11) Hypokalemia/hyponatremia  Plan/Discussion:    Volume status a little low. Will cut back lasix to 80 mg daily. Continue spironolactone 25 mg daily. Continue carvedilol 3.125 mg twice a day. Renal function stable. Cut back potassium. No ARB yet due to soft BP. Will not use ace due to allergy--> diarrhea.   EF 25%. Will need lifevest but not sure he will be agreeable due to claustrophobia. He says that he will try it but not sure for how long.   Remains in SR.   Had DVT RLE.  On coumadin. INR 1.67 Per Dr Maren Beach. ? Add lovenox  CLEGG,AMY, NP-C 7:30 AM   Patient seen and examined with Tonye Becket, NP. We discussed all aspects of the encounter. I agree with the assessment and plan as stated above.   Continues to improve. Agree with cutting lasix back to 80 daily.   Echo reviewed EF 25% Agree with lifevest.  Will need to try ARB at some point as BP tolerates.   Continue coumadin for PE. Would add lovenox until INR > 2.0 if ok with CVTS. We will follow in CIR.  Daniel Bensimhon,MD 12:19 PM

## 2013-10-25 NOTE — Progress Notes (Signed)
ANTICOAGULATION CONSULT NOTE - Follow Up Consult  Pharmacy Consult for Coumadin Indication: DVT  Allergies  Allergen Reactions  . Demerol [Meperidine] Other (See Comments)    "makes me crazy"  . Lisinopril Diarrhea  . Shellfish Allergy Rash   Labs:  Recent Labs  10/23/13 0635 10/24/13 0425 10/25/13 0450  HGB 9.7* 9.1* 9.2*  HCT 30.0* 28.0* 28.3*  PLT 384 352 363  LABPROT 17.1* 17.0* 19.2*  INR 1.43 1.42 1.67*  CREATININE 1.67* 1.58* 1.64*    Estimated Creatinine Clearance: 49.7 ml/min (by C-G formula based on Cr of 1.64).  Assessment: 79yom known to pharmacy from hospital admission now transferred to rehab.  He continue on Coumadin for acute DVT. INR today is 1.67.  Lovenox was stopped 11/20 PM  Goal of Therapy:  INR = 2 to 3 Monitor platelets by anticoagulation protocol: Yes   Plan:  1) Coumadin 7.5 mg po daily at 1800 pm 2) Daily INR  Thank you. Okey Regal, PharmD [phone]  10/25/2013,5:06 PM

## 2013-10-25 NOTE — Progress Notes (Signed)
      [address]       [place] [address]             [phone]      17 Days Post-Op Procedure(s) (LRB): CORONARY ARTERY BYPASS GRAFTING (CABG) (N/A) INTRAOPERATIVE TRANSESOPHAGEAL ECHOCARDIOGRAM (N/A)  Subjective:  Mr. Debruyne has no complaints this morning.  He is ready to go to rehab.  He was being fitted for a lifevest during the exam.  Objective: Vital signs in last 24 hours: Temp:  [97.7 F (36.5 C)-97.9 F (36.6 C)] 97.7 F (36.5 C) (11/21 0317) Pulse Rate:  [72-84] 83 (11/21 0317) Cardiac Rhythm:  [-] Normal sinus rhythm (11/21 0926) Resp:  [19-22] 22 (11/21 0317) BP: (93-120)/(52-76) 120/76 mmHg (11/21 0317) SpO2:  [94 %-100 %] 95 % (11/21 0317) Weight:  [249 lb 5.4 oz (113.1 kg)] 249 lb 5.4 oz (113.1 kg) (11/21 0317)  Intake/Output from previous day: 11/20 0701 - 11/21 0700 In: 240 [P.O.:240] Out: 275 [Urine:275]  General appearance: alert, cooperative and no distress Heart: regular rate and rhythm Lungs: clear to auscultation bilaterally Abdomen: soft, non-tender; bowel sounds normal; no masses,  no organomegaly Extremities: edema 1+ pitting Wound: clean and dry  Lab Results:  Recent Labs  10/24/13 0425 10/25/13 0450  WBC 9.5 10.6*  HGB 9.1* 9.2*  HCT 28.0* 28.3*  PLT 352 363   BMET:  Recent Labs  10/24/13 0425 10/25/13 0450  NA 134* 133*  K 3.5 3.9  CL 90* 89*  CO2 38* 35*  GLUCOSE 95 100*  BUN 39* 40*  CREATININE 1.58* 1.64*  CALCIUM 8.7 8.6    PT/INR:  Recent Labs  10/25/13 0450  LABPROT 19.2*  INR 1.67*   ABG    Component Value Date/Time   PHART 7.425 10/14/2013 0441   HCO3 25.1* 10/14/2013 0441   TCO2 34 10/19/2013 2028   ACIDBASEDEF 2.0 10/08/2013 1524   O2SAT 75.2 10/22/2013 1619   CBG (last 3)   Recent Labs  10/24/13 2121 10/25/13 0633 10/25/13 1108  GLUCAP 115* 99 105*    Assessment/Plan: S/P Procedure(s) (LRB): CORONARY ARTERY BYPASS GRAFTING (CABG) (N/A) INTRAOPERATIVE  TRANSESOPHAGEAL ECHOCARDIOGRAM (N/A)  1. CV- NSR rate and pressure controlled- on Coreg 2. Pulm- on oxygen, will wean as tolerated 3. DVT RLE- INR 1.67 continue Coumadin at 7.5 mg daily 4. Renal- creatinine elevated, remains volume overloaded continue Lasix, Spironolactone 5. Deconditioning- pt needs inpatient rehab 6. Dispo- patient is medically stable, lifevest arranged, will d/c to CIR today   LOS: 20 days    Raford Pitcher, Denny Peon 10/25/2013

## 2013-10-25 NOTE — Progress Notes (Signed)
[id number] Education completed with pt and daughter. Gave CHF booklet and reviewed zones. Pt knows to weigh daily. Discussed watching low sodium diet due to low EF. Referring to [facility] Phase 2. Duanne Limerick, RN  2:33 PM 10/25/2013

## 2013-10-25 NOTE — Interval H&P Note (Signed)
Antonio Bernard was admitted today to Inpatient Rehabilitation with the diagnosis of deconditioning after CABG/multiple medical issues.  The patient's history has been reviewed, patient examined, and there is no change in status.  Patient continues to be appropriate for intensive inpatient rehabilitation.  I have reviewed the patient's chart and labs.  Questions were answered to the patient's satisfaction.  SWARTZ,ZACHARY T 10/25/2013, 6:07 PM

## 2013-10-25 NOTE — Progress Notes (Signed)
Rehab admissions - Once lifevest is in the room, we can then plan admit for today to inpatient rehab.  Bed available and will plan to admit to acute inpatient rehab today.  Call me for questions.  #[phone]

## 2013-10-26 ENCOUNTER — Inpatient Hospital Stay (HOSPITAL_COMMUNITY): Payer: Medicare Other | Admitting: *Deleted

## 2013-10-26 ENCOUNTER — Inpatient Hospital Stay (HOSPITAL_COMMUNITY): Payer: Medicare Other | Admitting: Physical Therapy

## 2013-10-26 DIAGNOSIS — I509 Heart failure, unspecified: Secondary | ICD-10-CM

## 2013-10-26 LAB — BASIC METABOLIC PANEL
BUN: 44 mg/dL — ABNORMAL HIGH (ref 6–23)
CO2: 34 mEq/L — ABNORMAL HIGH (ref 19–32)
GFR calc Af Amer: 44 mL/min — ABNORMAL LOW (ref >90)
GFR calc non Af Amer: 38 mL/min — ABNORMAL LOW (ref >90)
Glucose, Bld: 100 mg/dL — ABNORMAL HIGH (ref 70–99)
Potassium: 3.8 mEq/L (ref 3.5–5.1)

## 2013-10-26 LAB — CBC
HCT: 26.7 % — ABNORMAL LOW (ref 39.0–52.0)
Hemoglobin: 8.9 g/dL — ABNORMAL LOW (ref 13.0–17.0)
MCH: 30.5 pg (ref 26.0–34.0)
MCHC: 33.3 g/dL (ref 30.0–36.0)
MCV: 91.4 fL (ref 78.0–100.0)

## 2013-10-26 LAB — PROTIME-INR
INR: 2.1 — ABNORMAL HIGH (ref 0.00–1.49)
Prothrombin Time: 22.9 seconds — ABNORMAL HIGH (ref 11.6–15.2)

## 2013-10-26 LAB — GLUCOSE, CAPILLARY
Glucose-Capillary: 98 mg/dL (ref 70–99)
Glucose-Capillary: 98 mg/dL (ref 70–99)
Glucose-Capillary: 97 mg/dL (ref 70–99)

## 2013-10-26 MED ORDER — ALTEPLASE 2 MG IJ SOLR
2.0000 mg | Freq: Once | INTRAMUSCULAR | Status: AC
  Administered 2013-10-26: 2 mg
  Filled 2013-10-26 (×2): qty 2

## 2013-10-26 MED ORDER — ALTEPLASE 2 MG IJ SOLR
2.0000 mg | Freq: Once | INTRAMUSCULAR | Status: AC
  Administered 2013-10-26: 2 mg
  Filled 2013-10-26: qty 2

## 2013-10-26 MED ORDER — SODIUM CHLORIDE 0.9 % IJ SOLN
10.0000 mL | INTRAMUSCULAR | Status: DC | PRN
  Administered 2013-10-26 (×2): 10 mL
  Administered 2013-10-26: 30 mL
  Administered 2013-10-28 (×2): 10 mL
  Administered 2013-10-28: 30 mL
  Administered 2013-10-28 – 2013-10-31 (×5): 10 mL

## 2013-10-26 MED ORDER — WARFARIN SODIUM 5 MG PO TABS
5.0000 mg | ORAL_TABLET | Freq: Once | ORAL | Status: AC
  Administered 2013-10-26: 5 mg via ORAL
  Filled 2013-10-26: qty 1

## 2013-10-26 NOTE — Evaluation (Signed)
Physical Therapy Assessment and Plan  Patient Details  Name: Antonio Bernard MRN: [id number] Date of Birth: 1933/11/18  PT Diagnosis: Dizziness and giddiness and Muscle weakness Rehab Potential: Good ELOS: 10-12 days   Today's Date: 10/26/2013 Time: 0900-1000 Total time calculation: 60 Minutes  Problem List:  Patient Active Problem List   Diagnosis Date Noted  . Physical deconditioning 10/25/2013  . Anemia associated with acute blood loss   . Acute systolic heart failure 10/16/2013  . Acute kidney injury 10/16/2013  . S/P CABG x 2: LIMA-LAD, SVG-DIAG 10/10/2013    Class: Chronic  . STEMI of anterior wall,  10/05/13 10/06/2013    Class: Hospitalized for  . Essential hypertension 10/06/2013    Class: Chronic  . Obesity (BMI 35.0-39.9 without comorbidity) 10/06/2013  . S/P urgent LAD PTCA 10/06/13 with IABP for LAD perfusion 10/06/2013  . Anxiety, claustrophobia 10/06/2013  . DJD  lumbar- surg August 2013 10/06/2013  . Sleep apnea- C-pap intol 10/06/2013  . AAA (abdominal aortic aneurysm)-hx of a "small" AAA found July 2013 (nothing in EPIC) 10/06/2013    Past Medical History:  Past Medical History  Diagnosis Date  . Acid reflux   . Family history of anesthesia complication     daughter had problem waking up  . Pneumonia     hx of  . Sleep apnea     no treatment for, last sleep study over 5 years ago  . Abdominal aortic aneurysm     found 06/29/12 "small"  . Headache(784.0)   . Herniated vertebral disc     thoracic area  . Cataracts, bilateral     hx of  . H/O Legionnaire's disease     about 24 years ago  . Anxiety     panic attacks   . Claustrophobia     "severe"  . Hypertension     sees  Dr. Ronne Binning [phone]  . Obesity (BMI 35.0-39.9 without comorbidity) 10/06/2013  . Essential hypertension 10/06/2013  . Anginal pain   . Atrial fibrillation     Postoperative  . Acute renal insufficiency     Postoperative  . Anemia associated with acute blood loss      Postoperative   Past Surgical History:  Past Surgical History  Procedure Laterality Date  . Shoulder surgery      left  . Cataract extraction w/ intraocular lens implant      bilateral  . Lumbar laminectomy/decompression microdiscectomy  07/20/2012    Procedure: LUMBAR LAMINECTOMY/DECOMPRESSION MICRODISCECTOMY 1 LEVEL;  Surgeon: Reinaldo Meeker, MD;  Location: [facility];  Service: Neurosurgery;  Laterality: N/A;  lumbar four-five left  . Back surgery    . Cardiac catheterization  10/05/2013  . Eye surgery    . Coronary artery bypass graft N/A 10/08/2013    Procedure: CORONARY ARTERY BYPASS GRAFTING (CABG);  Surgeon: Kerin Perna, MD;  Location: [facility] OR;  Service: Open Heart Surgery;  Laterality: N/A;  Times 2 using left internal mammary artery and endoscopically harvested left saphenous vein  . Intraoperative transesophageal echocardiogram N/A 10/08/2013    Procedure: INTRAOPERATIVE TRANSESOPHAGEAL ECHOCARDIOGRAM;  Surgeon: Kerin Perna, MD;  Location: [facility] OR;  Service: Open Heart Surgery;  Laterality: N/A;    Assessment & Plan Clinical Impression: Antonio Bernard is a 79 y.o. male with history of HTN, obesity, OSA, anxiety disorder; who was admitted on 10/05/13 with STEMI. he underwent urgent cardiac catheterization by Dr. Herbie Baltimore that demonstrated a occlusion of the LAD which was opened with PTCA  and IA:PB placed to help flow through LAD. He was allowed to recover from MI and underwent CABG X 1 on 10/08/13 and extubated on 10/13/13. Has required pressors and transient A fib treated with amiodarone. He had decrease in UOP with fluid overload due to decompensated CHF, hypoxia and AKI. Dr. Lorie Phenix consulted for input and recommended increasing diuresis as well as weaning off of dopamine. LE dopplers done due to hypoxia and right peroneal vein DVT noted and patient started on heparin for treatment.  Renal failure slowly improving and urine output improved with improvement in  respiratory status. He has had syncope due to orthostasis and started on midodrine for BP support. Has been refusing CPAP. He continues to have SOB and diuretics being adjusted to help with fluid overload as well as orthostatic symptoms. LifeVest recommended due to ICM--repeat echo with severe hypokinesis and septal and apex akinesis with EF 25%. done. Patient refusing Life Vest due to "claustophobia" but willing to try for a period of time.  Patient transferred to CIR on 10/25/2013 .   Patient currently requires mod with mobility secondary to muscle weakness and decreased cardiorespiratoy endurance and decreased oxygen support.  Prior to hospitalization, patient was independent  with mobility and lived with Alone in a House home.  Home access is 1+1Stairs to enter.  Patient will benefit from skilled PT intervention to maximize safe functional mobility, minimize fall risk and decrease caregiver burden for planned discharge home with 24 hour supervision.  Anticipate patient will benefit from follow up HH at discharge.  PT - End of Session Activity Tolerance: Tolerates 10 - 20 min activity with multiple rests Endurance Deficit: Yes PT Assessment Rehab Potential: Good Barriers to Discharge: Decreased caregiver support PT Patient demonstrates impairments in the following area(s): Balance;Endurance;Motor;Pain;Safety PT Transfers Functional Problem(s): Bed Mobility;Bed to Chair;Car;Furniture PT Locomotion Functional Problem(s): Ambulation;Stairs PT Plan PT Intensity: Minimum of 1-2 x/day ,45 to 90 minutes PT Frequency: 5 out of 7 days PT Duration Estimated Length of Stay: 10-12 days PT Treatment/Interventions: Ambulation/gait training;Balance/vestibular training;Functional mobility training;Pain management;Patient/family education;Stair training;Therapeutic Activities;Therapeutic Exercise;UE/LE Strength taining/ROM PT Transfers Anticipated Outcome(s): S/Mod-Independent PT Locomotion Anticipated  Outcome(s): 150' using LRAD and up/down 2 steps (1 + 1) S/Mod-Independent PT Recommendation Follow Up Recommendations: Home health PT Patient destination: Home (3 daughters to provide initial supervision 24/7)  Skilled Therapeutic Intervention PT Evaluation Precautions/Restrictions Precautions Precautions: Sternal;Fall Restrictions Weight Bearing Restrictions: No General Chart Reviewed: Yes Family/Caregiver Present:  (Daughters Angelique Blonder and Arline Asp present)  Vital SignsTherapy Vitals Temp: 98.3 F (36.8 C) Temp src: Oral Pulse Rate: 85 Resp: 26 (patient says he is feeling short of breath. informed RN) BP: 127/64 mmHg Patient Position, if appropriate: Lying Oxygen Therapy SpO2: 98 % O2 Device: Nasal cannula O2 Flow Rate (L/min): 3 L/min Pain Pain Assessment Pain Assessment: 0-10 Pain Score: 3  Pain Type: Surgical pain Pain Location: Chest Pain Orientation: Mid Pain Descriptors / Indicators: Sore Pain Onset: Gradual Patients Stated Pain Goal: 3 Pain Intervention(s): Medication (See eMAR) Multiple Pain Sites: No Home Living/Prior Functioning Home Living Available Help at Discharge:  (3 daughters will be available to check on him or be there) Type of Home: House Home Access: Stairs to enter Secretary/administrator of Steps: 1+1 Entrance Stairs-Rails: None Home Layout:  (basement has study/library and pool table, laundry on main level.)  Lives With: Alone Prior Function Level of Independence: Independent with basic ADLs;Independent with gait  Able to Take Stairs?: Yes Driving: Yes 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: Hobbies-yes (Comment) Comments:  golf, hunting, fishing Vision/Perception  Vision - History Baseline Vision: Wears glasses all the time Patient Visual Report: No change from baseline  Cognition Overall Cognitive Status: Within Functional Limits for tasks assessed Orientation Level: Oriented X4 Sensation Sensation Light Touch: Appears  Intact (L LE has flet different since back surgery 1 yr ago) Coordination Heel Shin Test: WNL Motor  Motor Motor: Within Functional Limits  Mobility Bed Mobility Bed Mobility: Rolling Left;Left Sidelying to Sit;Sitting - Scoot to [place] of Bed;Sit to Sidelying Left;Scooting to [facility] Rolling Left: 4: Min assist Left Sidelying to Sit: 3: Mod assist;HOB flat Sitting - Scoot to Edge of Bed: 4: Min guard Sit to Sidelying Left: 3: Mod assist;HOB flat Scooting to HOB: 1: +2 Total assist Transfers Transfers: Yes Sit to Stand: 4: Min assist Sit to Stand Details: Tactile cues for placement;Visual cues/gestures for precautions/safety Stand to Sit: 4: Min assist Stand to Sit Details (indicate cue type and reason):  (decreased eccentric control) Stand Pivot Transfers: 4: Min assist Locomotion  Ambulation Ambulation: Yes Ambulation/Gait Assistance: 4: Min assist Ambulation Distance (Feet): 50 Feet (stepping-in-place while monitoring orthostatics due to syncopal episode yesterday) Assistive device: Rolling walker Gait Gait: No (ste-in-place at bedside while monitoring vitals) Stairs / Additional Locomotion Stairs: No Wheelchair Mobility Wheelchair Mobility: No  Trunk/Postural Assessment  Cervical Assessment Cervical Assessment: Within Functional Limits Thoracic Assessment Thoracic Assessment: Within Functional Limits Lumbar Assessment Lumbar Assessment: Exceptions to [facility] Lumbar AROM Overall Lumbar AROM: Due to premorid status Postural Control Postural Control: Deficits on evaluation Postural Limitations:  (kyphotic sitting posture)  Balance Balance Balance Assessed: Yes Static Sitting Balance Static Sitting - Balance Support: No upper extremity supported;Feet supported Static Sitting - Level of Assistance: 5: Stand by assistance Dynamic Sitting Balance Dynamic Sitting - Balance Support: Feet unsupported Dynamic Sitting - Level of Assistance: 5: Stand by assistance Static Standing  Balance Static Standing - Balance Support: Bilateral upper extremity supported Static Standing - Level of Assistance: 4: Min assist Dynamic Standing Balance Dynamic Standing - Balance Support: Bilateral upper extremity supported Dynamic Standing - Level of Assistance: 4: Min assist Extremity Assessment  RUE Assessment RUE Assessment: Exceptions to [facility] RUE AROM (degrees) Overall AROM Right Upper Extremity: Due to precautions LUE Assessment LUE Assessment: Exceptions to [facility] LUE Strength LUE Overall Strength Comments: decreased strength and AROM due to sternal precautions RLE Assessment RLE Assessment: Within Functional Limits LLE Assessment LLE Assessment: Within Functional Limits  FIM:  FIM - Bed/Chair Transfer Bed/Chair Transfer: 3: Supine > Sit: Mod A (lifting assist/Pt. 50-74%/lift 2 legs;3: Sit > Supine: Mod A (lifting assist/Pt. 50-74%/lift 2 legs);4: Bed > Chair or W/C: Min A (steadying Pt. > 75%);4: Chair or W/C > Bed: Min A (steadying Pt. > 75%) FIM - Locomotion: Ambulation Locomotion: Ambulation Assistive Devices: Walker - Rolling Ambulation/Gait Assistance: 4: Min assist Locomotion: Ambulation: 2: Travels 50 - 149 ft with minimal assistance (Pt.>75%) FIM - Locomotion: Stairs Locomotion: Stairs: 0: Activity did not occur   Refer to Care Plan for Long Term Goals  Recommendations for other services: None  Discharge Criteria: Patient will be discharged from PT if patient refuses treatment 3 consecutive times without medical reason, if treatment goals not met, if there is a change in medical status, if patient makes no progress towards goals or if patient is discharged from hospital.  The above assessment, treatment plan, treatment alternatives and goals were discussed and mutually agreed upon: by patient  Rex Kras 10/26/2013, 6:44 PM

## 2013-10-26 NOTE — Evaluation (Signed)
Occupational Therapy Assessment and Plan  Patient Details  Name: Antonio Bernard MRN: [id number] Date of Birth: 13-Oct-1933  OT Diagnosis: muscle weakness (generalized) Rehab Potential: Rehab Potential: Good ELOS:  (10-12 days)   Today's Date: 10/26/2013 Time: 4696-2952  Time Calculation (min): 75 min  1st session             2nd session:  Missed 45 minutes due to fatigue  Problem List:  Patient Active Problem List   Diagnosis Date Noted  . Physical deconditioning 10/25/2013  . Anemia associated with acute blood loss   . Acute systolic heart failure 10/16/2013  . Acute kidney injury 10/16/2013  . S/P CABG x 2: LIMA-LAD, SVG-DIAG 10/10/2013    Class: Chronic  . STEMI of anterior wall,  10/05/13 10/06/2013    Class: Hospitalized for  . Essential hypertension 10/06/2013    Class: Chronic  . Obesity (BMI 35.0-39.9 without comorbidity) 10/06/2013  . S/P urgent LAD PTCA 10/06/13 with IABP for LAD perfusion 10/06/2013  . Anxiety, claustrophobia 10/06/2013  . DJD  lumbar- surg August 2013 10/06/2013  . Sleep apnea- C-pap intol 10/06/2013  . AAA (abdominal aortic aneurysm)-hx of a "small" AAA found July 2013 (nothing in EPIC) 10/06/2013    Past Medical History:  Past Medical History  Diagnosis Date  . Acid reflux   . Family history of anesthesia complication     daughter had problem waking up  . Pneumonia     hx of  . Sleep apnea     no treatment for, last sleep study over 5 years ago  . Abdominal aortic aneurysm     found 06/29/12 "small"  . Headache(784.0)   . Herniated vertebral disc     thoracic area  . Cataracts, bilateral     hx of  . H/O Legionnaire's disease     about 24 years ago  . Anxiety     panic attacks   . Claustrophobia     "severe"  . Hypertension     sees  Dr. Ronne Binning [phone]  . Obesity (BMI 35.0-39.9 without comorbidity) 10/06/2013  . Essential hypertension 10/06/2013  . Anginal pain   . Atrial fibrillation     Postoperative  . Acute  renal insufficiency     Postoperative  . Anemia associated with acute blood loss     Postoperative   Past Surgical History:  Past Surgical History  Procedure Laterality Date  . Shoulder surgery      left  . Cataract extraction w/ intraocular lens implant      bilateral  . Lumbar laminectomy/decompression microdiscectomy  07/20/2012    Procedure: LUMBAR LAMINECTOMY/DECOMPRESSION MICRODISCECTOMY 1 LEVEL;  Surgeon: Antonio Meeker, MD;  Location: [facility];  Service: Neurosurgery;  Laterality: N/A;  lumbar four-five left  . Back surgery    . Cardiac catheterization  10/05/2013  . Eye surgery    . Coronary artery bypass graft N/A 10/08/2013    Procedure: CORONARY ARTERY BYPASS GRAFTING (CABG);  Surgeon: Antonio Perna, MD;  Location: [facility] OR;  Service: Open Heart Surgery;  Laterality: N/A;  Times 2 using left internal mammary artery and endoscopically harvested left saphenous vein  . Intraoperative transesophageal echocardiogram N/A 10/08/2013    Procedure: INTRAOPERATIVE TRANSESOPHAGEAL ECHOCARDIOGRAM;  Surgeon: Antonio Perna, MD;  Location: [facility] OR;  Service: Open Heart Surgery;  Laterality: N/A;    Assessment & Plan Clinical Impression:HPI: Antonio Bernard is a 79 y.o. male with history of HTN, obesity, OSA, anxiety disorder; who was  admitted on 10/05/13 with STEMI. he underwent urgent cardiac catheterization by Antonio Bernard that demonstrated a occlusion of the LAD which was opened with PTCA and IA:PB placed to help flow through LAD. He was allowed to recover from MI and underwent CABG X 1 on 10/08/13 and extubated on 10/13/13. Has required pressors and transient A fib treated with amiodarone. He had decrease in UOP with fluid overload due to decompensated CHF, hypoxia and AKI. Antonio Bernard consulted for input and recommended increasing diuresis as well as weaning off of dopamine. LE dopplers done due to hypoxia and right peroneal vein DVT noted and patient started on heparin for treatment.   Renal failure slowly improving and urine output improved with improvement in respiratory status. He has had syncope due to orthostasis and started on midodrine for BP support. Has been refusing CPAP. He continues to have SOB and diuretics being adjusted to help with fluid overload as well as orthostatic symptoms. LifeVest recommended due to ICM--repeat echo with severe hypokinesis and septal and apex akinesis with EF 25%. done. Patient refusing Life Vest due to "claustophobia" but willing to try for a period of time. Patient deconditioned and CIR recommended by MD and therapy team.     Patient transferred to CIR on 10/25/2013 .    Patient currently requires max with basic self-care skills secondary to decreased cardiorespiratoy endurance.  Prior to hospitalization, patient could complete BADL with independent .  Patient will benefit from skilled intervention to decrease level of assist with basic self-care skills and increase independence with basic self-care skills prior to discharge home with care partner.  Anticipate patient will require intermittent supervision and follow up home health.  OT Assessment Rehab Potential: Good OT Plan OT Intensity: Minimum of 1-2 x/day, 45 to 90 minutes OT Frequency: 5 out of 7 days OT Duration/Estimated Length of Stay:  (10-12 days) OT Treatment/Interventions: Balance/vestibular training;Discharge planning;Disease Conservator, museum/gallery;Functional mobility training;Patient/family education;Self Care/advanced ADL retraining;Therapeutic Activities;Therapeutic Exercise;UE/LE Strength taining/ROM;UE/LE Coordination activities OT Recommendation Patient destination: Home Follow Up Recommendations: Home health OT Equipment Recommended: Tub/shower seat   Skilled Therapeutic Intervention 1st session:  Addressed activity tolerance and endurance during BADL. 2nd session:  Pt unable to participate due to fatique.  Went back hour later  and pt sound asleep.    OT Evaluation Precautions/Restrictions  Precautions Precautions: Sternal;Fall Restrictions Weight Bearing Restrictions: No    Vital Signs  BP: 127/64 mmHg Patient Position, if appropriate:sittingOxygen Therapy SpO2: unable to get reading O2 Device: Nasal cannula O2 Flow Rate (L/min): 3 L/min Pain Pain Assessment Pain Assessment: 0-10 Pain Score: 3  Pain Type: Surgical pain Pain Location: Chest Pain Orientation: Mid Pain Descriptors / Indicators: Sore Pain Onset: Gradual Patients Stated Pain Goal: 3 Pain Intervention(s): Medication (See eMAR) Multiple Pain Sites: No Home Living/Prior Functioning Home Living Available Help at Discharge:  (3 daughters will be available to check on him or be there) Type of Home: House Home Access: Stairs to enter Secretary/administrator of Steps: 1+1 Entrance Stairs-Rails: None Home Layout:  (basement has study/library and pool table, laundry on main level.)  Lives With: Alone Prior Function Level of Independence: Independent with basic ADLs;Independent with gait  Able to Take Stairs?: Yes Driving: Yes 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: Hobbies-yes (Comment) Comments: golf, hunting, fishing ADL ADL Upper Body Bathing: Minimal assistance Where Assessed-Upper Body Bathing: Sitting at sink Lower Body Bathing: Maximal assistance Where Assessed-Lower Body Bathing: Sitting at sink Upper Body Dressing: Minimal assistance Where Assessed-Upper Body Dressing: Edge of  bed Lower Body Dressing: Moderate assistance;Maximal assistance Where Assessed-Lower Body Dressing: Sitting at sink;Standing at sink Vision/Perception  Vision - History Baseline Vision: Wears glasses all the time Patient Visual Report: No change from baseline  Cognition Overall Cognitive Status: Within Functional Limits for tasks assessed Orientation Level: Oriented X4 Sensation Sensation Light Touch: Appears Intact (L LE has flet  different since back surgery 1 yr ago) Coordination Heel Shin Test: WNL Motor  Motor Motor: Within Functional Limits Mobility  Transfers Sit to Stand: 4: Min assist Sit to Stand Details: Tactile cues for placement;Visual cues/gestures for precautions/safety Stand to Sit: 4: Min assist Stand to Sit Details (indicate cue type and reason):  (decreased eccentric control) Trunk/Postural Assessment  Cervical Assessment Cervical Assessment: Within Functional Limits Thoracic Assessment Thoracic Assessment: Within Functional Limits Lumbar Assessment Lumbar Assessment: Exceptions to [facility] Lumbar AROM Overall Lumbar AROM: Due to premorid status Postural Control Postural Control: Deficits on evaluation Postural Limitations:  (sacral sitting)  Balance Static Sitting Balance Static Sitting - Balance Support: No upper extremity supported;Feet supported Static Sitting - Level of Assistance: 5: Stand by assistance Extremity/Trunk Assessment RUE Assessment RUE Assessment: Exceptions to [facility] RUE AROM (degrees) Overall AROM Right Upper Extremity: Due to precautions LUE Assessment LUE Assessment: Exceptions to [facility] LUE Strength LUE Overall Strength Comments: decreased strength and AROM due to sternal precautions       Refer to Care Plan for Long Term Goals  Recommendations for other services: None  Discharge Criteria: Patient will be discharged from OT if patient refuses treatment 3 consecutive times without medical reason, if treatment goals not met, if there is a change in medical status, if patient makes no progress towards goals or if patient is discharged from hospital.  The above assessment, treatment plan, treatment alternatives and goals were discussed and mutually agreed upon: by patient and by family  Humberto Seals 10/26/2013, 6:37 PM

## 2013-10-26 NOTE — Progress Notes (Signed)
Physical Therapy Session Note  Patient Details  Name: Antonio Bernard MRN: [id number] Date of Birth: Sep 23, 1933  Today's Date: 10/26/2013 Time: 1430-1500 Time Calculation (min): 30 min  Therapy Documentation Precautions:  Precautions: Sternal;Fall Precaution Comments: Syncope with PT. Restrictions Weight Bearing Restrictions: No Vital Signs: Therapy Vitals Pulse Rate: 85 Resp: 26 (patient says he is feeling short of breath. informed RN) BP: 127/64 mmHg Patient Position, if appropriate: Lying Oxygen Therapy SpO2: 98 % O2 Device: Nasal cannula O2 Flow Rate (L/min): 3 L/min Pain: denies pain  Therapeutic Activity:(15') Transfer training sit<->stand into RW with min-assist and observing sternal precautions well. Monitoring orthostatic BP/HR/O2 Sats which all remained WNL throughout tx session. Bed mobility sit->Left side lying with Mod-assist, L side lying to supine Independent            and scoot to [facility] Total assist x 2 Gait Training: (15') using RW and stepping-in-place 4 x 50 steps with min-assist and using 3l O2 via [place]. Patient noting fatigue and needing frequent rest breaks. Patient also noting anxiety especially when returning to bed and lying flat (during scoot to [facility]) due to feeling difficulty with             breathing. Nursing was notified and brought Xanax during PT tx session.    See FIM for current functional status  Therapy/Group: Individual Therapy  Rex Kras 10/26/2013, 6:47 PM

## 2013-10-26 NOTE — Progress Notes (Signed)
ANTICOAGULATION CONSULT NOTE - Follow Up Consult  Pharmacy Consult:  Coumadin Indication:  DVT  Allergies  Allergen Reactions  . Demerol [Meperidine] Other (See Comments)    "makes me crazy"  . Lisinopril Diarrhea  . Shellfish Allergy Rash    Patient Measurements: Height: 6\' 1"  (185.4 cm) Weight: 245 lb 6 oz (111.3 kg) IBW/kg (Calculated) : 79.9  Vital Signs: Temp: 98.3 F (36.8 C) (11/22 1446) Temp src: Oral (11/22 1446) BP: 135/71 mmHg (11/22 1446) Pulse Rate: 83 (11/22 1446)  Labs:  Recent Labs  10/24/13 0425 10/25/13 0450 10/26/13 0415  HGB 9.1* 9.2* 8.9*  HCT 28.0* 28.3* 26.7*  PLT 352 363 356  LABPROT 17.0* 19.2* 22.9*  INR 1.42 1.67* 2.10*  CREATININE 1.58* 1.64* 1.68*    Estimated Creatinine Clearance: 48.2 ml/min (by C-G formula based on Cr of 1.68).      Assessment: 79 YOM on Coumadin for DVT.  INR therapeutic today; no bleeding reported.   Goal of Therapy:  INR 2-3 Monitor platelets by anticoagulation protocol: Yes    Plan:  - Change Coumadin to 5mg  PO today - Daily PT / INR - F/U ACEi/ARB when able    Madinah Quarry D. Laney Potash, PharmD, BCPS Pager:  [phone] 10/26/2013, 3:03 PM

## 2013-10-26 NOTE — Progress Notes (Signed)
Patient ID: [name], male   DOB: Sep 28, 1933, 79 y.o.   MRN: [id number] DONNAVIN VANDENBRINK is a 79 y.o. male November 21, 1933 [id number]  Subjective: Reviewed anxiety and problems last PM. Feels ok now and denies new problems. breathing OK. Pain controlled.   Objective: Vital signs in last 24 hours: Temp:  [97 F (36.1 C)-97.1 F (36.2 C)] 97.1 F (36.2 C) (11/22 0504) Pulse Rate:  [82-85] 82 (11/22 0504) Resp:  [18-20] 19 (11/22 0504) BP: (112-121)/(64-73) 115/64 mmHg (11/22 0504) SpO2:  [95 %-99 %] 95 % (11/22 0504) Weight:  [111.3 kg (245 lb 6 oz)-113.082 kg (249 lb 4.8 oz)] 111.3 kg (245 lb 6 oz) (11/22 0504) Weight change:  Last BM Date: 10/18/13  Intake/Output from previous day: 11/21 0701 - 11/22 0700 In: -  Out: 600 [Urine:600]  Physical Exam General: No apparent distress  -Family at side  Lungs: Normal effort. Lungs clear to auscultation, no crackles or wheezes. Cardiovascular: Regular rate and rhythm, tr BLE edema Musculoskeletal:  Neurovascularly intact Neurological: No new neurological deficits Wounds: incision sites clean, dry, intact. No signs of infection.  Lab Results: BMET    Component Value Date/Time   NA 131* 10/26/2013 0415   K 3.8 10/26/2013 0415   CL 88* 10/26/2013 0415   CO2 34* 10/26/2013 0415   GLUCOSE 100* 10/26/2013 0415   BUN 44* 10/26/2013 0415   CREATININE 1.68* 10/26/2013 0415   CALCIUM 8.5 10/26/2013 0415   GFRNONAA 38* 10/26/2013 0415   GFRAA 44* 10/26/2013 0415   CBC    Component Value Date/Time   WBC 10.6* 10/26/2013 0415   RBC 2.92* 10/26/2013 0415   HGB 8.9* 10/26/2013 0415   HCT 26.7* 10/26/2013 0415   PLT 356 10/26/2013 0415   MCV 91.4 10/26/2013 0415   MCH 30.5 10/26/2013 0415   MCHC 33.3 10/26/2013 0415   RDW 14.2 10/26/2013 0415   LYMPHSABS 2.5 10/05/2013 2300   MONOABS 0.8 10/05/2013 2300   EOSABS 0.2 10/05/2013 2300   BASOSABS 0.0 10/05/2013 2300   CBG's (last 3):   Recent Labs  10/25/13 1108  10/25/13 2151 10/26/13 0753  GLUCAP 105* 93 89   LFT's Lab Results  Component Value Date   ALT 54* 10/22/2013   AST 52* 10/22/2013   ALKPHOS 80 10/22/2013   BILITOT 1.1 10/22/2013    Studies/Results: No results found.  Medications:  I have reviewed the patient's current medications. Scheduled Medications: . ALPRAZolam  0.5-0.75 mg Oral QHS  . alteplase  2 mg Intracatheter Once  . alteplase  2 mg Intracatheter Once  . aspirin EC  81 mg Oral Daily  . atorvastatin  80 mg Oral q1800  . bisacodyl  10 mg Oral Daily   Or  . bisacodyl  10 mg Rectal Daily  . budesonide (PULMICORT) nebulizer solution  0.25 mg Nebulization BID  . carvedilol  3.125 mg Oral BID WC  . docusate sodium  200 mg Oral Daily  . feeding supplement (GLUCERNA SHAKE)  237 mL Oral TID BM  . insulin aspart  0-15 Units Subcutaneous TID WC  . levalbuterol  1.25 mg Nebulization BID  . pantoprazole  80 mg Oral Q1200  . potassium chloride  20 mEq Oral Daily  . spironolactone  25 mg Oral Daily  . warfarin  7.5 mg Oral q1800  . Warfarin - Pharmacist Dosing Inpatient   Does not apply q1800   PRN Medications: ALPRAZolam, alum & mag hydroxide-simeth, guaiFENesin-dextromethorphan, levalbuterol, ondansetron (ZOFRAN) IV, ondansetron, oxyCODONE, sodium chloride,  traMADol  Assessment/Plan: Principal Problem:   Physical deconditioning Active Problems:   Obesity (BMI 35.0-39.9 without comorbidity)   Anxiety, claustrophobia   Sleep apnea- C-pap intol   Acute systolic heart failure   Acute kidney injury   Anemia associated with acute blood loss  1. R peroneal DVT: Pharmaceutical: Coumadin  2. Pain Management: Will continue tramadol or oxycodone prn--not using any at this time. Will monitor for now.  3. Anxiety disorder with panic attacks/Mood: Continue Xanax as scheduled PTA: give prn during the day and 0.5 to.75mg  at bedtime.  4. Neuropsych: This patient is capable of making decisions on his own behalf.  5.  Hypotension: resolved. Off midodrine. Monitor for tolerance with increase in activity.  6. A fib: resolved and stable- off amiodarone.  7. OSA: Non compliant/intolerant of CPAP--continue to encourage use. Hypoxia resolved but back on oxygen now.  8. Acute on chronic systolic CHF: Modified diet. Check weights daily. Continue lasix and corvedilol--Heart failure team to assist with management.  9. ICM: Fitted with life vest 11/21  10. Acute kidney injury: Resolving. Continue to monitor lytes and urine output.  11. ABLA: Baseline Hgb 13.9 but low iron stores noted.--will supplement. Has had slow but steady downward trend in H/H. Monitor for signs of bleeding with coumadin on board. monitor stool guaiacs. Check H/H twice a week.  12. Abnormal LFTs: mild, no GI symptoms -likely medication related.  13. Stress induced hyperglycemia: Baseline Hgb A1c --5.5. Need to keep BS controlled with recent CABG to promote healing. Will continue ac BS checks and use SSI for elevated BS.   Length of stay, days: 1    Joniece Smotherman A. Felicity Coyer, MD 10/26/2013, 9:22 AM

## 2013-10-26 NOTE — Progress Notes (Signed)
Patient woke up around 2am and used the urinal and voided 250cc and started sweating profusely. Daughter at bedside. Patient wanted to void some more, so NT and RN helped patient dangle on the side of the bed to try to void with the urinal, but was unable. Patient then stated feeling dizzy while dangling. Patient started to become anxious and stating he could not breath, patient currently on 3L of O2 via [place]. Pt drank some orange juice and agreed to lay back down in the bed. SpO2 remained at 94% and bladder scan was 0. Patient was still feeling anxious and requested something to help go back to sleep; RN administered 0.25mg  of Xanax and and 10mg  of Oxycodone, continued 3L [place]. Daughter still at bedside. At this time, daughter pointed out L leg/foot being cold compare to R, but dorsal pulses are present on both. Will leave a note for MD to check in the AM. Patient now able to settle down and relax. Re-checked on patient at 2:45am and patient and daughter were both asleep. RN will continue to monitor and will pass onto report to AM RN.

## 2013-10-27 ENCOUNTER — Inpatient Hospital Stay (HOSPITAL_COMMUNITY): Payer: Medicare Other | Admitting: Physical Therapy

## 2013-10-27 ENCOUNTER — Inpatient Hospital Stay (HOSPITAL_COMMUNITY): Payer: Medicare Other | Admitting: *Deleted

## 2013-10-27 ENCOUNTER — Inpatient Hospital Stay (HOSPITAL_COMMUNITY): Payer: Medicare Other

## 2013-10-27 DIAGNOSIS — I10 Essential (primary) hypertension: Secondary | ICD-10-CM

## 2013-10-27 LAB — BASIC METABOLIC PANEL
CO2: 35 mEq/L — ABNORMAL HIGH (ref 19–32)
CO2: 32 mEq/L (ref 19–32)
Calcium: 8.5 mg/dL (ref 8.4–10.5)
Calcium: 8.4 mg/dL (ref 8.4–10.5)
Chloride: 87 mEq/L — ABNORMAL LOW (ref 96–112)
Chloride: 85 mEq/L — ABNORMAL LOW (ref 96–112)
GFR calc Af Amer: 45 mL/min — ABNORMAL LOW (ref >90)
Glucose, Bld: 81 mg/dL (ref 70–99)
Glucose, Bld: 114 mg/dL — ABNORMAL HIGH (ref 70–99)
Potassium: 3.6 mEq/L (ref 3.5–5.1)
Sodium: 131 mEq/L — ABNORMAL LOW (ref 135–145)
Sodium: 127 mEq/L — ABNORMAL LOW (ref 135–145)

## 2013-10-27 LAB — CBC
HCT: 26.9 % — ABNORMAL LOW (ref 39.0–52.0)
Hemoglobin: 8.7 g/dL — ABNORMAL LOW (ref 13.0–17.0)
MCV: 91.8 fL (ref 78.0–100.0)
RBC: 2.93 MIL/uL — ABNORMAL LOW (ref 4.22–5.81)
RDW: 14.4 % (ref 11.5–15.5)
WBC: 9.7 10*3/uL (ref 4.0–10.5)

## 2013-10-27 LAB — GLUCOSE, CAPILLARY: Glucose-Capillary: 89 mg/dL (ref 70–99)

## 2013-10-27 LAB — PROTIME-INR
INR: 2.73 — ABNORMAL HIGH (ref 0.00–1.49)
Prothrombin Time: 28 seconds — ABNORMAL HIGH (ref 11.6–15.2)

## 2013-10-27 LAB — TROPONIN I: Troponin I: <0.3 ng/mL (ref <0.30)

## 2013-10-27 MED ORDER — WARFARIN SODIUM 2.5 MG PO TABS
2.5000 mg | ORAL_TABLET | Freq: Once | ORAL | Status: DC
  Filled 2013-10-27: qty 1

## 2013-10-27 MED ORDER — WARFARIN SODIUM 4 MG PO TABS
4.0000 mg | ORAL_TABLET | Freq: Once | ORAL | Status: AC
  Administered 2013-10-27: 4 mg via ORAL
  Filled 2013-10-27: qty 1

## 2013-10-27 NOTE — Progress Notes (Signed)
Physical Therapy Note  Patient Details  Name: Antonio Bernard MRN: [id number] Date of Birth: 21-Jul-1933 Today's Date: 10/27/2013  Time: 900-955 55 minutes  1:1 No c/o pain.  Gait in controlled environment with close supervision-min A, pt able to gait 75' before requiring seated rest due to fatigue.  Pt able to gait 75' x 4 throughout session.  Stair negotiation with 2 handrails with supervision, pt/daughter state they can have railings installed at home.  Standing therex 2 x 10 B LEs hip abd, flex, ext, HS curl, heel raises.  Pt requires frequent rest breaks, cues for deep breathing. spO2 94% on 3L [place] during session.   [name] 10/27/2013, 9:55 AM

## 2013-10-27 NOTE — Progress Notes (Signed)
ANTICOAGULATION CONSULT NOTE - Follow Up Consult  Pharmacy Consult:  Coumadin Indication:  DVT  Allergies  Allergen Reactions  . Demerol [Meperidine] Other (See Comments)    "makes me crazy"  . Lisinopril Diarrhea  . Shellfish Allergy Rash    Patient Measurements: Height: 6\' 1"  (185.4 cm) Weight: 248 lb 10.9 oz (112.8 kg) IBW/kg (Calculated) : 79.9  Vital Signs: Temp: 98.1 F (36.7 C) (11/23 0455) Temp src: Oral (11/23 0455) BP: 126/77 mmHg (11/23 0455) Pulse Rate: 81 (11/23 0455)  Labs:  Recent Labs  10/25/13 0450 10/26/13 0415 10/27/13 0500  HGB 9.2* 8.9* 8.7*  HCT 28.3* 26.7* 26.9*  PLT 363 356 404*  LABPROT 19.2* 22.9* 28.0*  INR 1.67* 2.10* 2.73*  CREATININE 1.64* 1.68* 1.43*    Estimated Creatinine Clearance: 57 ml/min (by C-G formula based on Cr of 1.43).      Assessment: 79 YOM on Coumadin for DVT.  INR therapeutic today but has increased significantly; no bleeding reported.   Goal of Therapy:  INR 2-3 Monitor platelets by anticoagulation protocol: Yes    Plan:  - Coumadin 4mg  PO today - Daily PT / INR - F/U ACEi/ARB when able    Callyn Severtson D. Laney Potash, PharmD, BCPS Pager:  [phone] 10/27/2013, 11:38 AM

## 2013-10-27 NOTE — Code Documentation (Signed)
CODE BLUE NOTE  Patient Name: Antonio Bernard   MRN: [id number]   Date of Birth/ Sex: 1933-09-29 , male      Admission Date: 10/25/2013  Attending Provider: Erick Colace, MD  Primary Diagnosis: Physical deconditioning    Indication: Pt was in his usual state of health until this PM, when he was noted to be unresponsive. Code blue was subsequently called. At the time of arrival on scene, patient had become responsive with stimulation and no CPR had been initiated.    Technical Description:  - CPR performance duration:  None    - Was defibrillation or cardioversion used? No   - Was external pacer placed? No  - Was patient intubated pre/post CPR? No    Medications Administered: Y = Yes; Blank = No Amiodarone    Atropine    Calcium    Epinephrine    Lidocaine    Magnesium    Norepinephrine    Phenylephrine    Sodium bicarbonate    Vasopressin      Post CPR evaluation:  - Final Status - Was patient successfully resuscitated ? Yes - What is current rhythm? NSR - What is current hemodynamic status? stable   Miscellaneous Information:  - Labs sent, including: BMET, Trop, CXR, EKG  - Primary team notified?  Yes  - Family Notified? Yes  - Additional notes/ transfer status: Patient stable and not requiring ACLS. Will defer continued treatment and follow-up of labs and imaging to primary team.    Wenda Low, MD  10/27/2013, 3:11 PM

## 2013-10-27 NOTE — Progress Notes (Signed)
Patient ID: Antonio Bernard, male   DOB: September 09, 1933, 79 y.o.   MRN: [id number] Antonio Bernard is a 79 y.o. male 27-Jun-1933 [id number]  Subjective: Dtr requests change warfarin to "new med without as much need for monitoring". She is also concerned about why pt on O2. Pt reports he feels ok, denies problems. breathing OK. Pain controlled.   Objective: Vital signs in last 24 hours: Temp:  [98.1 F (36.7 C)-98.3 F (36.8 C)] 98.1 F (36.7 C) (11/23 0455) Pulse Rate:  [81-85] 81 (11/23 0455) Resp:  [19-26] 19 (11/23 0455) BP: (126-135)/(64-77) 126/77 mmHg (11/23 0455) SpO2:  [97 %-100 %] 97 % (11/23 0455) Weight:  [112.8 kg (248 lb 10.9 oz)] 112.8 kg (248 lb 10.9 oz) (11/23 0455) Weight change: -0.282 kg (-9.9 oz) Last BM Date: 10/26/13  Intake/Output from previous day: 11/22 0701 - 11/23 0700 In: 480 [P.O.:480] Out: 550 [Urine:550]  Physical Exam General: No apparent distress  -daughter at side  Lungs: Normal effort. Lungs clear to auscultation, no crackles or wheezes. Cardiovascular: Regular rate and rhythm, tr BLE edema Musculoskeletal:  Neurovascularly intact Neurological: No new neurological deficits Wounds: incision sites clean, dry, intact. No signs of infection.  Lab Results: BMET    Component Value Date/Time   NA 131* 10/27/2013 0500   K 3.6 10/27/2013 0500   CL 87* 10/27/2013 0500   CO2 35* 10/27/2013 0500   GLUCOSE 81 10/27/2013 0500   BUN 47* 10/27/2013 0500   CREATININE 1.43* 10/27/2013 0500   CALCIUM 8.5 10/27/2013 0500   GFRNONAA 46* 10/27/2013 0500   GFRAA 53* 10/27/2013 0500   CBC    Component Value Date/Time   WBC 9.7 10/27/2013 0500   RBC 2.93* 10/27/2013 0500   HGB 8.7* 10/27/2013 0500   HCT 26.9* 10/27/2013 0500   PLT 404* 10/27/2013 0500   MCV 91.8 10/27/2013 0500   MCH 29.7 10/27/2013 0500   MCHC 32.3 10/27/2013 0500   RDW 14.4 10/27/2013 0500   LYMPHSABS 2.5 10/05/2013 2300   MONOABS 0.8 10/05/2013 2300   EOSABS 0.2 10/05/2013  2300   BASOSABS 0.0 10/05/2013 2300   CBG's (last 3):    Recent Labs  10/26/13 1651 10/26/13 2045 10/27/13 0751  GLUCAP 98 97 89   LFT's Lab Results  Component Value Date   ALT 54* 10/22/2013   AST 52* 10/22/2013   ALKPHOS 80 10/22/2013   BILITOT 1.1 10/22/2013    Studies/Results: No results found.  Medications:  I have reviewed the patient's current medications. Scheduled Medications: . ALPRAZolam  0.5-0.75 mg Oral QHS  . aspirin EC  81 mg Oral Daily  . atorvastatin  80 mg Oral q1800  . bisacodyl  10 mg Oral Daily   Or  . bisacodyl  10 mg Rectal Daily  . budesonide (PULMICORT) nebulizer solution  0.25 mg Nebulization BID  . carvedilol  3.125 mg Oral BID WC  . docusate sodium  200 mg Oral Daily  . feeding supplement (GLUCERNA SHAKE)  237 mL Oral TID BM  . insulin aspart  0-15 Units Subcutaneous TID WC  . levalbuterol  1.25 mg Nebulization BID  . pantoprazole  80 mg Oral Q1200  . potassium chloride  20 mEq Oral Daily  . spironolactone  25 mg Oral Daily  . Warfarin - Pharmacist Dosing Inpatient   Does not apply q1800   PRN Medications: ALPRAZolam, alum & mag hydroxide-simeth, guaiFENesin-dextromethorphan, levalbuterol, ondansetron (ZOFRAN) IV, ondansetron, oxyCODONE, sodium chloride, traMADol  Assessment/Plan: Principal Problem:   Physical deconditioning  Active Problems:   Obesity (BMI 35.0-39.9 without comorbidity)   Anxiety, claustrophobia   Sleep apnea- C-pap intol   Acute systolic heart failure   Acute kidney injury   Anemia associated with acute blood loss  1. R peroneal DVT: Pharmaceutical: Coumadin - though family requests consideration of ?newer anticoag for "less monitoring" - will defer to cards and regular team to address as needed 2. Pain Management: Will continue tramadol or oxycodone prn--not using any at this time. Will monitor for now.  3. Anxiety disorder with panic attacks/Mood: Continue Xanax as scheduled PTA: give prn during the day and  0.5 to.75mg  at bedtime.  4. Neuropsych: This patient is capable of making decisions on his own behalf.  5. Hypotension: resolved. Off midodrine. Monitor for tolerance with increase in activity.  6. A fib: resolved and stable- off amiodarone. See above DVT re: ?change warfarin to alt anticoag agent 7. OSA: Non compliant/intolerant of CPAP--continue to encourage use. Hypoxia resolved but back on oxygen now (suspect benefit for anxiety but will DC as tolerated) 8. Acute on chronic systolic CHF: Modified diet. Check weights daily. Continue lasix and corvedilol--Heart failure team to assist with management.  9. ICM: Fitted with life vest 11/21  10. Acute kidney injury: Resolving. Continue to monitor lytes and urine output.  11. ABLA: Baseline Hgb 13.9 but low iron stores noted.--will supplement. Has had slow but steady downward trend in H/H. Monitor for signs of bleeding with coumadin on board. monitor stool guaiacs. Check H/H twice a week.  12. Abnormal LFTs: mild, no GI symptoms -likely medication related.  13. Stress induced hyperglycemia: Baseline Hgb A1c --5.5. Need to keep BS controlled with recent CABG to promote healing. Will continue ac BS checks and use SSI for elevated BS.   Length of stay, days: 2    Valerie A. Felicity Coyer, MD 10/27/2013, 9:59 AM

## 2013-10-27 NOTE — Progress Notes (Signed)
Occupational Therapy Note   Patient Details  Name: Antonio Bernard MRN: [id number] Date of Birth: 06/12/1933 Today's Date: 10/27/2013 Time:  1000-1100  (50 min) Pain: none Individual session    1st session:  Engaged in therapeutic bathing and dressing at shower level.  Pt. Ambulated with RW to shower with minimal assist.  Addressed shower transfer (min assist), standing balance, endurance.  Pt stood to bath peri area (anterior part) and needed assist with posterior due to sternal precautions.  Pt needs AE for bathing, dressing, toileting.  Pt. Ambulated with RW to recliner and completed dressing.  Pt kept oxygen on 3 liters entire time.    Time:  1330-1400  (30 min) Pain:  3/10 mild chest pain Individual session   2nd session:  Engaged in functional mobility using RW.  Ambulated to various stations in room.  Had one rest break.  Oxygen= 93%, HR= 81 HR.  Pt. On 3 liters of oxygen.  Pt ambulated 2 more times around room.  Verbalized 2/4 sternal precautions. Left pt in wc with  call bell,phone within reach.   Daughter, Antonio Bernard present.          Humberto Seals 10/27/2013, 11:03 AM

## 2013-10-27 NOTE — Progress Notes (Signed)
Please provide two caregivers when "boosting" patient in bed.  Patient is prone to syncopal episodes.  (See Care Plan in patient room).

## 2013-10-27 NOTE — Progress Notes (Signed)
Physical Therapy Note  Patient Details  Name: Antonio Bernard MRN: [id number] Date of Birth: Feb 04, 1933 Today's Date: 10/27/2013  1515 Pt missed 45 minute PT session secondary to syncopal episode per nursing.    Fidel Caggiano,JIM 10/27/2013, 3:19 PM

## 2013-10-27 NOTE — Progress Notes (Addendum)
1452 RN called to patient room for patient unresponsiveness and team called Code Blue. Life Vest intact and on; no electrical charge fired per [employer].  1455: 123/65, 52HR, 20R, Patient 100% on 5L [place].  1457 CCM treatment team arrives to room and patient regains consciousness and is AOX3,  per CCM patient had a syncopal episode, but will continue to monitor appropriately.  See Order Management for diagnostic testing. Family aware of episode and at bedside.  1500 VSS; see doc flow sheets for further results.  Nursing staff continuing to be monitor.  1515  Dr. Felicity Coyer notified of patient's episode.  Aware of CCM involvement at patient's bedside.

## 2013-10-28 ENCOUNTER — Encounter (HOSPITAL_COMMUNITY): Payer: Medicare Other | Admitting: Occupational Therapy

## 2013-10-28 ENCOUNTER — Inpatient Hospital Stay (HOSPITAL_COMMUNITY): Payer: Medicare Other | Admitting: Rehabilitation

## 2013-10-28 ENCOUNTER — Encounter (HOSPITAL_COMMUNITY): Payer: Medicare Other

## 2013-10-28 ENCOUNTER — Inpatient Hospital Stay (HOSPITAL_COMMUNITY): Payer: Medicare Other | Admitting: Occupational Therapy

## 2013-10-28 ENCOUNTER — Inpatient Hospital Stay (HOSPITAL_COMMUNITY): Payer: Medicare Other | Admitting: Physical Therapy

## 2013-10-28 DIAGNOSIS — E871 Hypo-osmolality and hyponatremia: Secondary | ICD-10-CM | POA: Diagnosis present

## 2013-10-28 DIAGNOSIS — R5381 Other malaise: Secondary | ICD-10-CM

## 2013-10-28 DIAGNOSIS — I509 Heart failure, unspecified: Secondary | ICD-10-CM

## 2013-10-28 DIAGNOSIS — R55 Syncope and collapse: Secondary | ICD-10-CM

## 2013-10-28 DIAGNOSIS — I5021 Acute systolic (congestive) heart failure: Secondary | ICD-10-CM

## 2013-10-28 LAB — CBC
HCT: 26.6 % — ABNORMAL LOW (ref 39.0–52.0)
Hemoglobin: 8.5 g/dL — ABNORMAL LOW (ref 13.0–17.0)
MCH: 29.3 pg (ref 26.0–34.0)
MCHC: 32 g/dL (ref 30.0–36.0)
MCV: 91.7 fL (ref 78.0–100.0)
RDW: 14.5 % (ref 11.5–15.5)
WBC: 9.3 10*3/uL (ref 4.0–10.5)

## 2013-10-28 LAB — GLUCOSE, CAPILLARY: Glucose-Capillary: 125 mg/dL — ABNORMAL HIGH (ref 70–99)

## 2013-10-28 LAB — BASIC METABOLIC PANEL
BUN: 54 mg/dL — ABNORMAL HIGH (ref 6–23)
CO2: 33 mEq/L — ABNORMAL HIGH (ref 19–32)
Chloride: 86 mEq/L — ABNORMAL LOW (ref 96–112)
Creatinine, Ser: 1.63 mg/dL — ABNORMAL HIGH (ref 0.50–1.35)

## 2013-10-28 MED ORDER — PRO-STAT SUGAR FREE PO LIQD
30.0000 mL | Freq: Three times a day (TID) | ORAL | Status: DC
  Administered 2013-10-28 – 2013-10-31 (×8): 30 mL via ORAL
  Filled 2013-10-28 (×11): qty 30

## 2013-10-28 MED ORDER — FUROSEMIDE 40 MG PO TABS
40.0000 mg | ORAL_TABLET | Freq: Every day | ORAL | Status: DC
  Administered 2013-10-28 – 2013-10-31 (×4): 40 mg via ORAL
  Filled 2013-10-28 (×5): qty 1

## 2013-10-28 MED ORDER — SACCHAROMYCES BOULARDII 250 MG PO CAPS
250.0000 mg | ORAL_CAPSULE | Freq: Two times a day (BID) | ORAL | Status: DC
  Administered 2013-10-28 – 2013-10-31 (×6): 250 mg via ORAL
  Filled 2013-10-28 (×8): qty 1

## 2013-10-28 MED ORDER — BOOST PLUS PO LIQD
237.0000 mL | Freq: Three times a day (TID) | ORAL | Status: DC
  Administered 2013-10-30: 237 mL via ORAL
  Filled 2013-10-28 (×9): qty 237

## 2013-10-28 MED FILL — Medication: Qty: 1 | Status: AC

## 2013-10-28 NOTE — Progress Notes (Signed)
Occupational Therapy Session Note  Patient Details  Name: Antonio Bernard MRN: [id number] Date of Birth: [birth year]  Today's Date: 10/28/2013 Time: 1102-1202 Time Calculation (min): 60 min  Session 2 Time:  14:16-15:00 Time Calcualtion (min):  44 mins  Short Term Goals: Week 1:   LTGs set at supervision to modified independent  Skilled Therapeutic Interventions/Progress Updates:    Session 1: Pt performed bathing and dressing at shower level this session.  O2 sats initially at 97% on 3Ls with HR at 80 BPM.  When checked again after standing in the shower without oxygen sats were at 90% on room air.  Pt was able to transfer with use of the RW to the shower and to the toilet with min guard assist.  Unable to reach either lower leg for bathing secondary to flexibility issues.  Will try LH sponge next visit.  Pt fatigues easily with standing tasks or bathing tasks so needs increased rest breaks.  Dressing sit to stand with min guard assist as well.  Pt with slight difficulty starting incontinent brief over right foot during session.  Therapist assisted with donning TEDs and gripper socks as pt cannot reach his feet.  Will demonstrate and practice with AE next session.  Pt able to perform toilet hygiene with sitting on toilet this session as well.  Session 2:  Pt and pt's daughter educated on the need and use of AE for LB selfcare tasks this session.  Pt practiced with use of the reacher and the sockaide to donn and doff his gripper socks.  He was able to perform with increased time, supervision, and mod instructional cueing.  Attempted to use LH shoe horn for donning slip on shoes, however pt with increased swelling in his feet and not able to get them to fit at this time.  Finished session by practicing toilet transfer as pt felt like he needed to go.  Pt used the RW and min assist to ambulate to and from the elevated toilet with the rail.  O2 sats after returning from the bathroom were 93% on  3Ls.  Pt reporting feeling dizzy after returning to the wheelchair.  Therapist tilted the wheelchair back for a minute and pt recovered.  No BP taken after pt reported feeling better. Therapy Documentation Precautions:  Precautions Precautions: Sternal;Fall Precaution Comments: Syncope with PT. Restrictions Weight Bearing Restrictions: No  Pain:  No report of pain  ADL: ADL Upper Body Bathing: Minimal assistance Where Assessed-Upper Body Bathing: Sitting at sink Lower Body Bathing: Maximal assistance Where Assessed-Lower Body Bathing: Sitting at sink Upper Body Dressing: Minimal assistance Where Assessed-Upper Body Dressing: Edge of bed Lower Body Dressing: Moderate assistance;Maximal assistance Where Assessed-Lower Body Dressing: Sitting at sink;Standing at sink  See FIM for current functional status  Therapy/Group: Individual Therapy  Alisia Vanengen OTR/L 10/28/2013, 3:50 PM

## 2013-10-28 NOTE — Care Management Note (Signed)
[facility] Individual Statement of Services  Patient Name:  Antonio Bernard  Date:  10/28/2013  Welcome to the [facility].  Our goal is to provide you with an individualized program based on your diagnosis and situation, designed to meet your specific needs.  With this comprehensive rehabilitation program, you will be expected to participate in at least 3 hours of rehabilitation therapies Monday-Friday, with modified therapy programming on the weekends.  Your rehabilitation program will include the following services:  Physical Therapy (PT), Occupational Therapy (OT), 24 hour per day rehabilitation nursing, Neuropsychology, Case Management (Social Worker), Rehabilitation Medicine, Nutrition Services and Pharmacy Services  Weekly team conferences will be held on Wednesday to discuss your progress.  Your Social Worker will talk with you frequently to get your input and to update you on team discussions.  Team conferences with you and your family in attendance may also be held.  Expected length of stay: 10-12 days Overall anticipated outcome: Supervision-set-up  Depending on your progress and recovery, your program may change. Your Social Worker will coordinate services and will keep you informed of any changes. Your Social Worker's name and contact numbers are listed  below.  The following services may also be recommended but are not provided by the [facility]:   Driving Evaluations  Home Health Rehabiltiation Services  Outpatient Rehabilitation Services  Vocational Rehabilitation   Arrangements will be made to provide these services after discharge if needed.  Arrangements include referral to agencies that provide these services.  Your insurance has been verified to be:  Medicare & [employer] Your primary doctor is:  Thayer Headings  Pertinent information will be shared with your doctor and your insurance company.  Social  Worker:  Dossie Der, SW [phone] or ([phone]  Information discussed with and copy given to patient by: Lucy Chris, 10/28/2013, 10:25 AM

## 2013-10-28 NOTE — Plan of Care (Signed)
Problem: RH SAFETY Goal: RH STG ADHERE TO SAFETY PRECAUTIONS W/ASSISTANCE/DEVICE STG Adhere to Safety Precautions With Assistance/Device. Supervision Outcome: Progressing Calls appropriately for assistance     

## 2013-10-28 NOTE — Progress Notes (Signed)
Patient information reviewed and entered into eRehab system by Andrya Roppolo, RN, CRRN, PPS Coordinator.  Information including medical coding and functional independence measure will be reviewed and updated through discharge.     Per nursing patient was given "Data Collection Information Summary for Patients in Inpatient Rehabilitation Facilities with attached "Privacy Act Statement-Health Care Records" upon admission.  

## 2013-10-28 NOTE — Progress Notes (Signed)
Subjective/Complaints: 79 y.o. male with history of HTN, obesity, OSA, anxiety disorder; who was admitted on 10/05/13 with STEMI. he underwent urgent cardiac catheterization by Dr. Herbie [place] that demonstrated a occlusion of the LAD which was opened with PTCA and IA:PB placed to help flow through LAD. He was allowed to recover from MI and underwent CABG X 1 on 10/08/13 and extubated on 10/13/13. Has required pressors and transient A fib treated with amiodarone. He had decrease in UOP with fluid overload due to decompensated CHF, hypoxia and AKI. Dr. Lorie Phenix consulted for input and recommended increasing diuresis as well as weaning off of dopamine. LE dopplers done due to hypoxia and right peroneal vein DVT noted and patient started on heparin for treatment.  Renal failure slowly improving and urine output improved with improvement in respiratory status. He has had syncope due to orthostasis and started on midodrine for BP support. Has been refusing CPAP. He continues to have SOB and diuretics being adjusted to help with fluid overload as well as orthostatic symptoms. LifeVest recommended due to ICM--repeat echo with severe hypokinesis and septal and apex akinesis with EF 25%. done. Patient refusing Life Vest due to "claustophobia" but willing to try for a period of time. Patient deconditioned and CIR recommended by MD and therapy team.    Syncopal episode yesterday. No cardiac event. Discussed with cardiology this morning. History of orthostatic events. Please see cardiac note  Objective: Vital Signs: Blood pressure 127/80, pulse 80, temperature 97.6 F (36.4 C), temperature source Oral, resp. rate 18, height 6\' 1"  (1.854 m), weight 110.587 kg (243 lb 12.8 oz), SpO2 97.00%. Dg Chest Port 1 View  10/27/2013   CLINICAL DATA:  Short of breath  EXAM: PORTABLE CHEST - 1 VIEW  COMPARISON:  10/23/2013  FINDINGS: Progress of bilateral airspace disease consistent with pulmonary edema. Increase in bilateral  effusion and bibasilar atelectasis.  Right arm PICC tip in the SVC unchanged.  IMPRESSION: Progressive heart failure with edema and bilateral pleural effusions.   Electronically Signed   By: Marlan Palau M.D.   On: 10/27/2013 16:43   Results for orders placed during the hospital encounter of 10/25/13 (from the past 72 hour(s))  GLUCOSE, CAPILLARY     Status: None   Collection Time    10/25/13  9:51 PM      Result Value Range   Glucose-Capillary 93  70 - 99 mg/dL  BASIC METABOLIC PANEL     Status: Abnormal   Collection Time    10/26/13  4:15 AM      Result Value Range   Sodium 131 (*) 135 - 145 mEq/L   Potassium 3.8  3.5 - 5.1 mEq/L   Chloride 88 (*) 96 - 112 mEq/L   CO2 34 (*) 19 - 32 mEq/L   Glucose, Bld 100 (*) 70 - 99 mg/dL   BUN 44 (*) 6 - 23 mg/dL   Creatinine, Ser 1.61 (*) 0.50 - 1.35 mg/dL   Calcium 8.5  8.4 - 09.6 mg/dL   GFR calc non Af Amer 38 (*) >90 mL/min   GFR calc Af Amer 44 (*) >90 mL/min   Comment: (NOTE)     The eGFR has been calculated using the CKD EPI equation.     This calculation has not been validated in all clinical situations.     eGFR's persistently <90 mL/min signify possible Chronic Kidney     Disease.  CBC     Status: Abnormal   Collection Time    10/26/13  4:15 AM      Result Value Range   WBC 10.6 (*) 4.0 - 10.5 K/uL   RBC 2.92 (*) 4.22 - 5.81 MIL/uL   Hemoglobin 8.9 (*) 13.0 - 17.0 g/dL   HCT 08.6 (*) 57.8 - 46.9 %   MCV 91.4  78.0 - 100.0 fL   MCH 30.5  26.0 - 34.0 pg   MCHC 33.3  30.0 - 36.0 g/dL   RDW 62.9  52.8 - 41.3 %   Platelets 356  150 - 400 K/uL  PROTIME-INR     Status: Abnormal   Collection Time    10/26/13  4:15 AM      Result Value Range   Prothrombin Time 22.9 (*) 11.6 - 15.2 seconds   INR 2.10 (*) 0.00 - 1.49  GLUCOSE, CAPILLARY     Status: None   Collection Time    10/26/13  7:53 AM      Result Value Range   Glucose-Capillary 89  70 - 99 mg/dL  GLUCOSE, CAPILLARY     Status: None   Collection Time    10/26/13  12:04 PM      Result Value Range   Glucose-Capillary 98  70 - 99 mg/dL  GLUCOSE, CAPILLARY     Status: None   Collection Time    10/26/13  4:51 PM      Result Value Range   Glucose-Capillary 98  70 - 99 mg/dL  GLUCOSE, CAPILLARY     Status: None   Collection Time    10/26/13  8:45 PM      Result Value Range   Glucose-Capillary 97  70 - 99 mg/dL  BASIC METABOLIC PANEL     Status: Abnormal   Collection Time    10/27/13  5:00 AM      Result Value Range   Sodium 131 (*) 135 - 145 mEq/L   Potassium 3.6  3.5 - 5.1 mEq/L   Chloride 87 (*) 96 - 112 mEq/L   CO2 35 (*) 19 - 32 mEq/L   Glucose, Bld 81  70 - 99 mg/dL   BUN 47 (*) 6 - 23 mg/dL   Creatinine, Ser 2.44 (*) 0.50 - 1.35 mg/dL   Calcium 8.5  8.4 - 01.0 mg/dL   GFR calc non Af Amer 46 (*) >90 mL/min   GFR calc Af Amer 53 (*) >90 mL/min   Comment: (NOTE)     The eGFR has been calculated using the CKD EPI equation.     This calculation has not been validated in all clinical situations.     eGFR's persistently <90 mL/min signify possible Chronic Kidney     Disease.  CBC     Status: Abnormal   Collection Time    10/27/13  5:00 AM      Result Value Range   WBC 9.7  4.0 - 10.5 K/uL   RBC 2.93 (*) 4.22 - 5.81 MIL/uL   Hemoglobin 8.7 (*) 13.0 - 17.0 g/dL   HCT 27.2 (*) 53.6 - 64.4 %   MCV 91.8  78.0 - 100.0 fL   MCH 29.7  26.0 - 34.0 pg   MCHC 32.3  30.0 - 36.0 g/dL   RDW 03.4  74.2 - 59.5 %   Platelets 404 (*) 150 - 400 K/uL  PROTIME-INR     Status: Abnormal   Collection Time    10/27/13  5:00 AM      Result Value Range   Prothrombin Time 28.0 (*) 11.6 -  15.2 seconds   INR 2.73 (*) 0.00 - 1.49  GLUCOSE, CAPILLARY     Status: None   Collection Time    10/27/13  7:51 AM      Result Value Range   Glucose-Capillary 89  70 - 99 mg/dL  TROPONIN I     Status: None   Collection Time    10/27/13  3:14 PM      Result Value Range   Troponin I <0.30  <0.30 ng/mL   Comment:            Due to the release kinetics of cTnI,     a  negative result within the first hours     of the onset of symptoms does not rule out     myocardial infarction with certainty.     If myocardial infarction is still suspected,     repeat the test at appropriate intervals.  BASIC METABOLIC PANEL     Status: Abnormal   Collection Time    10/27/13  3:15 PM      Result Value Range   Sodium 127 (*) 135 - 145 mEq/L   Potassium 4.3  3.5 - 5.1 mEq/L   Chloride 85 (*) 96 - 112 mEq/L   CO2 32  19 - 32 mEq/L   Glucose, Bld 114 (*) 70 - 99 mg/dL   BUN 53 (*) 6 - 23 mg/dL   Creatinine, Ser 1.19 (*) 0.50 - 1.35 mg/dL   Calcium 8.4  8.4 - 14.7 mg/dL   GFR calc non Af Amer 38 (*) >90 mL/min   GFR calc Af Amer 45 (*) >90 mL/min   Comment: (NOTE)     The eGFR has been calculated using the CKD EPI equation.     This calculation has not been validated in all clinical situations.     eGFR's persistently <90 mL/min signify possible Chronic Kidney     Disease.  BASIC METABOLIC PANEL     Status: Abnormal   Collection Time    10/28/13  5:30 AM      Result Value Range   Sodium 129 (*) 135 - 145 mEq/L   Potassium 3.7  3.5 - 5.1 mEq/L   Chloride 86 (*) 96 - 112 mEq/L   CO2 33 (*) 19 - 32 mEq/L   Glucose, Bld 93  70 - 99 mg/dL   BUN 54 (*) 6 - 23 mg/dL   Creatinine, Ser 8.29 (*) 0.50 - 1.35 mg/dL   Calcium 8.4  8.4 - 56.2 mg/dL   GFR calc non Af Amer 39 (*) >90 mL/min   GFR calc Af Amer 45 (*) >90 mL/min   Comment: (NOTE)     The eGFR has been calculated using the CKD EPI equation.     This calculation has not been validated in all clinical situations.     eGFR's persistently <90 mL/min signify possible Chronic Kidney     Disease.  CBC     Status: Abnormal   Collection Time    10/28/13  5:30 AM      Result Value Range   WBC 9.3  4.0 - 10.5 K/uL   RBC 2.90 (*) 4.22 - 5.81 MIL/uL   Hemoglobin 8.5 (*) 13.0 - 17.0 g/dL   HCT 13.0 (*) 86.5 - 78.4 %   MCV 91.7  78.0 - 100.0 fL   MCH 29.3  26.0 - 34.0 pg   MCHC 32.0  30.0 - 36.0 g/dL   RDW 69.6   29.5 -  15.5 %   Platelets 401 (*) 150 - 400 K/uL  PROTIME-INR     Status: Abnormal   Collection Time    10/28/13  5:30 AM      Result Value Range   Prothrombin Time 40.0 (*) 11.6 - 15.2 seconds   INR 4.36 (*) 0.00 - 1.49  GLUCOSE, CAPILLARY     Status: Abnormal   Collection Time    10/28/13  1:01 PM      Result Value Range   Glucose-Capillary 125 (*) 70 - 99 mg/dL  GLUCOSE, CAPILLARY     Status: Abnormal   Collection Time    10/28/13  4:29 PM      Result Value Range   Glucose-Capillary 105 (*) 70 - 99 mg/dL   Comment 1 Notify RN        Nursing note and vitals reviewed.  Constitutional: He appears well-developed and well-nourished. Alert and comfortable  HENT: oral mucosa pink and moist. Dentition fair  Head: Normocephalic.  Eyes: Conjunctivae are normal. Pupils are equal, round, and reactive to light.  Cardiovascular: Normal rate. Normal rhythm, no murmurs  Respiratory: Effort normal. Chest clear without wheezes rales or rhonchi  GI: Soft.  Musculoskeletal:  Chest wall, legs slightly tender  Neurological:  Reasonable insight and awareness. Strength 3+ deltoids and biceps, triceps, to 4/5 distally at wrist and hands. HF's 3, KE 3+, ankles 4/5. No gross sensory deficits.pt is alert and appropriate.  Skin:  Chest incision, legs clean and intact, still somewhat tender  Psychiatric: He has a normal mood and affect. His behavior is normal.   Assessment/Plan: 1. Functional deficits secondary to Deconditioning after CABG which require 3+ hours per day of interdisciplinary therapy in a comprehensive inpatient rehab setting. Physiatrist is providing close team supervision and 24 hour management of active medical problems listed below. Physiatrist and rehab team continue to assess barriers to discharge/monitor patient progress toward functional and medical goals. FIM: FIM - Bathing Bathing Steps Patient Completed: Chest;Right Arm;Left Arm;Abdomen;Front perineal area;Right upper  leg;Left upper leg Bathing: 3: Mod-Patient completes 5-7 47f 10 parts or 50-74%  FIM - Upper Body Dressing/Undressing Upper body dressing/undressing steps patient completed: Thread/unthread right sleeve of front closure shirt/dress;Thread/unthread left sleeve of front closure shirt/dress;Pull shirt around back of front closure shirt/dress;Button/unbutton shirt Upper body dressing/undressing: 0: Wears gown/pajamas-no public clothing FIM - Lower Body Dressing/Undressing Lower body dressing/undressing steps patient completed: Thread/unthread right underwear leg;Thread/unthread left underwear leg;Pull underwear up/down;Thread/unthread right pants leg;Thread/unthread left pants leg;Pull pants up/down Lower body dressing/undressing: 3: Mod-Patient completed 50-74% of tasks  FIM - Toileting Toileting steps completed by patient: Adjust clothing prior to toileting;Performs perineal hygiene;Adjust clothing after toileting Toileting: 4: Steadying assist  FIM - Diplomatic Services operational officer Devices: Elevated toilet seat;Grab bars Toilet Transfers: 4-To toilet/BSC: Min A (steadying Pt. > 75%);4-From toilet/BSC: Min A (steadying Pt. > 75%)  FIM - Bed/Chair Transfer Bed/Chair Transfer Assistive Devices: Manufacturing systems engineer Transfer: 3: Supine > Sit: Mod A (lifting assist/Pt. 50-74%/lift 2 legs  FIM - Locomotion: Wheelchair Locomotion: Wheelchair: 1: Total Assistance/staff pushes wheelchair (Pt<25%) FIM - Locomotion: Ambulation Locomotion: Ambulation Assistive Devices: Designer, industrial/product Ambulation/Gait Assistance: 4: Min assist Locomotion: Ambulation: 2: Travels 50 - 149 ft with minimal assistance (Pt.>75%)  Comprehension Comprehension Mode: Auditory Comprehension: 5-Understands complex 90% of the time/Cues < 10% of the time  Expression Expression Mode: Verbal Expression: 6-Expresses complex ideas: With extra time/assistive device  Social Interaction Social Interaction: 5-Interacts  appropriately 90% of the time - Needs monitoring or  encouragement for participation or interaction.  Problem Solving Problem Solving: 4-Solves basic 75 - 89% of the time/requires cueing 10 - 24% of the time  Memory Memory: 5-Recognizes or recalls 90% of the time/requires cueing < 10% of the time  Medical Problem List and Plan:  Deconditioning due to multiple medical issues past CABG  1. R peroneal DVT: Pharmaceutical: Coumadin  2. Pain Management: Will continue oxycodone prn--not using any at this time. Will monitor for now.  3. Anxiety disorder with panic attacks/Mood: Continue Xanax. Pt would like xanax at a schedule which is closer to his home regimen. ---will give prn during the day and 0.5 to.75mg  at bedtime.  -Team to offer ego support and encouragement. LCSW to follow for evaluation.  4. Neuropsych: This patient is capable of making decisions on his own behalf.  5. Hypotension: Off midodrine. Monitor for tolerance with increase in activity.  6. A fib: resolved and stable off amiodarone.  7. OSA: Non compliant/intolerant of CPAP--continue to encourage use. Hypoxia resolved but back on oxygen now.  8. Acute on chronic systolic CHF: Modified diet. Check weights daily. Continue lasix, amiodarone, lasix and corvedilol--Heart failure team to assist with management.  9. ICM: Fitted with life vest today  10. Acute kidney injury: Resolving. Continue to monitor lytes and urine output.  11. ABLA: Baseline Hgb 13.9 but low iron stores noted.--will supplement. Has had slow but steady downward trend in H/H. Monitor for signs of bleeding with coumadin on board. Check stool guaiacs. Check H/H twice a week.  12. Abnormal LFTs: likely medication related.  13. Stress induced hyperglycemia: Baseline Hgb A1c --5.5.   LOS (Days) 3 A FACE TO FACE EVALUATION WAS PERFORMED  Lyle Niblett E 10/28/2013, 8:21 PM

## 2013-10-28 NOTE — IPOC Note (Signed)
Overall Plan of Care [facility]) Patient Details Name: Antonio Bernard MRN: [id number] DOB: Jun 01, 1933  Admitting Diagnosis: gabg chf, life vest   Hospital Problems: Principal Problem:   Physical deconditioning Active Problems:   Obesity (BMI 35.0-39.9 without comorbidity)   Anxiety, claustrophobia   Sleep apnea- C-pap intol   Acute systolic heart failure   Acute kidney injury   Anemia associated with acute blood loss   Syncope   Hyponatremia     Functional Problem List: Nursing Edema;Endurance;Medication Management;Nutrition;Pain;Safety;Skin Integrity  PT Balance;Endurance;Motor;Pain;Safety  OT Balance;Endurance  SLP    TR         Basic ADL's: OT Grooming;Bathing;Dressing;Toileting     Advanced  ADL's: OT Simple Meal Preparation     Transfers: PT Bed Mobility;Bed to Chair;Car;Furniture  OT Toilet;Tub/Shower     Locomotion: PT Ambulation;Stairs     Additional Impairments: OT None  SLP        TR      Anticipated Outcomes Item Anticipated Outcome  Self Feeding mod I  Swallowing      Basic self-care  mod I  Toileting  mod I   Bathroom Transfers supervision  Bowel/Bladder  N/A  Transfers  S/Mod-Independent  Locomotion  150' using LRAD and up/down 2 steps (1 + 1) S/Mod-Independent  Communication     Cognition     Pain  3 or less on 0-10 scale  Safety/Judgment  min assist   Therapy Plan: PT Intensity: Minimum of 1-2 x/day ,45 to 90 minutes PT Frequency: 5 out of 7 days PT Duration Estimated Length of Stay: 10-12 days OT Intensity: Minimum of 1-2 x/day, 45 to 90 minutes OT Frequency: 5 out of 7 days OT Duration/Estimated Length of Stay: 10-12 days         Team Interventions: Nursing Interventions Patient/Family Education;Disease Management/Prevention;Pain Management;Medication Management;Skin Care/Wound Management  PT interventions Ambulation/gait training;Balance/vestibular training;Functional mobility training;Pain  management;Patient/family education;Stair training;Therapeutic Activities;Therapeutic Exercise;UE/LE Strength taining/ROM  OT Interventions Balance/vestibular training;Discharge planning;Disease mangement/prevention;DME/adaptive equipment instruction;Functional mobility training;Patient/family education;Self Care/advanced ADL retraining;Therapeutic Activities;Therapeutic Exercise;UE/LE Strength taining/ROM;UE/LE Coordination activities  SLP Interventions    TR Interventions    SW/CM Interventions Discharge Planning;Psychosocial Support;Patient/Family Education    Team Discharge Planning: Destination: PT-Home (3 daughters to provide initial supervision 24/7) ,OT- Home , SLP-  Projected Follow-up: PT-Home health PT, OT-  Home health OT, SLP-  Projected Equipment Needs: PT- , OT- Tub/shower seat, SLP-  Equipment Details: PT- , OT-  Patient/family involved in discharge planning: PT- Patient;Family member/caregiver,  OT-Patient;Family member/caregiver, SLP-   MD ELOS: 10-12 days Medical Rehab Prognosis:  Good Assessment: 79 y.o. male with history of HTN, obesity, OSA, anxiety disorder; who was admitted on 10/05/13 with STEMI. he underwent urgent cardiac catheterization by Dr. Herbie Baltimore that demonstrated a occlusion of the LAD which was opened with PTCA and IA:PB placed to help flow through LAD. He was allowed to recover from MI and underwent CABG X 1 on 10/08/13 and extubated on 10/13/13. Has required pressors and transient A fib treated with amiodarone. He had decrease in UOP with fluid overload due to decompensated CHF, hypoxia and AKI. Dr. Lorie Phenix consulted for input and recommended increasing diuresis as well as weaning off of dopamine  Now requiring 24/7 Rehab RN,MD, as well as CIR level PT, OT and SLP.  Treatment team will focus on ADLs and mobility with goals set at [facility]    See Team Conference Notes for weekly updates to the plan of care

## 2013-10-28 NOTE — Progress Notes (Signed)
Social Work Assessment and Plan Social Work Assessment and Plan  Patient Details  Name: Antonio Bernard MRN: [id number] Date of Birth: 1933/08/22  Today's Date: 10/28/2013  Problem List:  Patient Active Problem List   Diagnosis Date Noted  . Syncope 10/28/2013  . Physical deconditioning 10/25/2013  . Anemia associated with acute blood loss   . Acute systolic heart failure 10/16/2013  . Acute kidney injury 10/16/2013  . S/P CABG x 2: LIMA-LAD, SVG-DIAG 10/10/2013    Class: Chronic  . STEMI of anterior wall,  10/05/13 10/06/2013    Class: Hospitalized for  . Essential hypertension 10/06/2013    Class: Chronic  . Obesity (BMI 35.0-39.9 without comorbidity) 10/06/2013  . S/P urgent LAD PTCA 10/06/13 with IABP for LAD perfusion 10/06/2013  . Anxiety, claustrophobia 10/06/2013  . DJD  lumbar- surg August 2013 10/06/2013  . Sleep apnea- C-pap intol 10/06/2013  . AAA (abdominal aortic aneurysm)-hx of a "small" AAA found July 2013 (nothing in EPIC) 10/06/2013   Past Medical History:  Past Medical History  Diagnosis Date  . Acid reflux   . Family history of anesthesia complication     daughter had problem waking up  . Pneumonia     hx of  . Sleep apnea     no treatment for, last sleep study over 5 years ago  . Abdominal aortic aneurysm     found 06/29/12 "small"  . Headache(784.0)   . Herniated vertebral disc     thoracic area  . Cataracts, bilateral     hx of  . H/O Legionnaire's disease     about 24 years ago  . Anxiety     panic attacks   . Claustrophobia     "severe"  . Hypertension     sees  Dr. Ronne Binning [phone]  . Obesity (BMI 35.0-39.9 without comorbidity) 10/06/2013  . Essential hypertension 10/06/2013  . Anginal pain   . Atrial fibrillation     Postoperative  . Acute renal insufficiency     Postoperative  . Anemia associated with acute blood loss     Postoperative   Past Surgical History:  Past Surgical History  Procedure Laterality Date  .  Shoulder surgery      left  . Cataract extraction w/ intraocular lens implant      bilateral  . Lumbar laminectomy/decompression microdiscectomy  07/20/2012    Procedure: LUMBAR LAMINECTOMY/DECOMPRESSION MICRODISCECTOMY 1 LEVEL;  Surgeon: Reinaldo Meeker, MD;  Location: [facility];  Service: Neurosurgery;  Laterality: N/A;  lumbar four-five left  . Back surgery    . Cardiac catheterization  10/05/2013  . Eye surgery    . Coronary artery bypass graft N/A 10/08/2013    Procedure: CORONARY ARTERY BYPASS GRAFTING (CABG);  Surgeon: Kerin Perna, MD;  Location: [facility] OR;  Service: Open Heart Surgery;  Laterality: N/A;  Times 2 using left internal mammary artery and endoscopically harvested left saphenous vein  . Intraoperative transesophageal echocardiogram N/A 10/08/2013    Procedure: INTRAOPERATIVE TRANSESOPHAGEAL ECHOCARDIOGRAM;  Surgeon: Kerin Perna, MD;  Location: [facility] OR;  Service: Open Heart Surgery;  Laterality: N/A;   Social History:  reports that he has quit smoking. His smoking use included Cigarettes and Cigars. He smoked 0.50 packs per day. He has never used smokeless tobacco. He reports that he does not drink alcohol or use illicit drugs.  Family / Support Systems Marital Status: Widow/Widower Patient Roles: Parent;Other (Comment) Renato Gails) ChildrenArline Asp Powers-daughter  [phone]  [phone] Other Supports: Wilkie Aye Adams-daughter  [phone]  [phone]-cell Anticipated Caregiver: Three daughter's will rotate turns Ability/Limitations of Caregiver: Pt does not want them to have toliet him-he wants to do this on his own. Caregiver Availability: 24/7 Family Dynamics: Close knit family their are five children, who are involved and supportive with pt.  He has numerous friend snad co-pastors who are supportive along with members of his congration.  Social History Preferred language: English Religion: Baptist Cultural Background: No issues Education: Gaffer Read:  Yes Write: Yes Employment Status: Employed Name of Employer: IT sales professional of Employment: 37 Return to Work Plans: Would like too, depends upon how he does Fish farm manager Issues: No issues Guardian/Conservator: None-according to MD pt is capable of making his own decisions while here, family members are involved also   Abuse/Neglect Physical Abuse: Denies Verbal Abuse: Denies Sexual Abuse: Denies Exploitation of patient/patient's resources: Denies Self-Neglect: Denies  Emotional Status Pt's affect, behavior adn adjustment status: Pt is motivated to improve, he is concerned about his syncope episodes.  He has no warning when they are going to happen.  Daughter reports: " He will do whatever he needs to do, always been this way." Recent Psychosocial Issues: Other medical issues-has been manging well before this Pyschiatric History: History of anxiety and claustrophobia tkaes medicines for this and feels it helps.  He is adjusting to the life vest at times makes it hard to breath.  May benefit from Neur-psych to see for coping while here.  Deferred depression screen due to pt felt not necessary. Substance Abuse History: No issues  Patient / Family Perceptions, Expectations & Goals Pt/Family understanding of illness & functional limitations: Pt and daughter have a good understanind go his conditions, treatment plan and therapies.  A family member is here with him so to assist in his care and provide support.  They have limited visitors due to too many coming and tiring out pt. Premorbid pt/family roles/activities: Education officer, environmental, Father, Grandfather, etc Anticipated changes in roles/activities/participation: resume Pt/family expectations/goals: Pt states: " I want to leave soon ,but not too soon, I want to be ready."  Daughter states: " I want him to not be fainting and more stable, that scares me."  [employer]: None Premorbid Home Care/DME  Agencies: None Transportation available at discharge: [employer] referrals recommended: Support group (specify) Hydrologist Support group)  Discharge Planning Living Arrangements: Alone Support Systems: Children;Other relatives;Friends/neighbors;Church/faith community Type of Residence: Private residence Insurance Resources: Administrator (specify) Production manager) Financial Resources: Employment Surveyor, quantity Screen Referred: No Living Expenses: Other (Comment) (Pasonage home) Money Management: Patient Does the patient have any problems obtaining your medications?: No Home Management: Daughter's can do, pt was PTA Patient/Family Preliminary Plans: Return home with daughter's rotating turns to provide 24 hour care, hopeful it will be short term they will need to provide this.  Very supportive family who will do what is necessary for their Father.  Await teams' evaluations Social Work Anticipated Follow Up Needs: HH/OP;Support Group  Clinical Impression Pleasant gentleman who is motivated and willing to do his share to improve.  Very supportive family who are always here to make sure Dad has what he needs. Concerned about his syncope episodes and hopeful he will not have these when goes home.  Will work toward discharge plans.  Lucy Chris 10/28/2013, 10:46 AM

## 2013-10-28 NOTE — Progress Notes (Signed)
Patient refuses cpap tonight.

## 2013-10-28 NOTE — Progress Notes (Signed)
Physical Therapy Session Note  Patient Details  Name: Antonio Bernard MRN: [id number] Date of Birth: [birth year]  Today's Date: 10/28/2013 Time: 0900-0958 Time Calculation (min): 58 min  Short Term Goals: Week 1:     Skilled Therapeutic Interventions/Progress Updates:   Pt received lying in bed this morning with daughter present for session.  Pt agreeable to session and states that he needs to use the restroom.  Performed supine to sit at mod assist with [facility] mostly flat (did not flatten due to code yesterday with HOB in Trendelenburg).  Requires assist to elevate trunk due to sternal precautions.  Once sitting at EOB, recommended that pt sit for several mins before standing due to slight c/o dizziness.  Pt then able to stand with min assist for facilitating increased forward weight shift and cues for decreased UE use to maintain sternal precautions.  Also had pt stand for a few mins before ambulating to restroom.  Then ambulated x 15' at min/guard to min assist with use of RW to restroom.  Provided total assist for adjusting clothing/brief before and following toileting.  Note that brief was saturated, however he did urinate in toilet.  Ambulated to w/c in room to allow rest break before initiating gait training in hallway with RW towards gym.  Performed gait training x 30' x 1 and another 25' x 1 at min assist with RW with mod cues for continued pursed lip breathing and to maintain upright posture throughout.  Pt fatigues very quickly and feels as though he is "woozy."  Vitals were WNL following first ambulation, continued to allow rest breaks as needed.  Assisted remainder of distance to gym and provided and performed OTAGO HEP for strengthening, balance and endurance.  Performed seated LAQ x 10 reps BLE, standing hip abd x 10 reps BLE, standing knee flex x 10 reps BLE, standing mini squats x 10 reps, and heel/toe raises x 10 reps each.  Utilized RW for UE support and educated on how to advance at  home.  Assisted pt back to room and left in w/c with daughter present and all needs in reach.   Note:  Maintained pt on 4L O2 throughout session with SaO2 at 94% or better during session.   Therapy Documentation Precautions:  Precautions Precautions: Sternal;Fall Precaution Comments: Syncope with PT. Restrictions Weight Bearing Restrictions: No   Vital Signs: Therapy Vitals Pulse Rate: 80 BP: 121/60 mmHg Patient Position, if appropriate: Sitting Oxygen Therapy SpO2: 99 % O2 Device: Nasal cannula O2 Flow Rate (L/min): 4 L/min Pulse Oximetry Type: Intermittent Pain: Pain Assessment Pain Assessment: No/denies pain Pain Score: 2    Locomotion : Ambulation Ambulation/Gait Assistance: 4: Min assist   See FIM for current functional status  Therapy/Group: Individual Therapy  Vista Deck 10/28/2013, 4:17 PM

## 2013-10-28 NOTE — Plan of Care (Signed)
Problem: RH PAIN MANAGEMENT Goal: RH STG PAIN MANAGED AT OR BELOW PT'S PAIN GOAL Pain level less than 3/10  Outcome: Progressing No c/o pain     

## 2013-10-28 NOTE — Progress Notes (Signed)
Advanced Heart Failure Rounding Note   Subjective:     Antonio Bernard is a 79 yo male with a hx of HTN, severe anxiety, and OSA who presented to the ED 10/05/13 with a STEMI and was taken to the cath lab. Cath revealed proximally occluded LAD - after flow was restored it was apparently there was extensive LAD disease as well as severe 90-95% ostial D1 disease . There was moderate to severe focal disease of the sub-branches as well. Balloon angioplasty was performed, but no stent was placed. TIMI 2 flow was noted in the LAD and an IABP was placed. He was then evaluated by TCTS and was taken for a CABG x2 LIMA-LAD; SVG-DIAG on 10/08/13. After CABG he was brought back to the ICU still on IABP and multiple pressors. The balloon pump was weaned off 10/10/13 and he was extubated 10/13/13. Patient developed transient afib on 11/11 and was started on Amiodarone gtt.   11/11/4 Venous doppler acute deep DVT involving R peroneal vein 10/15/13 EF 30-35%. RV ok.  10/24/13 ECHO EF 25% RV ok. Lifevest placed.  Transferred to CIR   Doing fairly well but yesterday while lying back in bed and being positioned had a syncopal episode. Code Blue called but patient recovered on his own. Initial rhythm was NSR. No alarms on Vest. Feels fine now  Objective:   Weight Range:  Vital Signs:   Temp:  [97.8 F (36.6 C)] 97.8 F (36.6 C) (11/24 0501) Pulse Rate:  [52-87] 79 (11/24 0501) Resp:  [18] 18 (11/24 0501) BP: (103-123)/(59-69) 103/69 mmHg (11/24 0501) SpO2:  [92 %-100 %] 95 % (11/24 0501) Weight:  [110.587 kg (243 lb 12.8 oz)] 110.587 kg (243 lb 12.8 oz) (11/24 0501) Last BM Date: 10/26/13  Weight change: Filed Weights   10/26/13 0504 10/27/13 0455 10/28/13 0501  Weight: 111.3 kg (245 lb 6 oz) 112.8 kg (248 lb 10.9 oz) 110.587 kg (243 lb 12.8 oz)    Intake/Output:   Intake/Output Summary (Last 24 hours) at 10/28/13 0746 Last data filed at 10/28/13 0500  Gross per 24 hour  Intake    240 ml  Output     475 ml  Net   -235 ml     Physical Exam: General:   NAD Lying in bed HEENT: normal Neck: supple. JVP 7. Carotids 2+ bilat; no bruits. No lymphadenopathy or thryomegaly appreciated. Cor: PMI nondisplaced. Regular rate & rhythm. No rubs, gallops or murmurs.Sternal incision approximated.  Lungs: clear Abdomen: soft, nontender, non-distended. No hepatosplenomegaly. No bruits or masses. Good bowel sounds. Extremities: no cyanosis, clubbing, rash, no edema. Bilateral ted hose. BLE 2+ edema.  RUE triple lumen picc Neuro: alert & orientedx3, cranial nerves grossly intact. moves all 4 extremities w/o difficulty. Affect pleasant   Labs: Basic Metabolic Panel:  Recent Labs Lab 10/22/13 0530  10/25/13 0450 10/26/13 0415 10/27/13 0500 10/27/13 1515 10/28/13 0530  NA 136  < > 133* 131* 131* 127* 129*  K 3.8  < > 3.9 3.8 3.6 4.3 3.7  CL 89*  < > 89* 88* 87* 85* 86*  CO2 41*  < > 35* 34* 35* 32 33*  GLUCOSE 92  < > 100* 100* 81 114* 93  BUN 44*  < > 40* 44* 47* 53* 54*  CREATININE 1.66*  < > 1.64* 1.68* 1.43* 1.65* 1.63*  CALCIUM 9.1  < > 8.6 8.5 8.5 8.4 8.4  PHOS 4.7*  --   --   --   --   --   --   < > =  values in this interval not displayed.  Liver Function Tests:  Recent Labs Lab 10/22/13 0530  AST 52*  ALT 54*  ALKPHOS 80  BILITOT 1.1  PROT 5.9*  ALBUMIN 2.7*   No results found for this basename: LIPASE, AMYLASE,  in the last 168 hours No results found for this basename: AMMONIA,  in the last 168 hours  CBC:  Recent Labs Lab 10/24/13 0425 10/25/13 0450 10/26/13 0415 10/27/13 0500 10/28/13 0530  WBC 9.5 10.6* 10.6* 9.7 9.3  HGB 9.1* 9.2* 8.9* 8.7* 8.5*  HCT 28.0* 28.3* 26.7* 26.9* 26.6*  MCV 91.2 92.2 91.4 91.8 91.7  PLT 352 363 356 404* 401*    Cardiac Enzymes:  Recent Labs Lab 10/27/13 1514  TROPONINI <0.30    BNP: BNP (last 3 results)  Recent Labs  10/07/13 0400 10/17/13 0415  PROBNP 3092.0* 30734.0*    Imaging: Dg Chest Port 1  View  10/27/2013   CLINICAL DATA:  Short of breath  EXAM: PORTABLE CHEST - 1 VIEW  COMPARISON:  10/23/2013  FINDINGS: Progress of bilateral airspace disease consistent with pulmonary edema. Increase in bilateral effusion and bibasilar atelectasis.  Right arm PICC tip in the SVC unchanged.  IMPRESSION: Progressive heart failure with edema and bilateral pleural effusions.   Electronically Signed   By: Marlan Palau M.D.   On: 10/27/2013 16:43     Medications:     Scheduled Medications: . ALPRAZolam  0.5-0.75 mg Oral QHS  . aspirin EC  81 mg Oral Daily  . atorvastatin  80 mg Oral q1800  . bisacodyl  10 mg Oral Daily   Or  . bisacodyl  10 mg Rectal Daily  . budesonide (PULMICORT) nebulizer solution  0.25 mg Nebulization BID  . carvedilol  3.125 mg Oral BID WC  . docusate sodium  200 mg Oral Daily  . feeding supplement (GLUCERNA SHAKE)  237 mL Oral TID BM  . insulin aspart  0-15 Units Subcutaneous TID WC  . levalbuterol  1.25 mg Nebulization BID  . pantoprazole  80 mg Oral Q1200  . potassium chloride  20 mEq Oral Daily  . spironolactone  25 mg Oral Daily  . Warfarin - Pharmacist Dosing Inpatient   Does not apply q1800    Infusions:    PRN Medications: ALPRAZolam, alum & mag hydroxide-simeth, guaiFENesin-dextromethorphan, levalbuterol, ondansetron (ZOFRAN) IV, ondansetron, oxyCODONE, sodium chloride, traMADol   Assessment:   1) CAD   S/p CABG x 2 10/08/13 2) STEMI 3) A/C systolic HF    EF 30-35% 4) HTN 5) OSA 6) Severe Anxiety 7) Respiratory failure,   -- vent-mask 50% 8) Afib 9) AKI 10) DVT   -- R peroneal vein   -- on lovenox and coumadin 11) Hypokalemia/hyponatremia  Plan/Discussion:    Overall doing fairly well. Suspect episode yesterday was vasovagal/neurocardiogenic syncope.  His weight is down but still with significant edema. Will restart lasix at 40 daily as tolerated.  Agree with placing TED hose. Will check orthostatics.   Would not titrate HF meds  more at this point.  Continue coumadin for PE. INR high today, pharmacy to adjust,  [name] 7:46 AM

## 2013-10-28 NOTE — Progress Notes (Signed)
Physical Therapy Session Note  Patient Details  Name: Antonio Bernard MRN: [id number] Date of Birth: 1933/05/25  Today's Date: 10/28/2013 Time: 9147-8295 Time Calculation (min): 39 min   Skilled Therapeutic Interventions/Progress Updates:   Pt sitting up in w/c with LE elevated secondary to orthostatic episode earlier with OT.  Assessed BP with LE elevated; see below.  Lowered pt LE to floor; pt denied dizziness or lightheadedness.  Pt shifted backwards in w/c by pushing off of bilat arm rests.  Re-educated pt on sternal precautions and demonstrated to pt alternate, safer way to scoot posterior in w/c. Transported to gym in w/c with total A secondary to sternal precautions.  Pt performed multiple sit <> stands from w/c without use of UE on arm rests with min A.  Performed stair negotiation training up/down 2 stairs with 2 rails x 3 reps beginning with step to sequence progressing to alternating sequence min A overall with one sitting rest break secondary to dizziness.  BP re-assessed 108/66, HR: 80 bpm.  During seated rest break educated pt on pursed lip breathing and had pt return demonstrate.  Returned to room in w/c and performed transfer w/c > bed with min A and sit > supine with mod A with pt bracing chest with pillow and mod A to bring bilat LE into the bed.  Pt tolerated well but was very fatigued.    Therapy Documentation Precautions:  Precautions Precautions: Sternal;Fall Precaution Comments: Syncope with PT. Restrictions Weight Bearing Restrictions: No Vital Signs: Therapy Vitals Pulse Rate: 80 BP: 121/60 mmHg Patient Position, if appropriate: Sitting Oxygen Therapy SpO2: 99 % O2 Device: Nasal cannula O2 Flow Rate (L/min): 4 L/min Pulse Oximetry Type: Intermittent Pain: Pain Assessment Pain Assessment: No/denies pain Pain Score: 2   See FIM for current functional status  Therapy/Group: Individual Therapy  [name] [facility] 10/28/2013, 4:15 PM

## 2013-10-28 NOTE — Progress Notes (Signed)
ANTICOAGULATION CONSULT NOTE - Follow Up Consult  Pharmacy Consult:  Coumadin Indication:  DVT  Allergies  Allergen Reactions  . Demerol [Meperidine] Other (See Comments)    "makes me crazy"  . Lisinopril Diarrhea  . Shellfish Allergy Rash    Patient Measurements: Height: 6\' 1"  (185.4 cm) Weight: 243 lb 12.8 oz (110.587 kg) IBW/kg (Calculated) : 79.9  Vital Signs: Temp: 97.8 F (36.6 C) (11/24 0501) Temp src: Oral (11/24 0501) BP: 97/60 mmHg (11/24 0800) Pulse Rate: 79 (11/24 0501)  Labs:  Recent Labs  10/26/13 0415 10/27/13 0500 10/27/13 1514 10/27/13 1515 10/28/13 0530  HGB 8.9* 8.7*  --   --  8.5*  HCT 26.7* 26.9*  --   --  26.6*  PLT 356 404*  --   --  401*  LABPROT 22.9* 28.0*  --   --  40.0*  INR 2.10* 2.73*  --   --  4.36*  CREATININE 1.68* 1.43*  --  1.65* 1.63*  TROPONINI  --   --  <0.30  --   --     Estimated Creatinine Clearance: 49.5 ml/min (by C-G formula based on Cr of 1.63).    Assessment: 79 YOM on Coumadin for DVT.  INR increased to 4.36 from 2.73; supratherapeutic today.  No bleeding reported.   11/11/4 Venous doppler acute deep DVT involving R peroneal vein    Goal of Therapy:  INR 2-3 Monitor platelets by anticoagulation protocol: Yes    Plan:  - No Coumadin today - Daily PT / INR - F/U ACEi/ARB when able  Noah Delaine, RPh Clinical Pharmacist Pager: [phone] 10/28/2013, 12:14 PM

## 2013-10-29 ENCOUNTER — Inpatient Hospital Stay (HOSPITAL_COMMUNITY): Payer: Medicare (Managed Care) | Admitting: Rehabilitation

## 2013-10-29 ENCOUNTER — Encounter (HOSPITAL_COMMUNITY): Payer: Medicare (Managed Care) | Admitting: Occupational Therapy

## 2013-10-29 ENCOUNTER — Inpatient Hospital Stay (HOSPITAL_COMMUNITY): Payer: Medicare (Managed Care) | Admitting: Occupational Therapy

## 2013-10-29 LAB — CBC
Hemoglobin: 8.8 g/dL — ABNORMAL LOW (ref 13.0–17.0)
MCH: 29.7 pg (ref 26.0–34.0)
MCHC: 32.4 g/dL (ref 30.0–36.0)
MCV: 91.9 fL (ref 78.0–100.0)
Platelets: 434 10*3/uL — ABNORMAL HIGH (ref 150–400)
RBC: 2.96 MIL/uL — ABNORMAL LOW (ref 4.22–5.81)
RDW: 14.4 % (ref 11.5–15.5)
WBC: 8.2 10*3/uL (ref 4.0–10.5)

## 2013-10-29 LAB — BASIC METABOLIC PANEL
BUN: 57 mg/dL — ABNORMAL HIGH (ref 6–23)
CO2: 33 mEq/L — ABNORMAL HIGH (ref 19–32)
GFR calc non Af Amer: 42 mL/min — ABNORMAL LOW (ref >90)
Glucose, Bld: 94 mg/dL (ref 70–99)
Potassium: 3.6 mEq/L (ref 3.5–5.1)
Sodium: 128 mEq/L — ABNORMAL LOW (ref 135–145)

## 2013-10-29 LAB — GLUCOSE, CAPILLARY
Glucose-Capillary: 113 mg/dL — ABNORMAL HIGH (ref 70–99)
Glucose-Capillary: 106 mg/dL — ABNORMAL HIGH (ref 70–99)
Glucose-Capillary: 108 mg/dL — ABNORMAL HIGH (ref 70–99)

## 2013-10-29 LAB — PROTIME-INR: Prothrombin Time: 34.3 seconds — ABNORMAL HIGH (ref 11.6–15.2)

## 2013-10-29 MED ORDER — FUROSEMIDE 10 MG/ML IJ SOLN
80.0000 mg | Freq: Once | INTRAMUSCULAR | Status: AC
  Administered 2013-10-29: 80 mg via INTRAVENOUS
  Filled 2013-10-29: qty 8

## 2013-10-29 MED ORDER — POTASSIUM CHLORIDE CRYS ER 20 MEQ PO TBCR
40.0000 meq | EXTENDED_RELEASE_TABLET | Freq: Once | ORAL | Status: AC
  Administered 2013-10-29: 40 meq via ORAL
  Filled 2013-10-29: qty 2

## 2013-10-29 NOTE — Progress Notes (Signed)
Subjective/Complaints: 79 y.o. male with history of HTN, obesity, OSA, anxiety disorder; who was admitted on 10/05/13 with STEMI. he underwent urgent cardiac catheterization by Dr. Herbie [place] that demonstrated a occlusion of the LAD which was opened with PTCA and IA:PB placed to help flow through LAD. He was allowed to recover from MI and underwent CABG X 1 on 10/08/13 and extubated on 10/13/13. Has required pressors and transient A fib treated with amiodarone. He had decrease in UOP with fluid overload due to decompensated CHF, hypoxia and AKI. Dr. Lorie Phenix consulted for input and recommended increasing diuresis as well as weaning off of dopamine. LE dopplers done due to hypoxia and right peroneal vein DVT noted and patient started on heparin for treatment.  Renal failure slowly improving and urine output improved with improvement in respiratory status. He has had syncope due to orthostasis and started on midodrine for BP support. Has been refusing CPAP. He continues to have SOB and diuretics being adjusted to help with fluid overload as well as orthostatic symptoms. LifeVest recommended due to ICM--repeat echo with severe hypokinesis and septal and apex akinesis with EF 25%. done. Patient refusing Life Vest due to "claustophobia" but willing to try for a period of time. Patient deconditioned and CIR recommended by MD and therapy team.   No further Syncopal episodes.  History of orthostatic events. Appreciate cardiac note  Objective: Vital Signs: Blood pressure 134/74, pulse 77, temperature 97.9 F (36.6 C), temperature source Oral, resp. rate 18, height 6\' 1"  (1.854 m), weight 114.034 kg (251 lb 6.4 oz), SpO2 97.00%. Dg Chest Port 1 View  10/27/2013   CLINICAL DATA:  Short of breath  EXAM: PORTABLE CHEST - 1 VIEW  COMPARISON:  10/23/2013  FINDINGS: Progress of bilateral airspace disease consistent with pulmonary edema. Increase in bilateral effusion and bibasilar atelectasis.  Right arm PICC tip in the  SVC unchanged.  IMPRESSION: Progressive heart failure with edema and bilateral pleural effusions.   Electronically Signed   By: Marlan Palau M.D.   On: 10/27/2013 16:43   Results for orders placed during the hospital encounter of 10/25/13 (from the past 72 hour(s))  GLUCOSE, CAPILLARY     Status: None   Collection Time    10/26/13 12:04 PM      Result Value Range   Glucose-Capillary 98  70 - 99 mg/dL  GLUCOSE, CAPILLARY     Status: None   Collection Time    10/26/13  4:51 PM      Result Value Range   Glucose-Capillary 98  70 - 99 mg/dL  GLUCOSE, CAPILLARY     Status: None   Collection Time    10/26/13  8:45 PM      Result Value Range   Glucose-Capillary 97  70 - 99 mg/dL  BASIC METABOLIC PANEL     Status: Abnormal   Collection Time    10/27/13  5:00 AM      Result Value Range   Sodium 131 (*) 135 - 145 mEq/L   Potassium 3.6  3.5 - 5.1 mEq/L   Chloride 87 (*) 96 - 112 mEq/L   CO2 35 (*) 19 - 32 mEq/L   Glucose, Bld 81  70 - 99 mg/dL   BUN 47 (*) 6 - 23 mg/dL   Creatinine, Ser 1.61 (*) 0.50 - 1.35 mg/dL   Calcium 8.5  8.4 - 09.6 mg/dL   GFR calc non Af Amer 46 (*) >90 mL/min   GFR calc Af Amer 53 (*) >90 mL/min  Comment: (NOTE)     The eGFR has been calculated using the CKD EPI equation.     This calculation has not been validated in all clinical situations.     eGFR's persistently <90 mL/min signify possible Chronic Kidney     Disease.  CBC     Status: Abnormal   Collection Time    10/27/13  5:00 AM      Result Value Range   WBC 9.7  4.0 - 10.5 K/uL   RBC 2.93 (*) 4.22 - 5.81 MIL/uL   Hemoglobin 8.7 (*) 13.0 - 17.0 g/dL   HCT 16.1 (*) 09.6 - 04.5 %   MCV 91.8  78.0 - 100.0 fL   MCH 29.7  26.0 - 34.0 pg   MCHC 32.3  30.0 - 36.0 g/dL   RDW 40.9  81.1 - 91.4 %   Platelets 404 (*) 150 - 400 K/uL  PROTIME-INR     Status: Abnormal   Collection Time    10/27/13  5:00 AM      Result Value Range   Prothrombin Time 28.0 (*) 11.6 - 15.2 seconds   INR 2.73 (*) 0.00 - 1.49   GLUCOSE, CAPILLARY     Status: None   Collection Time    10/27/13  7:51 AM      Result Value Range   Glucose-Capillary 89  70 - 99 mg/dL  TROPONIN I     Status: None   Collection Time    10/27/13  3:14 PM      Result Value Range   Troponin I <0.30  <0.30 ng/mL   Comment:            Due to the release kinetics of cTnI,     a negative result within the first hours     of the onset of symptoms does not rule out     myocardial infarction with certainty.     If myocardial infarction is still suspected,     repeat the test at appropriate intervals.  BASIC METABOLIC PANEL     Status: Abnormal   Collection Time    10/27/13  3:15 PM      Result Value Range   Sodium 127 (*) 135 - 145 mEq/L   Potassium 4.3  3.5 - 5.1 mEq/L   Chloride 85 (*) 96 - 112 mEq/L   CO2 32  19 - 32 mEq/L   Glucose, Bld 114 (*) 70 - 99 mg/dL   BUN 53 (*) 6 - 23 mg/dL   Creatinine, Ser 7.82 (*) 0.50 - 1.35 mg/dL   Calcium 8.4  8.4 - 95.6 mg/dL   GFR calc non Af Amer 38 (*) >90 mL/min   GFR calc Af Amer 45 (*) >90 mL/min   Comment: (NOTE)     The eGFR has been calculated using the CKD EPI equation.     This calculation has not been validated in all clinical situations.     eGFR's persistently <90 mL/min signify possible Chronic Kidney     Disease.  BASIC METABOLIC PANEL     Status: Abnormal   Collection Time    10/28/13  5:30 AM      Result Value Range   Sodium 129 (*) 135 - 145 mEq/L   Potassium 3.7  3.5 - 5.1 mEq/L   Chloride 86 (*) 96 - 112 mEq/L   CO2 33 (*) 19 - 32 mEq/L   Glucose, Bld 93  70 - 99 mg/dL   BUN 54 (*)  6 - 23 mg/dL   Creatinine, Ser 4.09 (*) 0.50 - 1.35 mg/dL   Calcium 8.4  8.4 - 81.1 mg/dL   GFR calc non Af Amer 39 (*) >90 mL/min   GFR calc Af Amer 45 (*) >90 mL/min   Comment: (NOTE)     The eGFR has been calculated using the CKD EPI equation.     This calculation has not been validated in all clinical situations.     eGFR's persistently <90 mL/min signify possible Chronic Kidney      Disease.  CBC     Status: Abnormal   Collection Time    10/28/13  5:30 AM      Result Value Range   WBC 9.3  4.0 - 10.5 K/uL   RBC 2.90 (*) 4.22 - 5.81 MIL/uL   Hemoglobin 8.5 (*) 13.0 - 17.0 g/dL   HCT 91.4 (*) 78.2 - 95.6 %   MCV 91.7  78.0 - 100.0 fL   MCH 29.3  26.0 - 34.0 pg   MCHC 32.0  30.0 - 36.0 g/dL   RDW 21.3  08.6 - 57.8 %   Platelets 401 (*) 150 - 400 K/uL  PROTIME-INR     Status: Abnormal   Collection Time    10/28/13  5:30 AM      Result Value Range   Prothrombin Time 40.0 (*) 11.6 - 15.2 seconds   INR 4.36 (*) 0.00 - 1.49  GLUCOSE, CAPILLARY     Status: Abnormal   Collection Time    10/28/13  1:01 PM      Result Value Range   Glucose-Capillary 125 (*) 70 - 99 mg/dL  GLUCOSE, CAPILLARY     Status: Abnormal   Collection Time    10/28/13  4:29 PM      Result Value Range   Glucose-Capillary 105 (*) 70 - 99 mg/dL   Comment 1 Notify RN    GLUCOSE, CAPILLARY     Status: Abnormal   Collection Time    10/28/13  8:44 PM      Result Value Range   Glucose-Capillary 120 (*) 70 - 99 mg/dL  BASIC METABOLIC PANEL     Status: Abnormal   Collection Time    10/29/13  5:00 AM      Result Value Range   Sodium 128 (*) 135 - 145 mEq/L   Potassium 3.6  3.5 - 5.1 mEq/L   Chloride 85 (*) 96 - 112 mEq/L   CO2 33 (*) 19 - 32 mEq/L   Glucose, Bld 94  70 - 99 mg/dL   BUN 57 (*) 6 - 23 mg/dL   Creatinine, Ser 4.69 (*) 0.50 - 1.35 mg/dL   Calcium 8.3 (*) 8.4 - 10.5 mg/dL   GFR calc non Af Amer 42 (*) >90 mL/min   GFR calc Af Amer 48 (*) >90 mL/min   Comment: (NOTE)     The eGFR has been calculated using the CKD EPI equation.     This calculation has not been validated in all clinical situations.     eGFR's persistently <90 mL/min signify possible Chronic Kidney     Disease.  CBC     Status: Abnormal   Collection Time    10/29/13  5:00 AM      Result Value Range   WBC 8.2  4.0 - 10.5 K/uL   RBC 2.96 (*) 4.22 - 5.81 MIL/uL   Hemoglobin 8.8 (*) 13.0 - 17.0 g/dL   HCT  62.9 (*) 52.8 -  52.0 %   MCV 91.9  78.0 - 100.0 fL   MCH 29.7  26.0 - 34.0 pg   MCHC 32.4  30.0 - 36.0 g/dL   RDW 40.9  81.1 - 91.4 %   Platelets 434 (*) 150 - 400 K/uL  PROTIME-INR     Status: Abnormal   Collection Time    10/29/13  5:00 AM      Result Value Range   Prothrombin Time 34.3 (*) 11.6 - 15.2 seconds   INR 3.56 (*) 0.00 - 1.49  GLUCOSE, CAPILLARY     Status: Abnormal   Collection Time    10/29/13  7:18 AM      Result Value Range   Glucose-Capillary 113 (*) 70 - 99 mg/dL   Comment 1 Notify RN        Nursing note and vitals reviewed.  Constitutional: He appears well-developed and well-nourished. Alert and comfortable  HENT: oral mucosa pink and moist. Dentition fair  Head: Normocephalic.  Eyes: Conjunctivae are normal. Pupils are equal, round, and reactive to light.  Cardiovascular: Normal rate. Normal rhythm, no murmurs  Respiratory: Effort normal. Chest clear without wheezes rales or rhonchi  GI: Soft.  Musculoskeletal:  Chest wall, legs slightly tender  Neurological:  Reasonable insight and awareness. Strength 3+ deltoids and biceps, triceps, to 4/5 distally at wrist and hands. HF's 3, KE 3+, ankles 4/5. No gross sensory deficits.pt is alert and appropriate.  Skin:  Chest incision, legs clean and intact, still somewhat tender  Psychiatric: He has a normal mood and affect. His behavior is normal.   Assessment/Plan: 1. Functional deficits secondary to Deconditioning after CABG which require 3+ hours per day of interdisciplinary therapy in a comprehensive inpatient rehab setting. Physiatrist is providing close team supervision and 24 hour management of active medical problems listed below. Physiatrist and rehab team continue to assess barriers to discharge/monitor patient progress toward functional and medical goals. FIM: FIM - Bathing Bathing Steps Patient Completed: Chest;Right Arm;Left Arm;Abdomen;Front perineal area;Right upper leg;Left upper leg Bathing: 3:  Mod-Patient completes 5-7 3f 10 parts or 50-74%  FIM - Upper Body Dressing/Undressing Upper body dressing/undressing steps patient completed: Thread/unthread right sleeve of front closure shirt/dress;Thread/unthread left sleeve of front closure shirt/dress;Pull shirt around back of front closure shirt/dress;Button/unbutton shirt Upper body dressing/undressing: 0: Wears gown/pajamas-no public clothing FIM - Lower Body Dressing/Undressing Lower body dressing/undressing steps patient completed: Thread/unthread right underwear leg;Thread/unthread left underwear leg;Pull underwear up/down;Thread/unthread right pants leg;Thread/unthread left pants leg;Pull pants up/down Lower body dressing/undressing: 3: Mod-Patient completed 50-74% of tasks  FIM - Toileting Toileting steps completed by patient: Adjust clothing prior to toileting;Performs perineal hygiene;Adjust clothing after toileting Toileting: 4: Steadying assist  FIM - Diplomatic Services operational officer Devices: Elevated toilet seat;Grab bars Toilet Transfers: 4-To toilet/BSC: Min A (steadying Pt. > 75%);4-From toilet/BSC: Min A (steadying Pt. > 75%)  FIM - Bed/Chair Transfer Bed/Chair Transfer Assistive Devices: Manufacturing systems engineer Transfer: 3: Supine > Sit: Mod A (lifting assist/Pt. 50-74%/lift 2 legs  FIM - Locomotion: Wheelchair Locomotion: Wheelchair: 1: Total Assistance/staff pushes wheelchair (Pt<25%) FIM - Locomotion: Ambulation Locomotion: Ambulation Assistive Devices: Designer, industrial/product Ambulation/Gait Assistance: 4: Min assist Locomotion: Ambulation: 2: Travels 50 - 149 ft with minimal assistance (Pt.>75%)  Comprehension Comprehension Mode: Auditory Comprehension: 5-Understands basic 90% of the time/requires cueing < 10% of the time  Expression Expression Mode: Verbal Expression: 6-Expresses complex ideas: With extra time/assistive device  Social Interaction Social Interaction: 5-Interacts appropriately 90% of the  time - Needs monitoring or  encouragement for participation or interaction.  Problem Solving Problem Solving: 4-Solves basic 75 - 89% of the time/requires cueing 10 - 24% of the time  Memory Memory: 5-Recognizes or recalls 90% of the time/requires cueing < 10% of the time  Medical Problem List and Plan:  Deconditioning due to multiple medical issues past CABG  1. R peroneal DVT: Pharmaceutical: Coumadin  2. Pain Management: Will continue oxycodone prn--not using any at this time. Will monitor for now.  3. Anxiety disorder with panic attacks/Mood: Continue Xanax. Pt would like xanax at a schedule which is closer to his home regimen. ---will give prn during the day and 0.5 to.75mg  at bedtime.  -Team to offer ego support and encouragement. LCSW to follow for evaluation.  4. Neuropsych: This patient is capable of making decisions on his own behalf.  5. Hypotension: Off midodrine. Monitor for tolerance with increase in activity.  6. A fib: resolved and stable off amiodarone.  7. OSA: Non compliant/intolerant of CPAP--continue to encourage use. Hypoxia resolved but back on oxygen now.  8. Acute on chronic systolic CHF: Modified diet. Check weights daily. Continue lasix, amiodarone, lasix and corvedilol--Heart failure team to assist with management.  9. ICM: Fitted with life vest  10. Acute kidney injury: Resolving. Continue to monitor lytes and urine output.  11. ABLA: Baseline Hgb 13.9 but low iron stores noted.--will supplement. Has had slow but steady downward trend in H/H. Monitor for signs of bleeding with coumadin on board. Check stool guaiacs. Check H/H twice a week. Stable today 12. Abnormal LFTs: likely medication related.  13. Stress induced hyperglycemia: Baseline Hgb A1c --5.5.   LOS (Days) 4 A FACE TO FACE EVALUATION WAS PERFORMED  KIRSTEINS,ANDREW E 10/29/2013, 11:18 AM

## 2013-10-29 NOTE — Progress Notes (Signed)
Late entry 1740: Nurse called to room by Hawa, NT who had performed orthostatic vitals. Pt c/o of dizziness after standing, and was concerned about syncope.( Pt had prior syncope episode on 10/27/13). Pt lying in bed when nurse entered the room. Pt reported shortness of breath and seeing of white lights, pt visibly diaphoretic. Daughter in room, and requested prn breathing treatment. Respiratory therapy contacted, and breathing treatment given. IV lasix also administered, per med order. Pt reported minimal relief from breathing treatment. No results from IV lasix at this time.   Pt reported only 4 hours of uninterrupted sleep during previous night. Advised daughter that we could attempt to minimize sleep interruption during the night to allow pt to get adequate rest. Family agreeable with plan. Oncoming nurse to continue to monitor, and advised of minimal sleep interruption. Because pt experience SOB with minimal exertion, condom cath placed to minimize use of urinal.  Which pt reports contributes to SOB, because of multiple attempts to place.

## 2013-10-29 NOTE — Progress Notes (Signed)
ANTICOAGULATION CONSULT NOTE - Follow Up Consult  Pharmacy Consult:  Coumadin Indication:  DVT  Allergies  Allergen Reactions  . Demerol [Meperidine] Other (See Comments)    "makes me crazy"  . Lisinopril Diarrhea  . Shellfish Allergy Rash    Patient Measurements: Height: 6\' 1"  (185.4 cm) Weight: 251 lb 6.4 oz (114.034 kg) IBW/kg (Calculated) : 79.9  Vital Signs: Temp: 97.9 F (36.6 C) (11/25 0443) Temp src: Oral (11/25 0443) BP: 134/74 mmHg (11/25 0916) Pulse Rate: 69 (11/25 0443)  Labs:  Recent Labs  10/27/13 0500 10/27/13 1514 10/27/13 1515 10/28/13 0530 10/29/13 0500  HGB 8.7*  --   --  8.5* 8.8*  HCT 26.9*  --   --  26.6* 27.2*  PLT 404*  --   --  401* 434*  LABPROT 28.0*  --   --  40.0* 34.3*  INR 2.73*  --   --  4.36* 3.56*  CREATININE 1.43*  --  1.65* 1.63* 1.54*  TROPONINI  --  <0.30  --   --   --     Estimated Creatinine Clearance: 53.1 ml/min (by C-G formula based on Cr of 1.54).    Assessment: 79 YOM on Coumadin for DVT.  INR=3.56, down from 4.36 yesterday but still supratherapeutic.  No bleeding reported.   11/11/4 Venous doppler acute deep DVT involving R peroneal vein  Goal of Therapy:  INR 2-3 Monitor platelets by anticoagulation protocol: Yes    Plan:  No Coumadin today Daily PT / INR  Wendie Simmer, PharmD, BCPS Clinical Pharmacist  Pager: [phone]

## 2013-10-29 NOTE — Progress Notes (Signed)
Physical Therapy Session Note  Patient Details  Name: Antonio Bernard MRN: [id number] Date of Birth: 29-Jan-1933  Today's Date: 10/29/2013 Time: 4098-1191 Time Calculation (min): 58 min  Short Term Goals: Week 1:     Skilled Therapeutic Interventions/Progress Updates:   Pt received sitting in w/c in room, finishing up breakfast this morning.  He states that it is difficult to eat due to SOB during eating.  PA notified and states that they could get him a chopped diet, however daughter states that she will continue to assist him with meals.  Assisted pt to gym with 4 L O2 on at all times during session.  Performed stand pivot w/c <> nustep at min/guard assist with use of RW and min cues to keep RW with him until all the way at seated surface.  Performed seated nurstep x 5 mins at level 1 resistance with LEs only due to sternal precautions in order to increase BLE strength and overall activity endurance.  Pts SaO2 was 95% HR 78 prior to activity, at three mins was 90-91% HR 80 and after activity was 90% HR 81.  Pt reports Borg RPE to be 14 during activity.  Allowed several mins following this to rest seated on nustep then again once seated in chair.  Assisted into hallway to perform gait training to ADL apt, however PA to speak with pt for several mins before continuing.  Pt ambulated approx 30' to ADL apt with RW at min/guard assist and mod cues for continued correct pursed lip breathing.  Pt rested on low love seat in apt with noted increased "belly" breathing, therefore provided cues and demonstration for increased diaphragmatic breathing.  Pt able to perform inconsistently during session.  Performed 3 reps of sit <> stand from low surface with mod assist initially then progressing min assist with cues and facilitation for increased forward weight shift and inhaling prior to activity then exhaling while standing (most difficult part of activity) due to pt stating he felt dizzy/wozzy following reps.   Assisted back to chair via stand pivot as stated above and back to room into recliner with all needs in place and two daughters present to assist with needs as well.   Therapy Documentation Precautions:  Precautions Precautions: Sternal;Fall Precaution Comments: Syncope with PT. Restrictions Weight Bearing Restrictions: No   Vital Signs: Therapy Vitals BP: 134/74 mmHg Pain: Pain Assessment Pain Score: 3  Pain Type: Surgical pain Pain Location: Chest Pain Orientation: Mid Pain Descriptors / Indicators: Dull (Pt states that this is a preexisting pain after surgery) Pain Frequency: Intermittent Pain Onset: Gradual   Locomotion : Ambulation Ambulation/Gait Assistance: 4: Min assist;4: Min guard   See FIM for current functional status  Therapy/Group: Individual Therapy  Vista Deck 10/29/2013, 11:53 AM

## 2013-10-29 NOTE — Progress Notes (Signed)
Occupational Therapy Session Note  Patient Details  Name: Antonio Bernard MRN: [id number] Date of Birth: [birth year]  Today's Date: 10/29/2013 Time: 0800-0900 Time Calculation (min): 60 min  Short Term Goals: Week 1:   See LTGs  Skilled Therapeutic Interventions/Progress Updates:    Pt reporting a rough night and didn't sleep much.  BP taken in supine at 112/70 and then after sitting 105/65.  Pt agreed to sponge bathe at the sink this session as he has been reporting some dizziness earlier in the morning.  Pt worked on shaving while sitting in the wheelchair with min assist for thoroughness.  Oxygen sats 97% on 4Ls nasal cannula during session.  Transferred to the toilet with min assist as well using the RW.  Pt able to urinate but unable to have a bowel movement.  Returned to wheelchair at bedside to rest for next session.  Therapist assisted with LB selfcare this am secondary to pt not feeling well and also not having enough time.    Therapy Documentation Precautions:  Precautions Precautions: Sternal;Fall Precaution Comments: Syncope with PT. Restrictions Weight Bearing Restrictions: No  Pain: Pain Assessment Pain Score: 3  Pain Type: Surgical pain Pain Location: Chest Pain Orientation: Mid Pain Descriptors / Indicators: Dull (Pt states that this is a preexisting pain after surgery) Pain Frequency: Intermittent Pain Onset: Gradual ADL: ADL Upper Body Bathing: Minimal assistance Where Assessed-Upper Body Bathing: Sitting at sink Lower Body Bathing: Maximal assistance Where Assessed-Lower Body Bathing: Sitting at sink Upper Body Dressing: Minimal assistance Where Assessed-Upper Body Dressing: Edge of bed Lower Body Dressing: Moderate assistance;Maximal assistance Where Assessed-Lower Body Dressing: Sitting at sink;Standing at sink  See FIM for current functional status  Therapy/Group: Individual Therapy  Peityn Payton OTR/L 10/29/2013, 12:17 PM

## 2013-10-29 NOTE — Consult Note (Signed)
INITIAL DIAGNOSTIC EVALUATION - [facility]   MEDICAL NECESSITY:  Mr. Albino Bufford was seen on the [facility] for an initial diagnostic evaluation owing to the patient's diagnosis of physical deconditioning.   According to medical records, Mr. Deese was admitted to the rehab unit owing to functional deficits secondary to deconditioning after recent CABG. Records indicated that he was admitted on 10/05/13 with STEMI. He underwent urgent cardiac catheterization by Dr. Herbie Baltimore that demonstrated an occlusion of the LAD. He was allowed to recover from MI and underwent CABG on 10/08/13. He has required pressors and transient atrial fibrillation was treated with amiodarone. Patient was refusing Life Vest due to "claustrophobia" but was then willing to try it for a period of time.   During this visit, Mr. Kloster denied experiencing any major cognitive difficulties post myocardial infarction and subsequent CABG. He described his current mood as okay, but admitted to situation anxiety and being prone to panic attacks. His daughter said that he had a couple of panic attacks while admitted but they were during high stress times; particularly during the transition from ICU to rehab. He was also anxious about using the Life Vest owing to claustrophobia, but he is compliant now and said that it was "not as bad as [he] thought it would be" once he had it on. Fortunately, Mr. Limas anxiety has not been a barrier to therapy and he is motivated to get better. Of note, the patient and his family also reported that he has fainted several times. They feel that this has been due to medication side effects. His medical providers are purportedly monitoring this situation.    Mr. Rosengren has a good social support system. He has operated as a Education officer, environmental for several decades and he is anxious to get back to his church. He feels that he is making strides  in therapy and had nothing but great things to say about his treatment team.   PROCEDURES ADMINISTERED: [1 unit [phone] on 10/28/13] Diagnostic clinical interview  Review of available records  Emotional & Behavioral Evaluation: Mr. Wambold was appropriately dressed for season and situation, and he appeared tidy and well-groomed. Normal posture was noted. He was friendly and rapport was easily established. His speech was as expected and he was able to express ideas effectively. His affect was appropriately modulated. Attention and motivation were good.  From an emotional standpoint, Mr. Zeiss denied experiencing any significant signs of depression. He admits to anxiety and panic attacks when he gets overwhelmed. He also suffers from claustrophobia. He denies any issues adjusting to this hospital admission with the exception of mild discomfort owing to the fact that others must attend to certain aspects of his basic self-care. Suicidal/homicidal ideation, plan or intent was denied. No manic or hypomanic episodes were reported. The patient denied ever experiencing any auditory/visual hallucinations. No major behavioral or personality changes were endorsed.    Overall, Mr. Bickford reports suffering from (and being treated for) longstanding anxiety and panic attacks via an anxiolytic that he finds to be helpful. Fortunately, his mood has not impacted therapy in any way. From a cognitive standpoint, he and his family deny noticing any adverse changes subsequent to his present medical situation. At this time, the plan is to follow up with the patient as needed. Continued treatment via Xanax is warranted.  DIAGNOSES:  Physical deconditioning Anxiety disorder   Debbe Mounts, Psy.D.  Clinical Neuropsychologist

## 2013-10-29 NOTE — Progress Notes (Signed)
Occupational Therapy Session Note  Patient Details  Name: Antonio Bernard MRN: [id number] Date of Birth: March 19, 1933  Today's Date: 10/29/2013 Time: 4010-2725 Time Calculation (min): 46 min  Skilled Therapeutic Interventions/Progress Updates:    Took pt down to the gym to work on standing endurance and sit to stand adhering to sternal precautions.  Pt was able to perform sit to stand from the wheelchair with min guard assist.  He was able to perform 4 intervals of standing with overall min guard assist to maintain standing balance while dividing attention and playing game.  Standing intervals ranged from 2 1/2 mins to 4 1/2 mins before needing to sit down and rest.  Pt's sats 95% on 4Ls nasal cannula after standing interval of 3 1/2 mins, with HR 87.  Returned to his room in the wheelchair at end of session.    Therapy Documentation Precautions:  Precautions Precautions: Sternal;Fall Precaution Comments: Syncope with PT. Restrictions Weight Bearing Restrictions: No  Vital Signs: Therapy Vitals BP: 106/67 mmHg Patient Position, if appropriate: Sitting Oxygen Therapy SpO2: 97 % O2 Device: Nasal cannula O2 Flow Rate (L/min): 4 L/min Pulse Oximetry Type: Intermittent Pain: Pain Assessment Pain Score: 6   See FIM for current functional status  Therapy/Group: Individual Therapy  Dura Mccormack OTR/L 10/29/2013, 2:29 PM

## 2013-10-29 NOTE — Progress Notes (Signed)
Physical Therapy Session Note  Patient Details  Name: Antonio Bernard MRN: [id number] Date of Birth: 1933-05-03  Today's Date: 10/29/2013 Time: 1410-1430 Time Calculation (min): 20 min  Short Term Goals: Week 1:     Skilled Therapeutic Interventions/Progress Updates:   pt received sitting in w/c this afternoon.  MD in room discussing swelling in LEs and need to increase Lasix via IV later this afternoon.  Focus of session was gait training with RW for improved quality of gait and also to increase pts activity tolerance.  See details below.  Ambulated on 4L O2 with SaO2 in 90's throughout session.  Note somewhat improved activity tolerance today, however did feel somewhat dizzy after last rep of gait training.  Assisted back to bed to allow pt to rest.  Requires mod assist for elevating BLEs into bed. Once in bed, note pt states he feels somewhat dizzy and note increased WOB.  Feel that he also gets very anxious about breathing and position changes, increasing his WOB during session. Left in bed with all needs in reach and bed alarm set.  Daughter present during session.    Therapy Documentation Precautions:  Precautions Precautions: Sternal;Fall Precaution Comments: Syncope with PT. Restrictions Weight Bearing Restrictions: No General: Amount of Missed PT Time (min): 10 Minutes Missed Time Reason:  (MD speaking with pt regarding Lasix) Vital Signs: Therapy Vitals Temp: 97.6 F (36.4 C) Temp src: Oral Pulse Rate: 77 Resp: 20 BP: 126/74 mmHg Patient Position, if appropriate: Lying Oxygen Therapy SpO2: 95 % O2 Device: Nasal cannula O2 Flow Rate (L/min): 4 L/min Pulse Oximetry Type: Intermittent Pain: Pt with no c/o pain during session .   Locomotion : Ambulation Ambulation: Yes Ambulation/Gait Assistance: 4: Min guard Ambulation Distance (Feet): 50 Feet (then another 50', then another 75') Assistive device: Rolling walker Ambulation/Gait Assistance Details: Verbal  cues for technique;Verbal cues for precautions/safety Ambulation/Gait Assistance Details: Continue to perform gait training in hallway with use of RW for increased activity tolerance.  Continues to require max verbal cues for correct technique for pursed lip breathing during gait training.  Gait Gait: Yes Gait Pattern: Step-through pattern;Decreased stride length;Wide base of support   See FIM for current functional status  Therapy/Group: Individual Therapy  Vista Deck 10/29/2013, 5:04 PM

## 2013-10-29 NOTE — Progress Notes (Signed)
Advanced Heart Failure Rounding Note   Subjective:     [name] is a 79 yo male with a hx of HTN, severe anxiety, and OSA who presented to the ED 10/05/13 with a STEMI and was taken to the cath lab. Cath revealed proximally occluded LAD - after flow was restored it was apparently there was extensive LAD disease as well as severe 90-95% ostial D1 disease . There was moderate to severe focal disease of the sub-branches as well. Balloon angioplasty was performed, but no stent was placed. TIMI 2 flow was noted in the LAD and an IABP was placed. He was then evaluated by TCTS and was taken for a CABG x2 LIMA-LAD; SVG-DIAG on 10/08/13. After CABG he was brought back to the ICU still on IABP and multiple pressors. The balloon pump was weaned off 10/10/13 and he was extubated 10/13/13. Patient developed transient afib on 11/11 and was started on Amiodarone gtt.   11/11/4 Venous doppler acute deep DVT involving R peroneal vein 10/15/13 EF 30-35%. RV ok.  10/24/13 ECHO EF 25% RV ok. Lifevest placed.  Transferred to CIR 10/24/13  Met with psychologist this am and recommends continued treatment of xanax. Weight up 8 lbs and he reports more SOB today. + orthopnea. Denies CP.   Objective:   Weight Range:  Vital Signs:   Temp:  [97.6 F (36.4 C)-97.9 F (36.6 C)] 97.9 F (36.6 C) (11/25 0443) Pulse Rate:  [69-80] 77 (11/25 0730) Resp:  [18] 18 (11/25 0443) BP: (112-134)/(50-80) 134/74 mmHg (11/25 0916) SpO2:  [96 %-99 %] 97 % (11/25 0443) Weight:  [251 lb 6.4 oz (114.034 kg)] 251 lb 6.4 oz (114.034 kg) (11/25 0443) Last BM Date: 10/26/13  Weight change: Filed Weights   10/27/13 0455 10/28/13 0501 10/29/13 0443  Weight: 248 lb 10.9 oz (112.8 kg) 243 lb 12.8 oz (110.587 kg) 251 lb 6.4 oz (114.034 kg)    Intake/Output:   Intake/Output Summary (Last 24 hours) at 10/29/13 1111 Last data filed at 10/29/13 0900  Gross per 24 hour  Intake    600 ml  Output    450 ml  Net    150 ml     Physical  Exam: General:   NAD; sitting in wheelchair HEENT: normal Neck: supple. JVP 8-9. Carotids 2+ bilat; no bruits. No lymphadenopathy or thryomegaly appreciated. Cor: PMI nondisplaced. Regular rate & rhythm. No rubs, gallops or murmurs.Sternal incision approximated.  Lungs: clear Abdomen: soft, nontender, non-distended. No hepatosplenomegaly. No bruits or masses. Good bowel sounds. Extremities: no cyanosis, clubbing, rash, no edema. Bilateral ted hose. BLE 2+ edema; TED hose; RUE triple lumen picc Neuro: alert & orientedx3, cranial nerves grossly intact. moves all 4 extremities w/o difficulty. Affect pleasant   Labs: Basic Metabolic Panel:  Recent Labs Lab 10/26/13 0415 10/27/13 0500 10/27/13 1515 10/28/13 0530 10/29/13 0500  NA 131* 131* 127* 129* 128*  K 3.8 3.6 4.3 3.7 3.6  CL 88* 87* 85* 86* 85*  CO2 34* 35* 32 33* 33*  GLUCOSE 100* 81 114* 93 94  BUN 44* 47* 53* 54* 57*  CREATININE 1.68* 1.43* 1.65* 1.63* 1.54*  CALCIUM 8.5 8.5 8.4 8.4 8.3*    Liver Function Tests: No results found for this basename: AST, ALT, ALKPHOS, BILITOT, PROT, ALBUMIN,  in the last 168 hours No results found for this basename: LIPASE, AMYLASE,  in the last 168 hours No results found for this basename: AMMONIA,  in the last 168 hours  CBC:  Recent Labs Lab  10/25/13 0450 10/26/13 0415 10/27/13 0500 10/28/13 0530 10/29/13 0500  WBC 10.6* 10.6* 9.7 9.3 8.2  HGB 9.2* 8.9* 8.7* 8.5* 8.8*  HCT 28.3* 26.7* 26.9* 26.6* 27.2*  MCV 92.2 91.4 91.8 91.7 91.9  PLT 363 356 404* 401* 434*    Cardiac Enzymes:  Recent Labs Lab 10/27/13 1514  TROPONINI <0.30    BNP: BNP (last 3 results)  Recent Labs  10/07/13 0400 10/17/13 0415  PROBNP 3092.0* 30734.0*    Imaging: Dg Chest Port 1 View  10/27/2013   CLINICAL DATA:  Short of breath  EXAM: PORTABLE CHEST - 1 VIEW  COMPARISON:  10/23/2013  FINDINGS: Progress of bilateral airspace disease consistent with pulmonary edema. Increase in bilateral  effusion and bibasilar atelectasis.  Right arm PICC tip in the SVC unchanged.  IMPRESSION: Progressive heart failure with edema and bilateral pleural effusions.   Electronically Signed   By: Marlan Palau M.D.   On: 10/27/2013 16:43     Medications:     Scheduled Medications: . ALPRAZolam  0.5-0.75 mg Oral QHS  . aspirin EC  81 mg Oral Daily  . atorvastatin  80 mg Oral q1800  . bisacodyl  10 mg Oral Daily   Or  . bisacodyl  10 mg Rectal Daily  . budesonide (PULMICORT) nebulizer solution  0.25 mg Nebulization BID  . carvedilol  3.125 mg Oral BID WC  . docusate sodium  200 mg Oral Daily  . feeding supplement (PRO-STAT SUGAR FREE 64)  30 mL Oral TID WC  . furosemide  40 mg Oral Daily  . insulin aspart  0-15 Units Subcutaneous TID WC  . lactose free nutrition  237 mL Oral TID WC  . levalbuterol  1.25 mg Nebulization BID  . pantoprazole  80 mg Oral Q1200  . potassium chloride  20 mEq Oral Daily  . saccharomyces boulardii  250 mg Oral BID  . spironolactone  25 mg Oral Daily  . Warfarin - Pharmacist Dosing Inpatient   Does not apply q1800    Infusions:    PRN Medications: ALPRAZolam, alum & mag hydroxide-simeth, guaiFENesin-dextromethorphan, levalbuterol, ondansetron (ZOFRAN) IV, ondansetron, oxyCODONE, sodium chloride, traMADol   Assessment:   1) CAD   S/p CABG x 2 10/08/13 2) STEMI 3) A/C systolic HF    EF 30-35% 4) HTN 5) OSA 6) Severe Anxiety 7) Respiratory failure,   -- vent-mask 50% 8) Afib 9) AKI 10) DVT   -- R peroneal vein   -- on lovenox and coumadin 11) Hypokalemia/hyponatremia  Plan/Discussion:    Doing fairly well, however volume status up. He is up 7 lbs overnight. Will give 80 mg IV lasix today along with 40 meq of potassium.   He is still having some mild dizziness, will hold on titrating any HF meds at this time.   Can switch from coumadin to xarelto for DVT once INR less than 2.0. Would recommend at least 6 months of treatment, will leave to  primary team. Can start with 20 mg daily and not use the load.  Antonio Potash B,NP-C 11:11 AM  Patient seen and examined with Antonio Potash, NP. We discussed all aspects of the encounter. I agree with the assessment and plan as stated above.   He is volume overloaded. Will restart IV lasix.   Suspect syncopal episodes may be neurocardiogenic in nature. Have requested orthostatic vital signs again.  Agree with Xarelto once INR < 20.   Antonio Childers,MD 3:04 PM

## 2013-10-30 ENCOUNTER — Inpatient Hospital Stay (HOSPITAL_COMMUNITY): Payer: Medicare Other

## 2013-10-30 ENCOUNTER — Inpatient Hospital Stay (HOSPITAL_COMMUNITY): Payer: Medicare (Managed Care) | Admitting: Occupational Therapy

## 2013-10-30 ENCOUNTER — Inpatient Hospital Stay (HOSPITAL_COMMUNITY): Payer: Medicare Other | Admitting: Physical Therapy

## 2013-10-30 ENCOUNTER — Encounter (HOSPITAL_COMMUNITY): Payer: Medicare (Managed Care)

## 2013-10-30 LAB — PROTIME-INR
INR: 3.99 — ABNORMAL HIGH (ref 0.00–1.49)
Prothrombin Time: 37.4 seconds — ABNORMAL HIGH (ref 11.6–15.2)

## 2013-10-30 LAB — CBC
MCH: 29.7 pg (ref 26.0–34.0)
Platelets: 463 10*3/uL — ABNORMAL HIGH (ref 150–400)
RBC: 3.16 MIL/uL — ABNORMAL LOW (ref 4.22–5.81)
WBC: 7.5 10*3/uL (ref 4.0–10.5)

## 2013-10-30 LAB — BASIC METABOLIC PANEL
CO2: 30 mEq/L (ref 19–32)
Calcium: 8.6 mg/dL (ref 8.4–10.5)
GFR calc Af Amer: 47 mL/min — ABNORMAL LOW (ref >90)
GFR calc non Af Amer: 41 mL/min — ABNORMAL LOW (ref >90)
Glucose, Bld: 95 mg/dL (ref 70–99)
Potassium: 4.3 mEq/L (ref 3.5–5.1)
Sodium: 130 mEq/L — ABNORMAL LOW (ref 135–145)

## 2013-10-30 LAB — GLUCOSE, CAPILLARY
Glucose-Capillary: 137 mg/dL — ABNORMAL HIGH (ref 70–99)
Glucose-Capillary: 109 mg/dL — ABNORMAL HIGH (ref 70–99)

## 2013-10-30 LAB — CARBOXYHEMOGLOBIN
O2 Saturation: 46.4 %
Total hemoglobin: 9.1 g/dL — ABNORMAL LOW (ref 13.5–18.0)

## 2013-10-30 MED ORDER — ENSURE COMPLETE PO LIQD
237.0000 mL | Freq: Two times a day (BID) | ORAL | Status: DC
  Administered 2013-10-30 – 2013-10-31 (×2): 237 mL via ORAL

## 2013-10-30 MED ORDER — LEVALBUTEROL HCL 1.25 MG/0.5ML IN NEBU
1.2500 mg | INHALATION_SOLUTION | Freq: Four times a day (QID) | RESPIRATORY_TRACT | Status: DC
  Filled 2013-10-30 (×5): qty 0.5

## 2013-10-30 MED ORDER — FUROSEMIDE 10 MG/ML IJ SOLN
40.0000 mg | Freq: Once | INTRAMUSCULAR | Status: AC
  Administered 2013-10-30: 40 mg via INTRAVENOUS
  Filled 2013-10-30: qty 4

## 2013-10-30 MED ORDER — POTASSIUM CHLORIDE CRYS ER 20 MEQ PO TBCR
20.0000 meq | EXTENDED_RELEASE_TABLET | Freq: Once | ORAL | Status: AC
  Administered 2013-10-30: 20 meq via ORAL
  Filled 2013-10-30: qty 1

## 2013-10-30 MED ORDER — FUROSEMIDE 10 MG/ML IJ SOLN: 80.0000 mg | Freq: Once | INTRAMUSCULAR | Status: DC

## 2013-10-30 MED ORDER — LEVALBUTEROL HCL 1.25 MG/0.5ML IN NEBU
1.2500 mg | INHALATION_SOLUTION | Freq: Four times a day (QID) | RESPIRATORY_TRACT | Status: DC
  Administered 2013-10-30 – 2013-10-31 (×5): 1.25 mg via RESPIRATORY_TRACT
  Filled 2013-10-30 (×8): qty 0.5

## 2013-10-30 NOTE — Progress Notes (Signed)
Subjective/Complaints: 79 y.o. male with history of HTN, obesity, OSA, anxiety disorder; who was admitted on 10/05/13 with STEMI. he underwent urgent cardiac catheterization by Dr. Herbie [place] that demonstrated a occlusion of the LAD which was opened with PTCA and IA:PB placed to help flow through LAD. He was allowed to recover from MI and underwent CABG X 1 on 10/08/13 and extubated on 10/13/13. Has required pressors and transient A fib treated with amiodarone. He had decrease in UOP with fluid overload due to decompensated CHF, hypoxia and AKI. Dr. Lorie Phenix consulted for input and recommended increasing diuresis as well as weaning off of dopamine. LE dopplers done due to hypoxia and right peroneal vein DVT noted and patient started on heparin for treatment.  Renal failure slowly improving and urine output improved with improvement in respiratory status. He has had syncope due to orthostasis and started on midodrine for BP support. Has been refusing CPAP. He continues to have SOB and diuretics being adjusted to help with fluid overload as well as orthostatic symptoms. LifeVest recommended due to ICM--repeat echo with severe hypokinesis and septal and apex akinesis with EF 25%. done. Patient refusing Life Vest due to "claustophobia" but willing to try for a period of time. Patient deconditioned and CIR recommended by MD and therapy team.   No further Syncopal episodes.  History of orthostatic events. Appreciate cardiac note Breathing improved after diuresis however still complains of fatigue. Appreciate cardiology no  Review of Systems - Negative except Fatigue, shortness of breath with exertion, anxiety  Objective: Vital Signs: Blood pressure 133/75, pulse 75, temperature 97 F (36.1 C), temperature source Oral, resp. rate 20, height 6\' 1"  (1.854 m), weight 112.537 kg (248 lb 1.6 oz), SpO2 98.00%. Dg Chest 2 View  10/30/2013   CLINICAL DATA:  Pleural effusion.  Shortness of breath.  EXAM: CHEST  2 VIEW   COMPARISON:  Chest x-ray 10/27/2013.  FINDINGS: There is a right upper extremity PICC with tip terminating in the mid to distal superior vena cava. External pacemaker/ defibrillator overlies the thorax. Lung volumes are low. There is cephalization of the pulmonary vasculature, indistinctness of the interstitial markings, and patchy airspace disease throughout the lungs bilaterally suggestive of moderate pulmonary edema. Moderate bilateral pleural effusions. Mild cardiomegaly. Upper mediastinal contours are within limits. Atherosclerosis in the thoracic status post median sternotomy  IMPRESSION: 1. Findings again compatible with congestive heart failure, as discussed above. Moderate bilateral pleural effusions appear slightly larger, but there has otherwise been little change compared with examination from 10/27/2013. 2. Atherosclerosis. 3. Support apparatus and postoperative changes, as above.   Electronically Signed   By: Trudie Reed M.D.   On: 10/30/2013 08:24   Results for orders placed during the hospital encounter of 10/25/13 (from the past 72 hour(s))  BASIC METABOLIC PANEL     Status: Abnormal   Collection Time    10/28/13  5:30 AM      Result Value Range   Sodium 129 (*) 135 - 145 mEq/L   Potassium 3.7  3.5 - 5.1 mEq/L   Chloride 86 (*) 96 - 112 mEq/L   CO2 33 (*) 19 - 32 mEq/L   Glucose, Bld 93  70 - 99 mg/dL   BUN 54 (*) 6 - 23 mg/dL   Creatinine, Ser 1.19 (*) 0.50 - 1.35 mg/dL   Calcium 8.4  8.4 - 14.7 mg/dL   GFR calc non Af Amer 39 (*) >90 mL/min   GFR calc Af Amer 45 (*) >90 mL/min  Comment: (NOTE)     The eGFR has been calculated using the CKD EPI equation.     This calculation has not been validated in all clinical situations.     eGFR's persistently <90 mL/min signify possible Chronic Kidney     Disease.  CBC     Status: Abnormal   Collection Time    10/28/13  5:30 AM      Result Value Range   WBC 9.3  4.0 - 10.5 K/uL   RBC 2.90 (*) 4.22 - 5.81 MIL/uL   Hemoglobin  8.5 (*) 13.0 - 17.0 g/dL   HCT 40.9 (*) 81.1 - 91.4 %   MCV 91.7  78.0 - 100.0 fL   MCH 29.3  26.0 - 34.0 pg   MCHC 32.0  30.0 - 36.0 g/dL   RDW 78.2  95.6 - 21.3 %   Platelets 401 (*) 150 - 400 K/uL  PROTIME-INR     Status: Abnormal   Collection Time    10/28/13  5:30 AM      Result Value Range   Prothrombin Time 40.0 (*) 11.6 - 15.2 seconds   INR 4.36 (*) 0.00 - 1.49  GLUCOSE, CAPILLARY     Status: Abnormal   Collection Time    10/28/13  1:01 PM      Result Value Range   Glucose-Capillary 125 (*) 70 - 99 mg/dL  GLUCOSE, CAPILLARY     Status: Abnormal   Collection Time    10/28/13  4:29 PM      Result Value Range   Glucose-Capillary 105 (*) 70 - 99 mg/dL   Comment 1 Notify RN    GLUCOSE, CAPILLARY     Status: Abnormal   Collection Time    10/28/13  8:44 PM      Result Value Range   Glucose-Capillary 120 (*) 70 - 99 mg/dL  BASIC METABOLIC PANEL     Status: Abnormal   Collection Time    10/29/13  5:00 AM      Result Value Range   Sodium 128 (*) 135 - 145 mEq/L   Potassium 3.6  3.5 - 5.1 mEq/L   Chloride 85 (*) 96 - 112 mEq/L   CO2 33 (*) 19 - 32 mEq/L   Glucose, Bld 94  70 - 99 mg/dL   BUN 57 (*) 6 - 23 mg/dL   Creatinine, Ser 0.86 (*) 0.50 - 1.35 mg/dL   Calcium 8.3 (*) 8.4 - 10.5 mg/dL   GFR calc non Af Amer 42 (*) >90 mL/min   GFR calc Af Amer 48 (*) >90 mL/min   Comment: (NOTE)     The eGFR has been calculated using the CKD EPI equation.     This calculation has not been validated in all clinical situations.     eGFR's persistently <90 mL/min signify possible Chronic Kidney     Disease.  CBC     Status: Abnormal   Collection Time    10/29/13  5:00 AM      Result Value Range   WBC 8.2  4.0 - 10.5 K/uL   RBC 2.96 (*) 4.22 - 5.81 MIL/uL   Hemoglobin 8.8 (*) 13.0 - 17.0 g/dL   HCT 57.8 (*) 46.9 - 62.9 %   MCV 91.9  78.0 - 100.0 fL   MCH 29.7  26.0 - 34.0 pg   MCHC 32.4  30.0 - 36.0 g/dL   RDW 52.8  41.3 - 24.4 %   Platelets 434 (*) 150 - 400  K/uL   PROTIME-INR     Status: Abnormal   Collection Time    10/29/13  5:00 AM      Result Value Range   Prothrombin Time 34.3 (*) 11.6 - 15.2 seconds   INR 3.56 (*) 0.00 - 1.49  GLUCOSE, CAPILLARY     Status: Abnormal   Collection Time    10/29/13  7:18 AM      Result Value Range   Glucose-Capillary 113 (*) 70 - 99 mg/dL   Comment 1 Notify RN    GLUCOSE, CAPILLARY     Status: Abnormal   Collection Time    10/29/13 11:37 AM      Result Value Range   Glucose-Capillary 106 (*) 70 - 99 mg/dL   Comment 1 Notify RN    GLUCOSE, CAPILLARY     Status: Abnormal   Collection Time    10/29/13  4:36 PM      Result Value Range   Glucose-Capillary 104 (*) 70 - 99 mg/dL   Comment 1 Notify RN    GLUCOSE, CAPILLARY     Status: Abnormal   Collection Time    10/29/13  8:58 PM      Result Value Range   Glucose-Capillary 108 (*) 70 - 99 mg/dL   Comment 1 Notify RN    BASIC METABOLIC PANEL     Status: Abnormal   Collection Time    10/30/13  5:30 AM      Result Value Range   Sodium 130 (*) 135 - 145 mEq/L   Potassium 4.3  3.5 - 5.1 mEq/L   Chloride 88 (*) 96 - 112 mEq/L   CO2 30  19 - 32 mEq/L   Glucose, Bld 95  70 - 99 mg/dL   BUN 64 (*) 6 - 23 mg/dL   Creatinine, Ser 9.56 (*) 0.50 - 1.35 mg/dL   Calcium 8.6  8.4 - 21.3 mg/dL   GFR calc non Af Amer 41 (*) >90 mL/min   GFR calc Af Amer 47 (*) >90 mL/min   Comment: (NOTE)     The eGFR has been calculated using the CKD EPI equation.     This calculation has not been validated in all clinical situations.     eGFR's persistently <90 mL/min signify possible Chronic Kidney     Disease.  CBC     Status: Abnormal   Collection Time    10/30/13  5:30 AM      Result Value Range   WBC 7.5  4.0 - 10.5 K/uL   RBC 3.16 (*) 4.22 - 5.81 MIL/uL   Hemoglobin 9.4 (*) 13.0 - 17.0 g/dL   HCT 08.6 (*) 57.8 - 46.9 %   MCV 92.7  78.0 - 100.0 fL   MCH 29.7  26.0 - 34.0 pg   MCHC 32.1  30.0 - 36.0 g/dL   RDW 62.9  52.8 - 41.3 %   Platelets 463 (*) 150 - 400  K/uL  PROTIME-INR     Status: Abnormal   Collection Time    10/30/13  5:30 AM      Result Value Range   Prothrombin Time 37.4 (*) 11.6 - 15.2 seconds   INR 3.99 (*) 0.00 - 1.49  GLUCOSE, CAPILLARY     Status: None   Collection Time    10/30/13  8:12 AM      Result Value Range   Glucose-Capillary 90  70 - 99 mg/dL   Comment 1 Notify RN    GLUCOSE,  CAPILLARY     Status: Abnormal   Collection Time    10/30/13 11:29 AM      Result Value Range   Glucose-Capillary 137 (*) 70 - 99 mg/dL   Comment 1 Notify RN        Nursing note and vitals reviewed.  Constitutional: He appears well-developed and well-nourished. Alert and comfortable  HENT: oral mucosa pink and moist. Dentition fair  Head: Normocephalic.  Eyes: Conjunctivae are normal. Pupils are equal, round, and reactive to light.  Cardiovascular: Normal rate. Normal rhythm, no murmurs  Respiratory: Effort normal. Chest clear without wheezes rales or rhonchi  GI: Soft.  Musculoskeletal:  Chest wall, legs slightly tender  Neurological:  Reasonable insight and awareness. Strength 3+ deltoids and biceps, triceps, to 4/5 distally at wrist and hands. HF's 3, KE 3+, ankles 4/5. No gross sensory deficits.pt is alert and appropriate.  Skin:  Chest incision, legs clean and intact, still somewhat tender  Psychiatric: He has a normal mood and affect. His behavior is normal.   Assessment/Plan: 1. Functional deficits secondary to Deconditioning after CABG which require 3+ hours per day of interdisciplinary therapy in a comprehensive inpatient rehab setting. Physiatrist is providing close team supervision and 24 hour management of active medical problems listed below. Physiatrist and rehab team continue to assess barriers to discharge/monitor patient progress toward functional and medical goals. FIM: FIM - Bathing Bathing Steps Patient Completed: Chest;Right Arm;Left Arm;Abdomen;Front perineal area;Right upper leg;Left upper leg Bathing: 3:  Mod-Patient completes 5-7 79f 10 parts or 50-74%  FIM - Upper Body Dressing/Undressing Upper body dressing/undressing steps patient completed: Thread/unthread right sleeve of front closure shirt/dress;Thread/unthread left sleeve of front closure shirt/dress;Pull shirt around back of front closure shirt/dress;Button/unbutton shirt Upper body dressing/undressing: 0: Wears gown/pajamas-no public clothing FIM - Lower Body Dressing/Undressing Lower body dressing/undressing steps patient completed: Thread/unthread right pants leg;Thread/unthread left pants leg;Pull pants up/down Lower body dressing/undressing: 2: Max-Patient completed 25-49% of tasks  FIM - Toileting Toileting steps completed by patient: Adjust clothing prior to toileting Toileting: 2: Max-Patient completed 1 of 3 steps  FIM - Diplomatic Services operational officer Devices: Environmental consultant;Bedside commode;Grab bars Toilet Transfers: 4-To toilet/BSC: Min A (steadying Pt. > 75%);4-From toilet/BSC: Min A (steadying Pt. > 75%)  FIM - Bed/Chair Transfer Bed/Chair Transfer Assistive Devices: Therapist, occupational: 4: Supine > Sit: Min A (steadying Pt. > 75%/lift 1 leg);4: Bed > Chair or W/C: Min A (steadying Pt. > 75%)  FIM - Locomotion: Wheelchair Locomotion: Wheelchair: 1: Total Assistance/staff pushes wheelchair (Pt<25%) FIM - Locomotion: Ambulation Locomotion: Ambulation Assistive Devices: Designer, industrial/product Ambulation/Gait Assistance: 4: Min guard Locomotion: Ambulation: 2: Travels 50 - 149 ft with minimal assistance (Pt.>75%)  Comprehension Comprehension Mode: Auditory Comprehension: 5-Understands basic 90% of the time/requires cueing < 10% of the time  Expression Expression Mode: Verbal Expression: 6-Expresses complex ideas: With extra time/assistive device  Social Interaction Social Interaction: 5-Interacts appropriately 90% of the time - Needs monitoring or encouragement for participation or interaction.  Problem  Solving Problem Solving: 4-Solves basic 75 - 89% of the time/requires cueing 10 - 24% of the time  Memory Memory: 5-Recognizes or recalls 90% of the time/requires cueing < 10% of the time  Medical Problem List and Plan:  Deconditioning due to multiple medical issues past CABG  1. R peroneal DVT: Pharmaceutical: Coumadin  2. Pain Management: Will continue oxycodone prn--not using any at this time. Will monitor for now.  3. Anxiety disorder with panic attacks/Mood: Continue Xanax. Pt would like xanax  at a schedule which is closer to his home regimen. ---will give prn during the day and 0.5 to.75mg  at bedtime.  -Team to offer ego support and encouragement. LCSW to follow for evaluation.  4. Neuropsych: This patient is capable of making decisions on his own behalf.  5. Hypotension: Off midodrine. Monitor for tolerance with increase in activity.  6. A fib: resolved and stable off amiodarone.  7. OSA: Non compliant/intolerant of CPAP--continue to encourage use. Hypoxia resolved but back on oxygen now.  8. Acute on chronic systolic CHF: Modified diet. Check weights daily. Continue lasix, amiodarone, lasix and corvedilol--Heart failure team to assist with management. carboxyHgb ordered 9. ICM: Fitted with life vest  10. Acute kidney injury: Resolving. Continue to monitor lytes and urine output.  11. ABLA: Baseline Hgb 13.9 but low iron stores noted.--will supplement. Has had slow but steady downward trend in H/H. Monitor for signs of bleeding with coumadin on board. Check stool guaiacs. Check H/H twice a week. Stable today 12. Abnormal LFTs: likely medication related.  13. Stress induced hyperglycemia: Baseline Hgb A1c --5.5.   LOS (Days) 5 A FACE TO FACE EVALUATION WAS PERFORMED  KIRSTEINS,ANDREW E 10/30/2013, 3:56 PM

## 2013-10-30 NOTE — Progress Notes (Signed)
Occupational Therapy Session Note  Patient Details  Name: Antonio Bernard MRN: [id number] Date of Birth: Oct 28, 1933  Today's Date: 10/30/2013 Time: 3086-5784 Time Calculation (min): 60 min  Short Term Goals: Week 1:     Skilled Therapeutic Interventions/Progress Updates:    Pt resting in recliner with daughter at side upon arrival.  Pt initially stated he was exhausted but after encouragement from therapist and daughter agreed to bathing and dressing w/c level at sink.  Pt requested use of toilet and performed toilet transfer with min A and verbal cues to adhere to sternal precautions. Pt required assistance with toileting tasks.  Pt required multiple rest breaks throughout session and frequently commented that "it takes so much energy to complete the simplest of tasks."  Pt O2 stats>90% on 4L O2 throughout session although pt became SOB easily necessitating rest breaks.  Pt performed all sit<>stand with steady A/min A and maintained standing balance with steady A.  Focus on activity tolerance, standing balance, and safety awareness.  Therapy Documentation Precautions:  Precautions Precautions: Sternal;Fall Precaution Comments: Syncope with PT. Restrictions Weight Bearing Restrictions: No Pain: Pain Assessment Pain Assessment: No/denies pain  See FIM for current functional status  Therapy/Group: Individual Therapy  Rich Brave 10/30/2013, 9:47 AM

## 2013-10-30 NOTE — Progress Notes (Signed)
Physical Therapy Note  Patient Details  Name: CALAN DOREN MRN: [id number] Date of Birth: [birth year] Today's Date: 10/30/2013  1300-1355 (30  minutes) individual (pt missed 25 minutes secondary to nursing issues and c/o fatigue)  Pain: no complaint of pain Other: Oxygen sats 91% on 4 L  [place] Focus of treatment:  Pt up in wc upon arrival ; sit to stand from wc hands on knees SBA; transfers stand/turn RW SBA; Nustep Level 2 X 10 minutes with 3 rest breaks; oxygen sats 88 recovering to 91% ; pt reports perceived exertion of hard and c/o fatigue; returned to room .   Sulamita Lafountain,JIM 10/30/2013, 1:21 PM

## 2013-10-30 NOTE — Progress Notes (Addendum)
ANTICOAGULATION CONSULT NOTE   Pharmacy Consult:  Coumadin>>xarelto Indication:  DVT  Allergies  Allergen Reactions  . Demerol [Meperidine] Other (See Comments)    "makes me crazy"  . Lisinopril Diarrhea  . Shellfish Allergy Rash    Patient Measurements: Height: 6\' 1"  (185.4 cm) Weight: 248 lb 1.6 oz (112.537 kg) IBW/kg (Calculated) : 79.9  Vital Signs: Temp: 97 F (36.1 C) (11/26 0621) Temp src: Oral (11/26 0621) BP: 133/75 mmHg (11/26 0653) Pulse Rate: 75 (11/26 0653)  Labs:  Recent Labs  10/27/13 1514  10/28/13 0530 10/29/13 0500 10/30/13 0530  HGB  --   < > 8.5* 8.8* 9.4*  HCT  --   --  26.6* 27.2* 29.3*  PLT  --   --  401* 434* 463*  LABPROT  --   --  40.0* 34.3* 37.4*  INR  --   --  4.36* 3.56* 3.99*  CREATININE  --   < > 1.63* 1.54* 1.57*  TROPONINI <0.30  --   --   --   --   < > = values in this interval not displayed.  Estimated Creatinine Clearance: 51.8 ml/min (by C-G formula based on Cr of 1.57).    Assessment: 79 YOM on Coumadin for DVT.  INR=3.9 up from yesterday and still supratherapeutic. No bleeding reported. New orders to transition patient to xarelto when INR falls. Studies recommend starting Xarelto when INR <3. Will hold anticoagulation for now and follow trend.  11/11/4 Venous doppler acute deep DVT involving R peroneal vein  Goal of Therapy:  INR 2-3 Monitor platelets by anticoagulation protocol: Yes    Plan:  D/C Coumadin  Start Xarelto when INR is down Continue Daily PT / INR for now  Sheppard Coil PharmD., BCPS Clinical Pharmacist Pager [phone] 10/30/2013 8:52 AM

## 2013-10-30 NOTE — Progress Notes (Signed)
Social Work Lucy Chris, LCSW Social Worker Signed  Patient Care Conference Service date: 10/30/2013 1:58 PM  Inpatient RehabilitationTeam Conference and Plan of Care Update Date: 10/30/2013   Time: 10:30 AM     Patient Name: Antonio Bernard       Medical Record Number: [id number]   Date of Birth: 01-Jun-1933 Sex: Male         Room/Bed: 4W26C/4W26C-01 Payor Info: Payor: MEDICARE / Plan: MEDICARE PART A AND B / Product Type: *No Product type* /   Admitting Diagnosis: gabg chf, life vest   Admit Date/Time:  10/25/2013  4:18 PM Admission Comments: No comment available   Primary Diagnosis:  Physical deconditioning Principal Problem: Physical deconditioning    Patient Active Problem List     Diagnosis  Date Noted   .  Syncope  10/28/2013   .  Hyponatremia  10/28/2013   .  Physical deconditioning  10/25/2013   .  Anemia associated with acute blood loss     .  Acute systolic heart failure  10/16/2013   .  Acute kidney injury  10/16/2013   .  S/P CABG x 2: LIMA-LAD, SVG-DIAG  10/10/2013       Class: Chronic   .  STEMI of anterior wall,  10/05/13  10/06/2013       Class: Hospitalized for   .  Essential hypertension  10/06/2013       Class: Chronic   .  Obesity (BMI 35.0-39.9 without comorbidity)  10/06/2013   .  S/P urgent LAD PTCA 10/06/13 with IABP for LAD perfusion  10/06/2013   .  Anxiety, claustrophobia  10/06/2013   .  DJD  lumbar- surg August 2013  10/06/2013   .  Sleep apnea- C-pap intol  10/06/2013   .  AAA (abdominal aortic aneurysm)-hx of a "small" AAA found July 2013 (nothing in EPIC)  10/06/2013     Expected Discharge Date: Expected Discharge Date: 11/07/13  Team Members Present: Physician leading conference: Dr. Claudette Laws Social Worker Present: Dossie Der, LCSW;Jenny Prevatt, LCSW Nurse Present: Chana Bode, RN PT Present: [name], PT OT Present: Perrin Maltese, Felipa Eth, OT SLP Present: Fae Pippin, SLP PPS Coordinator present :  Tora Duck, RN, CRRN        Current Status/Progress  Goal  Weekly Team Focus   Medical     Anxiety attacks, syncopal episodes, borderline CHF  Maintain proper fluid balance, help with anxiety  Cardiology to adjust medications   Bowel/Bladder     Continent of bowel and bladder. LBM 10/26/13  Pt to remain continent of bowel and bladder  cont of bowel and bladder   Swallow/Nutrition/ Hydration     WFL       ADL's     Pt is currently min assist to min guard assist for toileting and toilet transfers as well as UB dressing.  Needs mod to max assist for LB drressing.  Decreased overall endurance.  O2 sats 97% on 3-4Ls decrease to 90% with ADL on roomair.  Goals are overall modified independent level  selfcare retraining, AE education and use, balance, endurance   Mobility     Pt requires mod to max assist with bed mobility (limited due to sternal precautions), min/guard for standing, min/guard to supervision for gait training.  Perform all mobility on 4 L O2 with SaO2 remaining above 90's (however does desat to 90% at times, esp transitional movements).  He continues to have syncopal episodes and demonstrates decreased activity tolerance/poor  endurance and tends to get very anxious when his work of breathing increases.    Goals are supervision overall  balance, endurance, stair training, gait training, pt/family education   Communication     [facility]       Safety/Cognition/ Behavioral Observations    NO concerns       Pain     Tramadol 50mg  q 6hrs, prn  <3  Assess for nonverbal cues of pain   Skin     Sternal incision healing. Intermittent bruising to bilateral neck, arms, and abdomen. Steri strips x 3 to abdomen,   No additonal skin breakdown  Assess q shift for appropriate healing     *See Care Plan and progress notes for long and short-term goals.    Barriers to Discharge:  See above, still needs physical assist      Possible Resolutions to Barriers:    Continue rehabilitation,       Discharge Planning/Teaching Needs:    Home with three daughter's providing assistance, one is here daily with him      Team Discussion:    Shortness of breath limits him MD adjusting meds.  Limitations due to sternal precautions.  Endurance issues-but pushing through.   Revisions to Treatment Plan:    None    Continued Need for Acute Rehabilitation Level of Care: The patient requires daily medical management by a physician with specialized training in physical medicine and rehabilitation for the following conditions: Daily direction of a multidisciplinary physical rehabilitation program to ensure safe treatment while eliciting the highest outcome that is of practical value to the patient.: Yes Daily medical management of patient stability for increased activity during participation in an intensive rehabilitation regime.: Yes Daily analysis of laboratory values and/or radiology reports with any subsequent need for medication adjustment of medical intervention for : Post surgical problems;Cardiac problems  Lucy Chris 10/30/2013, 1:58 PM          Patient ID: [name], male   DOB: 09-Nov-1933, 79 y.o.   MRN: [id number]

## 2013-10-30 NOTE — Patient Care Conference (Signed)
Inpatient RehabilitationTeam Conference and Plan of Care Update Date: 10/30/2013   Time: 10:30 AM    Patient Name: Antonio Bernard      Medical Record Number: [id number]  Date of Birth: [birth year] Sex: Male         Room/Bed: 4W26C/4W26C-01 Payor Info: Payor: MEDICARE / Plan: MEDICARE PART A AND B / Product Type: *No Product type* /    Admitting Diagnosis: gabg chf, life vest   Admit Date/Time:  10/25/2013  4:18 PM Admission Comments: No comment available   Primary Diagnosis:  Physical deconditioning Principal Problem: Physical deconditioning  Patient Active Problem List   Diagnosis Date Noted  . Syncope 10/28/2013  . Hyponatremia 10/28/2013  . Physical deconditioning 10/25/2013  . Anemia associated with acute blood loss   . Acute systolic heart failure 10/16/2013  . Acute kidney injury 10/16/2013  . S/P CABG x 2: LIMA-LAD, SVG-DIAG 10/10/2013    Class: Chronic  . STEMI of anterior wall,  10/05/13 10/06/2013    Class: Hospitalized for  . Essential hypertension 10/06/2013    Class: Chronic  . Obesity (BMI 35.0-39.9 without comorbidity) 10/06/2013  . S/P urgent LAD PTCA 10/06/13 with IABP for LAD perfusion 10/06/2013  . Anxiety, claustrophobia 10/06/2013  . DJD  lumbar- surg August 2013 10/06/2013  . Sleep apnea- C-pap intol 10/06/2013  . AAA (abdominal aortic aneurysm)-hx of a "small" AAA found July 2013 (nothing in EPIC) 10/06/2013    Expected Discharge Date: Expected Discharge Date: 11/07/13  Team Members Present: Physician leading conference: Dr. Claudette Laws Social Worker Present: Dossie Der, LCSW;Jenny Prevatt, LCSW Nurse Present: Chana Bode, RN PT Present: [name], PT OT Present: Perrin Maltese, Felipa Eth, OT SLP Present: Fae Pippin, SLP PPS Coordinator present : Tora Duck, RN, CRRN     Current Status/Progress Goal Weekly Team Focus  Medical   Anxiety attacks, syncopal episodes, borderline CHF  Maintain proper fluid balance, help with  anxiety  Cardiology to adjust medications   Bowel/Bladder   Continent of bowel and bladder. LBM 10/26/13  Pt to remain continent of bowel and bladder  cont of bowel and bladder   Swallow/Nutrition/ Hydration     WFL        ADL's   Pt is currently min assist to min guard assist for toileting and toilet transfers as well as UB dressing.  Needs mod to max assist for LB drressing.  Decreased overall endurance.  O2 sats 97% on 3-4Ls decrease to 90% with ADL on roomair.  Goals are overall modified independent level  selfcare retraining, AE education and use, balance, endurance   Mobility   Pt requires mod to max assist with bed mobility (limited due to sternal precautions), min/guard for standing, min/guard to supervision for gait training.  Perform all mobility on 4 L O2 with SaO2 remaining above 90's (however does desat to 90% at times, esp transitional movements).  He continues to have syncopal episodes and demonstrates decreased activity tolerance/poor endurance and tends to get very anxious when his work of breathing increases.    Goals are supervision overall  balance, endurance, stair training, gait training, pt/family education   Communication     [facility]        Safety/Cognition/ Behavioral Observations    NO concerns        Pain   Tramadol 50mg  q 6hrs, prn  <3  Assess for nonverbal cues of pain   Skin   Sternal incision healing. Intermittent bruising to bilateral neck, arms, and abdomen. Steri strips  x 3 to abdomen,   No additonal skin breakdown  Assess q shift for appropriate healing      *See Care Plan and progress notes for long and short-term goals.  Barriers to Discharge: See above, still needs physical assist    Possible Resolutions to Barriers:  Continue rehabilitation,    Discharge Planning/Teaching Needs:  Home with three daughter's providing assistance, one is here daily with him      Team Discussion:  Shortness of breath limits him MD adjusting meds.  Limitations due  to sternal precautions.  Endurance issues-but pushing through.  Revisions to Treatment Plan:  None   Continued Need for Acute Rehabilitation Level of Care: The patient requires daily medical management by a physician with specialized training in physical medicine and rehabilitation for the following conditions: Daily direction of a multidisciplinary physical rehabilitation program to ensure safe treatment while eliciting the highest outcome that is of practical value to the patient.: Yes Daily medical management of patient stability for increased activity during participation in an intensive rehabilitation regime.: Yes Daily analysis of laboratory values and/or radiology reports with any subsequent need for medication adjustment of medical intervention for : Post surgical problems;Cardiac problems  [name], Lemar Livings 10/30/2013, 1:58 PM

## 2013-10-30 NOTE — Progress Notes (Signed)
Advanced Heart Failure Rounding Note   Subjective:     Antonio Bernard is a 79 yo male with a hx of HTN, severe anxiety, and OSA who presented to the ED 10/05/13 with a STEMI and was taken to the cath lab. Cath revealed proximally occluded LAD - after flow was restored it was apparently there was extensive LAD disease as well as severe 90-95% ostial D1 disease . There was moderate to severe focal disease of the sub-branches as well. Balloon angioplasty was performed, but no stent was placed. TIMI 2 flow was noted in the LAD and an IABP was placed. He was then evaluated by TCTS and was taken for a CABG x2 LIMA-LAD; SVG-DIAG on 10/08/13. After CABG he was brought back to the ICU still on IABP and multiple pressors. The balloon pump was weaned off 10/10/13 and he was extubated 10/13/13. Patient developed transient afib on 11/11 and was started on Amiodarone gtt.   11/11/4 Venous doppler acute deep DVT involving R peroneal vein 10/15/13 EF 30-35%. RV ok.  10/24/13 ECHO EF 25% RV ok. Lifevest placed.  Transferred to CIR 10/24/13  Received 80 mg IV lasix yesterday. Weight down 3 lbs. Still complaining of fatigue and SOB. Cr stable 1.57  Orthostatics 123/79     77 HR Lying  110/72     78 HR sitting 111/70     81 HR standing  Objective:   Weight Range:  Vital Signs:   Temp:  [97 F (36.1 C)-97.6 F (36.4 C)] 97 F (36.1 C) (11/26 0621) Pulse Rate:  [75-82] 75 (11/26 0653) Resp:  [20] 20 (11/26 0653) BP: (110-133)/(70-79) 133/75 mmHg (11/26 0653) SpO2:  [93 %-98 %] 98 % (11/26 1122) Weight:  [248 lb 1.6 oz (112.537 kg)] 248 lb 1.6 oz (112.537 kg) (11/26 0621) Last BM Date: 10/26/13  Weight change: Filed Weights   10/28/13 0501 10/29/13 0443 10/30/13 0621  Weight: 243 lb 12.8 oz (110.587 kg) 251 lb 6.4 oz (114.034 kg) 248 lb 1.6 oz (112.537 kg)    Intake/Output:   Intake/Output Summary (Last 24 hours) at 10/30/13 1357 Last data filed at 10/30/13 0452  Gross per 24 hour  Intake    360 ml   Output   1000 ml  Net   -640 ml     Physical Exam: General:   NAD; sitting in wheelchair  HEENT: normal Neck: supple. JVP 8-9. Carotids 2+ bilat; no bruits. No lymphadenopathy or thryomegaly appreciated. Cor: PMI nondisplaced. Regular rate & rhythm. No rubs, gallops or murmurs.Sternal incision approximated.  Lungs: clear Abdomen: soft, nontender, non-distended. No hepatosplenomegaly. No bruits or masses. Good bowel sounds. Extremities: no cyanosis, clubbing, rash, BLE 1+ edema;; RUE triple lumen picc Neuro: alert & orientedx3, cranial nerves grossly intact. moves all 4 extremities w/o difficulty. Affect pleasant   Labs: Basic Metabolic Panel:  Recent Labs Lab 10/27/13 0500 10/27/13 1515 10/28/13 0530 10/29/13 0500 10/30/13 0530  NA 131* 127* 129* 128* 130*  K 3.6 4.3 3.7 3.6 4.3  CL 87* 85* 86* 85* 88*  CO2 35* 32 33* 33* 30  GLUCOSE 81 114* 93 94 95  BUN 47* 53* 54* 57* 64*  CREATININE 1.43* 1.65* 1.63* 1.54* 1.57*  CALCIUM 8.5 8.4 8.4 8.3* 8.6    Liver Function Tests: No results found for this basename: AST, ALT, ALKPHOS, BILITOT, PROT, ALBUMIN,  in the last 168 hours No results found for this basename: LIPASE, AMYLASE,  in the last 168 hours No results found for this basename: AMMONIA,  in the last 168 hours  CBC:  Recent Labs Lab 10/26/13 0415 10/27/13 0500 10/28/13 0530 10/29/13 0500 10/30/13 0530  WBC 10.6* 9.7 9.3 8.2 7.5  HGB 8.9* 8.7* 8.5* 8.8* 9.4*  HCT 26.7* 26.9* 26.6* 27.2* 29.3*  MCV 91.4 91.8 91.7 91.9 92.7  PLT 356 404* 401* 434* 463*    Cardiac Enzymes:  Recent Labs Lab 10/27/13 1514  TROPONINI <0.30    BNP: BNP (last 3 results)  Recent Labs  10/07/13 0400 10/17/13 0415  PROBNP 3092.0* 30734.0*    Imaging: Dg Chest 2 View  10/30/2013   CLINICAL DATA:  Pleural effusion.  Shortness of breath.  EXAM: CHEST  2 VIEW  COMPARISON:  Chest x-ray 10/27/2013.  FINDINGS: There is a right upper extremity PICC with tip terminating in  the mid to distal superior vena cava. External pacemaker/ defibrillator overlies the thorax. Lung volumes are low. There is cephalization of the pulmonary vasculature, indistinctness of the interstitial markings, and patchy airspace disease throughout the lungs bilaterally suggestive of moderate pulmonary edema. Moderate bilateral pleural effusions. Mild cardiomegaly. Upper mediastinal contours are within limits. Atherosclerosis in the thoracic status post median sternotomy  IMPRESSION: 1. Findings again compatible with congestive heart failure, as discussed above. Moderate bilateral pleural effusions appear slightly larger, but there has otherwise been little change compared with examination from 10/27/2013. 2. Atherosclerosis. 3. Support apparatus and postoperative changes, as above.   Electronically Signed   By: Trudie Reed M.D.   On: 10/30/2013 08:24     Medications:     Scheduled Medications: . ALPRAZolam  0.5-0.75 mg Oral QHS  . aspirin EC  81 mg Oral Daily  . atorvastatin  80 mg Oral q1800  . bisacodyl  10 mg Oral Daily   Or  . bisacodyl  10 mg Rectal Daily  . budesonide (PULMICORT) nebulizer solution  0.25 mg Nebulization BID  . carvedilol  3.125 mg Oral BID WC  . docusate sodium  200 mg Oral Daily  . feeding supplement (PRO-STAT SUGAR FREE 64)  30 mL Oral TID WC  . furosemide  40 mg Oral Daily  . insulin aspart  0-15 Units Subcutaneous TID WC  . lactose free nutrition  237 mL Oral TID WC  . levalbuterol  1.25 mg Nebulization QID  . pantoprazole  80 mg Oral Q1200  . potassium chloride  20 mEq Oral Daily  . saccharomyces boulardii  250 mg Oral BID  . spironolactone  25 mg Oral Daily  . Warfarin - Pharmacist Dosing Inpatient   Does not apply q1800    Infusions:    PRN Medications: ALPRAZolam, alum & mag hydroxide-simeth, guaiFENesin-dextromethorphan, levalbuterol, ondansetron (ZOFRAN) IV, ondansetron, oxyCODONE, sodium chloride, traMADol   Assessment:   1) CAD   S/p  CABG x 2 10/08/13 2) STEMI 3) A/C systolic HF    EF 30-35% 4) HTN 5) OSA 6) Severe Anxiety 7) Respiratory failure,   -- vent-mask 50% 8) Afib 9) AKI 10) DVT   -- R peroneal vein   -- on lovenox and coumadin 11) Hypokalemia/hyponatremia  Plan/Discussion:    Weight down 3 lbs, however his volume status remains elevated. Will give a dose of 40 mg IV lasix today with an additional 20 meq of KCL. His extremities are quite cool, since he has a PICC will order a co-ox to make sure he is not having low output.    INR remains elevated, pharmacy dosing.   Continue to work with CIR team and appreciate their great care.  Ulla Potash B,NP-C 1:57 PM   Patient seen and examined with Ulla Potash, NP. We discussed all aspects of the encounter. I agree with the assessment and plan as stated above.   Weight improved since yesterday but remains volume overloaded. Agree with IV lasix today. Change to po tomorrow.   Suspect syncope is neurocardiogenic in nature. Orthostatic vitals look ok.   Agree with checking co-ox.   We will continue to follow.  Antonio Koning,MD 5:01 PM

## 2013-10-30 NOTE — Progress Notes (Signed)
Social Work Patient ID: Antonio Bernard, male   DOB: [birth year], 79 y.o.   MRN: [id number] Met with pt and daughter to inform of team conferecne goals-supervision/mod/i level and discharge 12/4.  Both concerned about his shortness of breath. PA and MD addressing this issue.  Daughter's to provide supervision level and are hopeful he will improve with this issue.  Work toward discharge next week.

## 2013-10-30 NOTE — Progress Notes (Signed)
Occupational Therapy Session Note  Patient Details  Name: Antonio Bernard MRN: [id number] Date of Birth: Aug 07, 1933  Today's Date: 10/30/2013 Time: 4098-1191 Time Calculation (min): 42 min  Skilled Therapeutic Interventions/Progress Updates:    Pt worked on standing endurance during session.  Had him stand in the dayroom and work on putting balls without UE support for balance.  Pt overall min guard assist for standing 3 intervals of 2 to 2 1/2 minutes.  During rest breaks immediately after sitting pt's HR 57-70 BPM and O2 sats 93% on 3Ls nasal cannula.  Pt reporting increased dizziness at times after standing and bending slightly forward.  Pt's daughter present and supportive during session.    Therapy Documentation Precautions:  Precautions Precautions: Sternal;Fall Precaution Comments: Syncope with PT. Restrictions Weight Bearing Restrictions: No  Pain: Pain Assessment Pain Assessment: No/denies pain ADL: See FIM for current functional status  Therapy/Group: Individual Therapy  Eisen Robenson OTR/L 10/30/2013, 4:23 PM

## 2013-10-30 NOTE — Progress Notes (Signed)
Physical Therapy Note  Patient Details  Name: WINFREY CHILLEMI MRN: [id number] Date of Birth: 1932-12-29 Today's Date: 10/30/2013  Time: 730-815 45 minutes  1:1 No c/o pain.  Treatment focused on increasing activity tolerance.  Pt found supine in bed, upon sitting pt reports dizziness.  BP seated 111/69; standing 104/59. Dizziness resolves after 2 minutes in position.  Gait with RW in controlled environment with supervision 75' x 2, 150' x 1 with cues for deep breathing to reduce anxiety.  Sit to stands from low furniture with min A due to sternal precautions.  Pt more anxious this session, RN aware.   Maricela Schreur 10/30/2013, 8:13 AM

## 2013-10-31 ENCOUNTER — Encounter (HOSPITAL_COMMUNITY): Payer: Self-pay | Admitting: *Deleted

## 2013-10-31 ENCOUNTER — Inpatient Hospital Stay (HOSPITAL_COMMUNITY)
Admission: AD | Admit: 2013-10-31 | Discharge: 2013-11-08 | DRG: 291 | Disposition: A | Discharge status: Home Health | Payer: Medicare Other | Source: Intra-hospital | Attending: Internal Medicine | Admitting: Internal Medicine

## 2013-10-31 DIAGNOSIS — E876 Hypokalemia: Secondary | ICD-10-CM | POA: Diagnosis not present

## 2013-10-31 DIAGNOSIS — F419 Anxiety disorder, unspecified: Secondary | ICD-10-CM

## 2013-10-31 DIAGNOSIS — I2510 Atherosclerotic heart disease of native coronary artery without angina pectoris: Secondary | ICD-10-CM | POA: Diagnosis present

## 2013-10-31 DIAGNOSIS — R5381 Other malaise: Secondary | ICD-10-CM | POA: Diagnosis present

## 2013-10-31 DIAGNOSIS — I509 Heart failure, unspecified: Secondary | ICD-10-CM

## 2013-10-31 DIAGNOSIS — N189 Chronic kidney disease, unspecified: Secondary | ICD-10-CM | POA: Diagnosis present

## 2013-10-31 DIAGNOSIS — I5021 Acute systolic (congestive) heart failure: Secondary | ICD-10-CM | POA: Diagnosis present

## 2013-10-31 DIAGNOSIS — I129 Hypertensive chronic kidney disease with stage 1 through stage 4 chronic kidney disease, or unspecified chronic kidney disease: Secondary | ICD-10-CM | POA: Diagnosis present

## 2013-10-31 DIAGNOSIS — R570 Cardiogenic shock: Secondary | ICD-10-CM

## 2013-10-31 DIAGNOSIS — I10 Essential (primary) hypertension: Secondary | ICD-10-CM

## 2013-10-31 DIAGNOSIS — Z79899 Other long term (current) drug therapy: Secondary | ICD-10-CM

## 2013-10-31 DIAGNOSIS — I82409 Acute embolism and thrombosis of unspecified deep veins of unspecified lower extremity: Secondary | ICD-10-CM

## 2013-10-31 DIAGNOSIS — E669 Obesity, unspecified: Secondary | ICD-10-CM | POA: Diagnosis present

## 2013-10-31 DIAGNOSIS — Z7901 Long term (current) use of anticoagulants: Secondary | ICD-10-CM

## 2013-10-31 DIAGNOSIS — E871 Hypo-osmolality and hyponatremia: Secondary | ICD-10-CM | POA: Diagnosis present

## 2013-10-31 DIAGNOSIS — G4733 Obstructive sleep apnea (adult) (pediatric): Secondary | ICD-10-CM | POA: Diagnosis present

## 2013-10-31 DIAGNOSIS — Z951 Presence of aortocoronary bypass graft: Secondary | ICD-10-CM

## 2013-10-31 DIAGNOSIS — I2109 ST elevation (STEMI) myocardial infarction involving other coronary artery of anterior wall: Secondary | ICD-10-CM | POA: Diagnosis present

## 2013-10-31 DIAGNOSIS — I252 Old myocardial infarction: Secondary | ICD-10-CM

## 2013-10-31 DIAGNOSIS — I5043 Acute on chronic combined systolic (congestive) and diastolic (congestive) heart failure: Secondary | ICD-10-CM

## 2013-10-31 DIAGNOSIS — Z86718 Personal history of other venous thrombosis and embolism: Secondary | ICD-10-CM

## 2013-10-31 DIAGNOSIS — N179 Acute kidney failure, unspecified: Secondary | ICD-10-CM

## 2013-10-31 DIAGNOSIS — Z9861 Coronary angioplasty status: Secondary | ICD-10-CM

## 2013-10-31 DIAGNOSIS — F411 Generalized anxiety disorder: Secondary | ICD-10-CM | POA: Diagnosis present

## 2013-10-31 DIAGNOSIS — J9600 Acute respiratory failure, unspecified whether with hypoxia or hypercapnia: Secondary | ICD-10-CM | POA: Diagnosis present

## 2013-10-31 DIAGNOSIS — Z7982 Long term (current) use of aspirin: Secondary | ICD-10-CM

## 2013-10-31 DIAGNOSIS — I5023 Acute on chronic systolic (congestive) heart failure: Principal | ICD-10-CM | POA: Diagnosis present

## 2013-10-31 LAB — GLUCOSE, CAPILLARY
Glucose-Capillary: 97 mg/dL (ref 70–99)
Glucose-Capillary: 123 mg/dL — ABNORMAL HIGH (ref 70–99)
Glucose-Capillary: 119 mg/dL — ABNORMAL HIGH (ref 70–99)

## 2013-10-31 LAB — CARBOXYHEMOGLOBIN
Carboxyhemoglobin: 1.9 % — ABNORMAL HIGH (ref 0.5–1.5)
Methemoglobin: 1.5 % (ref 0.0–1.5)
O2 Saturation: 57.5 %

## 2013-10-31 LAB — PROTIME-INR: Prothrombin Time: 28.5 seconds — ABNORMAL HIGH (ref 11.6–15.2)

## 2013-10-31 MED ORDER — ONDANSETRON HCL 4 MG/2ML IJ SOLN: 4.0000 mg | Freq: Four times a day (QID) | INTRAMUSCULAR | Status: DC | PRN

## 2013-10-31 MED ORDER — SODIUM CHLORIDE 0.9 % IJ SOLN: 3.0000 mL | INTRAMUSCULAR | Status: DC | PRN

## 2013-10-31 MED ORDER — ALPRAZOLAM 0.25 MG PO TABS: 0.2500 mg | ORAL_TABLET | ORAL | Status: DC

## 2013-10-31 MED ORDER — VITAMIN C 500 MG PO TABS: 1000.0000 mg | ORAL_TABLET | Freq: Every day | ORAL | Status: DC

## 2013-10-31 MED ORDER — RIVAROXABAN 20 MG PO TABS: 20.0000 mg | ORAL_TABLET | Freq: Every day | ORAL | Status: DC

## 2013-10-31 MED ORDER — SODIUM CHLORIDE 0.9 % IV SOLN: 250.0000 mL | INTRAVENOUS | Status: DC | PRN

## 2013-10-31 MED ORDER — SODIUM CHLORIDE 0.9 % IJ SOLN
3.0000 mL | Freq: Two times a day (BID) | INTRAMUSCULAR | Status: DC
  Administered 2013-10-31 – 2013-11-03 (×5): 3 mL via INTRAVENOUS
  Administered 2013-11-04: 10 mL via INTRAVENOUS
  Administered 2013-11-05 – 2013-11-06 (×3): 3 mL via INTRAVENOUS

## 2013-10-31 MED ORDER — VITAMIN C 500 MG PO TABS
1000.0000 mg | ORAL_TABLET | Freq: Every day | ORAL | Status: DC
  Administered 2013-11-01 – 2013-11-08 (×8): 1000 mg via ORAL
  Filled 2013-10-31 (×8): qty 2

## 2013-10-31 MED ORDER — INSULIN ASPART 100 UNIT/ML ~~LOC~~ SOLN: 0.0000 [IU] | Freq: Three times a day (TID) | SUBCUTANEOUS | Status: DC

## 2013-10-31 MED ORDER — ACETAMINOPHEN 325 MG PO TABS
650.0000 mg | ORAL_TABLET | ORAL | Status: DC | PRN
  Administered 2013-11-02: 650 mg via ORAL
  Filled 2013-10-31: qty 2

## 2013-10-31 MED ORDER — ALPRAZOLAM 0.25 MG PO TABS
0.2500 mg | ORAL_TABLET | Freq: Three times a day (TID) | ORAL | Status: DC
  Administered 2013-10-31 – 2013-11-08 (×23): 0.25 mg via ORAL
  Filled 2013-10-31 (×23): qty 1

## 2013-10-31 MED ORDER — OXYCODONE HCL 5 MG PO TABS
5.0000 mg | ORAL_TABLET | ORAL | Status: DC | PRN
  Administered 2013-11-01 – 2013-11-04 (×5): 5 mg via ORAL
  Administered 2013-11-04: 10 mg via ORAL
  Administered 2013-11-05 – 2013-11-08 (×7): 5 mg via ORAL
  Filled 2013-10-31: qty 2
  Filled 2013-10-31 (×2): qty 1
  Filled 2013-10-31: qty 2
  Filled 2013-10-31 (×8): qty 1

## 2013-10-31 MED ORDER — TRAMADOL HCL 50 MG PO TABS: 50.0000 mg | ORAL_TABLET | Freq: Four times a day (QID) | ORAL | Status: DC | PRN

## 2013-10-31 MED ORDER — RIVAROXABAN 15 MG PO TABS
15.0000 mg | ORAL_TABLET | Freq: Two times a day (BID) | ORAL | Status: DC
  Filled 2013-10-31 (×2): qty 1

## 2013-10-31 MED ORDER — ATORVASTATIN CALCIUM 80 MG PO TABS
80.0000 mg | ORAL_TABLET | Freq: Every day | ORAL | Status: DC
  Administered 2013-10-31 – 2013-11-07 (×8): 80 mg via ORAL
  Filled 2013-10-31 (×9): qty 1

## 2013-10-31 MED ORDER — ADULT MULTIVITAMIN W/MINERALS CH
1.0000 | ORAL_TABLET | Freq: Every day | ORAL | Status: DC
  Administered 2013-11-01 – 2013-11-08 (×8): 1 via ORAL
  Filled 2013-10-31 (×8): qty 1

## 2013-10-31 MED ORDER — RIVAROXABAN 15 MG PO TABS
15.0000 mg | ORAL_TABLET | Freq: Two times a day (BID) | ORAL | Status: DC
  Administered 2013-11-01 – 2013-11-08 (×15): 15 mg via ORAL
  Filled 2013-10-31 (×17): qty 1

## 2013-10-31 MED ORDER — ALPRAZOLAM 0.5 MG PO TABS
1.0000 mg | ORAL_TABLET | Freq: Every day | ORAL | Status: DC
  Administered 2013-10-31 – 2013-11-07 (×8): 1 mg via ORAL
  Filled 2013-10-31 (×8): qty 2

## 2013-10-31 MED ORDER — METOLAZONE 5 MG PO TABS
5.0000 mg | ORAL_TABLET | Freq: Two times a day (BID) | ORAL | Status: DC
  Administered 2013-11-01 (×3): 5 mg via ORAL
  Filled 2013-10-31 (×5): qty 1

## 2013-10-31 MED ORDER — DOCUSATE SODIUM 100 MG PO CAPS
200.0000 mg | ORAL_CAPSULE | Freq: Two times a day (BID) | ORAL | Status: DC | PRN
  Filled 2013-10-31: qty 2

## 2013-10-31 MED ORDER — FUROSEMIDE 10 MG/ML IJ SOLN
80.0000 mg | Freq: Two times a day (BID) | INTRAMUSCULAR | Status: DC
  Administered 2013-10-31: 80 mg via INTRAVENOUS
  Filled 2013-10-31: qty 8

## 2013-10-31 MED ORDER — ALPRAZOLAM 0.25 MG PO TABS: 0.2500 mg | ORAL_TABLET | Freq: Two times a day (BID) | ORAL | Status: DC | PRN

## 2013-10-31 MED ORDER — NITROGLYCERIN 0.4 MG SL SUBL: 0.4000 mg | SUBLINGUAL_TABLET | SUBLINGUAL | Status: DC | PRN

## 2013-10-31 MED ORDER — INSULIN ASPART 100 UNIT/ML ~~LOC~~ SOLN: 0.0000 [IU] | Freq: Every day | SUBCUTANEOUS | Status: DC

## 2013-10-31 MED ORDER — MILRINONE IN DEXTROSE 20 MG/100ML IV SOLN
0.1250 ug/kg/min | INTRAVENOUS | Status: DC
  Administered 2013-10-31: 0.125 ug/kg/min via INTRAVENOUS
  Administered 2013-11-01 – 2013-11-03 (×5): 0.25 ug/kg/min via INTRAVENOUS
  Administered 2013-11-04: 0.125 ug/kg/min via INTRAVENOUS
  Filled 2013-10-31 (×8): qty 100

## 2013-10-31 MED ORDER — CARVEDILOL 3.125 MG PO TABS
3.1250 mg | ORAL_TABLET | Freq: Two times a day (BID) | ORAL | Status: DC
  Administered 2013-10-31: 3.125 mg via ORAL
  Filled 2013-10-31 (×2): qty 1

## 2013-10-31 MED ORDER — GLUCERNA SHAKE PO LIQD
237.0000 mL | Freq: Three times a day (TID) | ORAL | Status: DC
  Administered 2013-10-31 – 2013-11-08 (×10): 237 mL via ORAL

## 2013-10-31 MED ORDER — FUROSEMIDE 10 MG/ML IJ SOLN
10.0000 mg/h | INTRAVENOUS | Status: DC
  Administered 2013-11-01 – 2013-11-02 (×3): 20 mg/h via INTRAVENOUS
  Filled 2013-10-31 (×8): qty 25

## 2013-10-31 MED ORDER — ASPIRIN EC 81 MG PO TBEC
81.0000 mg | DELAYED_RELEASE_TABLET | Freq: Every day | ORAL | Status: DC
  Administered 2013-11-01 – 2013-11-08 (×8): 81 mg via ORAL
  Filled 2013-10-31 (×8): qty 1

## 2013-10-31 MED ORDER — VITAMIN D3 25 MCG (1000 UNIT) PO TABS
2000.0000 [IU] | ORAL_TABLET | Freq: Every day | ORAL | Status: DC
  Administered 2013-11-01 – 2013-11-08 (×8): 2000 [IU] via ORAL
  Filled 2013-10-31 (×8): qty 2

## 2013-10-31 NOTE — Progress Notes (Signed)
Pt refusing CPAP tonight. No distress 

## 2013-10-31 NOTE — Progress Notes (Signed)
  Patient with increasing dyspnea yesterday with cool extremities.   Co-ox obtained and was 46%.   Orders written last night for transfer to [facility] SDU for initiation of milrinone and IV diuretics but apparently not transferred as requested.  I spoke with Dr. Wynn Banker today. Patient remains stable at rest but extremities cool and marked fatigue with activity. Will transfer back to 2H for optimization of his HF regimen using CVPs and co-ox.  I have contacted 2H charge nurse who will help [place] facilitate transfer.  Daniel Bensimhon,MD 12:22 PM

## 2013-10-31 NOTE — Progress Notes (Signed)
ANTICOAGULATION CONSULT NOTE   Pharmacy Consult:  Xarelto Indication:  DVT  Allergies  Allergen Reactions  . Demerol [Meperidine] Other (See Comments)    "makes me crazy"  . Lisinopril Diarrhea  . Shellfish Allergy Rash    Patient Measurements: Height: 6\' 1"  (185.4 cm) Weight: 244 lb 4.3 oz (110.8 kg) IBW/kg (Calculated) : 79.9  Vital Signs: Temp: 97.8 F (36.6 C) (11/27 1600) Temp src: Oral (11/27 1600) BP: 119/65 mmHg (11/27 1800) Pulse Rate: 68 (11/27 1800)  Labs:  Recent Labs  10/29/13 0500 10/30/13 0530 10/31/13 0610  HGB 8.8* 9.4*  --   HCT 27.2* 29.3*  --   PLT 434* 463*  --   LABPROT 34.3* 37.4* 28.5*  INR 3.56* 3.99* 2.80*  CREATININE 1.54* 1.57*  --     Estimated Creatinine Clearance: 51.4 ml/min (by C-G formula based on Cr of 1.57).  Assessment: 79 YOM on Coumadin for DVT.  INR down to 2.8 (therapeutic). Studies recommend starting Xarelto when INR <3. No bleeding reported.  Goal of Therapy:  Treatment and prevention of DVT Monitor platelets by anticoagulation protocol: Yes  Plan:  1) Xarelto 15mg  po BID x 21 days then 20mg  daily (20mg  daily will start on 12/19) 2) D/c daily INR   Thank you for allowing pharmacy to be a part of this patients care team.  Lovenia Kim Pharm.D., BCPS Clinical Pharmacist 10/31/2013 7:27 PM Pager: [phone] Phone: [phone]

## 2013-10-31 NOTE — Discharge Summary (Signed)
patient examined and medical record reviewed,agree with above note. VAN TRIGT III,PETER 10/31/2013   

## 2013-10-31 NOTE — Progress Notes (Signed)
Patient ID: Antonio Bernard, male   DOB: 04-14-1933, 79 y.o.   MRN: [id number]  Lasix switched over the po today.  Will see again tomorrow to assess volume.   [name] 10/31/2013

## 2013-10-31 NOTE — H&P (Signed)
History and Physical   Patient ID: Antonio Bernard MRN: [id number], DOB/AGE: 1933-03-27 79 y.o. Date of Encounter: 10/31/2013  Primary Physician: Thayer Headings, MD Primary Cardiologist: Hilty  Chief Complaint:  SOB, low-output CHF  HPI: Antonio Bernard is a 79 y.o. male with a recent Dx of CAD.   He was admitted 11/2 with an anterior STEMI, had PTCA of the LAD and IABP placed. EF was 40-45% by echo. CABG was indicated for other disease, seen by [facility], had LIMA-LAD, SVG-D1 on 11/5. His postoperative course was prolonged and he was initially followed by [facility] heart and vascular as well as the surgeons. However, he had recurrent problems with heart failure and began being followed by the heart failure team. He had significant deconditioning as well as a number other medical problems (including right peroneal DVT) listed in the 11/20 discharge summary.   Inpatient rehabilitation was recommended and he was discharged to inpatient rehabilitation on 10/25/2013.  Since then, he has made some progress but it has been slow. He has had ongoing problems with volume overload. He was seen by Dr. Gala Romney who recommended admission to the inpatient unit for milrinone and IV Lasix diuresis.  He was transferred to CCU step down 10/31/2013. Currently he is extremely weak and short of breath. His extremities are cool but he had some diaphoresis in his upper body. He denies chest pain. He does not feel like his shortness of breath has improved much in the last few days. He agrees that he is very weak but feels that his pain from the surgery is well controlled.  His weight on admission was 106.6 kg. His postop weight was 129.5 kg. Since then, there has been in general downward trend in his weight this a.m. was 110.8 kg.  Past Medical History  Diagnosis Date  . Acid reflux   . Family history of anesthesia complication     daughter had problem waking up  . Pneumonia     hx of  .  Sleep apnea     no treatment for, last sleep study over 5 years ago  . Abdominal aortic aneurysm     found 06/29/12 "small"  . Headache(784.0)   . Herniated vertebral disc     thoracic area  . Cataracts, bilateral     hx of  . H/O Legionnaire's disease     about 24 years ago  . Anxiety     panic attacks   . Claustrophobia     "severe"  . Hypertension     sees  Dr. Ronne Binning [phone]  . Obesity (BMI 35.0-39.9 without comorbidity) 10/06/2013  . Essential hypertension 10/06/2013  . Anginal pain   . Atrial fibrillation     Postoperative  . Acute renal insufficiency     Postoperative  . Anemia associated with acute blood loss     Postoperative    Surgical History:  Past Surgical History  Procedure Laterality Date  . Shoulder surgery      left  . Cataract extraction w/ intraocular lens implant      bilateral  . Lumbar laminectomy/decompression microdiscectomy  07/20/2012    Procedure: LUMBAR LAMINECTOMY/DECOMPRESSION MICRODISCECTOMY 1 LEVEL;  Surgeon: Reinaldo Meeker, MD;  Location: [facility];  Service: Neurosurgery;  Laterality: N/A;  lumbar four-five left  . Back surgery    . Cardiac catheterization  10/05/2013  . Eye surgery    . Coronary artery bypass graft N/A 10/08/2013    Procedure: CORONARY ARTERY BYPASS  GRAFTING (CABG);  Surgeon: Kerin Perna, MD;  Location: [facility] OR;  Service: Open Heart Surgery;  Laterality: N/A;  Times 2 using left internal mammary artery and endoscopically harvested left saphenous vein  . Intraoperative transesophageal echocardiogram N/A 10/08/2013    Procedure: INTRAOPERATIVE TRANSESOPHAGEAL ECHOCARDIOGRAM;  Surgeon: Kerin Perna, MD;  Location: [facility] OR;  Service: Open Heart Surgery;  Laterality: N/A;     I have reviewed the patient's current medications. Prior to Admission medications   Medication Sig Start Date End Date Taking? Authorizing Provider  ALPRAZolam (XANAX) 0.25 MG tablet Take 0.25-1 mg by mouth See admin instructions.      Historical Provider, MD  Ascorbic Acid (VITAMIN C) 1000 MG tablet Take 1,000 mg by mouth daily.    Historical Provider, MD  aspirin EC 81 MG tablet Take 81 mg by mouth daily.    Historical Provider, MD  atorvastatin (LIPITOR) 80 MG tablet Take 1 tablet (80 mg total) by mouth daily at 6 PM. 10/25/13   Erin Barrett, PA-C  carvedilol (COREG) 3.125 MG tablet Take 1 tablet (3.125 mg total) by mouth 2 (two) times daily with a meal. 10/25/13   Erin Barrett, PA-C  cholecalciferol (VITAMIN D) 1000 UNITS tablet Take 2,000 Units by mouth daily.    Historical Provider, MD  docusate sodium 100 MG CAPS Take 200 mg by mouth 2 (two) times daily as needed for mild constipation. 10/25/13   Erin Barrett, PA-C  feeding supplement, GLUCERNA SHAKE, (GLUCERNA SHAKE) LIQD Take 237 mLs by mouth 3 (three) times daily between meals. 10/25/13   Erin Barrett, PA-C  furosemide (LASIX) 80 MG tablet Take 1 tablet (80 mg total) by mouth daily. 10/25/13   Erin Barrett, PA-C  Multiple Vitamin (MULTIVITAMIN WITH MINERALS) TABS tablet Take 1 tablet by mouth daily.    Historical Provider, MD  oxyCODONE (OXY IR/ROXICODONE) 5 MG immediate release tablet Take 1-2 tablets (5-10 mg total) by mouth every 3 (three) hours as needed for moderate pain. 10/25/13   Erin Barrett, PA-C  spironolactone (ALDACTONE) 25 MG tablet Take 1 tablet (25 mg total) by mouth daily. 10/25/13   Erin Barrett, PA-C  traMADol (ULTRAM) 50 MG tablet Take 1 tablet (50 mg total) by mouth every 6 (six) hours as needed for moderate pain. 10/25/13   Erin Barrett, PA-C  warfarin (COUMADIN) 7.5 MG tablet Take 1 tablet (7.5 mg total) by mouth daily at 6 PM. 10/25/13   Erin Barrett, PA-C   Scheduled Meds: . ALPRAZolam  0.25 mg Oral TID AC  . ALPRAZolam  1 mg Oral QHS  . [START ON 11/01/2013] aspirin EC  81 mg Oral Daily  . atorvastatin  80 mg Oral q1800  . carvedilol  3.125 mg Oral BID WC  . [START ON 11/01/2013] cholecalciferol  2,000 Units Oral Daily  . feeding supplement  (GLUCERNA SHAKE)  237 mL Oral TID BM  . insulin aspart  0-5 Units Subcutaneous QHS  . insulin aspart  0-9 Units Subcutaneous TID WC  . [START ON 11/01/2013] multivitamin with minerals  1 tablet Oral Daily  . [START ON 11/01/2013] vitamin C  1,000 mg Oral Daily   Continuous Infusions:  PRN Meds:.docusate sodium, oxyCODONE, traMADol  Allergies:  Allergies  Allergen Reactions  . Demerol [Meperidine] Other (See Comments)    "makes me crazy"  . Lisinopril Diarrhea  . Shellfish Allergy Rash    History   Social History  . Marital Status: Widowed    Spouse Name: N/A    Number of  Children: N/A  . Years of Education: N/A   Occupational History  . Retired    Social History Main Topics  . Smoking status: Former Smoker -- 0.50 packs/day    Types: Cigarettes, Cigars  . Smokeless tobacco: Never Used     Comment: 50 years ago  . Alcohol Use: No  . Drug Use: No  . Sexual Activity: No   Other Topics Concern  . Not on file   Social History Narrative   Was living alone prior to admission.   Family Status  Relation Status Death Age  . Mother Deceased   . Father Deceased     Review of Systems:   Full 14-point review of systems otherwise negative except as noted above.  Physical Exam: Blood pressure 127/63, pulse 74, temperature 97.8 F (36.6 C), temperature source Oral, resp. rate 19, height 6\' 1"  (1.854 m), weight 244 lb 4.3 oz (110.8 kg), SpO2 100.00%. General: Well developed, well nourished,male in no acute distress.  Chronically ill Head: Normocephalic, atraumatic, sclera non-icteric, no xanthomas, nares are without discharge. Dentition: Good Neck: No carotid bruits. JVD elevated. No thyromegally Lungs: Good expansion bilaterally. without wheezes or rhonchi. + Bibasilar Rales Heart: Regular rate and rhythm with S1 S2.  No S3 or S4.  No murmur, no rubs, or gallops appreciated. Abdomen: Soft, non-tender, slightly distended with normoactive bowel sounds. No hepatomegaly. No  rebound/guarding. No obvious abdominal masses. Msk:  Strength and tone appear weak for age. No joint deformities or effusions, no spine or costo-vertebral angle tenderness. Extremities: No clubbing or cyanosis. 2+ edema.  Distal pedal pulses are 2+ in 4 extrem Neuro: Alert when spoken to but otherwise a little sleepy. He is oriented X 3. Moves all extremities spontaneously. No focal deficits noted. Psych:  Responds to questions appropriately with a normal affect. Skin: No rashes or lesions noted  Labs:   Lab Results  Component Value Date   WBC 7.5 10/30/2013   HGB 9.4* 10/30/2013   HCT 29.3* 10/30/2013   MCV 92.7 10/30/2013   PLT 463* 10/30/2013    Recent Labs  10/31/13 0610  INR 2.80*     Recent Labs Lab 10/30/13 0530  NA 130*  K 4.3  CL 88*  CO2 30  BUN 64*  CREATININE 1.57*  CALCIUM 8.6  GLUCOSE 95   Pro B Natriuretic peptide (BNP)  Date/Time Value Range Status  10/17/2013  4:15 AM 30734.0* 0 - 450 pg/mL Final  10/07/2013  4:00 AM 3092.0* 0 - 450 pg/mL Final    Radiology/Studies: Dg Chest 2 View 10/30/2013   CLINICAL DATA:  Pleural effusion.  Shortness of breath.  EXAM: CHEST  2 VIEW  COMPARISON:  Chest x-ray 10/27/2013.  FINDINGS: There is a right upper extremity PICC with tip terminating in the mid to distal superior vena cava. External pacemaker/ defibrillator overlies the thorax. Lung volumes are low. There is cephalization of the pulmonary vasculature, indistinctness of the interstitial markings, and patchy airspace disease throughout the lungs bilaterally suggestive of moderate pulmonary edema. Moderate bilateral pleural effusions. Mild cardiomegaly. Upper mediastinal contours are within limits. Atherosclerosis in the thoracic status post median sternotomy  IMPRESSION: 1. Findings again compatible with congestive heart failure, as discussed above. Moderate bilateral pleural effusions appear slightly larger, but there has otherwise been little change compared with  examination from 10/27/2013. 2. Atherosclerosis. 3. Support apparatus and postoperative changes, as above.   Electronically Signed   By: Trudie Reed M.D.   On: 10/30/2013 08:24  ECG: 10/27/2013 Sinus rhythm Vent. rate 86 BPM PR interval 186 ms QRS duration 104 ms QT/QTc 400/478 ms P-R-T axes 60 138 -18  ASSESSMENT AND PLAN:  1. Acute systolic CHF - he appears to have low output heart failure. We will add milrinone and IV Lasix at 80 mg twice a day. He will have CVP and co-ox checks. Hold the beta blocker for now, restart once his status is more stable. Continue other medications including home medications for anxiety. Continue Coumadin per pharmacy.  Melida Quitter, PA-C 10/31/2013 6:46 PM Beeper [phone]  I have seen, examined the patient, and reviewed the above assessment and plan.  Changes to above are made where necessary.  The patient is admitted for medical management of advanced congestive heart failure with mixed systolic and diastolic dysfunction.  He is quite SOB on exam and has evidence of significant volume overload.  As per Dr Gala Romney, we will initiate IV milrinone and continue to diurese with IV lasix with CVP guidance.    Co Sign: Hillis Range, MD 10/31/2013 7:09 PM

## 2013-10-31 NOTE — Progress Notes (Signed)
ANTICOAGULATION CONSULT NOTE   Pharmacy Consult:  Xarelto Indication:  DVT  Allergies  Allergen Reactions  . Demerol [Meperidine] Other (See Comments)    "makes me crazy"  . Lisinopril Diarrhea  . Shellfish Allergy Rash    Patient Measurements: Height: 6\' 1"  (185.4 cm) Weight: 252 lb 3.2 oz (114.397 kg) IBW/kg (Calculated) : 79.9  Vital Signs: Temp: 97.4 F (36.3 C) (11/27 0509) Temp src: Oral (11/27 0509) BP: 129/71 mmHg (11/27 0515) Pulse Rate: 79 (11/27 0509)  Labs:  Recent Labs  10/29/13 0500 10/30/13 0530 10/31/13 0610  HGB 8.8* 9.4*  --   HCT 27.2* 29.3*  --   PLT 434* 463*  --   LABPROT 34.3* 37.4* 28.5*  INR 3.56* 3.99* 2.80*  CREATININE 1.54* 1.57*  --     Estimated Creatinine Clearance: 52.2 ml/min (by C-G formula based on Cr of 1.57).  Assessment: 79 YOM on Coumadin for DVT.  INR down to 2.8 (therapeutic). Studies recommend starting Xarelto when INR <3. No bleeding reported.  Goal of Therapy:  Treatment and prevention of DVT Monitor platelets by anticoagulation protocol: Yes  Plan:  1) Xarelto 15mg  po BID x 21 days then 20mg  daily (20mg  daily will start on 12/19) 2) D/c daily INR  Christoper Fabian, PharmD, BCPS Clinical pharmacist, pager [phone] 10/31/2013 10:12 AM

## 2013-10-31 NOTE — Progress Notes (Signed)
Subjective/Complaints: 79 y.o. male with history of HTN, obesity, OSA, anxiety disorder; who was admitted on 10/05/13 with STEMI. he underwent urgent cardiac catheterization by Dr. Herbie [place] that demonstrated a occlusion of the LAD which was opened with PTCA and IA:PB placed to help flow through LAD. He was allowed to recover from MI and underwent CABG X 1 on 10/08/13 and extubated on 10/13/13. Has required pressors and transient A fib treated with amiodarone. He had decrease in UOP with fluid overload due to decompensated CHF, hypoxia and AKI. Dr. Lorie Phenix consulted for input and recommended increasing diuresis as well as weaning off of dopamine. LE dopplers done due to hypoxia and right peroneal vein DVT noted and patient started on heparin for treatment.  Renal failure slowly improving and urine output improved with improvement in respiratory status. He has had syncope due to orthostasis and started on midodrine for BP support. Has been refusing CPAP. He continues to have SOB and diuretics being adjusted to help with fluid overload as well as orthostatic symptoms. LifeVest recommended due to ICM--repeat echo with severe hypokinesis and septal and apex akinesis with EF 25%. done. Patient refusing Life Vest due to "claustophobia" but willing to try for a period of time. Patient deconditioned and CIR recommended by MD and therapy team.   No further Syncopal episodes.  History of orthostatic events. Appreciate cardiac note Breathing improved after diuresis however still complains of fatigue. Heart failure team indicated to pt that he may be transferred to acute care. No PT/OT scheduled today  Review of Systems - Negative except Fatigue, shortness of breath with exertion, anxiety  Objective: Vital Signs: Blood pressure 129/71, pulse 79, temperature 97.4 F (36.3 C), temperature source Oral, resp. rate 20, height 6\' 1"  (1.854 m), weight 114.397 kg (252 lb 3.2 oz), SpO2 95.00%. Dg Chest 2  View  10/30/2013   CLINICAL DATA:  Pleural effusion.  Shortness of breath.  EXAM: CHEST  2 VIEW  COMPARISON:  Chest x-ray 10/27/2013.  FINDINGS: There is a right upper extremity PICC with tip terminating in the mid to distal superior vena cava. External pacemaker/ defibrillator overlies the thorax. Lung volumes are low. There is cephalization of the pulmonary vasculature, indistinctness of the interstitial markings, and patchy airspace disease throughout the lungs bilaterally suggestive of moderate pulmonary edema. Moderate bilateral pleural effusions. Mild cardiomegaly. Upper mediastinal contours are within limits. Atherosclerosis in the thoracic status post median sternotomy  IMPRESSION: 1. Findings again compatible with congestive heart failure, as discussed above. Moderate bilateral pleural effusions appear slightly larger, but there has otherwise been little change compared with examination from 10/27/2013. 2. Atherosclerosis. 3. Support apparatus and postoperative changes, as above.   Electronically Signed   By: Trudie Reed M.D.   On: 10/30/2013 08:24   Results for orders placed during the hospital encounter of 10/25/13 (from the past 72 hour(s))  GLUCOSE, CAPILLARY     Status: Abnormal   Collection Time    10/28/13  1:01 PM      Result Value Range   Glucose-Capillary 125 (*) 70 - 99 mg/dL  GLUCOSE, CAPILLARY     Status: Abnormal   Collection Time    10/28/13  4:29 PM      Result Value Range   Glucose-Capillary 105 (*) 70 - 99 mg/dL   Comment 1 Notify RN    GLUCOSE, CAPILLARY     Status: Abnormal   Collection Time    10/28/13  8:44 PM      Result Value Range  Glucose-Capillary 120 (*) 70 - 99 mg/dL  BASIC METABOLIC PANEL     Status: Abnormal   Collection Time    10/29/13  5:00 AM      Result Value Range   Sodium 128 (*) 135 - 145 mEq/L   Potassium 3.6  3.5 - 5.1 mEq/L   Chloride 85 (*) 96 - 112 mEq/L   CO2 33 (*) 19 - 32 mEq/L   Glucose, Bld 94  70 - 99 mg/dL   BUN 57 (*) 6  - 23 mg/dL   Creatinine, Ser 1.61 (*) 0.50 - 1.35 mg/dL   Calcium 8.3 (*) 8.4 - 10.5 mg/dL   GFR calc non Af Amer 42 (*) >90 mL/min   GFR calc Af Amer 48 (*) >90 mL/min   Comment: (NOTE)     The eGFR has been calculated using the CKD EPI equation.     This calculation has not been validated in all clinical situations.     eGFR's persistently <90 mL/min signify possible Chronic Kidney     Disease.  CBC     Status: Abnormal   Collection Time    10/29/13  5:00 AM      Result Value Range   WBC 8.2  4.0 - 10.5 K/uL   RBC 2.96 (*) 4.22 - 5.81 MIL/uL   Hemoglobin 8.8 (*) 13.0 - 17.0 g/dL   HCT 09.6 (*) 04.5 - 40.9 %   MCV 91.9  78.0 - 100.0 fL   MCH 29.7  26.0 - 34.0 pg   MCHC 32.4  30.0 - 36.0 g/dL   RDW 81.1  91.4 - 78.2 %   Platelets 434 (*) 150 - 400 K/uL  PROTIME-INR     Status: Abnormal   Collection Time    10/29/13  5:00 AM      Result Value Range   Prothrombin Time 34.3 (*) 11.6 - 15.2 seconds   INR 3.56 (*) 0.00 - 1.49  GLUCOSE, CAPILLARY     Status: Abnormal   Collection Time    10/29/13  7:18 AM      Result Value Range   Glucose-Capillary 113 (*) 70 - 99 mg/dL   Comment 1 Notify RN    GLUCOSE, CAPILLARY     Status: Abnormal   Collection Time    10/29/13 11:37 AM      Result Value Range   Glucose-Capillary 106 (*) 70 - 99 mg/dL   Comment 1 Notify RN    GLUCOSE, CAPILLARY     Status: Abnormal   Collection Time    10/29/13  4:36 PM      Result Value Range   Glucose-Capillary 104 (*) 70 - 99 mg/dL   Comment 1 Notify RN    GLUCOSE, CAPILLARY     Status: Abnormal   Collection Time    10/29/13  8:58 PM      Result Value Range   Glucose-Capillary 108 (*) 70 - 99 mg/dL   Comment 1 Notify RN    BASIC METABOLIC PANEL     Status: Abnormal   Collection Time    10/30/13  5:30 AM      Result Value Range   Sodium 130 (*) 135 - 145 mEq/L   Potassium 4.3  3.5 - 5.1 mEq/L   Chloride 88 (*) 96 - 112 mEq/L   CO2 30  19 - 32 mEq/L   Glucose, Bld 95  70 - 99 mg/dL   BUN 64  (*) 6 - 23 mg/dL  Creatinine, Ser 1.57 (*) 0.50 - 1.35 mg/dL   Calcium 8.6  8.4 - 16.1 mg/dL   GFR calc non Af Amer 41 (*) >90 mL/min   GFR calc Af Amer 47 (*) >90 mL/min   Comment: (NOTE)     The eGFR has been calculated using the CKD EPI equation.     This calculation has not been validated in all clinical situations.     eGFR's persistently <90 mL/min signify possible Chronic Kidney     Disease.  CBC     Status: Abnormal   Collection Time    10/30/13  5:30 AM      Result Value Range   WBC 7.5  4.0 - 10.5 K/uL   RBC 3.16 (*) 4.22 - 5.81 MIL/uL   Hemoglobin 9.4 (*) 13.0 - 17.0 g/dL   HCT 09.6 (*) 04.5 - 40.9 %   MCV 92.7  78.0 - 100.0 fL   MCH 29.7  26.0 - 34.0 pg   MCHC 32.1  30.0 - 36.0 g/dL   RDW 81.1  91.4 - 78.2 %   Platelets 463 (*) 150 - 400 K/uL  PROTIME-INR     Status: Abnormal   Collection Time    10/30/13  5:30 AM      Result Value Range   Prothrombin Time 37.4 (*) 11.6 - 15.2 seconds   INR 3.99 (*) 0.00 - 1.49  GLUCOSE, CAPILLARY     Status: None   Collection Time    10/30/13  8:12 AM      Result Value Range   Glucose-Capillary 90  70 - 99 mg/dL   Comment 1 Notify RN    GLUCOSE, CAPILLARY     Status: Abnormal   Collection Time    10/30/13 11:29 AM      Result Value Range   Glucose-Capillary 137 (*) 70 - 99 mg/dL   Comment 1 Notify RN    CARBOXYHEMOGLOBIN     Status: Abnormal   Collection Time    10/30/13  3:30 PM      Result Value Range   Total hemoglobin 9.1 (*) 13.5 - 18.0 g/dL   O2 Saturation 95.6     Carboxyhemoglobin 1.8 (*) 0.5 - 1.5 %   Methemoglobin 1.4  0.0 - 1.5 %  GLUCOSE, CAPILLARY     Status: Abnormal   Collection Time    10/30/13  5:02 PM      Result Value Range   Glucose-Capillary 146 (*) 70 - 99 mg/dL   Comment 1 Notify RN    GLUCOSE, CAPILLARY     Status: Abnormal   Collection Time    10/30/13  8:27 PM      Result Value Range   Glucose-Capillary 109 (*) 70 - 99 mg/dL   Comment 1 Notify RN    PROTIME-INR     Status: Abnormal    Collection Time    10/31/13  6:10 AM      Result Value Range   Prothrombin Time 28.5 (*) 11.6 - 15.2 seconds   INR 2.80 (*) 0.00 - 1.49  GLUCOSE, CAPILLARY     Status: None   Collection Time    10/31/13  7:30 AM      Result Value Range   Glucose-Capillary 97  70 - 99 mg/dL   Comment 1 Notify RN        Nursing note and vitals reviewed.  Constitutional: He appears well-developed and well-nourished. Alert and comfortable  HENT: oral mucosa pink and moist. Dentition  fair  Head: Normocephalic.  Eyes: Conjunctivae are normal. Pupils are equal, round, and reactive to light.  Cardiovascular: Normal rate. Normal rhythm, no murmurs  Respiratory: Effort normal. Chest clear without wheezes rales or rhonchi  GI: Soft.  Musculoskeletal:  Chest wall, legs slightly tender  Neurological:  Reasonable insight and awareness. Strength 4- deltoids and biceps, triceps, to 4/5 distally at wrist and hands. HF's 3, KE 3+, ankles 4/5. No gross sensory deficits.pt is alert and appropriate.  Skin:  Chest incision, legs clean and intact, still somewhat tender  Psychiatric: He has a normal mood and affect. His behavior is normal.   Assessment/Plan: 1. Functional deficits secondary to Deconditioning after CABG which require 3+ hours per day of interdisciplinary therapy in a comprehensive inpatient rehab setting. Physiatrist is providing close team supervision and 24 hour management of active medical problems listed below. Physiatrist and rehab team continue to assess barriers to discharge/monitor patient progress toward functional and medical goals. Patient with marginal therapy tolerance FIM: FIM - Bathing Bathing Steps Patient Completed: Chest;Right Arm;Left Arm;Abdomen;Front perineal area;Right upper leg;Left upper leg Bathing: 3: Mod-Patient completes 5-7 33f 10 parts or 50-74%  FIM - Upper Body Dressing/Undressing Upper body dressing/undressing steps patient completed: Thread/unthread right sleeve  of front closure shirt/dress;Thread/unthread left sleeve of front closure shirt/dress;Pull shirt around back of front closure shirt/dress;Button/unbutton shirt Upper body dressing/undressing: 0: Wears gown/pajamas-no public clothing FIM - Lower Body Dressing/Undressing Lower body dressing/undressing steps patient completed: Thread/unthread right pants leg;Thread/unthread left pants leg;Pull pants up/down Lower body dressing/undressing: 2: Max-Patient completed 25-49% of tasks  FIM - Toileting Toileting steps completed by patient: Adjust clothing prior to toileting Toileting: 2: Max-Patient completed 1 of 3 steps  FIM - Diplomatic Services operational officer Devices: Environmental consultant;Bedside commode;Grab bars Toilet Transfers: 4-To toilet/BSC: Min A (steadying Pt. > 75%);4-From toilet/BSC: Min A (steadying Pt. > 75%)  FIM - Bed/Chair Transfer Bed/Chair Transfer Assistive Devices: Therapist, occupational: 4: Supine > Sit: Min A (steadying Pt. > 75%/lift 1 leg);4: Bed > Chair or W/C: Min A (steadying Pt. > 75%)  FIM - Locomotion: Wheelchair Locomotion: Wheelchair: 1: Total Assistance/staff pushes wheelchair (Pt<25%) FIM - Locomotion: Ambulation Locomotion: Ambulation Assistive Devices: Designer, industrial/product Ambulation/Gait Assistance: 4: Min guard Locomotion: Ambulation: 2: Travels 50 - 149 ft with minimal assistance (Pt.>75%)  Comprehension Comprehension Mode: Auditory Comprehension: 5-Understands basic 90% of the time/requires cueing < 10% of the time  Expression Expression Mode: Verbal Expression: 6-Expresses complex ideas: With extra time/assistive device  Social Interaction Social Interaction: 5-Interacts appropriately 90% of the time - Needs monitoring or encouragement for participation or interaction.  Problem Solving Problem Solving: 4-Solves basic 75 - 89% of the time/requires cueing 10 - 24% of the time  Memory Memory: 5-Recognizes or recalls 90% of the time/requires cueing <  10% of the time  Medical Problem List and Plan:  Deconditioning due to multiple medical issues past CABG  1. R peroneal DVT: Pharmaceutical: Coumadin  2. Pain Management: Will continue oxycodone prn--not using any at this time. Will monitor for now.  3. Anxiety disorder with panic attacks/Mood: Continue Xanax. Pt would like xanax at a schedule which is closer to his home regimen. ---will give prn during the day and 0.5 to.75mg  at bedtime.  -Team to offer ego support and encouragement. LCSW to follow for evaluation.  4. Neuropsych: This patient is capable of making decisions on his own behalf.  5. Hypotension: Off midodrine. Monitor for tolerance with increase in activity.  6. A fib: resolved  and stable off amiodarone.  7. OSA: Non compliant/intolerant of CPAP--continue to encourage use. Hypoxia resolved but back on oxygen now.  8. Acute on chronic systolic CHF: Modified diet. Check weights daily. Continue lasix, amiodarone, lasix and corvedilol--Heart failure team to assist with management. carboxyHgb ordered, possible transfer to acute care cardiology when tele bed avail, no urgent medical issues 9. ICM: Fitted with life vest  10. Acute kidney injury: Resolving. Continue to monitor lytes and urine output.  11. ABLA: Baseline Hgb 13.9 but low iron stores noted.--will supplement. Has had slow but steady downward trend in H/H. Monitor for signs of bleeding with coumadin on board. Check stool guaiacs. Check H/H twice a week. Stable today 12. Abnormal LFTs: likely medication related.  13. Stress induced hyperglycemia: Baseline Hgb A1c --5.5.   LOS (Days) 6 A FACE TO FACE EVALUATION WAS PERFORMED  Marthena Whitmyer E 10/31/2013, 10:42 AM

## 2013-11-01 ENCOUNTER — Encounter (HOSPITAL_COMMUNITY): Payer: Medicare (Managed Care) | Admitting: Occupational Therapy

## 2013-11-01 ENCOUNTER — Inpatient Hospital Stay (HOSPITAL_COMMUNITY): Payer: Medicare (Managed Care) | Admitting: Occupational Therapy

## 2013-11-01 ENCOUNTER — Inpatient Hospital Stay (HOSPITAL_COMMUNITY): Payer: Medicare Other | Admitting: Physical Therapy

## 2013-11-01 ENCOUNTER — Inpatient Hospital Stay (HOSPITAL_COMMUNITY): Payer: Medicare Other | Admitting: *Deleted

## 2013-11-01 DIAGNOSIS — F411 Generalized anxiety disorder: Secondary | ICD-10-CM

## 2013-11-01 DIAGNOSIS — I5043 Acute on chronic combined systolic (congestive) and diastolic (congestive) heart failure: Secondary | ICD-10-CM

## 2013-11-01 DIAGNOSIS — I82409 Acute embolism and thrombosis of unspecified deep veins of unspecified lower extremity: Secondary | ICD-10-CM

## 2013-11-01 DIAGNOSIS — R570 Cardiogenic shock: Secondary | ICD-10-CM

## 2013-11-01 LAB — CARBOXYHEMOGLOBIN
Carboxyhemoglobin: 2.2 % — ABNORMAL HIGH (ref 0.5–1.5)
Methemoglobin: 1.4 % (ref 0.0–1.5)
Methemoglobin: 0.8 % (ref 0.0–1.5)
O2 Saturation: 56.3 %
O2 Saturation: 52 %
Total hemoglobin: 10.4 g/dL — ABNORMAL LOW (ref 13.5–18.0)
Total hemoglobin: 12.4 g/dL — ABNORMAL LOW (ref 13.5–18.0)

## 2013-11-01 LAB — BASIC METABOLIC PANEL
BUN: 60 mg/dL — ABNORMAL HIGH (ref 6–23)
CO2: 32 mEq/L (ref 19–32)
Chloride: 88 mEq/L — ABNORMAL LOW (ref 96–112)
GFR calc non Af Amer: 51 mL/min — ABNORMAL LOW (ref >90)
Glucose, Bld: 235 mg/dL — ABNORMAL HIGH (ref 70–99)
Potassium: 3.7 mEq/L (ref 3.5–5.1)
Sodium: 130 mEq/L — ABNORMAL LOW (ref 135–145)

## 2013-11-01 LAB — GLUCOSE, CAPILLARY: Glucose-Capillary: 108 mg/dL — ABNORMAL HIGH (ref 70–99)

## 2013-11-01 MED ORDER — POTASSIUM CHLORIDE CRYS ER 20 MEQ PO TBCR
20.0000 meq | EXTENDED_RELEASE_TABLET | Freq: Two times a day (BID) | ORAL | Status: DC
  Administered 2013-11-02: 20 meq via ORAL
  Filled 2013-11-01 (×2): qty 1

## 2013-11-01 NOTE — Progress Notes (Signed)
  Continues with dyspnea at rest.   CVP 21. Co-ox improved. Minimal response to 80 IV lasix. (150 cc)  Recently had trouble diuresing and required lasix gtt, milrinone and dopamine.   Will increase milrinone to 0.25. Start lasix gtt at 20 and metolazone.   He is intolerant of BIPAP due to severe anxiety.   Daniel Bensimhon,MD 12:11 AM

## 2013-11-01 NOTE — Care Management Note (Addendum)
  Page 2 of 2   11/08/2013     1:58:44 PM   CARE MANAGEMENT NOTE 11/08/2013  Patient:  Antonio Bernard, Antonio Bernard   Account Number:  [id number]  Date Initiated:  11/01/2013  Documentation initiated by:  Junius Creamer  Subjective/Objective Assessment:   adm w heart failure     Action/Plan:   lives alone, recently in cir for rehab   Anticipated DC Date:     Anticipated DC Plan:  HOME W HOME HEALTH SERVICES      DC Planning Services  CM consult  Medication Assistance      [facility] Choice  HOME HEALTH   Choice offered to / List presented to:  C-4 Adult Children        HH arranged  HH-1 RN  HH-2 PT  HH-3 OT  HH-4 NURSE'S AIDE      HH agency  [facility]   Status of service:   Medicare Important Message given?   (If response is "NO", the following Medicare IM given date fields will be blank) Date Medicare IM given:   Date Additional Medicare IM given:    Discharge Disposition:  HOME W HOME HEALTH SERVICES  Per UR Regulation:  Reviewed for med. necessity/level of care/duration of stay  If discussed at Long Length of Stay Meetings, dates discussed:   11/05/2013  11/07/2013    Comments:  11/08/13 [address], RN, Dudley Major [phone] Spoke with Jonah Blue of [facility] concerning pt recent admission.  Faxed D/C summary and medication list to [phone] as requested.  11/07/13 13[address] RN MSN BSN CCM Daughters planning to provide 24/7 assistance for pt, request home health aide to assist with bathing.  OT recommends home therapy, notified [facility] liaison.  12/1 1015a debbie dowell rn,bsn spoke w pt. he was living alone pta. he has 3da's and 2 son's. left pt [facility] hhc list. will talk w pt again when da in room. he was at cir but hopes to go home instead of cir. spoke w da cindy. no pref to hhc agency. ref to [facility] for hhrn and hhpt. also gave pt 30day free xarelto card. copay for xarelto 15.00 per month.

## 2013-11-01 NOTE — Progress Notes (Signed)
Advanced Heart Failure Rounding Note   Subjective:     Antonio Bernard is a 79 yo male with a hx of HTN, severe anxiety, and OSA who presented to the ED 10/05/13 with a STEMI and was taken to the cath lab. Cath revealed proximally occluded LAD - after flow was restored it was apparently there was extensive LAD disease as well as severe 90-95% ostial D1 disease. Balloon angioplasty was performed, but no stent was placed. TIMI 2 flow was noted in the LAD and an IABP was placed. He was then evaluated by TCTS and was taken for a CABG x2 LIMA-LAD; SVG-DIAG on 10/08/13. After CABG he was brought back to the ICU still on IABP and multiple pressors. The balloon pump was weaned off 10/10/13 and he was extubated 10/13/13. Patient had problems with marked volume overload and cardiorenal syndrome required lasix gtt, milrinone and dopamine to diurese. Transferred to CIR on 10/25/13.  Transferred back to [facility] from CIR on 11/27 due to recurrent low output HF (co-ox 46%) with volume overload and respiratory distress.   11/11/4 Venous doppler acute deep DVT involving R peroneal vein 10/24/13 ECHO EF 25% RV ok. Lifevest placed.  Last night had poor response to IV lasix. Milrinone increased, started on lasix drip and metolazone. Overnight has had 750cc/out. Breathing better. Extremities much warmer.   CVP 20. Co-ox 56%.   Objective:   Weight Range:  Vital Signs:   Temp:  [97.6 F (36.4 C)-97.8 F (36.6 C)] 97.6 F (36.4 C) (11/28 0400) Pulse Rate:  [68-78] 78 (11/28 0400) Resp:  [11-20] 17 (11/28 0400) BP: (108-131)/(50-65) 127/61 mmHg (11/28 0400) SpO2:  [91 %-100 %] 97 % (11/28 0400) Weight:  [110.8 kg (244 lb 4.3 oz)-111.5 kg (245 lb 13 oz)] 111.5 kg (245 lb 13 oz) (11/28 0444) Last BM Date: 10/31/13  Weight change: Filed Weights   10/31/13 1400 11/01/13 0444  Weight: 110.8 kg (244 lb 4.3 oz) 111.5 kg (245 lb 13 oz)    Intake/Output:   Intake/Output Summary (Last 24 hours) at 11/01/13 0529 Last data  filed at 11/01/13 0400  Gross per 24 hour  Intake 202.34 ml  Output   1300 ml  Net -1097.66 ml     Physical Exam: General:  Lying in bed NAD.  HEENT: normal Neck: supple. JVP to ear. Carotids 2+ bilat; no bruits. No lymphadenopathy or thryomegaly appreciated. Cor: PMI laterally displaced. Regular rate & rhythm. +s3 Sternal incision approximated.  Lungs: rhonchorous Abdomen: soft, nontender, + mildly distended. No hepatosplenomegaly. No bruits or masses. Good bowel sounds. Extremities: no cyanosis, clubbing, rash, BLE 2+ edema;; RUE triple lumen picc Neuro: alert & orientedx3, cranial nerves grossly intact. moves all 4 extremities w/o difficulty. Affect pleasant   Labs: Basic Metabolic Panel:  Recent Labs Lab 10/27/13 1515 10/28/13 0530 10/29/13 0500 10/30/13 0530 11/01/13 0413  NA 127* 129* 128* 130* 130*  K 4.3 3.7 3.6 4.3 3.7  CL 85* 86* 85* 88* 88*  CO2 32 33* 33* 30 32  GLUCOSE 114* 93 94 95 235*  BUN 53* 54* 57* 64* 60*  CREATININE 1.65* 1.63* 1.54* 1.57* 1.30  CALCIUM 8.4 8.4 8.3* 8.6 8.2*    Liver Function Tests: No results found for this basename: AST, ALT, ALKPHOS, BILITOT, PROT, ALBUMIN,  in the last 168 hours No results found for this basename: LIPASE, AMYLASE,  in the last 168 hours No results found for this basename: AMMONIA,  in the last 168 hours  CBC:  Recent Labs Lab  10/26/13 0415 10/27/13 0500 10/28/13 0530 10/29/13 0500 10/30/13 0530  WBC 10.6* 9.7 9.3 8.2 7.5  HGB 8.9* 8.7* 8.5* 8.8* 9.4*  HCT 26.7* 26.9* 26.6* 27.2* 29.3*  MCV 91.4 91.8 91.7 91.9 92.7  PLT 356 404* 401* 434* 463*    Cardiac Enzymes:  Recent Labs Lab 10/27/13 1514  TROPONINI <0.30    BNP: BNP (last 3 results)  Recent Labs  10/07/13 0400 10/17/13 0415  PROBNP 3092.0* 30734.0*    Imaging: Dg Chest 2 View  10/30/2013   CLINICAL DATA:  Pleural effusion.  Shortness of breath.  EXAM: CHEST  2 VIEW  COMPARISON:  Chest x-ray 10/27/2013.  FINDINGS: There is a  right upper extremity PICC with tip terminating in the mid to distal superior vena cava. External pacemaker/ defibrillator overlies the thorax. Lung volumes are low. There is cephalization of the pulmonary vasculature, indistinctness of the interstitial markings, and patchy airspace disease throughout the lungs bilaterally suggestive of moderate pulmonary edema. Moderate bilateral pleural effusions. Mild cardiomegaly. Upper mediastinal contours are within limits. Atherosclerosis in the thoracic status post median sternotomy  IMPRESSION: 1. Findings again compatible with congestive heart failure, as discussed above. Moderate bilateral pleural effusions appear slightly larger, but there has otherwise been little change compared with examination from 10/27/2013. 2. Atherosclerosis. 3. Support apparatus and postoperative changes, as above.   Electronically Signed   By: Trudie Reed M.D.   On: 10/30/2013 08:24     Medications:     Scheduled Medications: . ALPRAZolam  0.25 mg Oral TID AC  . ALPRAZolam  1 mg Oral QHS  . aspirin EC  81 mg Oral Daily  . atorvastatin  80 mg Oral q1800  . cholecalciferol  2,000 Units Oral Daily  . feeding supplement (GLUCERNA SHAKE)  237 mL Oral TID BM  . insulin aspart  0-5 Units Subcutaneous QHS  . insulin aspart  0-9 Units Subcutaneous TID WC  . metolazone  5 mg Oral BID  . multivitamin with minerals  1 tablet Oral Daily  . [START ON 11/02/2013] potassium chloride  20 mEq Oral BID  . Rivaroxaban  15 mg Oral BID WC  . sodium chloride  3 mL Intravenous Q12H  . vitamin C  1,000 mg Oral Daily    Infusions: . furosemide (LASIX) infusion 20 mg/hr (11/01/13 0027)  . milrinone 0.25 mcg/kg/min (11/01/13 0500)    PRN Medications: sodium chloride, acetaminophen, ALPRAZolam, docusate sodium, nitroGLYCERIN, ondansetron (ZOFRAN) IV, oxyCODONE, sodium chloride, traMADol   Assessment:   1) Cardiogenic shock 2) A/c systolic HF EF 25% 3) Acute respiratory failure 4)  CAD   S/p CABG x 2 10/08/13 5) Anterior STEMI 6) Severe Anxiety 7) RLE DVT   -- switched from coumadin to Xarelto 8) Hypokalemia/hyponatremia  Plan/Discussion:    He is starting to improve on lasix gtt, milrinone and metolazone. Will continue this regimen for at least 24-48 hours as he has significant volume overload. Will continue to hold b-blocker due to low output. Start low-dose enalapril and digoxin. Given rapid decompensation after transfer to CIR will have to wean inotropes slowly when time comes.   Continue Xarelto for DVT.  Daniel Bensimhon,MD 5:29 AM

## 2013-11-01 NOTE — Discharge Summary (Signed)
Physician Discharge Summary  Patient ID: Antonio Bernard MRN: [id number] DOB/AGE: 77-21-1934 77 y.o.  Admit date: 10/25/2013 Discharge date: 11/01/2013  Discharge Diagnoses:  Principal Problem:   Physical deconditioning Active Problems:   Obesity (BMI 35.0-39.9 without comorbidity)   Anxiety, claustrophobia   Sleep apnea- C-pap intol   Acute systolic heart failure   Acute kidney injury   Anemia associated with acute blood loss   Syncope   Hyponatremia   Discharged Condition: Stable  Significant Diagnostic Studies: Dg Chest 2 View  10/30/2013   CLINICAL DATA:  Pleural effusion.  Shortness of breath.  EXAM: CHEST  2 VIEW  COMPARISON:  Chest x-ray 10/27/2013.  FINDINGS: There is a right upper extremity PICC with tip terminating in the mid to distal superior vena cava. External pacemaker/ defibrillator overlies the thorax. Lung volumes are low. There is cephalization of the pulmonary vasculature, indistinctness of the interstitial markings, and patchy airspace disease throughout the lungs bilaterally suggestive of moderate pulmonary edema. Moderate bilateral pleural effusions. Mild cardiomegaly. Upper mediastinal contours are within limits. Atherosclerosis in the thoracic status post median sternotomy  IMPRESSION: 1. Findings again compatible with congestive heart failure, as discussed above. Moderate bilateral pleural effusions appear slightly larger, but there has otherwise been little change compared with examination from 10/27/2013. 2. Atherosclerosis. 3. Support apparatus and postoperative changes, as above.   Electronically Signed   By: Trudie Reed M.D.   On: 10/30/2013 08:24    Dg Chest Port 1 View  10/27/2013   CLINICAL DATA:  Short of breath  EXAM: PORTABLE CHEST - 1 VIEW  COMPARISON:  10/23/2013  FINDINGS: Progress of bilateral airspace disease consistent with pulmonary edema. Increase in bilateral effusion and bibasilar atelectasis.  Right arm PICC tip in the SVC  unchanged.  IMPRESSION: Progressive heart failure with edema and bilateral pleural effusions.   Electronically Signed   By: Marlan Palau M.D.   On: 10/27/2013 16:43     Labs:  Basic Metabolic Panel:  Recent Labs Lab 10/26/13 0415 10/27/13 0500 10/27/13 1515 10/28/13 0530 10/29/13 0500 10/30/13 0530 11/01/13 0413  NA 131* 131* 127* 129* 128* 130* 130*  K 3.8 3.6 4.3 3.7 3.6 4.3 3.7  CL 88* 87* 85* 86* 85* 88* 88*  CO2 34* 35* 32 33* 33* 30 32  GLUCOSE 100* 81 114* 93 94 95 235*  BUN 44* 47* 53* 54* 57* 64* 60*  CREATININE 1.68* 1.43* 1.65* 1.63* 1.54* 1.57* 1.30  CALCIUM 8.5 8.5 8.4 8.4 8.3* 8.6 8.2*    CBC:  Recent Labs Lab 10/28/13 0530 10/29/13 0500 10/30/13 0530  WBC 9.3 8.2 7.5  HGB 8.5* 8.8* 9.4*  HCT 26.6* 27.2* 29.3*  MCV 91.7 91.9 92.7  PLT 401* 434* 463*    CBG:  Recent Labs Lab 10/31/13 0730 10/31/13 1151 10/31/13 1653 10/31/13 2212 11/01/13 0748  GLUCAP 97 123* 95 119* 108*    Brief HPI:   [name] is a 80 y.o. male with history of HTN, obesity, OSA, anxiety disorder; who was admitted on 10/05/13 with STEMI. he underwent urgent cardiac catheterization by Dr. Herbie Baltimore that demonstrated a occlusion of the LAD which was opened with PTCA and IA:PB placed to help flow through LAD. He was allowed to recover from MI and underwent CABG X 1 on 10/08/13 and extubated on 10/13/13. Has required pressors and transient A fib treated with amiodarone. He had decrease in UOP with fluid overload due to decompensated CHF, hypoxia and AKI. Dr. Lorie Phenix consulted for input  and recommended increasing diuresis as well as weaning off of dopamine. LE dopplers done due to hypoxia and right peroneal vein DVT noted and patient started on heparin for treatment.  Renal failure slowly improving.  He continues to have SOB and diuretics being adjusted to help with fluid overload as well as orthostatic symptoms. LifeVest recommended due to ICM--repeat echo with severe  hypokinesis and septal and apex akinesis with EF 25%. done. Patient refusing Life Vest due to "claustophobia" but willing to try for a period of time. Patient deconditioned and CIR recommended by MD and therapy team   Hospital Course: [name] was admitted to rehab 10/25/2013 for inpatient therapies to consist of PT, ST and OT at least three hours five days a week. Past admission physiatrist, therapy team and rehab RN have worked together to provide customized collaborative inpatient rehab. Daily weights were checked with close monitoring for signs of overload. . Patient has had syncopal episode on 10/27/13 due to orthostasis. TEDs were ordered for support. Cardiology has been following for management of CHF and lasix was resumed  as weights were on upward trend. He continued to have DOE that was a limiting factor with rehab. Po intake has been variable and multiple supplements were added to help promote healing and endurance. He continued to refuse CPAP but has been compliant with LifeVest. No alarms noted during his stay.  Sternal incision was healing well without signs or symptoms of infection.  Psychiatry has follow up for evaluation and assistance with patient's long term anxiety and panic attacks. Patient felt that xanax was controlling his symptoms. Patient and family denied any adverse changes and no further follow up was indicated during this stay.  Patient's had 8 lbs weight gain with increase in peripheral edema with coolness of extremities as well as worsening of dyspnea despite diuresis with IV lasix. Co-ox level at 46% and Dr. Garth Schlatter recommended transfer to stepdown for initiation of milrinone and IV diuresis. On 11/01/13, patient was transferred to acute services.   Rehab course: During patient's stay in rehab weekly team conferences were held to monitor patient's progress, set goals and discuss barriers to discharge. He was limited by decreased overall endurance as well as  elevated anxiety levels when dyspneic. He required min assist to min guard assist for toileting and toilet transfers. She requires min assist for upper body dressing. He needs mod to max assist for LB dressing. He required mod to max assist for bed mobility due to sternal precautions. He was min to min guard assist for transfers and mobility.   Disposition: Discharge to Telemetry.   Diet: Cardiac diet. Low salt.      Medication List    ASK your doctor about these medications       ALPRAZolam 0.25 MG tablet  Commonly known as:  XANAX  Take 0.25-1 mg by mouth See admin instructions.     aspirin EC 81 MG tablet  Take 81 mg by mouth daily.     atorvastatin 80 MG tablet  Commonly known as:  LIPITOR  Take 1 tablet (80 mg total) by mouth daily at 6 PM.     carvedilol 3.125 MG tablet  Commonly known as:  COREG  Take 1 tablet (3.125 mg total) by mouth 2 (two) times daily with a meal.     cholecalciferol 1000 UNITS tablet  Commonly known as:  VITAMIN D  Take 2,000 Units by mouth daily.     DSS 100 MG Caps  Take 200  mg by mouth 2 (two) times daily as needed for mild constipation.     feeding supplement (GLUCERNA SHAKE) Liqd  Take 237 mLs by mouth 3 (three) times daily between meals.     furosemide 80 MG tablet  Commonly known as:  LASIX  Take 1 tablet (80 mg total) by mouth daily.     multivitamin with minerals Tabs tablet  Take 1 tablet by mouth daily.     oxyCODONE 5 MG immediate release tablet  Commonly known as:  Oxy IR/ROXICODONE  Take 1-2 tablets (5-10 mg total) by mouth every 3 (three) hours as needed for moderate pain.     spironolactone 25 MG tablet  Commonly known as:  ALDACTONE  Take 1 tablet (25 mg total) by mouth daily.     traMADol 50 MG tablet  Commonly known as:  ULTRAM  Take 1 tablet (50 mg total) by mouth every 6 (six) hours as needed for moderate pain.     vitamin C 1000 MG tablet  Take 1,000 mg by mouth daily.     warfarin 7.5 MG tablet  Commonly  known as:  COUMADIN  Take 1 tablet (7.5 mg total) by mouth daily at 6 PM.         Signed: Jacquelynn Cree 11/01/2013, 9:14 AM

## 2013-11-02 DIAGNOSIS — Z951 Presence of aortocoronary bypass graft: Secondary | ICD-10-CM

## 2013-11-02 DIAGNOSIS — I82409 Acute embolism and thrombosis of unspecified deep veins of unspecified lower extremity: Secondary | ICD-10-CM

## 2013-11-02 LAB — BASIC METABOLIC PANEL
BUN: 55 mg/dL — ABNORMAL HIGH (ref 6–23)
BUN: 59 mg/dL — ABNORMAL HIGH (ref 6–23)
CO2: 44 mEq/L (ref 19–32)
Calcium: 8 mg/dL — ABNORMAL LOW (ref 8.4–10.5)
Calcium: 8.8 mg/dL (ref 8.4–10.5)
Creatinine, Ser: 1.54 mg/dL — ABNORMAL HIGH (ref 0.50–1.35)
Creatinine, Ser: 1.72 mg/dL — ABNORMAL HIGH (ref 0.50–1.35)
GFR calc Af Amer: 48 mL/min — ABNORMAL LOW (ref >90)
GFR calc non Af Amer: 41 mL/min — ABNORMAL LOW (ref >90)
Glucose, Bld: 97 mg/dL (ref 70–99)
Potassium: 3 mEq/L — ABNORMAL LOW (ref 3.5–5.1)
Sodium: 135 mEq/L (ref 135–145)

## 2013-11-02 LAB — CARBOXYHEMOGLOBIN: Total hemoglobin: 9 g/dL — ABNORMAL LOW (ref 13.5–18.0)

## 2013-11-02 LAB — GLUCOSE, CAPILLARY
Glucose-Capillary: 92 mg/dL (ref 70–99)
Glucose-Capillary: 94 mg/dL (ref 70–99)
Glucose-Capillary: 98 mg/dL (ref 70–99)

## 2013-11-02 MED ORDER — POTASSIUM CHLORIDE CRYS ER 20 MEQ PO TBCR: 40.0000 meq | EXTENDED_RELEASE_TABLET | Freq: Two times a day (BID) | ORAL | Status: DC

## 2013-11-02 MED ORDER — POTASSIUM CHLORIDE 20 MEQ/15ML (10%) PO LIQD
40.0000 meq | ORAL | Status: DC
  Filled 2013-11-02 (×3): qty 30

## 2013-11-02 MED ORDER — POTASSIUM CHLORIDE CRYS ER 20 MEQ PO TBCR
40.0000 meq | EXTENDED_RELEASE_TABLET | ORAL | Status: AC
  Administered 2013-11-02 (×2): 40 meq via ORAL
  Filled 2013-11-02 (×2): qty 2

## 2013-11-02 MED ORDER — POTASSIUM CHLORIDE CRYS ER 20 MEQ PO TBCR
40.0000 meq | EXTENDED_RELEASE_TABLET | Freq: Two times a day (BID) | ORAL | Status: DC
  Administered 2013-11-02: 40 meq via ORAL
  Filled 2013-11-02 (×3): qty 2

## 2013-11-02 NOTE — Progress Notes (Signed)
Patient ID: [name], male   DOB: Aug 13, 1933, 79 y.o.   MRN: [id number] Advanced Heart Failure Rounding Note   Subjective:     Mr. Eschmann is a 79 yo male with a hx of HTN, severe anxiety, and OSA who presented to the ED 10/05/13 with a STEMI and was taken to the cath lab. Cath revealed proximally occluded LAD - after flow was restored it was apparently there was extensive LAD disease as well as severe 90-95% ostial D1 disease. Balloon angioplasty was performed, but no stent was placed. TIMI 2 flow was noted in the LAD and an IABP was placed. He was then evaluated by TCTS and was taken for a CABG x2 LIMA-LAD; SVG-DIAG on 10/08/13. After CABG he was brought back to the ICU still on IABP and multiple pressors. The balloon pump was weaned off 10/10/13 and he was extubated 10/13/13. Patient had problems with marked volume overload and cardiorenal syndrome required lasix gtt, milrinone and dopamine to diurese. Transferred to CIR on 10/25/13.  Transferred back to [facility] from CIR on 11/27 due to recurrent low output HF (co-ox 46%) with volume overload and respiratory distress.   11/11/4 Venous doppler acute deep DVT involving R peroneal vein 10/24/13 ECHO EF 25% RV ok. Lifevest placed.  Patient diuresed well yesterday on Lasix gtt + milrinone gtt.  Weight down 9 lbs.  CVP 12 this morning with co-ox 63% . K was quite low this morning, Lasix gtt turned down to 10 mg/hr. Breathing better.   Objective:   Weight Range:  Vital Signs:   Temp:  [97.6 F (36.4 C)-98.6 F (37 C)] 97.7 F (36.5 C) (11/29 0801) Pulse Rate:  [72-86] 86 (11/29 0900) Resp:  [10-25] 15 (11/29 0900) BP: (100-126)/(41-58) 106/47 mmHg (11/29 0900) SpO2:  [91 %-100 %] 96 % (11/29 0900) Weight:  [236 lb 5.3 oz (107.2 kg)] 236 lb 5.3 oz (107.2 kg) (11/29 0500) Last BM Date: 10/31/13  Weight change: Filed Weights   10/31/13 1400 11/01/13 0444 11/02/13 0500  Weight: 244 lb 4.3 oz (110.8 kg) 245 lb 13 oz (111.5 kg) 236 lb 5.3  oz (107.2 kg)    Intake/Output:   Intake/Output Summary (Last 24 hours) at 11/02/13 1015 Last data filed at 11/02/13 0900  Gross per 24 hour  Intake 1093.4 ml  Output   5275 ml  Net -4181.6 ml     Physical Exam: General:  Lying in bed NAD.  HEENT: normal Neck: supple. JVP to ear. Carotids 2+ bilat; no bruits. No lymphadenopathy or thryomegaly appreciated. Cor: PMI laterally displaced. Regular rate & rhythm. +s3 Sternal incision approximated.  Lungs: rhonchorous Abdomen: soft, nontender, + mildly distended. No hepatosplenomegaly. No bruits or masses. Good bowel sounds. Extremities: no cyanosis, clubbing, rash, BLE 1+ edema 1/2 up lower legs bilaterally; RUE triple lumen picc Neuro: alert & orientedx3, cranial nerves grossly intact. moves all 4 extremities w/o difficulty. Affect pleasant   Labs: Basic Metabolic Panel:  Recent Labs Lab 10/28/13 0530 10/29/13 0500 10/30/13 0530 11/01/13 0413 11/02/13 0415  NA 129* 128* 130* 130* 132*  K 3.7 3.6 4.3 3.7 2.5*  CL 86* 85* 88* 88* 83*  CO2 33* 33* 30 32 40*  GLUCOSE 93 94 95 235* 274*  BUN 54* 57* 64* 60* 55*  CREATININE 1.63* 1.54* 1.57* 1.30 1.54*  CALCIUM 8.4 8.3* 8.6 8.2* 8.0*    Liver Function Tests: No results found for this basename: AST, ALT, ALKPHOS, BILITOT, PROT, ALBUMIN,  in the last 168 hours No  results found for this basename: LIPASE, AMYLASE,  in the last 168 hours No results found for this basename: AMMONIA,  in the last 168 hours  CBC:  Recent Labs Lab 10/27/13 0500 10/28/13 0530 10/29/13 0500 10/30/13 0530  WBC 9.7 9.3 8.2 7.5  HGB 8.7* 8.5* 8.8* 9.4*  HCT 26.9* 26.6* 27.2* 29.3*  MCV 91.8 91.7 91.9 92.7  PLT 404* 401* 434* 463*    Cardiac Enzymes:  Recent Labs Lab 10/27/13 1514  TROPONINI <0.30    BNP: BNP (last 3 results)  Recent Labs  10/07/13 0400 10/17/13 0415  PROBNP 3092.0* 30734.0*    Imaging: No results found.   Medications:     Scheduled Medications: .  ALPRAZolam  0.25 mg Oral TID AC  . ALPRAZolam  1 mg Oral QHS  . aspirin EC  81 mg Oral Daily  . atorvastatin  80 mg Oral q1800  . cholecalciferol  2,000 Units Oral Daily  . feeding supplement (GLUCERNA SHAKE)  237 mL Oral TID BM  . insulin aspart  0-5 Units Subcutaneous QHS  . insulin aspart  0-9 Units Subcutaneous TID WC  . multivitamin with minerals  1 tablet Oral Daily  . potassium chloride  40 mEq Oral Q4H  . potassium chloride  40 mEq Oral BID  . Rivaroxaban  15 mg Oral BID WC  . sodium chloride  3 mL Intravenous Q12H  . vitamin C  1,000 mg Oral Daily    Infusions: . furosemide (LASIX) infusion 10 mg/hr (11/02/13 0900)  . milrinone 0.25 mcg/kg/min (11/02/13 0900)    PRN Medications: sodium chloride, acetaminophen, ALPRAZolam, docusate sodium, nitroGLYCERIN, ondansetron (ZOFRAN) IV, oxyCODONE, sodium chloride, traMADol   Assessment:   1) Cardiogenic shock 2) A/c systolic HF EF 25% 3) Acute respiratory failure 4) CAD   S/p CABG x 2 10/08/13 5) Anterior STEMI 6) Severe Anxiety 7) RLE DVT   -- switched from coumadin to Xarelto 8) Hypokalemia/hyponatremia 9) CKD  Plan/Discussion:    Doing better overall, weight down 10 lbs.  Good co-ox, still volume overloaded.  CVP 12.  - Continue Lasix gtt 10 mg/hr.  Needs more diuresis, will continue current Lasix for now and replete potassium aggressively.  Repeat BMET this afternoon. - Continue milrinone 0.25.   Continue Xarelto 15 bid for DVT.  Renal function stable, follow closely.   PT consult.    Dalton McLean,MD 10:15 AM

## 2013-11-02 NOTE — Progress Notes (Signed)
CRITICAL VALUE ALERT  Critical value received:  CO2 44  Date of notification:  11/02/2013  Time of notification:  1730  Critical value read back: yes  Nurse who received alert:  Delanna Notice  MD notified (1st page):  [place] on call resident  Time of first page:  1733  MD notified (2nd page):  Time of second page:  Responding MD: Katha Cabal  Time MD responded:  1745

## 2013-11-02 NOTE — Progress Notes (Signed)
Cross Cover Note  AM labs: K 2.5, Bicarb 40  Diuresis has been very brisk on current regiment of metolazone 5mg  BID and lasix gtt @ 20mg /hr and milrinone.  Will hold metolazone, decrease lasix gtt to 10mg /hr given hypoK and contraction and replete K.   Could increase diuretics later on once electrolytes have improved but would like to give a chance for K to improve particularly so as to avoid arrhythmias in setting of milrinone.   Glori Luis, MD

## 2013-11-03 DIAGNOSIS — N179 Acute kidney failure, unspecified: Secondary | ICD-10-CM

## 2013-11-03 LAB — CBC
HCT: 26.8 % — ABNORMAL LOW (ref 39.0–52.0)
Hemoglobin: 9 g/dL — ABNORMAL LOW (ref 13.0–17.0)
MCHC: 33.6 g/dL (ref 30.0–36.0)
MCV: 89.6 fL (ref 78.0–100.0)
RBC: 2.99 MIL/uL — ABNORMAL LOW (ref 4.22–5.81)
RDW: 15 % (ref 11.5–15.5)

## 2013-11-03 LAB — GLUCOSE, CAPILLARY
Glucose-Capillary: 97 mg/dL (ref 70–99)
Glucose-Capillary: 118 mg/dL — ABNORMAL HIGH (ref 70–99)
Glucose-Capillary: 104 mg/dL — ABNORMAL HIGH (ref 70–99)

## 2013-11-03 LAB — CARBOXYHEMOGLOBIN
Methemoglobin: 0.7 % (ref 0.0–1.5)
Total hemoglobin: 9.1 g/dL — ABNORMAL LOW (ref 13.5–18.0)

## 2013-11-03 LAB — BASIC METABOLIC PANEL
Calcium: 8.1 mg/dL — ABNORMAL LOW (ref 8.4–10.5)
Chloride: 84 mEq/L — ABNORMAL LOW (ref 96–112)
Chloride: 83 mEq/L — ABNORMAL LOW (ref 96–112)
Creatinine, Ser: 1.69 mg/dL — ABNORMAL HIGH (ref 0.50–1.35)
GFR calc Af Amer: 43 mL/min — ABNORMAL LOW (ref >90)
GFR calc Af Amer: 41 mL/min — ABNORMAL LOW (ref >90)
GFR calc non Af Amer: 37 mL/min — ABNORMAL LOW (ref >90)

## 2013-11-03 MED ORDER — POTASSIUM CHLORIDE CRYS ER 20 MEQ PO TBCR
40.0000 meq | EXTENDED_RELEASE_TABLET | Freq: Once | ORAL | Status: AC
  Administered 2013-11-03: 40 meq via ORAL
  Filled 2013-11-03: qty 2

## 2013-11-03 MED ORDER — POTASSIUM CHLORIDE CRYS ER 20 MEQ PO TBCR
40.0000 meq | EXTENDED_RELEASE_TABLET | Freq: Three times a day (TID) | ORAL | Status: DC
  Administered 2013-11-03 – 2013-11-04 (×5): 40 meq via ORAL
  Filled 2013-11-03 (×7): qty 2

## 2013-11-03 MED ORDER — POTASSIUM CHLORIDE CRYS ER 20 MEQ PO TBCR
40.0000 meq | EXTENDED_RELEASE_TABLET | Freq: Once | ORAL | Status: AC
  Administered 2013-11-03: 40 meq via ORAL

## 2013-11-03 MED ORDER — POTASSIUM CHLORIDE 10 MEQ/100ML IV SOLN
10.0000 meq | INTRAVENOUS | Status: DC
  Filled 2013-11-03: qty 100

## 2013-11-03 NOTE — Progress Notes (Signed)
Patient ID: Antonio Bernard, male   DOB: 01-16-1933, 79 y.o.   MRN: [id number] Advanced Heart Failure Rounding Note   Subjective:     Antonio Bernard is a 79 yo male with a hx of HTN, severe anxiety, and OSA who presented to the ED 10/05/13 with a STEMI and was taken to the cath lab. Cath revealed proximally occluded LAD - after flow was restored it was apparently there was extensive LAD disease as well as severe 90-95% ostial D1 disease. Balloon angioplasty was performed, but no stent was placed. TIMI 2 flow was noted in the LAD and an IABP was placed. He was then evaluated by TCTS and was taken for a CABG x2 LIMA-LAD; SVG-DIAG on 10/08/13. After CABG he was brought back to the ICU still on IABP and multiple pressors. The balloon pump was weaned off 10/10/13 and he was extubated 10/13/13. Patient had problems with marked volume overload and cardiorenal syndrome required lasix gtt, milrinone and dopamine to diurese. Transferred to CIR on 10/25/13.  Transferred [facility] to [facility] from CIR on 11/27 due to recurrent low output HF (co-ox 46%) with volume overload and respiratory distress.   11/11/4 Venous doppler acute deep DVT involving R peroneal vein 10/24/13 ECHO EF 25% RV ok.   Patient continues to diurese on Lasix gtt + milrinone gtt.  CVP down to 9 this morning with co-ox 67% . K low again this morning.    Objective:   Weight Range:  Vital Signs:   Temp:  [97.6 F (36.4 C)-98 F (36.7 C)] 97.7 F (36.5 C) (11/30 0400) Pulse Rate:  [76-91] 76 (11/30 0700) Resp:  [10-18] 12 (11/30 0700) BP: (82-131)/(30-61) 114/53 mmHg (11/30 0700) SpO2:  [94 %-99 %] 99 % (11/30 0700) Weight:  [236 lb 15.9 oz (107.5 kg)] 236 lb 15.9 oz (107.5 kg) (11/30 0500) Last BM Date: 10/31/13  Weight change: Filed Weights   11/01/13 0444 11/02/13 0500 11/03/13 0500  Weight: 245 lb 13 oz (111.5 kg) 236 lb 5.3 oz (107.2 kg) 236 lb 15.9 oz (107.5 kg)    Intake/Output:   Intake/Output Summary (Last 24 hours) at 11/03/13  0851 Last data filed at 11/03/13 0600  Gross per 24 hour  Intake  861.7 ml  Output   3300 ml  Net -2438.3 ml     Physical Exam: General:  Lying in bed NAD.  HEENT: normal Neck: supple. JVP 8-9 cm. Carotids 2+ bilat; no bruits. No lymphadenopathy or thryomegaly appreciated. Cor: PMI laterally displaced. Regular rate & rhythm. +s3 Sternal incision approximated.  Lungs: rhonchorous Abdomen: soft, nontender, + mildly distended. No hepatosplenomegaly. No bruits or masses. Good bowel sounds. Extremities: no cyanosis, clubbing, rash, BLE 1+ edema ankle edema; RUE triple lumen picc Neuro: alert & orientedx3, cranial nerves grossly intact. moves all 4 extremities w/o difficulty. Affect pleasant   Labs: Basic Metabolic Panel:  Recent Labs Lab 10/30/13 0530 11/01/13 0413 11/02/13 0415 11/02/13 1625 11/03/13 0500  NA 130* 130* 132* 135 133*  K 4.3 3.7 2.5* 3.0* 2.7*  CL 88* 88* 83* 86* 84*  CO2 30 32 40* 44* 44*  GLUCOSE 95 235* 274* 97 176*  BUN 64* 60* 55* 59* 55*  CREATININE 1.57* 1.30 1.54* 1.72* 1.69*  CALCIUM 8.6 8.2* 8.0* 8.8 8.1*    Liver Function Tests: No results found for this basename: AST, ALT, ALKPHOS, BILITOT, PROT, ALBUMIN,  in the last 168 hours No results found for this basename: LIPASE, AMYLASE,  in the last 168 hours No results  found for this basename: AMMONIA,  in the last 168 hours  CBC:  Recent Labs Lab 10/28/13 0530 10/29/13 0500 10/30/13 0530 11/03/13 0500  WBC 9.3 8.2 7.5 6.0  HGB 8.5* 8.8* 9.4* 9.0*  HCT 26.6* 27.2* 29.3* 26.8*  MCV 91.7 91.9 92.7 89.6  PLT 401* 434* 463* 403*    Cardiac Enzymes:  Recent Labs Lab 10/27/13 1514  TROPONINI <0.30    BNP: BNP (last 3 results)  Recent Labs  10/07/13 0400 10/17/13 0415  PROBNP 3092.0* 30734.0*    Imaging: No results found.   Medications:     Scheduled Medications: . ALPRAZolam  0.25 mg Oral TID AC  . ALPRAZolam  1 mg Oral QHS  . aspirin EC  81 mg Oral Daily  .  atorvastatin  80 mg Oral q1800  . cholecalciferol  2,000 Units Oral Daily  . feeding supplement (GLUCERNA SHAKE)  237 mL Oral TID BM  . insulin aspart  0-5 Units Subcutaneous QHS  . insulin aspart  0-9 Units Subcutaneous TID WC  . multivitamin with minerals  1 tablet Oral Daily  . potassium chloride  40 mEq Oral Once  . potassium chloride  40 mEq Oral TID  . Rivaroxaban  15 mg Oral BID WC  . sodium chloride  3 mL Intravenous Q12H  . vitamin C  1,000 mg Oral Daily    Infusions: . furosemide (LASIX) infusion 10 mg/hr (11/02/13 2000)  . milrinone 0.25 mcg/kg/min (11/02/13 2000)    PRN Medications: sodium chloride, acetaminophen, ALPRAZolam, docusate sodium, nitroGLYCERIN, ondansetron (ZOFRAN) IV, oxyCODONE, sodium chloride, traMADol   Assessment:   1) Cardiogenic shock 2) A/c systolic HF EF 25% 3) Acute respiratory failure 4) CAD   S/p CABG x 2 10/08/13 5) Anterior STEMI 6) Severe Anxiety 7) RLE DVT   -- switched from coumadin to Xarelto 8) Hypokalemia/hyponatremia 9) CKD  Plan/Discussion:    Feels better, good co-ox on milrinone, diuresing well. CVP 9.  - Continue Lasix gtt 10 mg/hr today.  Creatinine beginning to rise and CVP better, probably transition to po torsemide tomorrow.  Replete potassium aggressively.  Repeat BMET this afternoon. - Continue milrinone 0.25.  Would plan slow wean, ?if he will need home inotrope.    Continue Xarelto 15 bid for DVT.  Creatinine up to 1.6 today, will need to follow closely.   PT to work with patient today.    Dezyre Hoefer,MD 8:51 AM

## 2013-11-03 NOTE — Progress Notes (Signed)
PT Cancellation Note  Patient Details Name: Antonio Bernard MRN: [id number] DOB: 1933-05-02   Cancelled Treatment:    Reason Eval/Treat Not Completed: Medical issues which prohibited therapy (K 2.7 without order for repletion til 10am)   Toney Sang Beth 11/03/2013, 7:23 AM Delaney Meigs, PT [phone]

## 2013-11-03 NOTE — Progress Notes (Addendum)
CRITICAL VALUE ALERT  Critical value received:  K+ 2.7 and CO2 44  Date of notification:  11/03/2013  Time of notification:  0600  Critical value read back:yes  Nurse who received alert:  Carlyon Prows RN  MD notified (1st page):  Gemma Payor, MD   Time of first page:  0610  MD notified (2nd page):Julius Katha Cabal, MD   Time of second page: 0630  Responding MD:  Gemma Payor, MD   Time MD responded:  [phone]

## 2013-11-03 NOTE — Progress Notes (Signed)
ANTICOAGULATION CONSULT NOTE -follow up  Pharmacy Consult:  Xarelto Indication:  DVT  Allergies  Allergen Reactions  . Demerol [Meperidine] Other (See Comments)    "makes me crazy"  . Lisinopril Diarrhea  . Shellfish Allergy Rash    Patient Measurements: Height: 6\' 1"  (185.4 cm) Weight: 236 lb 15.9 oz (107.5 kg) IBW/kg (Calculated) : 79.9  Vital Signs: Temp: 98.7 F (37.1 C) (11/30 0800) Temp src: Oral (11/30 0800) BP: 120/58 mmHg (11/30 0800) Pulse Rate: 85 (11/30 0800)  Labs:  Recent Labs  11/02/13 0415 11/02/13 1625 11/03/13 0500  HGB  --   --  9.0*  HCT  --   --  26.8*  PLT  --   --  403*  CREATININE 1.54* 1.72* 1.69*    Estimated Creatinine Clearance: 46.3 ml/min (by C-G formula based on Cr of 1.69).  Assessment:  79 YOM on Xarelto for DVT. Switched from comadin to [employer] on 11/01/13.  No bleeding reported. CBC stable. SCr 1.6, CrCl ~ 46 ml/min.   Goal of Therapy:  Treatment and prevention of DVT Monitor platelets by anticoagulation protocol: Yes  Plan:   Continuing Xarelto 15mg  po BID x 21 days then 20mg  daily (20mg  daily will start on 12/19).   Thank you for allowing pharmacy to be a part of this patients care team.  Noah Delaine, RPh Clinical Pharmacist Pager: [phone] 11/03/2013 10:45 AM

## 2013-11-03 NOTE — Progress Notes (Signed)
  Renal function continues to deteriorate over the day. Will stop IV lasix.   K was 2.8 this afternoon. Has received 80 meq since that time. (Cr 1.8). Will not supplement further this evening.  Varnika Butz,MD 11:48 PM

## 2013-11-03 NOTE — Evaluation (Signed)
Physical Therapy Evaluation Patient Details Name: Antonio Bernard MRN: [id number] DOB: 08/31/1933 Today's Date: 11/03/2013 Time: 5284-1324 PT Time Calculation (min): 35 min  PT Assessment / Plan / Recommendation History of Present Illness  Adm 11/01 with MI; 11/05 CABG x 2 and remained on vent until 11/09, history of orthostatic hypotension with repeated syncopal events. D/C to CIR 11/21 and returned to acute 11/27 with CHF and weakness  Clinical Impression  Pt very pleasant and eager to move and return home. Pt states dgtrs can provide 24hr assist and if pt able to achieve Supervision level acutely then D/C home may be likely vs return to CIR to achieve mod I level for return home alone. Pt with multiple medical complexities and mobility limited by hypotension. Pt with dizziness after initial transfer to [facility] and required additional seated rest before being able to ambulate. Will follow acutely to maximize mobility, function and independence to decrease burden of care. Pt unable to recall all precautions even after education and handout provided. Pt incontinent of mucous stool in bed and assist for pericare in bed and after [facility] use, RN aware.    PT Assessment  Patient needs continued PT services    Follow Up Recommendations  Supervision/Assistance - 24 hour;Home health PT;CIR    Does the patient have the potential to tolerate intense rehabilitation      Barriers to Discharge   pt states dgtrs can provide 24hr assist    Equipment Recommendations  None recommended by PT    Recommendations for Other Services OT consult   Frequency Min 3X/week    Precautions / Restrictions Precautions Precautions: Sternal;Fall Precaution Comments: history of syncope with PT.   Pertinent Vitals/Pain 120/58 supine Hr 85 94/51 (64) on BSC 109/48 EOB after BSC use sats 93-97% on 3L with drop to 89% on 2L No pain     Mobility  Bed Mobility Bed Mobility: Rolling Right;Right Sidelying to  Sit;Sitting - Scoot to [place] of Bed Rolling Right: 4: Min assist Right Sidelying to Sit: 4: Min assist;HOB flat Sitting - Scoot to Edge of Bed: 5: Supervision Details for Bed Mobility Assistance: cueing for sequence to maintain sternal precautions with assist to elevate trunk from surface Transfers Transfers: Stand Pivot Transfers Sit to Stand: 4: Min guard;From bed;From elevated surface;From chair/3-in-1 Stand to Sit: 4: Min guard;To chair/3-in-1 Stand Pivot Transfers: 4: Min assist Details for Transfer Assistance: cues for hand placement on thighs and safety. Pt transferred bed to [facility] then back to EOB due to dizziness  Ambulation/Gait Ambulation/Gait Assistance: 4: Min guard Ambulation Distance (Feet): 15 Feet Assistive device: Rolling walker Ambulation/Gait Assistance Details: cues to avoid obstacles and for posture Gait Pattern: Step-through pattern;Decreased stride length;Wide base of support Gait velocity: decreased Stairs: No    Exercises General Exercises - Lower Extremity Long Arc Quad: AROM;Seated;Both;10 reps Hip Flexion/Marching: AROM;Seated;Both;10 reps   PT Diagnosis: Difficulty walking;Generalized weakness  PT Problem List: Decreased strength;Decreased activity tolerance;Decreased balance;Decreased mobility;Decreased knowledge of use of DME;Decreased knowledge of precautions PT Treatment Interventions: DME instruction;Gait training;Stair training;Functional mobility training;Therapeutic activities;Therapeutic exercise;Patient/family education     PT Goals(Current goals can be found in the care plan section) Acute Rehab PT Goals Patient Stated Goal: return to the [place] PT Goal Formulation: With patient/family Time For Goal Achievement: 11/17/13 Potential to Achieve Goals: Good  Visit Information  Last PT Received On: 11/03/13 Assistance Needed: +1 Reason Eval/Treat Not Completed: Medical issues which prohibited therapy (K 2.7 without order for repletion til  10am) History of Present  Illness: Adm 11/01 with MI; 11/05 CABG x 2 and remained on vent until 11/09, history of orthostatic hypotension with repeated syncopal events. 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 Dominant Hand: Right    Cognition  Cognition Arousal/Alertness: Awake/alert Behavior During Therapy: WFL for tasks assessed/performed Overall Cognitive Status: Within Functional Limits for tasks assessed    Extremity/Trunk Assessment Upper Extremity Assessment Upper Extremity Assessment: Generalized weakness Lower Extremity Assessment Lower Extremity Assessment: Generalized weakness Cervical / Trunk Assessment Cervical / Trunk Assessment: Normal   Balance Static Sitting Balance Static Sitting - Balance Support: No upper extremity supported;Feet supported Static Sitting - Level of Assistance: 6: Modified independent (Device/Increase time)  End of Session PT - End of Session Equipment Utilized During Treatment: Gait belt Activity Tolerance: Patient tolerated treatment well Patient left: in chair;with call bell/phone within reach;with family/visitor present;with nursing/sitter in room Nurse Communication: Mobility status  GP     Delorse Lek 11/03/2013, 10:45 AM Delaney Meigs, PT [phone]

## 2013-11-04 LAB — CARBOXYHEMOGLOBIN
Carboxyhemoglobin: 2.6 % — ABNORMAL HIGH (ref 0.5–1.5)
Carboxyhemoglobin: 1.8 % — ABNORMAL HIGH (ref 0.5–1.5)
Carboxyhemoglobin: 2.2 % — ABNORMAL HIGH (ref 0.5–1.5)
Methemoglobin: 1.5 % (ref 0.0–1.5)
Methemoglobin: 1.5 % (ref 0.0–1.5)
O2 Saturation: 75.9 %
O2 Saturation: 55 %
Total hemoglobin: 9.4 g/dL — ABNORMAL LOW (ref 13.5–18.0)

## 2013-11-04 LAB — GLUCOSE, CAPILLARY
Glucose-Capillary: >600 mg/dL (ref 70–99)
Glucose-Capillary: 110 mg/dL — ABNORMAL HIGH (ref 70–99)
Glucose-Capillary: 101 mg/dL — ABNORMAL HIGH (ref 70–99)

## 2013-11-04 LAB — BASIC METABOLIC PANEL
BUN: 52 mg/dL — ABNORMAL HIGH (ref 6–23)
CO2: 44 mEq/L (ref 19–32)
Creatinine, Ser: 1.65 mg/dL — ABNORMAL HIGH (ref 0.50–1.35)
GFR calc Af Amer: 44 mL/min — ABNORMAL LOW (ref >90)
GFR calc non Af Amer: 38 mL/min — ABNORMAL LOW (ref >90)
Glucose, Bld: 96 mg/dL (ref 70–99)
Potassium: 2.8 mEq/L — ABNORMAL LOW (ref 3.5–5.1)

## 2013-11-04 LAB — CBC
Hemoglobin: 9.4 g/dL — ABNORMAL LOW (ref 13.0–17.0)
MCH: 29.9 pg (ref 26.0–34.0)
MCHC: 33.3 g/dL (ref 30.0–36.0)
RDW: 15.2 % (ref 11.5–15.5)

## 2013-11-04 MED ORDER — SODIUM CHLORIDE 0.9 % IJ SOLN
10.0000 mL | INTRAMUSCULAR | Status: DC | PRN
  Administered 2013-11-04: 20 mL
  Administered 2013-11-07 – 2013-11-08 (×2): 10 mL

## 2013-11-04 MED ORDER — HEPARIN SOD (PORK) LOCK FLUSH 100 UNIT/ML IV SOLN: 500.0000 [IU] | INTRAVENOUS | Status: DC | PRN

## 2013-11-04 MED ORDER — ALTEPLASE 2 MG IJ SOLR
2.0000 mg | Freq: Once | INTRAMUSCULAR | Status: AC
  Administered 2013-11-04: 2 mg
  Filled 2013-11-04: qty 2

## 2013-11-04 MED ORDER — SODIUM CHLORIDE 0.9 % IJ SOLN
10.0000 mL | Freq: Two times a day (BID) | INTRAMUSCULAR | Status: DC
  Administered 2013-11-04 – 2013-11-07 (×4): 10 mL

## 2013-11-04 MED ORDER — DIGOXIN 125 MCG PO TABS
0.1250 mg | ORAL_TABLET | Freq: Every day | ORAL | Status: DC
  Administered 2013-11-04 – 2013-11-08 (×5): 0.125 mg via ORAL
  Filled 2013-11-04 (×5): qty 1

## 2013-11-04 MED ORDER — POTASSIUM CHLORIDE CRYS ER 20 MEQ PO TBCR
40.0000 meq | EXTENDED_RELEASE_TABLET | Freq: Once | ORAL | Status: AC
  Administered 2013-11-04: 40 meq via ORAL
  Filled 2013-11-04: qty 2

## 2013-11-04 MED ORDER — SPIRONOLACTONE 12.5 MG HALF TABLET
12.5000 mg | ORAL_TABLET | Freq: Every day | ORAL | Status: DC
  Administered 2013-11-04 – 2013-11-06 (×3): 12.5 mg via ORAL
  Filled 2013-11-04 (×4): qty 1

## 2013-11-04 NOTE — Progress Notes (Signed)
Rehab admissions - Noted patient would like to discharge directly home and not return to acute inpatient rehab.  I will not plan to re admit to acute inpatient rehab at this time.  Call me for questions.  #[phone]

## 2013-11-04 NOTE — Progress Notes (Signed)
Patient ID: Antonio Bernard, male   DOB: [birth year], 79 y.o.   MRN: [id number] Advanced Heart Failure Rounding Note   Subjective:     Antonio Bernard is a 79 yo male with a hx of HTN, severe anxiety, and OSA who presented to the ED 10/05/13 with a STEMI and was taken to the cath lab. Cath revealed proximally occluded LAD - after flow was restored it was apparently there was extensive LAD disease as well as severe 90-95% ostial D1 disease. Balloon angioplasty was performed, but no stent was placed. TIMI 2 flow was noted in the LAD and an IABP was placed. He was then evaluated by TCTS and was taken for a CABG x2 LIMA-LAD; SVG-DIAG on 10/08/13. After CABG he was brought back to the ICU still on IABP and multiple pressors. The balloon pump was weaned off 10/10/13 and he was extubated 10/13/13. Patient had problems with marked volume overload and cardiorenal syndrome required lasix gtt, milrinone and dopamine to diurese. Transferred to CIR on 10/25/13.  Transferred back to [facility] from CIR on 11/27 due to recurrent low output HF (co-ox 46%) with volume overload and respiratory distress.   11/11/4 Venous doppler acute deep DVT involving R peroneal vein 10/24/13 ECHO EF 25% RV ok.   Yesterday lasix drip stopped due to creatinine bump and Milrinone was cut back to 0.125 mcg. He continued on Milrinone. Weight down another 3 pounds. Overall weight down 11 pounds.   Overall feeling much better. Denies SOB/Orthopnea.   CO-OX 67>75% K 2.8 CVP ~3   Objective:   Weight Range:  Vital Signs:   Temp:  [97.7 F (36.5 C)-98.7 F (37.1 C)] 97.7 F (36.5 C) (12/01 0307) Pulse Rate:  [74-91] 81 (12/01 0500) Resp:  [11-20] 11 (12/01 0500) BP: (96-129)/(40-65) 103/43 mmHg (12/01 0500) SpO2:  [95 %-100 %] 99 % (12/01 0500) Weight:  [233 lb 0.4 oz (105.7 kg)] 233 lb 0.4 oz (105.7 kg) (12/01 0544) Last BM Date: 11/03/13  Weight change: Filed Weights   11/02/13 0500 11/03/13 0500 11/04/13 0544  Weight: 236 lb 5.3  oz (107.2 kg) 236 lb 15.9 oz (107.5 kg) 233 lb 0.4 oz (105.7 kg)    Intake/Output:   Intake/Output Summary (Last 24 hours) at 11/04/13 0700 Last data filed at 11/04/13 0500  Gross per 24 hour  Intake 700.36 ml  Output   1975 ml  Net -1274.64 ml     Physical Exam: General:  Lying in bed NAD.  HEENT: normal Neck: supple. JVP flat Carotids 2+ bilat; no bruits. No lymphadenopathy or thryomegaly appreciated. Cor: PMI laterally displaced. Regular rate & rhythm. +s3 Sternal incision approximated.  Lungs: rhonchorous Abdomen: soft, nontender, + mildly distended. No hepatosplenomegaly. No bruits or masses. Good bowel sounds. Extremities: no cyanosis, clubbing, rash, RLE 1+ edema. No edema LLE. BLE  Ted hose.  RUE triple lumen picc Neuro: alert & orientedx3, cranial nerves grossly intact. moves all 4 extremities w/o difficulty. Affect pleasant   Labs: Basic Metabolic Panel:  Recent Labs Lab 11/02/13 0415 11/02/13 1625 11/03/13 0500 11/03/13 1548 11/04/13 0447  NA 132* 135 133* 133* 136  K 2.5* 3.0* 2.7* 2.8* 2.8*  CL 83* 86* 84* 83* 87*  CO2 40* 44* 44* 41* 44*  GLUCOSE 274* 97 176* 103* 96  BUN 55* 59* 55* 54* 52*  CREATININE 1.54* 1.72* 1.69* 1.77* 1.65*  CALCIUM 8.0* 8.8 8.1* 8.6 8.4    Liver Function Tests: No results found for this basename: AST, ALT, ALKPHOS, BILITOT, PROT,  ALBUMIN,  in the last 168 hours No results found for this basename: LIPASE, AMYLASE,  in the last 168 hours No results found for this basename: AMMONIA,  in the last 168 hours  CBC:  Recent Labs Lab 10/29/13 0500 10/30/13 0530 11/03/13 0500 11/04/13 0447  WBC 8.2 7.5 6.0 6.5  HGB 8.8* 9.4* 9.0* 9.4*  HCT 27.2* 29.3* 26.8* 28.2*  MCV 91.9 92.7 89.6 89.8  PLT 434* 463* 403* 399    Cardiac Enzymes: No results found for this basename: CKTOTAL, CKMB, CKMBINDEX, TROPONINI,  in the last 168 hours  BNP: BNP (last 3 results)  Recent Labs  10/07/13 0400 10/17/13 0415  PROBNP 3092.0*  30734.0*    Imaging: No results found.   Medications:     Scheduled Medications: . ALPRAZolam  0.25 mg Oral TID AC  . ALPRAZolam  1 mg Oral QHS  . aspirin EC  81 mg Oral Daily  . atorvastatin  80 mg Oral q1800  . cholecalciferol  2,000 Units Oral Daily  . feeding supplement (GLUCERNA SHAKE)  237 mL Oral TID BM  . insulin aspart  0-5 Units Subcutaneous QHS  . insulin aspart  0-9 Units Subcutaneous TID WC  . multivitamin with minerals  1 tablet Oral Daily  . potassium chloride  40 mEq Oral TID  . Rivaroxaban  15 mg Oral BID WC  . sodium chloride  3 mL Intravenous Q12H  . vitamin C  1,000 mg Oral Daily    Infusions: . milrinone 0.125 mcg/kg/min (11/04/13 0452)    PRN Medications: sodium chloride, acetaminophen, ALPRAZolam, docusate sodium, nitroGLYCERIN, ondansetron (ZOFRAN) IV, oxyCODONE, sodium chloride, traMADol   Assessment:   1) Cardiogenic shock 2) A/c systolic HF EF 25% 3) Acute respiratory failure 4) CAD   S/p CABG x 2 10/08/13 5) Anterior STEMI 6) Severe Anxiety 7) RLE DVT   -- switched from coumadin to Xarelto 8) Hypokalemia/hyponatremia 9) CKD  Plan/Discussion:    Volume status low. CVP ~3. Hold diuretics. Stop Milrinone. Repeat CO-OX at 1200.   Hold off on beta blocker. HR < 60 and BP soft.  Replace potassium, should come up off diuretics.   Continue Xarelto 15 bid for DVT.   Continue to mobilize. PT following.     CLEGG,AMY,NP-C 7:00 AM  Patient seen and examined with Tonye Becket, NP. We discussed all aspects of the encounter. I agree with the assessment and plan as stated above.   He looks much better. He is euvolemic. Co-ox is good. Not wanting to go back to CIR - wants to go home with rehab.  We have two main goals prior to d/c at this point:  1) To ensure that we can get him on stable HF regimen to maintain volume status and renal function. ? Is whether he will need milrinone. 2) Aggressive PT   Will start low-dose spiro and digoxin.  Hope to add enalapril 2.5 bid tomorrow if BP tolerates. No b-blocker for now. Keep in SDU to follow CVPs and co-ox to help optimize.  Daniel Bensimhon,MD 8:26 AM

## 2013-11-04 NOTE — Progress Notes (Signed)
Physical Therapy Treatment Patient Details Name: PAITON FOSCO MRN: [id number] DOB: 12-21-1932 Today's Date: 11/04/2013 Time: 3664-4034 PT Time Calculation (min): 30 min  PT Assessment / Plan / Recommendation  History of Present Illness Adm 11/01 with MI; 11/05 CABG x 2 and remained on vent until 11/09, history of orthostatic hypotension with repeated syncopal events. D/C to CIR 11/21 and returned to acute 11/27 with CHF and weakness   PT Comments   Pt with excellent progression this date, able to ambulate in the hall x 2 and aware of need to void today. Pt remains unable to recall all precautions and educated for all with redirection to handouts again. Pt states he has been performing HEP and able to demonstrate exercises with encouragement to continue. Will follow.   Follow Up Recommendations  Home health PT;Supervision/Assistance - 24 hour     Does the patient have the potential to tolerate intense rehabilitation     Barriers to Discharge        Equipment Recommendations       Recommendations for Other Services    Frequency     Progress towards PT Goals Progress towards PT goals: Progressing toward goals  Plan Current plan remains appropriate    Precautions / Restrictions Precautions Precautions: Sternal;Fall Precaution Comments: history of syncope with PT.   Pertinent Vitals/Pain No pain 108/57 sitting 94/65 standing HR 84-92 sats 95% on RA   Mobility  Bed Mobility Bed Mobility: Not assessed Transfers Sit to Stand: 4: Min guard;From chair/3-in-1;From toilet Stand to Sit: To chair/3-in-1;To toilet Ambulation/Gait Ambulation/Gait Assistance: 4: Min guard Ambulation Distance (Feet): 200 Feet Assistive device: Rolling walker Ambulation/Gait Assistance Details: cueing for activity regulation. 120' first trial then 200' after voiding Gait Pattern: Step-through pattern;Decreased stride length Gait velocity: decreased Stairs: No    Exercises     PT Diagnosis:     PT Problem List:   PT Treatment Interventions:     PT Goals (current goals can now be found in the care plan section)    Visit Information  Last PT Received On: 11/04/13 Assistance Needed: +1 History of Present Illness: Adm 11/01 with MI; 11/05 CABG x 2 and remained on vent until 11/09, history of orthostatic hypotension with repeated syncopal events. D/C to CIR 11/21 and returned to acute 11/27 with CHF and weakness    Subjective Data      Cognition  Cognition Arousal/Alertness: Awake/alert Behavior During Therapy: [facility] for tasks assessed/performed Overall Cognitive Status: Within Functional Limits for tasks assessed    Balance     End of Session PT - End of Session Equipment Utilized During Treatment: Gait belt Activity Tolerance: Patient tolerated treatment well Patient left: in chair;with call bell/phone within reach;with family/visitor present Nurse Communication: Mobility status   GP     Delorse Lek 11/04/2013, 1:14 PM Delaney Meigs, PT [phone]

## 2013-11-04 NOTE — Progress Notes (Signed)
Telephoned Dr. Gala Romney with co ox results.  Orders received to keep milrinone off and repeat co ox in the am

## 2013-11-04 NOTE — Evaluation (Signed)
Occupational Therapy Evaluation Patient Details Name: Antonio Bernard MRN: [id number] DOB: [birth year] Today's Date: 11/04/2013 Time: 4098-1191 OT Time Calculation (min): 21 min  OT Assessment / Plan / Recommendation History of present illness Adm 11/01 with MI; 11/05 CABG x 2 and remained on vent until 11/09, history of orthostatic hypotension with repeated syncopal events. D/C to CIR 11/21 and returned to acute 11/27 with CHF and weakness   Clinical Impression   Pt admitted with above.  He is presents to OT with the below listed deficits.  His plan is to discharge home from acute with dtrs providing 24 hour assist/supervision.  He will benefit from continued OT to maximize safety and independence with BADLs. Recommend HHOT at discharge    OT Assessment  Patient needs continued OT Services    Follow Up Recommendations  Home health OT;Supervision/Assistance - 24 hour    Barriers to Discharge      Equipment Recommendations  3 in 1 bedside comode    Recommendations for Other Services    Frequency  Min 2X/week    Precautions / Restrictions Precautions Precautions: Sternal;Fall Precaution Comments: history of syncope with PT.   Pertinent Vitals/Pain     ADL  Eating/Feeding: Independent Where Assessed - Eating/Feeding: Chair Grooming: Wash/dry hands;Wash/dry face;Teeth care;Brushing hair;Min guard Where Assessed - Grooming: Unsupported standing Upper Body Bathing: Supervision/safety Where Assessed - Upper Body Bathing: Unsupported sitting Lower Body Bathing: Moderate assistance Where Assessed - Lower Body Bathing: Unsupported sit to stand Upper Body Dressing: Set up;Supervision/safety Where Assessed - Upper Body Dressing: Unsupported sitting Lower Body Dressing: Moderate assistance Where Assessed - Lower Body Dressing: Unsupported sit to stand Toilet Transfer: Minimal assistance Toilet Transfer Method: Sit to stand;Stand pivot Toilet Transfer Equipment: Raised toilet  seat with arms (or 3-in-1 over toilet) Toileting - Clothing Manipulation and Hygiene: Maximal assistance (due to lines) Where Assessed - Toileting Clothing Manipulation and Hygiene: Standing Equipment Used: Rolling walker Transfers/Ambulation Related to ADLs: min guard assist ADL Comments: Pt demonstrates difficulty accessing feet for LB ADLs.  Discussed options for use of AE and pt receptive.  He fatigues quickly with standing activities    OT Diagnosis: Generalized weakness  OT Problem List: Decreased strength;Decreased activity tolerance;Impaired balance (sitting and/or standing);Decreased knowledge of use of DME or AE;Decreased knowledge of precautions OT Treatment Interventions: Self-care/ADL training;DME and/or AE instruction;Therapeutic activities;Patient/family education   OT Goals(Current goals can be found in the care plan section) Acute Rehab OT Goals Patient Stated Goal: return to the [place] OT Goal Formulation: With patient Time For Goal Achievement: 11/14/13 Potential to Achieve Goals: Good ADL Goals Pt Will Perform Grooming: with supervision;standing Pt Will Perform Lower Body Bathing: with supervision;sit to/from stand;with adaptive equipment Pt Will Perform Lower Body Dressing: with supervision;with adaptive equipment;sit to/from stand Pt Will Transfer to Toilet: with supervision;regular height toilet;bedside commode;ambulating Pt Will Perform Toileting - Clothing Manipulation and hygiene: with supervision;sit to/from stand;with adaptive equipment Pt Will Perform Tub/Shower Transfer: with supervision;ambulating;shower seat;rolling walker Additional ADL Goal #1: Pt will be independent with sternal precautions during BADLs  Visit Information  Last OT Received On: 11/04/13 Assistance Needed: +1 History of Present Illness: Adm 11/01 with MI; 11/05 CABG x 2 and remained on vent until 11/09, history of orthostatic hypotension with repeated syncopal events. D/C to CIR 11/21  and returned to acute 11/27 with CHF and weakness       Prior Functioning     Home Living Family/patient expects to be discharged to:: Private residence Living Arrangements: Alone Available Help  at Discharge: Family Type of Home: House Home Access: Stairs to enter [employer] of Steps: 1+1, 4" steps Entrance Stairs-Rails: None Home Layout: Laundry or work area in basement;Able to live on main level with bedroom/bathroom Home Equipment: Environmental consultant - 2 wheels Additional Comments: Pt and dtr report that he will have 24 hour supervision/assist at discharge.  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: Right         Vision/Perception Vision - History Baseline Vision: Wears glasses all the time Patient Visual Report: No change from baseline   Cognition  Cognition Arousal/Alertness: Awake/alert Behavior During Therapy: WFL for tasks assessed/performed Overall Cognitive Status: Within Functional Limits for tasks assessed    Extremity/Trunk Assessment Upper Extremity Assessment Upper Extremity Assessment: Overall WFL for tasks assessed Lower Extremity Assessment Lower Extremity Assessment: Defer to PT evaluation Cervical / Trunk Assessment Cervical / Trunk Assessment: Normal     Mobility Bed Mobility Bed Mobility: Not assessed Transfers Transfers: Sit to Stand;Stand to Sit Sit to Stand: 4: Min assist;Without upper extremity assist;From chair/3-in-1 Stand to Sit: 4: Min assist;With upper extremity assist;To chair/3-in-1 Details for Transfer Assistance: Pt fatigued and required min A to move sit to stand with OT     Exercise     Balance Static Standing Balance Static Standing - Balance Support: Bilateral upper  extremity supported Static Standing - Level of Assistance: 4: Min assist   End of Session OT - End of Session Activity Tolerance: Patient limited by fatigue Patient left: in chair;with call bell/phone within reach;with family/visitor present Nurse Communication: Mobility status  GO     Dhiren Azimi M 11/04/2013, 2:04 PM

## 2013-11-05 LAB — CARBOXYHEMOGLOBIN
Carboxyhemoglobin: 2.2 % — ABNORMAL HIGH (ref 0.5–1.5)
Methemoglobin: 0.7 % (ref 0.0–1.5)
O2 Saturation: 61.7 %
Total hemoglobin: 10.3 g/dL — ABNORMAL LOW (ref 13.5–18.0)

## 2013-11-05 LAB — GLUCOSE, CAPILLARY
Glucose-Capillary: 92 mg/dL (ref 70–99)
Glucose-Capillary: 91 mg/dL (ref 70–99)

## 2013-11-05 MED ORDER — MAGNESIUM SULFATE 40 MG/ML IJ SOLN
2.0000 g | Freq: Once | INTRAMUSCULAR | Status: AC
  Administered 2013-11-05: 2 g via INTRAVENOUS
  Filled 2013-11-05: qty 50

## 2013-11-05 MED ORDER — POTASSIUM CHLORIDE CRYS ER 20 MEQ PO TBCR
40.0000 meq | EXTENDED_RELEASE_TABLET | Freq: Two times a day (BID) | ORAL | Status: DC
  Administered 2013-11-05 (×2): 40 meq via ORAL
  Filled 2013-11-05 (×2): qty 2

## 2013-11-05 NOTE — Progress Notes (Signed)
Patient ID: Antonio Bernard, male   DOB: 09/18/1933, 79 y.o.   MRN: [id number] Advanced Heart Failure Rounding Note   Subjective:     Antonio Bernard is a 79 yo male with a hx of HTN, severe anxiety, and OSA who presented to the ED 10/05/13 with a STEMI and was taken to the cath lab. Cath revealed proximally occluded LAD - after flow was restored it was apparently there was extensive LAD disease as well as severe 90-95% ostial D1 disease. Balloon angioplasty was performed, but no stent was placed. TIMI 2 flow was noted in the LAD and an IABP was placed. He was then evaluated by TCTS and was taken for a CABG x2 LIMA-LAD; SVG-DIAG on 10/08/13. After CABG he was brought back to the ICU still on IABP and multiple pressors. The balloon pump was weaned off 10/10/13 and he was extubated 10/13/13. Patient had problems with marked volume overload and cardiorenal syndrome required lasix gtt, milrinone and dopamine to diurese. Transferred to CIR on 10/25/13.  Transferred back to [facility] from CIR on 11/27 due to recurrent low output HF (co-ox 46%) with volume overload and respiratory distress.   11/11/4 Venous doppler acute deep DVT involving R peroneal vein 10/24/13 ECHO EF 25% RV ok.   Yesterday lasix drip stopped due to creatinine bump and Milrinone was cut back to 0.125 mcg. Yesterday Milrinone was stopped and digoxin 0.125 mg and Spiro 12.5 mg daily started. Weight unchanged. Overall weight down 11 pounds.    Denies SOB/Orthopnea.   CO-OX 67>75>33>55>61% K 2.8>3.6  CVP ~2   Objective:   Weight Range:  Vital Signs:   Temp:  [97.8 F (36.6 C)-98.4 F (36.9 C)] 98.4 F (36.9 C) (12/02 0423) Pulse Rate:  [35-92] 75 (12/02 0415) Resp:  [15-24] 15 (12/02 0415) BP: (87-142)/(41-69) 87/43 mmHg (12/02 0415) SpO2:  [95 %-100 %] 98 % (12/02 0415) Weight:  [233 lb 7.5 oz (105.9 kg)] 233 lb 7.5 oz (105.9 kg) (12/02 0423) Last BM Date: 10/31/13 (except mucousy stools)  Weight change: Filed Weights   11/04/13 0544 11/04/13 2325 11/05/13 0423  Weight: 233 lb 0.4 oz (105.7 kg) 233 lb 7.5 oz (105.9 kg) 233 lb 7.5 oz (105.9 kg)    Intake/Output:   Intake/Output Summary (Last 24 hours) at 11/05/13 0800 Last data filed at 11/05/13 0600  Gross per 24 hour  Intake    480 ml  Output   1450 ml  Net   -970 ml     Physical Exam: CVP ~2 General:  Lying in bed NAD.  HEENT: normal Neck: supple. JVP flat Carotids 2+ bilat; no bruits. No lymphadenopathy or thryomegaly appreciated. Cor: PMI laterally displaced. Regular rate & rhythm. +s3 Sternal incision approximated.  Lungs: rhonchorous Abdomen: soft, nontender, + mildly distended. No hepatosplenomegaly. No bruits or masses. Good bowel sounds. Extremities: no cyanosis, clubbing, rash, RLE trace edema. No edema LLE. BLE  Ted hose.  RUE triple lumen picc Neuro: alert & orientedx3, cranial nerves grossly intact. moves all 4 extremities w/o difficulty. Affect pleasant   Labs: Basic Metabolic Panel:  Recent Labs Lab 11/02/13 1625 11/03/13 0500 11/03/13 1548 11/04/13 0447 11/05/13 0455  NA 135 133* 133* 136 134*  K 3.0* 2.7* 2.8* 2.8* 3.6  CL 86* 84* 83* 87* 88*  CO2 44* 44* 41* 44* 41*  GLUCOSE 97 176* 103* 96 85  BUN 59* 55* 54* 52* 44*  CREATININE 1.72* 1.69* 1.77* 1.65* 1.34  CALCIUM 8.8 8.1* 8.6 8.4 8.3*  MG  --   --   --   --  1.9    Liver Function Tests: No results found for this basename: AST, ALT, ALKPHOS, BILITOT, PROT, ALBUMIN,  in the last 168 hours No results found for this basename: LIPASE, AMYLASE,  in the last 168 hours No results found for this basename: AMMONIA,  in the last 168 hours  CBC:  Recent Labs Lab 10/30/13 0530 11/03/13 0500 11/04/13 0447  WBC 7.5 6.0 6.5  HGB 9.4* 9.0* 9.4*  HCT 29.3* 26.8* 28.2*  MCV 92.7 89.6 89.8  PLT 463* 403* 399    Cardiac Enzymes: No results found for this basename: CKTOTAL, CKMB, CKMBINDEX, TROPONINI,  in the last 168 hours  BNP: BNP (last 3 results)  Recent  Labs  10/07/13 0400 10/17/13 0415  PROBNP 3092.0* 30734.0*    Imaging: No results found.   Medications:     Scheduled Medications: . ALPRAZolam  0.25 mg Oral TID AC  . ALPRAZolam  1 mg Oral QHS  . aspirin EC  81 mg Oral Daily  . atorvastatin  80 mg Oral q1800  . cholecalciferol  2,000 Units Oral Daily  . digoxin  0.125 mg Oral Daily  . feeding supplement (GLUCERNA SHAKE)  237 mL Oral TID BM  . insulin aspart  0-5 Units Subcutaneous QHS  . insulin aspart  0-9 Units Subcutaneous TID WC  . multivitamin with minerals  1 tablet Oral Daily  . potassium chloride  40 mEq Oral TID  . Rivaroxaban  15 mg Oral BID WC  . sodium chloride  10-40 mL Intracatheter Q12H  . sodium chloride  3 mL Intravenous Q12H  . spironolactone  12.5 mg Oral Daily  . vitamin C  1,000 mg Oral Daily    Infusions:    PRN Medications: sodium chloride, acetaminophen, ALPRAZolam, docusate sodium, nitroGLYCERIN, ondansetron (ZOFRAN) IV, oxyCODONE, sodium chloride, sodium chloride, traMADol   Assessment:   1) Cardiogenic shock 2) A/c systolic HF EF 25% 3) Acute respiratory failure 4) CAD   S/p CABG x 2 10/08/13 5) Anterior STEMI 6) Severe Anxiety 7) RLE DVT   -- switched from coumadin to Xarelto 8) Hypokalemia/hyponatremia 9) CKD  Plan/Discussion:    Volume status remains low. CVP ~2. Hold diuretics. CO-OX 61%. Continue spironolactone 12.5 mg daily and digoxin 0.25 mg daily. Hold off on ace due to soft BP.    Continue Xarelto 15 bid for DVT.  HH set up for RN/PT/OT. Transfer to telemetry. Anticipated d/c in day or so.     CLEGG,AMY,NP-C 8:00 AM  Patient seen and examined with Tonye Becket, NP. We discussed all aspects of the encounter. I agree with the assessment and plan as stated above.   Doing very well off milrinone. Volume status low. Co-ox good. BP soft. Will continue to hold diuretics. Keep in SDU so we can follow co-ox and CVP to optimize for another 48 hours. Continue PT/OT. Hopefully  home by end of week.   Laini Urick,MD 8:54 AM

## 2013-11-05 NOTE — Progress Notes (Signed)
Occupational Therapy Treatment Patient Details Name: Antonio Bernard MRN: [id number] DOB: 1933/08/17 Today's Date: 11/05/2013 Time: 1120-1150 OT Time Calculation (min): 30 min  OT Assessment / Plan / Recommendation  History of present illness Adm 11/01 with MI; 11/05 CABG x 2 and remained on vent until 11/09, history of orthostatic hypotension with repeated syncopal events. D/C to CIR 11/21 and returned to acute 11/27 with CHF and weakness   OT comments  Pt demonstrates improved independence with BADLs.  He is now min guard assist.  Pt progressing well.  Plan for home with assist for dtrs at discharge.   Follow Up Recommendations  Home health OT;Supervision/Assistance - 24 hour    Barriers to Discharge       Equipment Recommendations  3 in 1 bedside comode    Recommendations for Other Services    Frequency Min 2X/week   Progress towards OT Goals Progress towards OT goals: Progressing toward goals  Plan Discharge plan remains appropriate    Precautions / Restrictions Precautions Precautions: Sternal;Fall Precaution Comments: history of syncope with PT. Restrictions Weight Bearing Restrictions: No   Pertinent Vitals/Pain     ADL  Lower Body Dressing: Min guard Where Assessed - Lower Body Dressing: Unsupported sit to stand Toilet Transfer: Min Pension scheme manager Method: Sit to stand;Stand pivot Acupuncturist: Comfort height toilet Toileting - Clothing Manipulation and Hygiene: Min guard Where Assessed - Toileting Clothing Manipulation and Hygiene: Standing Transfers/Ambulation Related to ADLs: min guard assist ADL Comments: Pt able to don/doff socks today.  Worked on sit to stand without UE support - was able to perform multiple times with min guard assist    OT Diagnosis:    OT Problem List:   OT Treatment Interventions:     OT Goals(current goals can now be found in the care plan section) ADL Goals Pt Will Perform Grooming: with  supervision;standing Pt Will Perform Upper Body Bathing: with set-up;sitting Pt Will Perform Lower Body Bathing: with supervision;sit to/from stand;with adaptive equipment Pt Will Perform Upper Body Dressing: with set-up;sitting Pt Will Perform Lower Body Dressing: with supervision;with adaptive equipment;sit to/from stand Pt Will Transfer to Toilet: with supervision;regular height toilet;bedside commode;ambulating Pt Will Perform Toileting - Clothing Manipulation and hygiene: with supervision;sit to/from stand;with adaptive equipment Pt Will Perform Tub/Shower Transfer: with supervision;ambulating;shower seat;rolling walker Additional ADL Goal #1: Pt will be independent with sternal precautions during BADLs  Visit Information  Last OT Received On: 11/05/13 History of Present Illness: Adm 11/01 with MI; 11/05 CABG x 2 and remained on vent until 11/09, history of orthostatic hypotension with repeated syncopal events. D/C to CIR 11/21 and returned to acute 11/27 with CHF and weakness    Subjective Data      Prior Functioning       Cognition  Cognition Arousal/Alertness: Awake/alert Behavior During Therapy: WFL for tasks assessed/performed Overall Cognitive Status: Within Functional Limits for tasks assessed    Mobility  Bed Mobility Bed Mobility: Not assessed Transfers Transfers: Sit to Stand;Stand to Sit Sit to Stand: 4: Min guard;Without upper extremity assist;From chair/3-in-1;From toilet Stand to Sit: 4: Min guard;Without upper extremity assist;To chair/3-in-1;To toilet Details for Transfer Assistance: Pt consistently able to move sit to stand without UE support with min guard assist    Exercises      Balance Balance Balance Assessed: Yes Dynamic Standing Balance Dynamic Standing - Balance Support: No upper extremity supported Dynamic Standing - Level of Assistance: Other (comment) (min guard assist) Dynamic Standing - Balance Activities: Other (comment) (  LB ADLs)   End  of Session OT - End of Session Activity Tolerance: Patient tolerated treatment well Patient left: in chair;with call bell/phone within reach Nurse Communication: Mobility status  GO     Heylee Tant, [name] 11/05/2013, 12:02 PM

## 2013-11-05 NOTE — Progress Notes (Deleted)
Called cards fellow on call to report critical potassium level of 2.8, and coox level of 61.7. Left message. Awaiting new orders for K+ replacement.

## 2013-11-06 ENCOUNTER — Inpatient Hospital Stay (HOSPITAL_COMMUNITY): Payer: Medicare Other

## 2013-11-06 LAB — BASIC METABOLIC PANEL
BUN: 44 mg/dL — ABNORMAL HIGH (ref 6–23)
CO2: 41 mEq/L (ref 19–32)
CO2: 38 mEq/L — ABNORMAL HIGH (ref 19–32)
Calcium: 8.3 mg/dL — ABNORMAL LOW (ref 8.4–10.5)
Chloride: 91 mEq/L — ABNORMAL LOW (ref 96–112)
Creatinine, Ser: 1.34 mg/dL (ref 0.50–1.35)
GFR calc non Af Amer: 49 mL/min — ABNORMAL LOW (ref >90)
Glucose, Bld: 85 mg/dL (ref 70–99)
Glucose, Bld: 87 mg/dL (ref 70–99)
Potassium: 3.6 mEq/L (ref 3.5–5.1)
Potassium: 3.6 mEq/L (ref 3.5–5.1)
Sodium: 134 mEq/L — ABNORMAL LOW (ref 135–145)
Sodium: 136 mEq/L (ref 135–145)

## 2013-11-06 LAB — CBC
HCT: 29.2 % — ABNORMAL LOW (ref 39.0–52.0)
Hemoglobin: 9.3 g/dL — ABNORMAL LOW (ref 13.0–17.0)
MCH: 29.1 pg (ref 26.0–34.0)
MCV: 91.3 fL (ref 78.0–100.0)
RBC: 3.2 MIL/uL — ABNORMAL LOW (ref 4.22–5.81)

## 2013-11-06 LAB — CARBOXYHEMOGLOBIN
Methemoglobin: 1.4 % (ref 0.0–1.5)
O2 Saturation: 70 %
Total hemoglobin: 8.6 g/dL — ABNORMAL LOW (ref 13.5–18.0)

## 2013-11-06 LAB — GLUCOSE, CAPILLARY
Glucose-Capillary: 94 mg/dL (ref 70–99)
Glucose-Capillary: 92 mg/dL (ref 70–99)

## 2013-11-06 MED ORDER — ENALAPRIL MALEATE 2.5 MG PO TABS
2.5000 mg | ORAL_TABLET | Freq: Two times a day (BID) | ORAL | Status: DC
  Administered 2013-11-06 – 2013-11-08 (×5): 2.5 mg via ORAL
  Filled 2013-11-06 (×6): qty 1

## 2013-11-06 MED ORDER — POTASSIUM CHLORIDE CRYS ER 20 MEQ PO TBCR
20.0000 meq | EXTENDED_RELEASE_TABLET | Freq: Two times a day (BID) | ORAL | Status: DC
  Administered 2013-11-06 – 2013-11-08 (×4): 20 meq via ORAL
  Filled 2013-11-06 (×5): qty 1

## 2013-11-06 MED ORDER — TORSEMIDE 20 MG PO TABS
20.0000 mg | ORAL_TABLET | Freq: Two times a day (BID) | ORAL | Status: DC
  Administered 2013-11-06 – 2013-11-08 (×5): 20 mg via ORAL
  Filled 2013-11-06 (×7): qty 1

## 2013-11-06 NOTE — Progress Notes (Addendum)
Patient ID: Antonio Bernard, male   DOB: 02-17-1933, 79 y.o.   MRN: [id number] Advanced Heart Failure Rounding Note   Subjective:     [name] is a 79 yo male with a hx of HTN, severe anxiety, and OSA who presented to the ED 10/05/13 with a STEMI and was taken to the cath lab. Cath revealed proximally occluded LAD - after flow was restored it was apparently there was extensive LAD disease as well as severe 90-95% ostial D1 disease. Balloon angioplasty was performed, but no stent was placed. TIMI 2 flow was noted in the LAD and an IABP was placed. He was then evaluated by TCTS and was taken for a CABG x2 LIMA-LAD; SVG-DIAG on 10/08/13. After CABG he was brought back to the ICU still on IABP and multiple pressors. The balloon pump was weaned off 10/10/13 and he was extubated 10/13/13. Patient had problems with marked volume overload and cardiorenal syndrome required lasix gtt, milrinone and dopamine to diurese. Transferred to CIR on 10/25/13.  Transferred back to [facility] from CIR on 11/27 due to recurrent low output HF (co-ox 46%) with volume overload and respiratory distress.   11/11/4 Venous doppler acute deep DVT involving R peroneal vein 10/24/13 ECHO EF 25% RV ok.   Lasix and milrinone off due to low CVP. Weight up 2 pounds overnight. Feels fine. Strength and stamina improving per PT/OT   Denies SOB/Orthopnea. Renal function stable. Complains of tannish cough.   CO-OX 67>75>33>55>61%>70% K 2.8>3.6  CVP ~2   Objective:   Weight Range:  Vital Signs:   Temp:  [97.9 F (36.6 C)-98.7 F (37.1 C)] 98.1 F (36.7 C) (12/03 0354) Pulse Rate:  [75-77] 77 (12/03 0354) Resp:  [13-21] 19 (12/03 0354) BP: (110-121)/(48-58) 111/48 mmHg (12/03 0354) SpO2:  [97 %-100 %] 98 % (12/03 0354) Weight:  [106.7 kg (235 lb 3.7 oz)] 106.7 kg (235 lb 3.7 oz) (12/03 0456) Last BM Date: 10/31/13  Weight change: Filed Weights   11/04/13 2325 11/05/13 0423 11/06/13 0456  Weight: 105.9 kg (233 lb 7.5 oz)  105.9 kg (233 lb 7.5 oz) 106.7 kg (235 lb 3.7 oz)    Intake/Output:   Intake/Output Summary (Last 24 hours) at 11/06/13 0636 Last data filed at 11/05/13 1800  Gross per 24 hour  Intake   1010 ml  Output      0 ml  Net   1010 ml     Physical Exam: CVP ~7 General:  Lying in bed NAD.  HEENT: normal Neck: supple. JVP flat Carotids 2+ bilat; no bruits. No lymphadenopathy or thryomegaly appreciated. Cor: PMI laterally displaced. Regular rate & rhythm. +s3 Sternal incision approximated.  Lungs: rhonchorous Abdomen: soft, nontender, non distended. No hepatosplenomegaly. No bruits or masses. Good bowel sounds. Extremities: no cyanosis, clubbing, rash, RLE trace edema. No edema LLE. BLE  Ted hose.  RUE triple lumen picc Neuro: alert & orientedx3, cranial nerves grossly intact. moves all 4 extremities w/o difficulty. Affect pleasant   Labs: Basic Metabolic Panel:  Recent Labs Lab 11/03/13 0500 11/03/13 1548 11/04/13 0447 11/05/13 0455 11/06/13 0423  NA 133* 133* 136 134* 136  K 2.7* 2.8* 2.8* 3.6 3.6  CL 84* 83* 87* 88* 91*  CO2 44* 41* 44* 41* 38*  GLUCOSE 176* 103* 96 85 87  BUN 55* 54* 52* 44* 37*  CREATININE 1.69* 1.77* 1.65* 1.34 1.23  CALCIUM 8.1* 8.6 8.4 8.3* 8.3*  MG  --   --   --  1.9  --  Liver Function Tests: No results found for this basename: AST, ALT, ALKPHOS, BILITOT, PROT, ALBUMIN,  in the last 168 hours No results found for this basename: LIPASE, AMYLASE,  in the last 168 hours No results found for this basename: AMMONIA,  in the last 168 hours  CBC:  Recent Labs Lab 11/03/13 0500 11/04/13 0447  WBC 6.0 6.5  HGB 9.0* 9.4*  HCT 26.8* 28.2*  MCV 89.6 89.8  PLT 403* 399    Cardiac Enzymes: No results found for this basename: CKTOTAL, CKMB, CKMBINDEX, TROPONINI,  in the last 168 hours  BNP: BNP (last 3 results)  Recent Labs  10/07/13 0400 10/17/13 0415  PROBNP 3092.0* 30734.0*    Imaging: No results found.   Medications:      Scheduled Medications: . ALPRAZolam  0.25 mg Oral TID AC  . ALPRAZolam  1 mg Oral QHS  . aspirin EC  81 mg Oral Daily  . atorvastatin  80 mg Oral q1800  . cholecalciferol  2,000 Units Oral Daily  . digoxin  0.125 mg Oral Daily  . feeding supplement (GLUCERNA SHAKE)  237 mL Oral TID BM  . insulin aspart  0-5 Units Subcutaneous QHS  . insulin aspart  0-9 Units Subcutaneous TID WC  . multivitamin with minerals  1 tablet Oral Daily  . potassium chloride  40 mEq Oral BID  . Rivaroxaban  15 mg Oral BID WC  . sodium chloride  10-40 mL Intracatheter Q12H  . sodium chloride  3 mL Intravenous Q12H  . spironolactone  12.5 mg Oral Daily  . vitamin C  1,000 mg Oral Daily    Infusions:    PRN Medications: sodium chloride, acetaminophen, ALPRAZolam, docusate sodium, nitroGLYCERIN, ondansetron (ZOFRAN) IV, oxyCODONE, sodium chloride, sodium chloride, traMADol   Assessment:   1) Cardiogenic shock 2) A/c systolic HF EF 25% 3) Acute respiratory failure 4) CAD   S/p CABG x 2 10/08/13 5) Anterior STEMI 6) Severe Anxiety 7) RLE DVT   -- switched from coumadin to Xarelto 8) Hypokalemia/hyponatremia 9) CKD  Plan/Discussion:    Continues to do well off milrinone. Will restart diuretics. Use torsemide instead of lasix. Continue spironolactone 12.5 mg daily and digoxin 0.25 mg daily. Start low dose enalapril. No b-blocker yet due to recent low output.  Continue Xarelto 15 bid for DVT.  Check CBC and CXR for cough.  Encourage IS.    Keep in SDU so we can follow co-ox and CVP to optimize for another 24 hours or so. Continue PT/OT. Hopefully home by end of week.   [name] 6:36 AM

## 2013-11-06 NOTE — Progress Notes (Signed)
Physical Therapy Treatment Patient Details Name: Antonio Bernard MRN: [id number] DOB: 1933-03-25 Today's Date: 11/06/2013 Time: 8295-6213 PT Time Calculation (min): 23 min  PT Assessment / Plan / Recommendation  History of Present Illness Adm 11/01 with MI; 11/05 CABG x 2 and remained on vent until 11/09, history of orthostatic hypotension with repeated syncopal events. D/C to CIR 11/21 and returned to acute 11/27 with CHF and weakness   PT Comments   Patient continues to make progress with mobility and gait.  Follow Up Recommendations  Home health PT;Supervision/Assistance - 24 hour     Does the patient have the potential to tolerate intense rehabilitation     Barriers to Discharge        Equipment Recommendations  None recommended by PT    Recommendations for Other Services OT consult  Frequency Min 3X/week   Progress towards PT Goals Progress towards PT goals: Progressing toward goals  Plan Current plan remains appropriate    Precautions / Restrictions Precautions Precautions: Sternal;Fall Precaution Comments: history of syncope with PT. Restrictions Weight Bearing Restrictions: No   Pertinent Vitals/Pain     Mobility  Bed Mobility Bed Mobility: Not assessed (Patient in recliner) Transfers Transfers: Sit to Stand;Stand to Sit Sit to Stand: 4: Min guard;From chair/3-in-1;From toilet Stand to Sit: 4: Min guard;To toilet;To chair/3-in-1 Details for Transfer Assistance: Pt consistently able to move sit to stand without UE support with min guard assist Ambulation/Gait Ambulation/Gait Assistance: 4: Min guard Ambulation Distance (Feet): 220 Feet Assistive device: Rolling walker Ambulation/Gait Assistance Details: Verbal cues to stand upright and look forward during gait.  Patient required 1 standing rest break.  Encouraged pursed-lip breathing during gait to minimize dyspnea Gait Pattern: Step-through pattern;Decreased stride length;Trunk flexed Gait velocity:  decreased      PT Goals (current goals can now be found in the care plan section)    Visit Information  Last PT Received On: 11/06/13 Assistance Needed: +1 History of Present Illness: Adm 11/01 with MI; 11/05 CABG x 2 and remained on vent until 11/09, history of orthostatic hypotension with repeated syncopal events. D/C to CIR 11/21 and returned to acute 11/27 with CHF and weakness    Subjective Data  Subjective: "I get stiff sitting here"   Cognition  Cognition Arousal/Alertness: Awake/alert Behavior During Therapy: WFL for tasks assessed/performed Overall Cognitive Status: Within Functional Limits for tasks assessed    Balance     End of Session PT - End of Session Equipment Utilized During Treatment: Gait belt Activity Tolerance: Patient limited by fatigue Patient left: in chair;with call bell/phone within reach;with family/visitor present Nurse Communication: Mobility status   GP     Vena Austria 11/06/2013, 11:45 AM Durenda Hurt. Renaldo Fiddler, [facility] Acute Rehab Services Pager [phone]

## 2013-11-06 NOTE — Evaluation (Signed)
Occupational Therapy Evaluation Patient Details Name: Antonio Bernard MRN: [id number] DOB: 1933-03-10 Today's Date: 11/06/2013 Time: 4098-1191 OT Time Calculation (min): 31 min  OT Assessment / Plan / Recommendation History of present illness Adm 11/01 with MI; 11/05 CABG x 2 and remained on vent until 11/09, history of orthostatic hypotension with repeated syncopal events. D/C to CIR 11/21 and returned to acute 11/27 with CHF and weakness   Clinical Impression   Pt continues to progress in endurance, mobility, and self care. Had been up in chair all day with multiple trips to bathroom and longer walk with PT today.  Pt was ready to return to bed after session.    OT Assessment       Follow Up Recommendations  Home health OT;Supervision/Assistance - 24 hour    Barriers to Discharge      Equipment Recommendations       Recommendations for Other Services    Frequency  Min 2X/week    Precautions / Restrictions Precautions Precautions: Sternal;Fall Precaution Comments: history of syncope with PT. Restrictions Weight Bearing Restrictions: No   Pertinent Vitals/Pain VSS on RA, no pain    ADL  Grooming: Wash/dry hands;Wash/dry face;Min guard;Shaving Where Assessed - Grooming: Unsupported standing Upper Body Dressing: Independent Where Assessed - Upper Body Dressing: Unsupported sitting Toilet Transfer: Min Pension scheme manager Method: Sit to stand;Stand pivot Acupuncturist: Comfort height toilet Toileting - Clothing Manipulation and Hygiene: Maximal assistance (for thoroughness with pericare after BM) Where Assessed - Glass blower/designer Manipulation and Hygiene: Standing Tub/Shower Transfer: Insurance risk surveyor Method: Ambulating Equipment Used: Rolling walker;Gait belt Transfers/Ambulation Related to ADLs: min guard with no device to bathroom and back ADL Comments: Pt's church is making his bathroom at home accessible with walk in shower and  built in seat and handicap height toilet while he is here.    OT Diagnosis:    OT Problem List:   OT Treatment Interventions:     OT Goals(Current goals can be found in the care plan section) Acute Rehab OT Goals Patient Stated Goal: return to the [facility]  Visit Information  Last OT Received On: 11/06/13 Assistance Needed: +1 History of Present Illness: Adm 11/01 with MI; 11/05 CABG x 2 and remained on vent until 11/09, history of orthostatic hypotension with repeated syncopal events. D/C to CIR 11/21 and returned to acute 11/27 with CHF and weakness       Prior Functioning               Vision/Perception     Cognition  Cognition Arousal/Alertness: Awake/alert Behavior During Therapy: WFL for tasks assessed/performed Overall Cognitive Status: Within Functional Limits for tasks assessed    Extremity/Trunk Assessment       Mobility Bed Mobility Bed Mobility: Sit to Supine Supine to Sit: 6: Modified independent (Device/Increase time) Transfers Transfers: Sit to Stand;Stand to Sit Sit to Stand: 4: Min guard;From chair/3-in-1;From toilet Stand to Sit: 4: Min guard;To toilet;To chair/3-in-1 Details for Transfer Assistance: Pt consistently able to move sit to stand without UE support with min guard assist     Exercise     Balance     End of Session OT - End of Session Activity Tolerance: Patient tolerated treatment well Patient left: in bed;with call bell/phone within reach;with family/visitor present  GO     Evern Bio 11/06/2013, 2:47 PM [phone]

## 2013-11-07 LAB — BASIC METABOLIC PANEL
BUN: 36 mg/dL — ABNORMAL HIGH (ref 6–23)
CO2: 38 mEq/L — ABNORMAL HIGH (ref 19–32)
GFR calc Af Amer: 56 mL/min — ABNORMAL LOW (ref >90)
GFR calc non Af Amer: 48 mL/min — ABNORMAL LOW (ref >90)
Potassium: 3.5 mEq/L (ref 3.5–5.1)
Sodium: 132 mEq/L — ABNORMAL LOW (ref 135–145)

## 2013-11-07 LAB — GLUCOSE, CAPILLARY
Glucose-Capillary: 102 mg/dL — ABNORMAL HIGH (ref 70–99)
Glucose-Capillary: 82 mg/dL (ref 70–99)

## 2013-11-07 LAB — CARBOXYHEMOGLOBIN
Carboxyhemoglobin: 2.3 % — ABNORMAL HIGH (ref 0.5–1.5)
Methemoglobin: 0.6 % (ref 0.0–1.5)
O2 Saturation: 63.3 %

## 2013-11-07 MED ORDER — SPIRONOLACTONE 25 MG PO TABS
25.0000 mg | ORAL_TABLET | Freq: Every day | ORAL | Status: DC
  Administered 2013-11-07 – 2013-11-08 (×2): 25 mg via ORAL
  Filled 2013-11-07 (×2): qty 1

## 2013-11-07 MED ORDER — POTASSIUM CHLORIDE CRYS ER 20 MEQ PO TBCR
40.0000 meq | EXTENDED_RELEASE_TABLET | Freq: Once | ORAL | Status: AC
  Administered 2013-11-07: 40 meq via ORAL

## 2013-11-07 MED ORDER — GUAIFENESIN-DM 100-10 MG/5ML PO SYRP
5.0000 mL | ORAL_SOLUTION | ORAL | Status: DC | PRN
  Administered 2013-11-07 – 2013-11-08 (×2): 5 mL via ORAL
  Filled 2013-11-07 (×2): qty 5

## 2013-11-07 NOTE — Progress Notes (Signed)
Patient ID: Antonio Bernard, male   DOB: 1933/03/29, 79 y.o.   MRN: [id number] Advanced Heart Failure Rounding Note   Subjective:     Antonio Bernard is a 79 yo male with a hx of HTN, severe anxiety, and OSA who presented to the ED 10/05/13 with a STEMI and was taken to the cath lab. Cath revealed proximally occluded LAD - after flow was restored it was apparently there was extensive LAD disease as well as severe 90-95% ostial D1 disease. Balloon angioplasty was performed, but no stent was placed. TIMI 2 flow was noted in the LAD and an IABP was placed. He was then evaluated by TCTS and was taken for a CABG x2 LIMA-LAD; SVG-DIAG on 10/08/13. After CABG he was brought back to the ICU still on IABP and multiple pressors. The balloon pump was weaned off 10/10/13 and he was extubated 10/13/13. Patient had problems with marked volume overload and cardiorenal syndrome required lasix gtt, milrinone and dopamine to diurese. Transferred to CIR on 10/25/13.  Transferred back to [facility] from CIR on 11/27 due to recurrent low output HF (co-ox 46%) with volume overload and respiratory distress.   11/11/4 Venous doppler acute deep DVT involving R peroneal vein 10/24/13 ECHO EF 25% RV ok.   Lasix and milrinone off due to low CVP. Yesterday torsemide 20 mg bid low dose enalapril added.  Weight down 1 pound. Able to ambulate around the unit.    Denies SOB/Orthopnea.   CO-OX 63% K 3.5   Hemoglobin 9.3  CVP ~8 CXR- Improving CHF   Objective:   Weight Range:  Vital Signs:   Temp:  [97.4 F (36.3 C)-98.9 F (37.2 C)] 98.6 F (37 C) (12/04 0500) Pulse Rate:  [66-94] 71 (12/04 0500) Resp:  [13-19] 13 (12/04 0500) BP: (105-141)/(42-70) 111/49 mmHg (12/04 0500) SpO2:  [98 %-100 %] 100 % (12/04 0500) Weight:  [234 lb 12.6 oz (106.5 kg)] 234 lb 12.6 oz (106.5 kg) (12/04 0500) Last BM Date: 11/06/13  Weight change: Filed Weights   11/05/13 0423 11/06/13 0456 11/07/13 0500  Weight: 233 lb 7.5 oz (105.9 kg) 235  lb 3.7 oz (106.7 kg) 234 lb 12.6 oz (106.5 kg)    Intake/Output:   Intake/Output Summary (Last 24 hours) at 11/07/13 0726 Last data filed at 11/07/13 0200  Gross per 24 hour  Intake    720 ml  Output    200 ml  Net    520 ml     Physical Exam: CVP ~8 General:  Lying in bed NAD.  HEENT: normal Neck: supple. JVP flat Carotids 2+ bilat; no bruits. No lymphadenopathy or thryomegaly appreciated. Cor: PMI laterally displaced. Regular rate & rhythm. +s3 Sternal incision approximated.  Lungs: rhonchorous Abdomen: soft, nontender, non distended. No hepatosplenomegaly. No bruits or masses. Good bowel sounds. Extremities: no cyanosis, clubbing, rash, RLE trace edema. No edema LLE. BLE  Ted hose.  RUE triple lumen picc Neuro: alert & orientedx3, cranial nerves grossly intact. moves all 4 extremities w/o difficulty. Affect pleasant   Labs: Basic Metabolic Panel:  Recent Labs Lab 11/03/13 1548 11/04/13 0447 11/05/13 0455 11/06/13 0423 11/07/13 0350  NA 133* 136 134* 136 132*  K 2.8* 2.8* 3.6 3.6 3.5  CL 83* 87* 88* 91* 87*  CO2 41* 44* 41* 38* 38*  GLUCOSE 103* 96 85 87 93  BUN 54* 52* 44* 37* 36*  CREATININE 1.77* 1.65* 1.34 1.23 1.36*  CALCIUM 8.6 8.4 8.3* 8.3* 8.1*  MG  --   --  1.9  --   --     Liver Function Tests: No results found for this basename: AST, ALT, ALKPHOS, BILITOT, PROT, ALBUMIN,  in the last 168 hours No results found for this basename: LIPASE, AMYLASE,  in the last 168 hours No results found for this basename: AMMONIA,  in the last 168 hours  CBC:  Recent Labs Lab 11/03/13 0500 11/04/13 0447 11/06/13 0712  WBC 6.0 6.5 7.2  HGB 9.0* 9.4* 9.3*  HCT 26.8* 28.2* 29.2*  MCV 89.6 89.8 91.3  PLT 403* 399 369    Cardiac Enzymes: No results found for this basename: CKTOTAL, CKMB, CKMBINDEX, TROPONINI,  in the last 168 hours  BNP: BNP (last 3 results)  Recent Labs  10/07/13 0400 10/17/13 0415  PROBNP 3092.0* 30734.0*    Imaging: Dg Chest 2  View  11/06/2013   CLINICAL DATA:  Cough.  EXAM: CHEST  2 VIEW  COMPARISON:  October 30, 2013.  FINDINGS: The lungs are adequately inflated. Small bilateral pleural effusions are present layering laterally and posteriorly. The interstitial markings are minimally prominent but have improved since the previous study. The cardiopericardial silhouette is top-normal in size. Coarse lung markings in the retrocardiac region likely reflect subsegmental atelectasis. The patient has undergone previous CABG. The mediastinum is normal in width. There is no pleural effusion.  IMPRESSION: The findings suggest improving CHF with small bilateral pleural effusions.   Electronically Signed   By: David  [place]   On: 11/06/2013 08:25     Medications:     Scheduled Medications: . ALPRAZolam  0.25 mg Oral TID AC  . ALPRAZolam  1 mg Oral QHS  . aspirin EC  81 mg Oral Daily  . atorvastatin  80 mg Oral q1800  . cholecalciferol  2,000 Units Oral Daily  . digoxin  0.125 mg Oral Daily  . enalapril  2.5 mg Oral BID  . feeding supplement (GLUCERNA SHAKE)  237 mL Oral TID BM  . insulin aspart  0-5 Units Subcutaneous QHS  . insulin aspart  0-9 Units Subcutaneous TID WC  . multivitamin with minerals  1 tablet Oral Daily  . potassium chloride  20 mEq Oral BID  . Rivaroxaban  15 mg Oral BID WC  . sodium chloride  10-40 mL Intracatheter Q12H  . sodium chloride  3 mL Intravenous Q12H  . spironolactone  12.5 mg Oral Daily  . torsemide  20 mg Oral BID  . vitamin C  1,000 mg Oral Daily    Infusions:    PRN Medications: sodium chloride, acetaminophen, ALPRAZolam, docusate sodium, nitroGLYCERIN, ondansetron (ZOFRAN) IV, oxyCODONE, sodium chloride, sodium chloride, traMADol   Assessment:   1) Cardiogenic shock 2) A/c systolic HF EF 25% 3) Acute respiratory failure 4) CAD   S/p CABG x 2 10/08/13 5) Anterior STEMI 6) Severe Anxiety 7) RLE DVT   -- switched from coumadin to Xarelto 8) Hypokalemia/hyponatremia 9)  CKD  Plan/Discussion:    Volume status stable. Weight down 1 pound. CVP ~8. Continue torsemide 20 mg bid and increase spiro to 25 mg daily. Continue digoxin 0.25 mg daily and enalapril 2.5 mg bid.  No b-blocker yet due to recent low output. Check Dig level tomorrow. Renal function ok.   Continue Xarelto 15 bid for DVT.  CXR ok. Check CBC and CXR for cough.    Continue PT/OT. HH set up.    CLEGG,AMY,NP-C  7:26 AM  Patient seen and examined with Antonio Becket, NP. We discussed all aspects of the encounter. I  agree with the assessment and plan as stated above.   Continues to get stronger. Numbers look good. Hemodynamically stable.  Transfer to 2W today. Possibly home tomorrow with [facility].   Palak Tercero,MD 1:18 PM

## 2013-11-07 NOTE — Progress Notes (Signed)
1400 report received from 2 H RN

## 2013-11-07 NOTE — Progress Notes (Signed)
ANTICOAGULATION CONSULT NOTE -follow up  Pharmacy Consult:  Xarelto Indication:  DVT  Allergies  Allergen Reactions  . Demerol [Meperidine] Other (See Comments)    "makes me crazy"  . Lisinopril Diarrhea  . Shellfish Allergy Rash    Patient Measurements: Height: 6\' 1"  (185.4 cm) Weight: 234 lb 12.6 oz (106.5 kg) IBW/kg (Calculated) : 79.9  Vital Signs: Temp: 98.2 F (36.8 C) (12/04 1200) Temp src: Oral (12/04 1200) BP: 103/45 mmHg (12/04 1200) Pulse Rate: 84 (12/04 0800)  Labs:  Recent Labs  11/05/13 0455 11/06/13 0423 11/06/13 0712 11/07/13 0350  HGB  --   --  9.3*  --   HCT  --   --  29.2*  --   PLT  --   --  369  --   CREATININE 1.34 1.23  --  1.36*    Estimated Creatinine Clearance: 57.3 ml/min (by C-G formula based on Cr of 1.36).  Assessment:  79 YOM on Xarelto for DVT. Switched from comadin to [employer] on 11/01/13.  No bleeding reported. CBC stable. SCr improved to 1.3, CrCl ~ 60 ml/min. No dose adjustments warranted. Pharmacy will sign off and continue to follow peripherally with Heart Failure Service.  Goal of Therapy:  Treatment and prevention of DVT Monitor platelets by anticoagulation protocol: Yes  Plan:   Continuing Xarelto 15mg  po BID x 21 days then 20mg  daily (20mg  daily will start on 12/19).   Thank you for allowing pharmacy to be a part of this patients care team.  Sheppard Coil PharmD., BCPS Clinical Pharmacist Pager [phone] 11/07/2013 2:13 PM

## 2013-11-07 NOTE — Progress Notes (Signed)
1720sitting at bedside. Eating dinner . Daughter at bedside

## 2013-11-08 LAB — BASIC METABOLIC PANEL
BUN: 38 mg/dL — ABNORMAL HIGH (ref 6–23)
Chloride: 90 mEq/L — ABNORMAL LOW (ref 96–112)
Creatinine, Ser: 1.55 mg/dL — ABNORMAL HIGH (ref 0.50–1.35)
GFR calc Af Amer: 48 mL/min — ABNORMAL LOW (ref >90)
Potassium: 3.6 mEq/L (ref 3.5–5.1)

## 2013-11-08 MED ORDER — ENALAPRIL MALEATE 2.5 MG PO TABS: 2.5000 mg | ORAL_TABLET | Freq: Two times a day (BID) | ORAL | Status: DC

## 2013-11-08 MED ORDER — RIVAROXABAN 20 MG PO TABS: 20.0000 mg | ORAL_TABLET | Freq: Every day | ORAL | Status: DC

## 2013-11-08 MED ORDER — ALPRAZOLAM 1 MG PO TABS: 1.0000 mg | ORAL_TABLET | Freq: Every evening | ORAL | Status: DC | PRN

## 2013-11-08 MED ORDER — ATORVASTATIN CALCIUM 80 MG PO TABS: 80.0000 mg | ORAL_TABLET | Freq: Every day | ORAL | Status: DC

## 2013-11-08 MED ORDER — RIVAROXABAN 15 MG PO TABS: 15.0000 mg | ORAL_TABLET | Freq: Two times a day (BID) | ORAL | Status: DC

## 2013-11-08 MED ORDER — NITROGLYCERIN 0.3 MG SL SUBL: 0.3000 mg | SUBLINGUAL_TABLET | SUBLINGUAL | Status: AC | PRN

## 2013-11-08 MED ORDER — SPIRONOLACTONE 25 MG PO TABS: 25.0000 mg | ORAL_TABLET | Freq: Every day | ORAL | Status: DC

## 2013-11-08 MED ORDER — ALPRAZOLAM 0.25 MG PO TABS: 0.2500 mg | ORAL_TABLET | Freq: Three times a day (TID) | ORAL | Status: DC | PRN

## 2013-11-08 MED ORDER — DIGOXIN 125 MCG PO TABS: 0.1250 mg | ORAL_TABLET | Freq: Every day | ORAL | Status: AC

## 2013-11-08 MED ORDER — TORSEMIDE 20 MG PO TABS: 20.0000 mg | ORAL_TABLET | Freq: Two times a day (BID) | ORAL | Status: DC

## 2013-11-08 MED ORDER — OXYCODONE HCL 5 MG PO TABS: 5.0000 mg | ORAL_TABLET | Freq: Four times a day (QID) | ORAL | Status: DC | PRN

## 2013-11-08 MED ORDER — POTASSIUM CHLORIDE CRYS ER 20 MEQ PO TBCR: 20.0000 meq | EXTENDED_RELEASE_TABLET | Freq: Two times a day (BID) | ORAL | Status: DC

## 2013-11-08 NOTE — Progress Notes (Signed)
PT Cancellation Note  Patient Details Name: DMARCO BALDUS MRN: [id number] DOB: [birth year]   Cancelled Treatment:    Reason Eval/Treat Not Completed: Other (comment) (Pt going home per nursing.)   INGOLD,Lem Peary 11/08/2013, 11:32 AM  [facility] [phone] (pager)

## 2013-11-08 NOTE — Progress Notes (Signed)
NUTRITION FOLLOW UP  Intervention:   - Continue Glucerna Shake po TID, each supplement provides 220 kcal and 10 grams of protein. - Pt and family provided with information and handouts on a 1500 mL fluid restriction. Used teach-back method. - RD will continue to follow for nutrition care plan  Nutrition Dx:   Inadequate oral intake related to CHF as evidenced by reported wt loss from previous dry wt; progressing  Goal:   Pt to meet >/= 90% of their estimated nutrition needs; progressing  Monitor:   Wt, po intake, toleration of supplements, I/O's, fluid restriction knowledge, labs  Assessment:   Patient with PMH of HTN, OSA, obesity, AAA with anxiety; patient developed substernal chest pressure and his daughter called EMS; brought emergently to cardiac cath lab where he was evaluated -- CVTS consulted.   Patient s/p procedures 11/4:  CORONARY ARTERY BYPASS GRAFTING (CABG)  INTRAOPERATIVE TRANSESOPHAGEAL ECHOCARDIOGRAM  [facility] RIGHT THIGH  Pt was educated on a low-sodium diet by RD on 11/17.   Pt reported that his dry weight has dropped from 250 lbs prior to admission, to 232 lbs currently. This reflects an 11% wt loss in about a month. Meal completion has been varied. Suspect some form of malnutrition, but not enough information to fully meet criteria. Pt and family were educated on a 1500 mL fluid restriction. Pt reports that his appetite has been improving. Per MD, pt may be discharged around 1200 today. Pt encouraged to continue nutritional supplementation while at home.   Height: Ht Readings from Last 1 Encounters:  11/07/13 6\' 1"  (1.854 m)    Weight Status:   Wt Readings from Last 1 Encounters:  11/08/13 232 lb 11.2 oz (105.552 kg)    Re-estimated needs:  Kcal: 2300-2600 Protein: 125-135 g/day Fluid: 1500 mL fluid restriction  Skin: Incisions on abdomen, chest, and leg  Diet Order: Carb Control   Intake/Output Summary (Last 24 hours) at 11/08/13 1022 Last data filed  at 11/08/13 0946  Gross per 24 hour  Intake    840 ml  Output    400 ml  Net    440 ml    Last BM: 12/4   Labs:   Recent Labs Lab 11/05/13 0455 11/06/13 0423 11/07/13 0350 11/08/13 0520  NA 134* 136 132* 132*  K 3.6 3.6 3.5 3.6  CL 88* 91* 87* 90*  CO2 41* 38* 38* 37*  BUN 44* 37* 36* 38*  CREATININE 1.34 1.23 1.36* 1.55*  CALCIUM 8.3* 8.3* 8.1* 8.2*  MG 1.9  --   --   --   GLUCOSE 85 87 93 84    CBG (last 3)   Recent Labs  11/06/13 2137 11/07/13 0814 11/07/13 1215  GLUCAP 102* 82 81    Scheduled Meds: . ALPRAZolam  0.25 mg Oral TID AC  . ALPRAZolam  1 mg Oral QHS  . aspirin EC  81 mg Oral Daily  . atorvastatin  80 mg Oral q1800  . cholecalciferol  2,000 Units Oral Daily  . digoxin  0.125 mg Oral Daily  . enalapril  2.5 mg Oral BID  . feeding supplement (GLUCERNA SHAKE)  237 mL Oral TID BM  . multivitamin with minerals  1 tablet Oral Daily  . potassium chloride  20 mEq Oral BID  . Rivaroxaban  15 mg Oral BID WC  . [START ON 11/22/2013] rivaroxaban  20 mg Oral Q supper  . sodium chloride  10-40 mL Intracatheter Q12H  . sodium chloride  3 mL Intravenous  Q12H  . spironolactone  25 mg Oral Daily  . torsemide  20 mg Oral BID  . vitamin C  1,000 mg Oral Daily    Continuous Infusions:   Ebbie Latus RD, LDN

## 2013-11-08 NOTE — Discharge Summary (Signed)
Advanced Heart Failure Team  Discharge Summary   Patient ID: Antonio Bernard MRN: [id number], DOB/AGE: [birth year] 79 y.o. Admit date: 10/31/2013 D/C date:     11/08/2013   Primary Discharge Diagnoses:  1) Cardiogenic shock  2) A/c systolic HF  10/2013 EF 25%  3) Acute respiratory failure - 4) CAD with recent anterior STEMI      -S/p CABG x 2 10/08/13 LIMA-LAD; SVG-DIAG on 10/08/13 5) Obesity 6) Severe Anxiety - on chronic Xanax 3-4 times a day at home   7) RLE DVT  -- switched from coumadin to Xarelto . Xarelto 15 mg bid until 12/19 then will switch to 20 mg daily  8) Hypokalemia/hyponatremia  9) CKD creatinine baseline 1.30-11.5   Hospital Course:  Antonio Bernard is a 79 yo male with a hx of HTN, severe anxiety, OSA , ICM EF 25% 10/15/2013, S/P CABG x 2 LIMA-LAD; SVG-DIAG on 10/08/13. His postoperative course was complicated by cardiorenal syndrome  which required lasix drip, milrinone and dopamine. His post operative course was also complicated by DVT in his R peroneal vein. As he improved all drips were weaned off and he was transferred to CIR on 10/24/13.   While on rehab he struggled with volume over load despite increasing his diuretic regimen. On 10/31/13 he developed acute respiratory distress and  volume overload. CXR was consistent with pulmonary edema.  He transferred to back to stepdown for CVPs and CO-OX. Initial CVP was 21 and CO-OX 46%. He was placed Milrinone and lasix drip. As his volume status improved and his CO-OX Milrinone and Lasix drip were stopped  with stable CO-OX. Overall he diuresed 13 pounds. He was later transitioned to torsemide 20 mg bid and 25 mg spironolactone daily. Prior to discharge he was placed on low dose enalapril and digoxin 0.125 mg daily. Prior to discharged he was able to ambulate 220 feet with assistance.    He will continue on Xarelto 15 mg bid until December 19 for DVT and then switch to 20 mg daily. Lifevest was placed prior to discharge and  he will wear 24 hours a day until repeat ECHO is performed in 3 months. [facility] will follow for RN, PT, OT, and an aide to assist with deconditioning and heart failure management.   He received education from dietitian for low sodium food choices. He also had extensive education on medication compliance, daily weights, and limiting fluid intake to < 2 liters per day. He will continue to be followed closely in the HF clinic with follow on December 12 at 10:20. Will repeat BMET at that time.      Discharge Weight Range: Discharge weight 232 pounds Discharge Vitals: Blood pressure 99/56, pulse 71, temperature 97.9 F (36.6 C), temperature source Oral, resp. rate 18, height 6\' 1"  (1.854 m), weight 232 lb 11.2 oz (105.552 kg), SpO2 95.00%.  Labs: Lab Results  Component Value Date   WBC 7.2 11/06/2013   HGB 9.3* 11/06/2013   HCT 29.2* 11/06/2013   MCV 91.3 11/06/2013   PLT 369 11/06/2013    Recent Labs Lab 11/08/13 0520  NA 132*  K 3.6  CL 90*  CO2 37*  BUN 38*  CREATININE 1.55*  CALCIUM 8.2*  GLUCOSE 84   No results found for this basename: CHOL, HDL, LDLCALC, TRIG   BNP (last 3 results)  Recent Labs  10/07/13 0400 10/17/13 0415  PROBNP 3092.0* 30734.0*    Diagnostic Studies/Procedures   No results found.  Discharge Medications  Medication List    STOP taking these medications       carvedilol 3.125 MG tablet  Commonly known as:  COREG     furosemide 80 MG tablet  Commonly known as:  LASIX     traMADol 50 MG tablet  Commonly known as:  ULTRAM     warfarin 7.5 MG tablet  Commonly known as:  COUMADIN      TAKE these medications       ALPRAZolam 0.25 MG tablet  Commonly known as:  XANAX  Take 1 tablet (0.25 mg total) by mouth 3 (three) times daily as needed for anxiety.     ALPRAZolam 1 MG tablet  Commonly known as:  XANAX  Take 1 tablet (1 mg total) by mouth at bedtime as needed for anxiety.     aspirin EC 81 MG tablet  Take 81 mg by mouth daily.      atorvastatin 80 MG tablet  Commonly known as:  LIPITOR  Take 1 tablet (80 mg total) by mouth daily at 6 PM.     cholecalciferol 1000 UNITS tablet  Commonly known as:  VITAMIN D  Take 2,000 Units by mouth daily.     digoxin 0.125 MG tablet  Commonly known as:  LANOXIN  Take 1 tablet (0.125 mg total) by mouth daily.     DSS 100 MG Caps  Take 200 mg by mouth 2 (two) times daily as needed for mild constipation.     enalapril 2.5 MG tablet  Commonly known as:  VASOTEC  Take 1 tablet (2.5 mg total) by mouth 2 (two) times daily.     feeding supplement (GLUCERNA SHAKE) Liqd  Take 237 mLs by mouth 3 (three) times daily between meals.     multivitamin with minerals Tabs tablet  Take 1 tablet by mouth daily.     nitroGLYCERIN 0.3 MG SL tablet  Commonly known as:  NITROSTAT  Place 1 tablet (0.3 mg total) under the tongue every 5 (five) minutes as needed for chest pain.     oxyCODONE 5 MG immediate release tablet  Commonly known as:  Oxy IR/ROXICODONE  Take 1 tablet (5 mg total) by mouth every 6 (six) hours as needed for moderate pain.     potassium chloride SA 20 MEQ tablet  Commonly known as:  K-DUR,KLOR-CON  Take 1 tablet (20 mEq total) by mouth 2 (two) times daily.     Rivaroxaban 15 MG Tabs tablet  Commonly known as:  XARELTO  Take 1 tablet (15 mg total) by mouth 2 (two) times daily with a meal.     Rivaroxaban 20 MG Tabs tablet  Commonly known as:  XARELTO  Take 1 tablet (20 mg total) by mouth daily with supper.  Start taking on:  11/22/2013     spironolactone 25 MG tablet  Commonly known as:  ALDACTONE  Take 1 tablet (25 mg total) by mouth daily.     torsemide 20 MG tablet  Commonly known as:  DEMADEX  Take 1 tablet (20 mg total) by mouth 2 (two) times daily.     vitamin C 1000 MG tablet  Take 1,000 mg by mouth daily.        Disposition   The patient will be discharged in stable condition to home.     Discharge Orders   Future Appointments Provider  Department Dept Phone   11/15/2013 10:20 AM [facility] [phone]   12/04/2013 10:00 AM Theron Arista  Donata Clay, MD [facility] [phone]   Future Orders Complete By Expires   ACE Inhibitor / ARB already ordered  As directed    Diet - low sodium heart healthy  As directed    Heart Failure patients record your daily weight using the same scale at the same time of day  As directed    Increase activity slowly  As directed      Follow-up Information   Follow up with Arvilla Meres, MD On 11/15/2013. (at 10:20 Garage Code 2000)    Specialty:  Cardiology   Contact information:   [address] [place] [address] [phone]         Duration of Discharge Encounter: Greater than 35 minutes   Signed, CLEGG,AMY NP-C 11/08/2013, 10:29 AM  Patient seen and examined with Tonye Becket, NP. We discussed all aspects of the encounter. I agree with the assessment and plan as stated above. He is doing much better. Ready for d/c home today with close f/u in HF Clinic.   Joaovictor Krone,MD 12:28 PM

## 2013-11-08 NOTE — Progress Notes (Signed)
Patient ID: [name], male   DOB: Jan 06, 1933, 79 y.o.   MRN: [id number] Advanced Heart Failure Rounding Note   Subjective:     Mr. Deere is a 79 yo male with a hx of HTN, severe anxiety, and OSA who presented to the ED 10/05/13 with a STEMI and was taken to the cath lab. Cath revealed proximally occluded LAD - after flow was restored it was apparently there was extensive LAD disease as well as severe 90-95% ostial D1 disease. Balloon angioplasty was performed, but no stent was placed. TIMI 2 flow was noted in the LAD and an IABP was placed. He was then evaluated by TCTS and was taken for a CABG x2 LIMA-LAD; SVG-DIAG on 10/08/13. After CABG he was brought back to the ICU still on IABP and multiple pressors. The balloon pump was weaned off 10/10/13 and he was extubated 10/13/13. Patient had problems with marked volume overload and cardiorenal syndrome required lasix gtt, milrinone and dopamine to diurese. Transferred to CIR on 10/25/13.  Transferred back to [facility] from CIR on 11/27 due to recurrent low output HF (co-ox 46%) with volume overload and respiratory distress.   11/11/4 Venous doppler acute deep DVT involving R peroneal vein 10/24/13 ECHO EF 25% RV ok.   Lasix and milrinone off due to low CVP. Yesterday spiro increased to 25 mg daily and he was transferred to telemetry.  Weight down  4 pounds.    Wants to go home. Denies SOB/Orthopnea.    K 3.6   Dig level 0.5  Creatinine 1.3>1.5 CXR- Improving CHF   Objective:   Weight Range:  Vital Signs:   Temp:  [97.7 F (36.5 C)-98.6 F (37 C)] 97.9 F (36.6 C) (12/05 0616) Pulse Rate:  [71-79] 71 (12/05 0616) Resp:  [18-20] 18 (12/05 0616) BP: (93-108)/(39-56) 99/56 mmHg (12/05 0616) SpO2:  [94 %-98 %] 95 % (12/05 0616) Weight:  [232 lb 11.2 oz (105.552 kg)-236 lb 8.9 oz (107.3 kg)] 232 lb 11.2 oz (105.552 kg) (12/05 0616) Last BM Date: 11/07/13  Weight change: Filed Weights   11/07/13 0500 11/07/13 1430 11/08/13 0616   Weight: 234 lb 12.6 oz (106.5 kg) 236 lb 8.9 oz (107.3 kg) 232 lb 11.2 oz (105.552 kg)    Intake/Output:   Intake/Output Summary (Last 24 hours) at 11/08/13 0843 Last data filed at 11/08/13 0245  Gross per 24 hour  Intake    600 ml  Output    400 ml  Net    200 ml     Physical Exam:  General:  Lying in bed NAD.  HEENT: normal Neck: supple. JVP flat Carotids 2+ bilat; no bruits. No lymphadenopathy or thryomegaly appreciated. Cor: PMI laterally displaced. Regular rate & rhythm. +s3 Sternal incision approximated.  Lungs: rhonchorous Abdomen: soft, nontender, non distended. No hepatosplenomegaly. No bruits or masses. Good bowel sounds. Extremities: no cyanosis, clubbing, rash, RLE trace edema. No edema LLE. BLE  Ted hose.  RUE triple lumen picc Neuro: alert & orientedx3, cranial nerves grossly intact. moves all 4 extremities w/o difficulty. Affect pleasant   Labs: Basic Metabolic Panel:  Recent Labs Lab 11/04/13 0447 11/05/13 0455 11/06/13 0423 11/07/13 0350 11/08/13 0520  NA 136 134* 136 132* 132*  K 2.8* 3.6 3.6 3.5 3.6  CL 87* 88* 91* 87* 90*  CO2 44* 41* 38* 38* 37*  GLUCOSE 96 85 87 93 84  BUN 52* 44* 37* 36* 38*  CREATININE 1.65* 1.34 1.23 1.36* 1.55*  CALCIUM 8.4 8.3* 8.3* 8.1*  8.2*  MG  --  1.9  --   --   --     Liver Function Tests: No results found for this basename: AST, ALT, ALKPHOS, BILITOT, PROT, ALBUMIN,  in the last 168 hours No results found for this basename: LIPASE, AMYLASE,  in the last 168 hours No results found for this basename: AMMONIA,  in the last 168 hours  CBC:  Recent Labs Lab 11/03/13 0500 11/04/13 0447 11/06/13 0712  WBC 6.0 6.5 7.2  HGB 9.0* 9.4* 9.3*  HCT 26.8* 28.2* 29.2*  MCV 89.6 89.8 91.3  PLT 403* 399 369    Cardiac Enzymes: No results found for this basename: CKTOTAL, CKMB, CKMBINDEX, TROPONINI,  in the last 168 hours  BNP: BNP (last 3 results)  Recent Labs  10/07/13 0400 10/17/13 0415  PROBNP 3092.0*  30734.0*    Imaging: No results found.   Medications:     Scheduled Medications: . ALPRAZolam  0.25 mg Oral TID AC  . ALPRAZolam  1 mg Oral QHS  . aspirin EC  81 mg Oral Daily  . atorvastatin  80 mg Oral q1800  . cholecalciferol  2,000 Units Oral Daily  . digoxin  0.125 mg Oral Daily  . enalapril  2.5 mg Oral BID  . feeding supplement (GLUCERNA SHAKE)  237 mL Oral TID BM  . multivitamin with minerals  1 tablet Oral Daily  . potassium chloride  20 mEq Oral BID  . Rivaroxaban  15 mg Oral BID WC  . sodium chloride  10-40 mL Intracatheter Q12H  . sodium chloride  3 mL Intravenous Q12H  . spironolactone  25 mg Oral Daily  . torsemide  20 mg Oral BID  . vitamin C  1,000 mg Oral Daily    Infusions:    PRN Medications: sodium chloride, acetaminophen, ALPRAZolam, docusate sodium, guaiFENesin-dextromethorphan, nitroGLYCERIN, ondansetron (ZOFRAN) IV, oxyCODONE, sodium chloride, sodium chloride, traMADol   Assessment:   1) Cardiogenic shock 2) A/c systolic HF EF 25% 3) Acute respiratory failure 4) CAD   S/p CABG x 2 10/08/13 5) Anterior STEMI 6) Severe Anxiety 7) RLE DVT   -- switched from coumadin to Xarelto 8) Hypokalemia/hyponatremia 9) CKD  Plan/Discussion:    Volume status stable. Weight down 1 pound. Continue torsemide 20 mg bid and increase spiro to 25 mg daily. Continue digoxin 0.25 mg daily and enalapril 2.5 mg bid. Dig level ok 0.5.     Continue Xarelto 15 bid for DVT until 12/19 then change to 20 mg daily.    Ok to d/c with HHRN, HHPT, HHOT. Lifevest to be placed. Follow up in HF clinic next week. Check BMET next week.   CLEGG,AMY,NP-C  8:43 AM  Patient seen and examined with Tonye Becket, NP. We discussed all aspects of the encounter. I agree with the assessment and plan as stated above.   Doing very well. I think he is ready for d/c today. Will send home on current regimen. F/u in HF Clinic next week. HHRN and HHPT arranged. Continue LifeVest.   [name] 12:24 PM

## 2013-11-13 ENCOUNTER — Ambulatory Visit: Payer: Medicare Other | Admitting: Cardiothoracic Surgery

## 2013-11-13 ENCOUNTER — Telehealth: Payer: Self-pay | Admitting: *Deleted

## 2013-11-13 NOTE — Telephone Encounter (Signed)
[facility]axed to [facility] signed professional communication for PT & OT

## 2013-11-14 ENCOUNTER — Emergency Department (HOSPITAL_COMMUNITY): Payer: Medicare Other

## 2013-11-14 ENCOUNTER — Inpatient Hospital Stay (HOSPITAL_COMMUNITY)
Admission: EM | Admit: 2013-11-14 | Discharge: 2013-11-15 | DRG: 392 | Disposition: A | Discharge status: Home / Self Care | Payer: Medicare Other | Attending: Internal Medicine | Admitting: Internal Medicine

## 2013-11-14 ENCOUNTER — Encounter (HOSPITAL_COMMUNITY): Payer: Self-pay | Admitting: Emergency Medicine

## 2013-11-14 ENCOUNTER — Telehealth: Payer: Self-pay | Admitting: Physician Assistant

## 2013-11-14 DIAGNOSIS — I2109 ST elevation (STEMI) myocardial infarction involving other coronary artery of anterior wall: Secondary | ICD-10-CM | POA: Diagnosis present

## 2013-11-14 DIAGNOSIS — R5383 Other fatigue: Secondary | ICD-10-CM

## 2013-11-14 DIAGNOSIS — Z7901 Long term (current) use of anticoagulants: Secondary | ICD-10-CM

## 2013-11-14 DIAGNOSIS — I2510 Atherosclerotic heart disease of native coronary artery without angina pectoris: Secondary | ICD-10-CM | POA: Diagnosis present

## 2013-11-14 DIAGNOSIS — Z87891 Personal history of nicotine dependence: Secondary | ICD-10-CM

## 2013-11-14 DIAGNOSIS — I2589 Other forms of chronic ischemic heart disease: Secondary | ICD-10-CM | POA: Diagnosis present

## 2013-11-14 DIAGNOSIS — R197 Diarrhea, unspecified: Principal | ICD-10-CM | POA: Diagnosis present

## 2013-11-14 DIAGNOSIS — I129 Hypertensive chronic kidney disease with stage 1 through stage 4 chronic kidney disease, or unspecified chronic kidney disease: Secondary | ICD-10-CM | POA: Diagnosis present

## 2013-11-14 DIAGNOSIS — Z951 Presence of aortocoronary bypass graft: Secondary | ICD-10-CM

## 2013-11-14 DIAGNOSIS — I509 Heart failure, unspecified: Secondary | ICD-10-CM | POA: Diagnosis present

## 2013-11-14 DIAGNOSIS — I4891 Unspecified atrial fibrillation: Secondary | ICD-10-CM | POA: Diagnosis present

## 2013-11-14 DIAGNOSIS — R5381 Other malaise: Secondary | ICD-10-CM

## 2013-11-14 DIAGNOSIS — N189 Chronic kidney disease, unspecified: Secondary | ICD-10-CM | POA: Diagnosis present

## 2013-11-14 DIAGNOSIS — I5022 Chronic systolic (congestive) heart failure: Secondary | ICD-10-CM

## 2013-11-14 DIAGNOSIS — E860 Dehydration: Secondary | ICD-10-CM | POA: Diagnosis present

## 2013-11-14 DIAGNOSIS — K219 Gastro-esophageal reflux disease without esophagitis: Secondary | ICD-10-CM | POA: Diagnosis present

## 2013-11-14 DIAGNOSIS — Z7982 Long term (current) use of aspirin: Secondary | ICD-10-CM

## 2013-11-14 DIAGNOSIS — Z79899 Other long term (current) drug therapy: Secondary | ICD-10-CM

## 2013-11-14 DIAGNOSIS — E669 Obesity, unspecified: Secondary | ICD-10-CM | POA: Diagnosis present

## 2013-11-14 DIAGNOSIS — I238 Other current complications following acute myocardial infarction: Secondary | ICD-10-CM | POA: Diagnosis present

## 2013-11-14 DIAGNOSIS — I82509 Chronic embolism and thrombosis of unspecified deep veins of unspecified lower extremity: Secondary | ICD-10-CM | POA: Diagnosis present

## 2013-11-14 LAB — COMPREHENSIVE METABOLIC PANEL
ALT: 46 U/L (ref 0–53)
AST: 31 U/L (ref 0–37)
Albumin: 2.9 g/dL — ABNORMAL LOW (ref 3.5–5.2)
Alkaline Phosphatase: 105 U/L (ref 39–117)
CO2: 30 mEq/L (ref 19–32)
Calcium: 8.8 mg/dL (ref 8.4–10.5)
Chloride: 96 mEq/L (ref 96–112)
GFR calc non Af Amer: 37 mL/min — ABNORMAL LOW (ref >90)
Potassium: 3.7 mEq/L (ref 3.5–5.1)
Total Bilirubin: 0.8 mg/dL (ref 0.3–1.2)

## 2013-11-14 LAB — CBC WITH DIFFERENTIAL/PLATELET
Basophils Absolute: 0 10*3/uL (ref 0.0–0.1)
Basophils Relative: 0 % (ref 0–1)
Eosinophils Absolute: 0.3 10*3/uL (ref 0.0–0.7)
HCT: 29.2 % — ABNORMAL LOW (ref 39.0–52.0)
Hemoglobin: 9.2 g/dL — ABNORMAL LOW (ref 13.0–17.0)
Lymphocytes Relative: 24 % (ref 12–46)
Lymphs Abs: 1.5 10*3/uL (ref 0.7–4.0)
MCH: 28.9 pg (ref 26.0–34.0)
MCHC: 31.5 g/dL (ref 30.0–36.0)
MCV: 91.8 fL (ref 78.0–100.0)
Monocytes Absolute: 0.5 10*3/uL (ref 0.1–1.0)
Neutro Abs: 4.1 10*3/uL (ref 1.7–7.7)
Neutrophils Relative %: 64 % (ref 43–77)
RDW: 15.8 % — ABNORMAL HIGH (ref 11.5–15.5)
WBC: 6.5 10*3/uL (ref 4.0–10.5)

## 2013-11-14 LAB — POCT I-STAT TROPONIN I: Troponin i, poc: 0.03 ng/mL (ref 0.00–0.08)

## 2013-11-14 LAB — DIGOXIN LEVEL: Digoxin Level: 0.6 ng/mL — ABNORMAL LOW (ref 0.8–2.0)

## 2013-11-14 MED ORDER — METRONIDAZOLE IN NACL 5-0.79 MG/ML-% IV SOLN: 500.0000 mg | Freq: Once | INTRAVENOUS | Status: DC

## 2013-11-14 MED ORDER — METRONIDAZOLE 500 MG PO TABS
500.0000 mg | ORAL_TABLET | Freq: Once | ORAL | Status: AC
  Administered 2013-11-14: 500 mg via ORAL
  Filled 2013-11-14: qty 1

## 2013-11-14 NOTE — ED Notes (Addendum)
IV access was unsuccessful by IV team. Dr. Jeraldine Loots and Dr. Julian Reil are both at bedside and explained the risks and benefits to the patient of not having an IV. Family and patient state that they are aware and understand-refusing anymore IV attempts. Dr. Jeraldine Loots has made a note in the chart and Dr. Julian Reil has wrote a nursing communication stating that patient is allowed to go to the floor without an IV. Liborio Nixon, RN made aware on 6 east. Nathaniel Man, charge RN in ED also made aware.

## 2013-11-14 NOTE — ED Notes (Signed)
Patient being transported upstairs by Molly Maduro, EMT.

## 2013-11-14 NOTE — ED Notes (Signed)
IV team at bedside 

## 2013-11-14 NOTE — ED Notes (Signed)
The patient is unable to give C. Difficile at this time. The patient has been advised to use call light for assistance. The tech has reported to the RN in charge.

## 2013-11-14 NOTE — ED Notes (Signed)
Phlebotomy at bedside.

## 2013-11-14 NOTE — ED Notes (Signed)
Transporting patient to new room assignment. 

## 2013-11-14 NOTE — ED Notes (Signed)
Patient presents to ED via [facility]. Patient had a CABG on October 08, 2013. At approx 1 am last night he started having diarrhea and "stomach cramps." Pt states that in the past 24 hours he has had diarrhea x6. Denies any n/v. Per EMS patient is also orthostatic. Family gave pt 0.25 xanax before getting on EMS truck. Pt is currently wearing a life vest at this time. A&Ox4.

## 2013-11-14 NOTE — ED Notes (Signed)
MD at bedside. 

## 2013-11-14 NOTE — Telephone Encounter (Signed)
Dtr called because pt has had copious diarrhea since 1 am. > 10 stools, no blood seen. BP was low this am, he has had presyncope and has been very weak. PO intake has been poor today.  He is on fluid restrictions and she worries he is getting dehydrated.  Advised her he needs to come to the ER by EMS.  He is currently refusing this but she will call them and get him to go.

## 2013-11-14 NOTE — ED Provider Notes (Addendum)
CSN: [id number]     Arrival date & time 11/14/13  2003 History   First MD Initiated Contact with Patient 11/14/13 2004     Chief Complaint  Patient presents with  . Diarrhea   (Consider location/radiation/quality/duration/timing/severity/associated sxs/prior Treatment) HPI  Patient presents with concerns of ongoing diarrhea, fatigue. Patient has had innumerable episodes of loose stool all day. Symptoms began today.  Initially there was associated lower abdominal crampy sensation, but this has stopped entirely prior to my evaluation. There has been generalized fatigue.  There has been no fever, no chills, no syncope, though the patient does endorse lightheadedness. Patient denies confusion, disorientation, dyspnea beyond baseline.   Patient has a notable history of cardiac bypass one month ago.  Prior to that the patient has a long history of heart failure.  Following his bypass surgery he had decompensation requiring milrinone for some time.  He did not require antibiotics throughout that stay, was discharged one week ago, he notes that he is generally improving since that admission.   Patient spoke with his cardiologist and was referred here for further E/M.  Past Medical History  Diagnosis Date  . Acid reflux   . Family history of anesthesia complication     daughter had problem waking up  . Pneumonia     hx of  . Sleep apnea     no treatment for, last sleep study over 5 years ago  . Abdominal aortic aneurysm     found 06/29/12 "small"  . Headache(784.0)   . Herniated vertebral disc     thoracic area  . Cataracts, bilateral     hx of  . H/O Legionnaire's disease     about 24 years ago  . Anxiety     panic attacks   . Claustrophobia     "severe"  . Hypertension     sees  Dr. Ronne Binning [phone]  . Obesity (BMI 35.0-39.9 without comorbidity) 10/06/2013  . Essential hypertension 10/06/2013  . Anginal pain   . Atrial fibrillation     Postoperative  . Acute renal  insufficiency     Postoperative  . Anemia associated with acute blood loss     Postoperative   Past Surgical History  Procedure Laterality Date  . Shoulder surgery      left  . Cataract extraction w/ intraocular lens implant      bilateral  . Lumbar laminectomy/decompression microdiscectomy  07/20/2012    Procedure: LUMBAR LAMINECTOMY/DECOMPRESSION MICRODISCECTOMY 1 LEVEL;  Surgeon: Reinaldo Meeker, MD;  Location: [facility];  Service: Neurosurgery;  Laterality: N/A;  lumbar four-five left  . Back surgery    . Cardiac catheterization  10/05/2013  . Eye surgery    . Coronary artery bypass graft N/A 10/08/2013    Procedure: CORONARY ARTERY BYPASS GRAFTING (CABG);  Surgeon: Kerin Perna, MD;  Location: [facility] OR;  Service: Open Heart Surgery;  Laterality: N/A;  Times 2 using left internal mammary artery and endoscopically harvested left saphenous vein  . Intraoperative transesophageal echocardiogram N/A 10/08/2013    Procedure: INTRAOPERATIVE TRANSESOPHAGEAL ECHOCARDIOGRAM;  Surgeon: Kerin Perna, MD;  Location: [facility] OR;  Service: Open Heart Surgery;  Laterality: N/A;   No family history on file. History  Substance Use Topics  . Smoking status: Former Smoker -- 0.50 packs/day    Types: Cigarettes, Cigars  . Smokeless tobacco: Never Used     Comment: 50 years ago  . Alcohol Use: No    Review of Systems  Constitutional:  Per HPI, otherwise negative  HENT:       Per HPI, otherwise negative  Respiratory:       Per HPI, otherwise negative  Cardiovascular:       Per HPI, otherwise negative  Gastrointestinal: Positive for vomiting and diarrhea.  Endocrine:       Negative aside from HPI  Genitourinary:       Neg aside from HPI   Musculoskeletal:       Per HPI, otherwise negative  Skin: Negative.   Neurological: Positive for weakness and light-headedness. Negative for syncope.    Allergies  Demerol; Lisinopril; and Shellfish allergy  Home Medications   Current  Outpatient Rx  Name  Route  Sig  Dispense  Refill  . ALPRAZolam (XANAX) 0.25 MG tablet   Oral   Take 1 tablet (0.25 mg total) by mouth 3 (three) times daily as needed for anxiety.   90 tablet   0   . ALPRAZolam (XANAX) 1 MG tablet   Oral   Take 1 tablet (1 mg total) by mouth at bedtime as needed for anxiety.   30 tablet   0   . Ascorbic Acid (VITAMIN C) 1000 MG tablet   Oral   Take 1,000 mg by mouth daily.         Marland Kitchen aspirin EC 81 MG tablet   Oral   Take 81 mg by mouth daily.         Marland Kitchen atorvastatin (LIPITOR) 80 MG tablet   Oral   Take 1 tablet (80 mg total) by mouth daily at 6 PM.   30 tablet   6   . cholecalciferol (VITAMIN D) 1000 UNITS tablet   Oral   Take 2,000 Units by mouth daily.         . digoxin (LANOXIN) 0.125 MG tablet   Oral   Take 1 tablet (0.125 mg total) by mouth daily.   30 tablet   6   . docusate sodium 100 MG CAPS   Oral   Take 200 mg by mouth 2 (two) times daily as needed for mild constipation.   10 capsule   0   . enalapril (VASOTEC) 2.5 MG tablet   Oral   Take 1 tablet (2.5 mg total) by mouth 2 (two) times daily.   60 tablet   6   . feeding supplement, GLUCERNA SHAKE, (GLUCERNA SHAKE) LIQD   Oral   Take 237 mLs by mouth 3 (three) times daily between meals as needed (meal replacement).         . Multiple Vitamin (MULTIVITAMIN WITH MINERALS) TABS tablet   Oral   Take 1 tablet by mouth daily.         . nitroGLYCERIN (NITROSTAT) 0.3 MG SL tablet   Sublingual   Place 1 tablet (0.3 mg total) under the tongue every 5 (five) minutes as needed for chest pain.   90 tablet   12   . oxyCODONE (OXY IR/ROXICODONE) 5 MG immediate release tablet   Oral   Take 1 tablet (5 mg total) by mouth every 6 (six) hours as needed for moderate pain.   30 tablet   0   . potassium chloride SA (K-DUR,KLOR-CON) 20 MEQ tablet   Oral   Take 1 tablet (20 mEq total) by mouth 2 (two) times daily.   60 tablet   6   . Rivaroxaban (XARELTO) 15 MG TABS  tablet   Oral   Take 1 tablet (15 mg total) by mouth  2 (two) times daily with a meal.   28 tablet   0   . spironolactone (ALDACTONE) 25 MG tablet   Oral   Take 1 tablet (25 mg total) by mouth daily.   30 tablet   6   . torsemide (DEMADEX) 20 MG tablet   Oral   Take 1 tablet (20 mg total) by mouth 2 (two) times daily.   60 tablet   6   . Rivaroxaban (XARELTO) 20 MG TABS tablet   Oral   Take 1 tablet (20 mg total) by mouth daily with supper.   30 tablet   6    BP 109/55  Pulse 83  Temp(Src) 97.7 F (36.5 C) (Oral)  Resp 18  SpO2 98% Physical Exam  Nursing note and vitals reviewed. Constitutional: He is oriented to person, place, and time. He appears well-developed.  Non-toxic appearance. He has a sickly appearance.    HENT:  Head: Normocephalic and atraumatic.  Eyes: Conjunctivae and EOM are normal.  Cardiovascular: Normal rate, regular rhythm and intact distal pulses.   Pulmonary/Chest: Effort normal. No stridor. No respiratory distress.  Abdominal: He exhibits no distension. There is no tenderness. There is no rebound and no guarding.  Musculoskeletal: He exhibits no edema.  Neurological: He is alert and oriented to person, place, and time.  Skin: Skin is warm and dry. No pallor.  Psychiatric: He has a normal mood and affect.    ED Course  Procedures (including critical care time) Labs Review Labs Reviewed  CBC WITH DIFFERENTIAL - Abnormal; Notable for the following:    RBC 3.18 (*)    Hemoglobin 9.2 (*)    HCT 29.2 (*)    RDW 15.8 (*)    All other components within normal limits  COMPREHENSIVE METABOLIC PANEL - Abnormal; Notable for the following:    BUN 34 (*)    Creatinine, Ser 1.69 (*)    Albumin 2.9 (*)    GFR calc non Af Amer 37 (*)    GFR calc Af Amer 43 (*)    All other components within normal limits  PRO B NATRIURETIC PEPTIDE - Abnormal; Notable for the following:    Pro B Natriuretic peptide (BNP) 18488.0 (*)    All other components within  normal limits  DIGOXIN LEVEL - Abnormal; Notable for the following:    Digoxin Level 0.6 (*)    All other components within normal limits  CLOSTRIDIUM DIFFICILE BY PCR  TROPONIN I  URINALYSIS, ROUTINE W REFLEX MICROSCOPIC  POCT I-STAT TROPONIN I   Imaging Review Dg Abd Acute W/chest  11/14/2013   CLINICAL DATA:  History of MI and CHF.  Hypertension.  Diarrhea.  EXAM: ACUTE ABDOMEN SERIES (ABDOMEN 2 VIEW & CHEST 1 VIEW)  COMPARISON:  11/06/2013  FINDINGS: Normal bowel gas pattern. No free air. Abdominal soft tissues show mild aortic calcifications but are otherwise unremarkable.  Frontal chest radiograph shows changes from prior CABG surgery. The lungs are clear.  IMPRESSION: No acute findings. No obstruction or free air. No acute cardiopulmonary disease on the chest radiograph.   Electronically Signed   By: Amie Portland M.D.   On: 11/14/2013 21:25    EKG Interpretation   None      After return of initial labs I discussed the patient's case with our cardiologist.  Review of labs, history.  Patient will have consult provided by cardiology, but will be admitted to the hospitalist.  After the initial evaluation, with some concern for colitis.  We had treated the patient empirically with Flagyl.  Labs most notable for elevated BNP, and sub therapeutic digoxin level  MDM   1. Diarrhea    Patient presents with concern diarrhea and weakness.  Notably, the patient has advanced heart disease, recently having bypass surgery, and with known ejection fraction of approximately 25%.  With the patient's significant comorbidities, his description of fatigue, resuscitation for his acute episode of diarrhea it is complicated.  With some concern for C. difficile colitis the patient received empiric Flagyl. Patient remained awake and alert throughout the patient's emergency department course, was admitted for further evaluation and management after I discussed his case with his cardiology team and our  hospitalist service.    Gerhard Munch, MD 11/14/13 2315  11:22 PM IV team was unable to secure access.  I discussed this with the patient and his family.  The patient defers the recommendation for additional attempts and states that he understands the risk of not having IV access.  Gerhard Munch, MD 11/14/13 [phone]

## 2013-11-14 NOTE — ED Notes (Signed)
IV start unsuccessful at this time- IV team paged and returned call.

## 2013-11-14 NOTE — Consult Note (Signed)
CARDIOLOGY CONSULT NOTE  Patient ID: Antonio Bernard, MRN: [id number], DOB/AGE: 1933-07-24 79 y.o. Admit date: 11/14/2013 Date of Consult: 11/14/2013  Primary Physician: Thayer Headings, MD Primary Cardiologist: Dr. Gala Romney  Chief Complaint: fatigue and diarrhea Reason for Consultation: complex cardiac history  HPI: 79 y.o. male w/ PMHx significant for HTN, CAD s/p CABG, post op afib  who presented to [facility] on 11/14/2013 with complaints of fatigue and diarrhea.  Noted that he was recently hospitalized for 10 days with decompensated CHF. This after prolonged hospitalization s/p CABG on 11/4. Course complicated by need for dopamine and milrinone and lasix gtt.   Over the past day and half, has felt weak and fatigued. Also reports frequent diarrhea x 1 day. Home health nursing and daughter concerned for dehydration given his diuretics. Has felt lightheaded but no syncope. Daughter measured his BP when he was pre-syncopal and it was in the 80s. Called CHF nurse who recommend that they seek emergency care.  Reports associated left lower quadrant pain with the diarrhea. "Fluoroscent yellow green" in color and very foul smelling. Last abx were in the hospital post-operatively. Had intermittent diarrhea in the hospital they report that was negative for c. Diff and finally associated to sensistivity to stool softeners.   In the ED, stool sent for C. Diff. Admitted to medicine who started empiric antibiotics.  Reports continuing to take his medications as prescribed. No CHF symptoms of worsening LE edema, PND, orthopnea, abdominal swelling. Actually has lost weight recently going from 232 at discharge down to 226 at home and 222 by the scale in the ER.  Past Medical History  Diagnosis Date  . Acid reflux   . Family history of anesthesia complication     daughter had problem waking up  . Pneumonia     hx of  . Sleep apnea     no treatment for, last sleep study over 5 years  ago  . Abdominal aortic aneurysm     found 06/29/12 "small"  . Headache(784.0)   . Herniated vertebral disc     thoracic area  . Cataracts, bilateral     hx of  . H/O Legionnaire's disease     about 24 years ago  . Anxiety     panic attacks   . Claustrophobia     "severe"  . Hypertension     sees  Dr. Ronne Binning [phone]  . Obesity (BMI 35.0-39.9 without comorbidity) 10/06/2013  . Essential hypertension 10/06/2013  . Anginal pain   . Atrial fibrillation     Postoperative  . Acute renal insufficiency     Postoperative  . Anemia associated with acute blood loss     Postoperative      Surgical History:  Past Surgical History  Procedure Laterality Date  . Shoulder surgery      left  . Cataract extraction w/ intraocular lens implant      bilateral  . Lumbar laminectomy/decompression microdiscectomy  07/20/2012    Procedure: LUMBAR LAMINECTOMY/DECOMPRESSION MICRODISCECTOMY 1 LEVEL;  Surgeon: Reinaldo Meeker, MD;  Location: [facility];  Service: Neurosurgery;  Laterality: N/A;  lumbar four-five left  . Back surgery    . Cardiac catheterization  10/05/2013  . Eye surgery    . Coronary artery bypass graft N/A 10/08/2013    Procedure: CORONARY ARTERY BYPASS GRAFTING (CABG);  Surgeon: Kerin Perna, MD;  Location: [facility] OR;  Service: Open Heart Surgery;  Laterality: N/A;  Times 2 using left internal mammary artery and endoscopically  harvested left saphenous vein  . Intraoperative transesophageal echocardiogram N/A 10/08/2013    Procedure: INTRAOPERATIVE TRANSESOPHAGEAL ECHOCARDIOGRAM;  Surgeon: Kerin Perna, MD;  Location: [facility] OR;  Service: Open Heart Surgery;  Laterality: N/A;     Home Meds: Prior to Admission medications   Medication Sig Start Date End Date Taking? Authorizing Provider  ALPRAZolam (XANAX) 0.25 MG tablet Take 1 tablet (0.25 mg total) by mouth 3 (three) times daily as needed for anxiety. 11/08/13  Yes Amy D Clegg, NP  ALPRAZolam (XANAX) 1 MG tablet Take 1 tablet  (1 mg total) by mouth at bedtime as needed for anxiety. 11/08/13  Yes Amy D Clegg, NP  Ascorbic Acid (VITAMIN C) 1000 MG tablet Take 1,000 mg by mouth daily.   Yes Historical Provider, MD  aspirin EC 81 MG tablet Take 81 mg by mouth daily.   Yes Historical Provider, MD  atorvastatin (LIPITOR) 80 MG tablet Take 1 tablet (80 mg total) by mouth daily at 6 PM. 11/08/13  Yes Amy D Clegg, NP  cholecalciferol (VITAMIN D) 1000 UNITS tablet Take 2,000 Units by mouth daily.   Yes Historical Provider, MD  digoxin (LANOXIN) 0.125 MG tablet Take 1 tablet (0.125 mg total) by mouth daily. 11/08/13  Yes Amy D Clegg, NP  docusate sodium 100 MG CAPS Take 200 mg by mouth 2 (two) times daily as needed for mild constipation. 10/25/13  Yes Erin Barrett, PA-C  enalapril (VASOTEC) 2.5 MG tablet Take 1 tablet (2.5 mg total) by mouth 2 (two) times daily. 11/08/13  Yes Amy D Clegg, NP  feeding supplement, GLUCERNA SHAKE, (GLUCERNA SHAKE) LIQD Take 237 mLs by mouth 3 (three) times daily between meals as needed (meal replacement).   Yes Historical Provider, MD  Multiple Vitamin (MULTIVITAMIN WITH MINERALS) TABS tablet Take 1 tablet by mouth daily.   Yes Historical Provider, MD  nitroGLYCERIN (NITROSTAT) 0.3 MG SL tablet Place 1 tablet (0.3 mg total) under the tongue every 5 (five) minutes as needed for chest pain. 11/08/13  Yes Amy D Clegg, NP  oxyCODONE (OXY IR/ROXICODONE) 5 MG immediate release tablet Take 1 tablet (5 mg total) by mouth every 6 (six) hours as needed for moderate pain. 11/08/13  Yes Amy D Clegg, NP  potassium chloride SA (K-DUR,KLOR-CON) 20 MEQ tablet Take 1 tablet (20 mEq total) by mouth 2 (two) times daily. 11/08/13  Yes Amy D Filbert Schilder, NP  Rivaroxaban (XARELTO) 15 MG TABS tablet Take 1 tablet (15 mg total) by mouth 2 (two) times daily with a meal. 11/08/13 11/22/13 Yes Amy D Clegg, NP  spironolactone (ALDACTONE) 25 MG tablet Take 1 tablet (25 mg total) by mouth daily. 11/08/13  Yes Amy D Clegg, NP  torsemide (DEMADEX) 20  MG tablet Take 1 tablet (20 mg total) by mouth 2 (two) times daily. 11/08/13  Yes Amy D Clegg, NP  Rivaroxaban (XARELTO) 20 MG TABS tablet Take 1 tablet (20 mg total) by mouth daily with supper. 11/22/13   Sherald Hess, NP    Inpatient Medications:    Allergies:  Allergies  Allergen Reactions  . Demerol [Meperidine] Other (See Comments)    "makes me crazy"  . Lisinopril Diarrhea  . Shellfish Allergy Rash    History   Social History  . Marital Status: Widowed    Spouse Name: N/A    Number of Children: N/A  . Years of Education: N/A   Occupational History  . Retired    Social History Main Topics  . Smoking status: Former Smoker --  0.50 packs/day    Types: Cigarettes, Cigars  . Smokeless tobacco: Never Used     Comment: 50 years ago  . Alcohol Use: No  . Drug Use: No  . Sexual Activity: No   Other Topics Concern  . Not on file   Social History Narrative   Was living alone prior to admission.     No family history on file.   Review of Systems: General: negative for chills, fever, night sweats Cardiovascular: see HPI Dermatological: negative for rash Respiratory: negative for cough or wheezing Urologic: negative for hematuria Abdominal: see HPI Neurologic: negative for visual changes, syncope, All other systems reviewed and are otherwise negative except as noted above.  Labs:  Recent Labs  11/14/13 2009  TROPONINI <0.30   Lab Results  Component Value Date   WBC 6.5 11/14/2013   HGB 9.2* 11/14/2013   HCT 29.2* 11/14/2013   MCV 91.8 11/14/2013   PLT 355 11/14/2013    Recent Labs Lab 11/14/13 2009  NA 136  K 3.7  CL 96  CO2 30  BUN 34*  CREATININE 1.69*  CALCIUM 8.8  PROT 6.0  BILITOT 0.8  ALKPHOS 105  ALT 46  AST 31  GLUCOSE 98    Radiology/Studies:  1   Dg Abd Acute W/chest  11/14/2013   CLINICAL DATA:  History of MI and CHF.  Hypertension.  Diarrhea.  EXAM: ACUTE ABDOMEN SERIES (ABDOMEN 2 VIEW & CHEST 1 VIEW)  COMPARISON:   11/06/2013  FINDINGS: Normal bowel gas pattern. No free air. Abdominal soft tissues show mild aortic calcifications but are otherwise unremarkable.  Frontal chest radiograph shows changes from prior CABG surgery. The lungs are clear.  IMPRESSION: No acute findings. No obstruction or free air. No acute cardiopulmonary disease on the chest radiograph.   Electronically Signed   By: Amie Portland M.D.   On: 11/14/2013 21:25    EKG: sinus, septal infarct, low voltage, unchanged from 10/2013  Physical Exam: Blood pressure 123/57, pulse 89, temperature 97.7 F (36.5 C), temperature source Oral, resp. rate 14, SpO2 99.00%. General: in NAD, frail appearing Head: Normocephalic, atraumatic, sclera non-icteric, no xanthomas, nares are without discharge.  Neck: Supple. Negative for carotid bruits. JVD not elevated. Lungs: Clear bilaterally to auscultation without wheezes, rales, or rhonchi. Breathing is unlabored. Heart: irreg No murmurs, rubs, or gallops appreciated. Abdomen: Soft, non-tender, non-distended with increased active bowel sounds. No hepatomegaly. No rebound/guarding. No obvious abdominal masses. Msk:  Strength and tone appear normal for age. Extremities: No clubbing or cyanosis. No edema.  Distal pedal pulses are 2+ and equal bilaterally. Neuro: Alert and oriented X 3. Moves all extremities spontaneously. Psych:  Responds to questions appropriately with a normal affect.   Problem List 1. Diarrhea, r/o infectious 2. CAD s/p recent cabg 3. Ischemic CMP, appears euvolemic by exam, possibly dehydrated based upon history and daily weights 4. CKD, slightly worse, likely pre-renal 5. Anemia 6. Parox afib, in sinus 7. H/o DVT, on xarelto  Assessment and Plan:  79 y.o. male w/ PMHx significant for HTN, CAD s/p CABG, post op afib  who presented to [facility] on 11/14/2013 with complaints of fatigue and diarrhea. Given his recent hospitalization, infectious causes of diarrhea need to be  evaluated.  In regards to his cardiac health, by history and by daily weights (232 --> 226) he is likely mildly dehydrated and has been symptomatic at home with orthostasis (daughter checked BPs and they decreased with ambulating to the bathroom). Cxray wihtout  fluid overload. No sure how to interpret his elevated BNP but is noted to be half of prior value.  Would be reasonable to hold diuretics (torsemide and spironlactone) and encourage PO hydration for at least 1 day and re-assess on a daily basis.  Recommend continuing his other heart medications including enalapril and digoxin and atorvastatin.    Thank you for this consult. Will follow with you. Please do not hesitate to call with questions.  Signed, Adolm Joseph, Troy Sine MD 11/14/2013, 11:44 PM

## 2013-11-15 ENCOUNTER — Ambulatory Visit (HOSPITAL_COMMUNITY): Payer: Medicare (Managed Care)

## 2013-11-15 ENCOUNTER — Encounter (HOSPITAL_COMMUNITY): Payer: Self-pay | Admitting: General Practice

## 2013-11-15 DIAGNOSIS — R5381 Other malaise: Secondary | ICD-10-CM

## 2013-11-15 DIAGNOSIS — N189 Chronic kidney disease, unspecified: Secondary | ICD-10-CM

## 2013-11-15 DIAGNOSIS — I2589 Other forms of chronic ischemic heart disease: Secondary | ICD-10-CM

## 2013-11-15 DIAGNOSIS — I5022 Chronic systolic (congestive) heart failure: Secondary | ICD-10-CM

## 2013-11-15 DIAGNOSIS — R197 Diarrhea, unspecified: Principal | ICD-10-CM

## 2013-11-15 DIAGNOSIS — I2510 Atherosclerotic heart disease of native coronary artery without angina pectoris: Secondary | ICD-10-CM

## 2013-11-15 LAB — BASIC METABOLIC PANEL
BUN: 32 mg/dL — ABNORMAL HIGH (ref 6–23)
CO2: 29 mEq/L (ref 19–32)
Calcium: 8.5 mg/dL (ref 8.4–10.5)
Creatinine, Ser: 1.46 mg/dL — ABNORMAL HIGH (ref 0.50–1.35)
GFR calc non Af Amer: 44 mL/min — ABNORMAL LOW (ref >90)
Glucose, Bld: 88 mg/dL (ref 70–99)
Potassium: 3.9 mEq/L (ref 3.5–5.1)

## 2013-11-15 LAB — URINALYSIS, ROUTINE W REFLEX MICROSCOPIC
Bilirubin Urine: NEGATIVE
Glucose, UA: NEGATIVE mg/dL
Hgb urine dipstick: NEGATIVE
Ketones, ur: NEGATIVE mg/dL
Nitrite: NEGATIVE
Specific Gravity, Urine: 1.017 (ref 1.005–1.030)
pH: 5 (ref 5.0–8.0)

## 2013-11-15 LAB — CBC
HCT: 28.2 % — ABNORMAL LOW (ref 39.0–52.0)
Hemoglobin: 9.1 g/dL — ABNORMAL LOW (ref 13.0–17.0)
MCH: 29.1 pg (ref 26.0–34.0)
MCHC: 32.3 g/dL (ref 30.0–36.0)
MCV: 90.1 fL (ref 78.0–100.0)
RBC: 3.13 MIL/uL — ABNORMAL LOW (ref 4.22–5.81)
WBC: 5.3 10*3/uL (ref 4.0–10.5)

## 2013-11-15 LAB — CLOSTRIDIUM DIFFICILE BY PCR: Toxigenic C. Difficile by PCR: NEGATIVE

## 2013-11-15 MED ORDER — ALPRAZOLAM 0.5 MG PO TABS
1.0000 mg | ORAL_TABLET | Freq: Every evening | ORAL | Status: DC | PRN
  Administered 2013-11-15: 1 mg via ORAL
  Filled 2013-11-15: qty 2

## 2013-11-15 MED ORDER — ASPIRIN EC 81 MG PO TBEC
81.0000 mg | DELAYED_RELEASE_TABLET | Freq: Every day | ORAL | Status: DC
  Administered 2013-11-15: 81 mg via ORAL
  Filled 2013-11-15: qty 1

## 2013-11-15 MED ORDER — ALPRAZOLAM 0.25 MG PO TABS
0.2500 mg | ORAL_TABLET | Freq: Three times a day (TID) | ORAL | Status: DC | PRN
  Administered 2013-11-15: 0.25 mg via ORAL
  Filled 2013-11-15: qty 1

## 2013-11-15 MED ORDER — ATORVASTATIN CALCIUM 80 MG PO TABS
80.0000 mg | ORAL_TABLET | Freq: Every day | ORAL | Status: DC
  Administered 2013-11-15: 80 mg via ORAL
  Filled 2013-11-15 (×2): qty 1

## 2013-11-15 MED ORDER — RIVAROXABAN 15 MG PO TABS
15.0000 mg | ORAL_TABLET | Freq: Two times a day (BID) | ORAL | Status: DC
  Administered 2013-11-15 (×2): 15 mg via ORAL
  Filled 2013-11-15 (×3): qty 1

## 2013-11-15 MED ORDER — METRONIDAZOLE 500 MG PO TABS
500.0000 mg | ORAL_TABLET | Freq: Three times a day (TID) | ORAL | Status: DC
  Administered 2013-11-15: 500 mg via ORAL
  Filled 2013-11-15 (×4): qty 1

## 2013-11-15 MED ORDER — OXYCODONE HCL 5 MG PO TABS: 5.0000 mg | ORAL_TABLET | Freq: Four times a day (QID) | ORAL | Status: DC | PRN

## 2013-11-15 MED ORDER — SODIUM CHLORIDE 0.9 % IJ SOLN: 3.0000 mL | Freq: Two times a day (BID) | INTRAMUSCULAR | Status: DC

## 2013-11-15 MED ORDER — SPIRONOLACTONE 25 MG PO TABS: 25.0000 mg | ORAL_TABLET | Freq: Every day | ORAL | Status: DC

## 2013-11-15 MED ORDER — ACETAMINOPHEN 325 MG PO TABS
650.0000 mg | ORAL_TABLET | Freq: Four times a day (QID) | ORAL | Status: DC | PRN
  Filled 2013-11-15: qty 2

## 2013-11-15 MED ORDER — DIGOXIN 125 MCG PO TABS
0.1250 mg | ORAL_TABLET | Freq: Every day | ORAL | Status: DC
  Administered 2013-11-15: 0.125 mg via ORAL
  Filled 2013-11-15 (×2): qty 1

## 2013-11-15 MED ORDER — VITAMIN D3 25 MCG (1000 UNIT) PO TABS
2000.0000 [IU] | ORAL_TABLET | Freq: Every day | ORAL | Status: DC
  Administered 2013-11-15: 2000 [IU] via ORAL
  Filled 2013-11-15: qty 2

## 2013-11-15 MED ORDER — TORSEMIDE 20 MG PO TABS: 20.0000 mg | ORAL_TABLET | Freq: Two times a day (BID) | ORAL | Status: DC

## 2013-11-15 MED ORDER — LOPERAMIDE HCL 2 MG PO CAPS
4.0000 mg | ORAL_CAPSULE | Freq: Once | ORAL | Status: AC
  Administered 2013-11-15: 4 mg via ORAL
  Filled 2013-11-15: qty 2

## 2013-11-15 MED ORDER — TORSEMIDE 20 MG PO TABS
20.0000 mg | ORAL_TABLET | Freq: Two times a day (BID) | ORAL | Status: DC
  Filled 2013-11-15: qty 1

## 2013-11-15 MED ORDER — ENALAPRIL MALEATE 2.5 MG PO TABS
2.5000 mg | ORAL_TABLET | Freq: Two times a day (BID) | ORAL | Status: DC
  Administered 2013-11-15 (×2): 2.5 mg via ORAL
  Filled 2013-11-15 (×3): qty 1

## 2013-11-15 MED ORDER — POTASSIUM CHLORIDE CRYS ER 20 MEQ PO TBCR
20.0000 meq | EXTENDED_RELEASE_TABLET | Freq: Two times a day (BID) | ORAL | Status: DC
  Administered 2013-11-15 (×2): 20 meq via ORAL
  Filled 2013-11-15 (×3): qty 1

## 2013-11-15 MED ORDER — DOCUSATE SODIUM 100 MG PO CAPS: 200.0000 mg | ORAL_CAPSULE | Freq: Two times a day (BID) | ORAL | Status: DC | PRN

## 2013-11-15 MED ORDER — LOPERAMIDE HCL 2 MG PO CAPS: 2.0000 mg | ORAL_CAPSULE | ORAL | Status: DC | PRN

## 2013-11-15 MED ORDER — NITROGLYCERIN 0.4 MG SL SUBL: 0.4000 mg | SUBLINGUAL_TABLET | SUBLINGUAL | Status: DC | PRN

## 2013-11-15 MED ORDER — GLUCERNA SHAKE PO LIQD: 237.0000 mL | Freq: Three times a day (TID) | ORAL | Status: DC | PRN

## 2013-11-15 MED ORDER — ADULT MULTIVITAMIN W/MINERALS CH
1.0000 | ORAL_TABLET | Freq: Every day | ORAL | Status: DC
  Administered 2013-11-15: 1 via ORAL
  Filled 2013-11-15: qty 1

## 2013-11-15 MED ORDER — ACETAMINOPHEN 325 MG PO TABS: 650.0000 mg | ORAL_TABLET | Freq: Four times a day (QID) | ORAL | Status: AC | PRN

## 2013-11-15 NOTE — H&P (Signed)
[facility] History and Physical  Antonio Bernard ZOX:[id number] DOB: 05/22/1933 DOA: 11/14/2013  Referring physician: EDP PCP: Thayer Headings, MD   Chief Complaint: Diarrhea   HPI: Antonio Bernard is a 79 y.o. male who was just discharged from the hospital on 12/5 after completing a 35 day hospital and rehab stay secondary to an anterior wall STEMI on 10/05/13.  During that visit numerous interventions were performed and ultimately patient ended up having a CABG on 11/4.  His EF was significantly diminished (patient has a life vest defib at home now), and he was discharged on increased doses of diuretics.  He had been doing ok at home until today when he developed numerous episodes of very foul smelling diarrhea.  No N/V, and is still able to take fluids PO at this time.  However, his home nurse upon hearing about this became concerned for development of C.Diff and sent the patient into the ED.   Review of Systems: Systems reviewed.  As above, otherwise negative  Past Medical History  Diagnosis Date  . Acid reflux   . Family history of anesthesia complication     daughter had problem waking up  . Pneumonia     hx of  . Sleep apnea     no treatment for, last sleep study over 5 years ago  . Abdominal aortic aneurysm     found 06/29/12 "small"  . Headache(784.0)   . Herniated vertebral disc     thoracic area  . Cataracts, bilateral     hx of  . H/O Legionnaire's disease     about 24 years ago  . Anxiety     panic attacks   . Claustrophobia     "severe"  . Hypertension     sees  Dr. Ronne Binning [phone]  . Obesity (BMI 35.0-39.9 without comorbidity) 10/06/2013  . Essential hypertension 10/06/2013  . Anginal pain   . Atrial fibrillation     Postoperative  . Acute renal insufficiency     Postoperative  . Anemia associated with acute blood loss     Postoperative   Past Surgical History  Procedure Laterality Date  . Shoulder surgery      left  . Cataract  extraction w/ intraocular lens implant      bilateral  . Lumbar laminectomy/decompression microdiscectomy  07/20/2012    Procedure: LUMBAR LAMINECTOMY/DECOMPRESSION MICRODISCECTOMY 1 LEVEL;  Surgeon: Reinaldo Meeker, MD;  Location: [facility];  Service: Neurosurgery;  Laterality: N/A;  lumbar four-five left  . Back surgery    . Cardiac catheterization  10/05/2013  . Eye surgery    . Coronary artery bypass graft N/A 10/08/2013    Procedure: CORONARY ARTERY BYPASS GRAFTING (CABG);  Surgeon: Kerin Perna, MD;  Location: [facility] OR;  Service: Open Heart Surgery;  Laterality: N/A;  Times 2 using left internal mammary artery and endoscopically harvested left saphenous vein  . Intraoperative transesophageal echocardiogram N/A 10/08/2013    Procedure: INTRAOPERATIVE TRANSESOPHAGEAL ECHOCARDIOGRAM;  Surgeon: Kerin Perna, MD;  Location: [facility] OR;  Service: Open Heart Surgery;  Laterality: N/A;   Social History:  reports that he has quit smoking. His smoking use included Cigarettes and Cigars. He smoked 0.50 packs per day. He has never used smokeless tobacco. He reports that he does not drink alcohol or use illicit drugs.  Allergies  Allergen Reactions  . Demerol [Meperidine] Other (See Comments)    "makes me crazy"  . Lisinopril Diarrhea  . Shellfish Allergy Rash  History reviewed. No pertinent family history.   Prior to Admission medications   Medication Sig Start Date End Date Taking? Authorizing Provider  ALPRAZolam (XANAX) 0.25 MG tablet Take 1 tablet (0.25 mg total) by mouth 3 (three) times daily as needed for anxiety. 11/08/13  Yes Amy D Clegg, NP  ALPRAZolam (XANAX) 1 MG tablet Take 1 tablet (1 mg total) by mouth at bedtime as needed for anxiety. 11/08/13  Yes Amy D Clegg, NP  Ascorbic Acid (VITAMIN C) 1000 MG tablet Take 1,000 mg by mouth daily.   Yes Historical Provider, MD  aspirin EC 81 MG tablet Take 81 mg by mouth daily.   Yes Historical Provider, MD  atorvastatin (LIPITOR) 80 MG  tablet Take 1 tablet (80 mg total) by mouth daily at 6 PM. 11/08/13  Yes Amy D Clegg, NP  cholecalciferol (VITAMIN D) 1000 UNITS tablet Take 2,000 Units by mouth daily.   Yes Historical Provider, MD  digoxin (LANOXIN) 0.125 MG tablet Take 1 tablet (0.125 mg total) by mouth daily. 11/08/13  Yes Amy D Clegg, NP  docusate sodium 100 MG CAPS Take 200 mg by mouth 2 (two) times daily as needed for mild constipation. 10/25/13  Yes Erin Barrett, PA-C  enalapril (VASOTEC) 2.5 MG tablet Take 1 tablet (2.5 mg total) by mouth 2 (two) times daily. 11/08/13  Yes Amy D Clegg, NP  feeding supplement, GLUCERNA SHAKE, (GLUCERNA SHAKE) LIQD Take 237 mLs by mouth 3 (three) times daily between meals as needed (meal replacement).   Yes Historical Provider, MD  Multiple Vitamin (MULTIVITAMIN WITH MINERALS) TABS tablet Take 1 tablet by mouth daily.   Yes Historical Provider, MD  nitroGLYCERIN (NITROSTAT) 0.3 MG SL tablet Place 1 tablet (0.3 mg total) under the tongue every 5 (five) minutes as needed for chest pain. 11/08/13  Yes Amy D Clegg, NP  oxyCODONE (OXY IR/ROXICODONE) 5 MG immediate release tablet Take 1 tablet (5 mg total) by mouth every 6 (six) hours as needed for moderate pain. 11/08/13  Yes Amy D Clegg, NP  potassium chloride SA (K-DUR,KLOR-CON) 20 MEQ tablet Take 1 tablet (20 mEq total) by mouth 2 (two) times daily. 11/08/13  Yes Amy D Filbert Schilder, NP  Rivaroxaban (XARELTO) 15 MG TABS tablet Take 1 tablet (15 mg total) by mouth 2 (two) times daily with a meal. 11/08/13 11/22/13 Yes Amy D Clegg, NP  spironolactone (ALDACTONE) 25 MG tablet Take 1 tablet (25 mg total) by mouth daily. 11/08/13  Yes Amy D Clegg, NP  torsemide (DEMADEX) 20 MG tablet Take 1 tablet (20 mg total) by mouth 2 (two) times daily. 11/08/13  Yes Amy D Clegg, NP  Rivaroxaban (XARELTO) 20 MG TABS tablet Take 1 tablet (20 mg total) by mouth daily with supper. 11/22/13   Amy Georgie Chard, NP   Physical Exam: Filed Vitals:   11/14/13 2345  BP: 121/60  Pulse: 88   Temp:   Resp: 23    BP 121/60  Pulse 88  Temp(Src) 97.7 F (36.5 C) (Oral)  Resp 23  SpO2 100%  General Appearance:    Alert, oriented, no distress, appears stated age  Head:    Normocephalic, atraumatic  Eyes:    PERRL, EOMI, sclera non-icteric        Nose:   Nares without drainage or epistaxis. Mucosa, turbinates normal  Throat:   Moist mucous membranes. Oropharynx without erythema or exudate.  Neck:   Supple. No carotid bruits.  No thyromegaly.  No lymphadenopathy.   Back:  No CVA tenderness, no spinal tenderness  Lungs:     Clear to auscultation bilaterally, without wheezes, rhonchi or rales  Chest wall:    No tenderness to palpitation  Heart:    Regular rate and rhythm without murmurs, gallops, rubs  Abdomen:     Soft, non-tender, nondistended, normal bowel sounds, no organomegaly  Genitalia:    deferred  Rectal:    deferred  Extremities:   No clubbing, cyanosis or edema.  Pulses:   2+ and symmetric all extremities  Skin:   Skin color, texture, turgor normal, no rashes or lesions  Lymph nodes:   Cervical, supraclavicular, and axillary nodes normal  Neurologic:   CNII-XII intact. Normal strength, sensation and reflexes      throughout    Labs on Admission:  Basic Metabolic Panel:  Recent Labs Lab 11/08/13 0520 11/14/13 2009  NA 132* 136  K 3.6 3.7  CL 90* 96  CO2 37* 30  GLUCOSE 84 98  BUN 38* 34*  CREATININE 1.55* 1.69*  CALCIUM 8.2* 8.8   Liver Function Tests:  Recent Labs Lab 11/14/13 2009  AST 31  ALT 46  ALKPHOS 105  BILITOT 0.8  PROT 6.0  ALBUMIN 2.9*   No results found for this basename: LIPASE, AMYLASE,  in the last 168 hours No results found for this basename: AMMONIA,  in the last 168 hours CBC:  Recent Labs Lab 11/14/13 2009  WBC 6.5  NEUTROABS 4.1  HGB 9.2*  HCT 29.2*  MCV 91.8  PLT 355   Cardiac Enzymes:  Recent Labs Lab 11/14/13 2009  TROPONINI <0.30    BNP (last 3 results)  Recent Labs  10/07/13 0400  10/17/13 0415 11/14/13 2009  PROBNP 3092.0* 30734.0* 18488.0*   CBG: No results found for this basename: GLUCAP,  in the last 168 hours  Radiological Exams on Admission: Dg Abd Acute W/chest  11/14/2013   CLINICAL DATA:  History of MI and CHF.  Hypertension.  Diarrhea.  EXAM: ACUTE ABDOMEN SERIES (ABDOMEN 2 VIEW & CHEST 1 VIEW)  COMPARISON:  11/06/2013  FINDINGS: Normal bowel gas pattern. No free air. Abdominal soft tissues show mild aortic calcifications but are otherwise unremarkable.  Frontal chest radiograph shows changes from prior CABG surgery. The lungs are clear.  IMPRESSION: No acute findings. No obstruction or free air. No acute cardiopulmonary disease on the chest radiograph.   Electronically Signed   By: Amie Portland M.D.   On: 11/14/2013 21:25    EKG: Independently reviewed.  Assessment/Plan Active Problems:   Diarrhea   1. Diarrhea - C.Diff pending, do not feel patient is dehydrated at this point and is able to take POs so will continue home meds.  Strict intake and output, repeat BMP in AM.  Patient on PO flagyl for empiric treatment of C.Diff, story is very strong for C.Diff. 2. Unable to get routine IV access - patient and family have been offered EJ by EDP but have declined IV access at this time due to patients anxiety.  Explained risks to them including but not limited to delay in treatment in case of an emergency such as cardiac arrest while in hospital.  However, given that we currently do NOT have any plans to start IVF just yet, as I do not feel that he is dehydrated at this point, do not have any IV meds planned at this time, so will not push the issue further for now.    Code Status: Full  Family Communication: Family at  bedside Disposition Plan: Admit to inpatient   Time spent: 50 min  Yanni Ruberg M. [facility] Pager [phone]  If 7AM-7PM, please contact the day team taking care of the patient [email or web address] Password [facility] 11/15/2013, 12:04  AM

## 2013-11-15 NOTE — Discharge Summary (Signed)
Physician Discharge Summary  Antonio Bernard VWU:[id number] DOB: 1933/02/16 DOA: 11/14/2013  PCP: Thayer Headings, MD  Admit date: 11/14/2013 Discharge date: 11/15/2013  Discharge Diagnoses:  Active Problems:   Diarrhea, likely viral gastroenteritis   Chronic systolic heart failure  Discharge Condition: stable  Filed Weights   11/15/13 0012  Weight: 100.971 kg (222 lb 9.6 oz)    History of present illness:  79 y.o. male who was just discharged from the hospital on 12/5 after completing a 35 day hospital and rehab stay secondary to an anterior wall STEMI on 10/05/13. During that visit numerous interventions were performed and ultimately patient ended up having a CABG on 11/4. His EF was significantly diminished (patient has a life vest defib at home now), and he was discharged on increased doses of diuretics.  He had been doing ok at home until today when he developed numerous episodes of very foul smelling diarrhea. No N/V, and is still able to take fluids PO at this time. However, his home nurse upon hearing about this became concerned for development of C.Diff and sent the patient into the ED.  Hospital Course:  Admitted to hospitalist service.  c diff pcr negative.  Diarrhea somewhat improved.  Started on imodium.  Tolerating solid diet. Requesting discharge.  Seen by CHF team no evidence of acute CHF.    Procedures:  none  Consult [facility]  Discharge Exam: Filed Vitals:   11/15/13 0900  BP: 116/57  Pulse: 89  Temp: 97.6 F (36.4 C)  Resp: 18    General: alert. Oriented. Nontoxic. Breathing nonlabored Cardiovascular: RRR Respiratory: CTA Abd S, NT, ND Ext no CCE  Discharge Instructions  Discharge Orders   Future Appointments Provider Department Dept Phone   11/22/2013 10:00 AM [facility] [phone]   12/04/2013 10:00 AM Antonio Perna, MD [facility]  [phone]   Future Orders Complete By Expires   Diet - low sodium heart healthy  As directed    Discharge instructions  As directed    Comments:     HOLD TORSEMIDE FOR 3 DAYS, THEN RESUME MONDAY   [place]   As directed        Medication List    STOP taking these medications       DSS 100 MG Caps      TAKE these medications       acetaminophen 325 MG tablet  Commonly known as:  TYLENOL  Take 2 tablets (650 mg total) by mouth every 6 (six) hours as needed for mild pain, fever or headache.     ALPRAZolam 0.25 MG tablet  Commonly known as:  XANAX  Take 1 tablet (0.25 mg total) by mouth 3 (three) times daily as needed for anxiety.     ALPRAZolam 1 MG tablet  Commonly known as:  XANAX  Take 1 tablet (1 mg total) by mouth at bedtime as needed for anxiety.     aspirin EC 81 MG tablet  Take 81 mg by mouth daily.     atorvastatin 80 MG tablet  Commonly known as:  LIPITOR  Take 1 tablet (80 mg total) by mouth daily at 6 PM.     cholecalciferol 1000 UNITS tablet  Commonly known as:  VITAMIN D  Take 2,000 Units by mouth daily.     digoxin 0.125 MG tablet  Commonly known as:  LANOXIN  Take 1 tablet (0.125 mg total) by mouth daily.  enalapril 2.5 MG tablet  Commonly known as:  VASOTEC  Take 1 tablet (2.5 mg total) by mouth 2 (two) times daily.     feeding supplement (GLUCERNA SHAKE) Liqd  Take 237 mLs by mouth 3 (three) times daily between meals as needed (meal replacement).     loperamide 2 MG capsule  Commonly known as:  IMODIUM  Take 1 capsule (2 mg total) by mouth as needed for diarrhea or loose stools (maximum 6 doses per day).     multivitamin with minerals Tabs tablet  Take 1 tablet by mouth daily.     nitroGLYCERIN 0.3 MG SL tablet  Commonly known as:  NITROSTAT  Place 1 tablet (0.3 mg total) under the tongue every 5 (five) minutes as needed for chest pain.     oxyCODONE 5 MG immediate release tablet  Commonly known as:  Oxy IR/ROXICODONE  Take 1 tablet  (5 mg total) by mouth every 6 (six) hours as needed for moderate pain.     potassium chloride SA 20 MEQ tablet  Commonly known as:  K-DUR,KLOR-CON  Take 1 tablet (20 mEq total) by mouth 2 (two) times daily.     Rivaroxaban 15 MG Tabs tablet  Commonly known as:  XARELTO  Take 1 tablet (15 mg total) by mouth 2 (two) times daily with a meal.     Rivaroxaban 20 MG Tabs tablet  Commonly known as:  XARELTO  Take 1 tablet (20 mg total) by mouth daily with supper.  Start taking on:  11/22/2013     spironolactone 25 MG tablet  Commonly known as:  ALDACTONE  Take 1 tablet (25 mg total) by mouth daily.     torsemide 20 MG tablet  Commonly known as:  DEMADEX  Take 1 tablet (20 mg total) by mouth 2 (two) times daily.     vitamin C 1000 MG tablet  Take 1,000 mg by mouth daily.       Allergies  Allergen Reactions  . Demerol [Meperidine] Other (See Comments)    "makes me crazy"  . Lisinopril Diarrhea  . Shellfish Allergy Rash       Follow-up Information   Follow up with Antonio Meres, MD In 1 week. (HIS OFFICE WITH CALL YOU WITH APPOINTMENT)    Specialty:  Cardiology   Contact information:   [address] [place] [address] [phone]        The results of significant diagnostics from this hospitalization (including imaging, microbiology, ancillary and laboratory) are listed below for reference.    Significant Diagnostic Studies: Dg Chest 2 View  11/06/2013   CLINICAL DATA:  Cough.  EXAM: CHEST  2 VIEW  COMPARISON:  October 30, 2013.  FINDINGS: The lungs are adequately inflated. Small bilateral pleural effusions are present layering laterally and posteriorly. The interstitial markings are minimally prominent but have improved since the previous study. The cardiopericardial silhouette is top-normal in size. Coarse lung markings in the retrocardiac region likely reflect subsegmental atelectasis. The patient has undergone previous CABG. The mediastinum is  normal in width. There is no pleural effusion.  IMPRESSION: The findings suggest improving CHF with small bilateral pleural effusions.   Electronically Signed   By: David  [place]   On: 11/06/2013 08:25   Dg Chest 2 View  10/30/2013   CLINICAL DATA:  Pleural effusion.  Shortness of breath.  EXAM: CHEST  2 VIEW  COMPARISON:  Chest x-ray 10/27/2013.  FINDINGS: There is a right upper extremity PICC with tip  terminating in the mid to distal superior vena cava. External pacemaker/ defibrillator overlies the thorax. Lung volumes are low. There is cephalization of the pulmonary vasculature, indistinctness of the interstitial markings, and patchy airspace disease throughout the lungs bilaterally suggestive of moderate pulmonary edema. Moderate bilateral pleural effusions. Mild cardiomegaly. Upper mediastinal contours are within limits. Atherosclerosis in the thoracic status post median sternotomy  IMPRESSION: 1. Findings again compatible with congestive heart failure, as discussed above. Moderate bilateral pleural effusions appear slightly larger, but there has otherwise been little change compared with examination from 10/27/2013. 2. Atherosclerosis. 3. Support apparatus and postoperative changes, as above.   Electronically Signed   By: Trudie Reed M.D.   On: 10/30/2013 08:24   Dg Chest 2 View  10/23/2013   CLINICAL DATA:  CABG.  Shortness of breath.  EXAM: CHEST  2 VIEW  COMPARISON:  10/21/2013.  FINDINGS: Right PICC line in stable position. CABG. Cardiomegaly. Normal pulmonary vascularity. Small stable left pleural effusion. No pneumothorax. No focal pulmonary infiltrate. No acute bony abnormality.  IMPRESSION: 1. CABG. Stable cardiomegaly. No pulmonary edema or pulmonary venous congestion. 2. Stable small left pleural effusion. 3. Stable PICC line position.   Electronically Signed   By: Maisie Fus  Register   On: 10/23/2013 07:05   Dg Chest Port 1 View  10/27/2013   CLINICAL DATA:  Short of breath  EXAM:  PORTABLE CHEST - 1 VIEW  COMPARISON:  10/23/2013  FINDINGS: Progress of bilateral airspace disease consistent with pulmonary edema. Increase in bilateral effusion and bibasilar atelectasis.  Right arm PICC tip in the SVC unchanged.  IMPRESSION: Progressive heart failure with edema and bilateral pleural effusions.   Electronically Signed   By: Marlan Palau M.D.   On: 10/27/2013 16:43   Dg Chest Port 1 View  10/21/2013   CLINICAL DATA:  Respiratory distress  EXAM: PORTABLE CHEST - 1 VIEW  COMPARISON:  10/17/2013  FINDINGS: There has been further improvement. Less opacity is noted at the bases, with the residual opacity likely atelectasis. No pulmonary edema is evident.  Cardiac silhouette is normal in size. Mediastinum is normal in contour. No pneumothorax.  Right PICC is stable well-positioned.  IMPRESSION: 1. Further improvement in lung aeration. No residual edema. Mild persistent lung base opacity most evident on the left is likely atelectasis.   Electronically Signed   By: Amie Portland M.D.   On: 10/21/2013 07:20   Dg Chest Port 1 View  10/17/2013   CLINICAL DATA:  Myocardial infarction.  EXAM: PORTABLE CHEST - 1 VIEW  COMPARISON:  10/16/2013  FINDINGS: Right-sided PICC line is unchanged. Prior median sternotomy. Cardiomegaly accentuated by AP portable technique. Improved to resolved right pleural effusion. Small left pleural effusion remains. Numerous leads and wires project over the chest. No pneumothorax. Improved mild interstitial edema. Left greater than right bibasilar airspace disease is similar to minimally improved.  IMPRESSION: Improved aeration, with decreased interstitial edema and decreased to resolved right pleural effusion.  Patchy bibasilar airspace disease and small left pleural effusion remain.   Electronically Signed   By: Jeronimo Greaves M.D.   On: 10/17/2013 07:36   Dg Abd Acute W/chest  11/14/2013   CLINICAL DATA:  History of MI and CHF.  Hypertension.  Diarrhea.  EXAM: ACUTE  ABDOMEN SERIES (ABDOMEN 2 VIEW & CHEST 1 VIEW)  COMPARISON:  11/06/2013  FINDINGS: Normal bowel gas pattern. No free air. Abdominal soft tissues show mild aortic calcifications but are otherwise unremarkable.  Frontal chest radiograph shows changes from  prior CABG surgery. The lungs are clear.  IMPRESSION: No acute findings. No obstruction or free air. No acute cardiopulmonary disease on the chest radiograph.   Electronically Signed   By: Amie Portland M.D.   On: 11/14/2013 21:25    Microbiology: Recent Results (from the past 240 hour(s))  CLOSTRIDIUM DIFFICILE BY PCR     Status: None   Collection Time    11/15/13 10:17 AM      Result Value Range Status   C difficile by pcr NEGATIVE  NEGATIVE Final     Labs: Basic Metabolic Panel:  Recent Labs Lab 11/14/13 2009 11/15/13 0612  NA 136 138  K 3.7 3.9  CL 96 100  CO2 30 29  GLUCOSE 98 88  BUN 34* 32*  CREATININE 1.69* 1.46*  CALCIUM 8.8 8.5   Liver Function Tests:  Recent Labs Lab 11/14/13 2009  AST 31  ALT 46  ALKPHOS 105  BILITOT 0.8  PROT 6.0  ALBUMIN 2.9*   No results found for this basename: LIPASE, AMYLASE,  in the last 168 hours No results found for this basename: AMMONIA,  in the last 168 hours CBC:  Recent Labs Lab 11/14/13 2009 11/15/13 0612  WBC 6.5 5.3  NEUTROABS 4.1  --   HGB 9.2* 9.1*  HCT 29.2* 28.2*  MCV 91.8 90.1  PLT 355 326   Cardiac Enzymes:  Recent Labs Lab 11/14/13 2009  TROPONINI <0.30   BNP: BNP (last 3 results)  Recent Labs  10/07/13 0400 10/17/13 0415 11/14/13 2009  PROBNP 3092.0* 30734.0* 18488.0*   CBG: No results found for this basename: GLUCAP,  in the last 168 hours     Signed:  Meha Vidrine L  [facility] 11/15/2013, 2:44 PM

## 2013-11-15 NOTE — Progress Notes (Signed)
Advanced Heart Failure Rounding Note   Subjective:     No further diarrhea. Feels good eager to go home.    Objective:   Weight Range:  Vital Signs:   Temp:  [97.7 F (36.5 C)-98 F (36.7 C)] 97.8 F (36.6 C) (12/12 0537) Pulse Rate:  [82-92] 89 (12/12 0537) Resp:  [14-23] 21 (12/12 0537) BP: (105-131)/(54-74) 124/64 mmHg (12/12 0537) SpO2:  [94 %-100 %] 94 % (12/12 0537) Weight:  [100.971 kg (222 lb 9.6 oz)] 100.971 kg (222 lb 9.6 oz) (12/12 0012) Last BM Date: 11/15/13  Weight change: Filed Weights   11/15/13 0012  Weight: 100.971 kg (222 lb 9.6 oz)    Intake/Output:   Intake/Output Summary (Last 24 hours) at 11/15/13 0818 Last data filed at 11/15/13 0030  Gross per 24 hour  Intake      0 ml  Output    250 ml  Net   -250 ml     Physical Exam: General: in NAD, frail appearing  Head: Normocephalic, atraumatic, sclera non-icteric, no xanthomas, nares are without discharge.  Neck: Supple. Negative for carotid bruits. JVD not elevated.  Lungs: Clear bilaterally to auscultation without wheezes, rales, or rhonchi. Breathing is unlabored.  Heart: irreg No murmurs, rubs, or gallops appreciated.  Abdomen: Soft, non-tender, non-distended with increased active bowel sounds. No hepatomegaly. No rebound/guarding. No obvious abdominal masses.  Msk: Strength and tone appear normal for age.  Extremities: No clubbing or cyanosis. No edema. Distal pedal pulses are 2+ and equal bilaterally.  Neuro: Alert and oriented X 3. Moves all extremities spontaneously.  Psych: Responds to questions appropriately with a normal affect.    Labs: Basic Metabolic Panel:  Recent Labs Lab 11/14/13 2009 11/15/13 0612  NA 136 138  K 3.7 3.9  CL 96 100  CO2 30 29  GLUCOSE 98 88  BUN 34* 32*  CREATININE 1.69* 1.46*  CALCIUM 8.8 8.5    Liver Function Tests:  Recent Labs Lab 11/14/13 2009  AST 31  ALT 46  ALKPHOS 105  BILITOT 0.8  PROT 6.0  ALBUMIN 2.9*   No results  found for this basename: LIPASE, AMYLASE,  in the last 168 hours No results found for this basename: AMMONIA,  in the last 168 hours  CBC:  Recent Labs Lab 11/14/13 2009 11/15/13 0612  WBC 6.5 5.3  NEUTROABS 4.1  --   HGB 9.2* 9.1*  HCT 29.2* 28.2*  MCV 91.8 90.1  PLT 355 326    Cardiac Enzymes:  Recent Labs Lab 11/14/13 2009  TROPONINI <0.30    BNP: BNP (last 3 results)  Recent Labs  10/07/13 0400 10/17/13 0415 11/14/13 2009  PROBNP 3092.0* 30734.0* 18488.0*     Other results:  EKG:   Imaging: Dg Abd Acute W/chest  11/14/2013   CLINICAL DATA:  History of MI and CHF.  Hypertension.  Diarrhea.  EXAM: ACUTE ABDOMEN SERIES (ABDOMEN 2 VIEW & CHEST 1 VIEW)  COMPARISON:  11/06/2013  FINDINGS: Normal bowel gas pattern. No free air. Abdominal soft tissues show mild aortic calcifications but are otherwise unremarkable.  Frontal chest radiograph shows changes from prior CABG surgery. The lungs are clear.  IMPRESSION: No acute findings. No obstruction or free air. No acute cardiopulmonary disease on the chest radiograph.   Electronically Signed   By: Amie Portland M.D.   On: 11/14/2013 21:25      Medications:     Scheduled Medications: . aspirin EC  81 mg Oral Daily  .  atorvastatin  80 mg Oral q1800  . cholecalciferol  2,000 Units Oral Daily  . digoxin  0.125 mg Oral Daily  . enalapril  2.5 mg Oral BID  . metroNIDAZOLE  500 mg Oral Q8H  . multivitamin with minerals  1 tablet Oral Daily  . potassium chloride SA  20 mEq Oral BID  . Rivaroxaban  15 mg Oral BID WC  . sodium chloride  3 mL Intravenous Q12H     Infusions:     PRN Medications:  ALPRAZolam, ALPRAZolam, docusate sodium, feeding supplement (GLUCERNA SHAKE), nitroGLYCERIN, oxyCODONE   Assessment:   1. Diarrhea, resolved 2. CAD s/p recent cabg  3. 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 CKD, slightly worse, likely pre-renal  5.  Anemia  6. Parox afib, in sinus  7. H/o DVT, on xarelto   Plan/Discussion:     Diarrhea has resolved. No stool for c. Diff. May have viral gastroenteritis.   If no further BM by noon probably can go home.   HF very stable. Weight down 10 pounds from recent discharge. Would hold diuretics for 3 days and can resume torsemide 20 bid next week. Continue other HF meds.   We will reschedule HF Clinic visit for next week. We will sign off. Please call with questions.    Length of Stay: 1 Arvilla Meres  MD  11/15/2013, 8:18 AM  Advanced Heart Failure Team Pager [phone] (M-F; 7a - 4p)  Please contact [facility] for night-coverage after hours (4p -7a ) and weekends on [email or web address]

## 2013-11-15 NOTE — Progress Notes (Signed)
[facility]  Patient Status: Active (receiving services up to time of hospitalization)  [facility] is providing the following services: RN, PT, OT and HHA  If patient discharges after hours, please call [phone].   Antonio Bernard 11/15/2013, 10:33 AM

## 2013-11-15 NOTE — Progress Notes (Signed)
Patient was discharged home with daughter. Patient was given discharge paperwork and information on prescriptions. Patient was informed on diarrhea management. Patient was told to call MD with questions and concerns. Patient was stable upon discharge.

## 2013-11-15 NOTE — Progress Notes (Signed)
UR Completed.  Antonio Bernard [phone] 11/15/2013  

## 2013-11-19 ENCOUNTER — Telehealth (HOSPITAL_COMMUNITY): Payer: Self-pay | Admitting: Cardiology

## 2013-11-19 NOTE — Telephone Encounter (Signed)
Antonio Bernard called with concerns of decreased B/p sitting 118/62, standing 80/044 While standing also pt C/O blurred vision, weakness in legs, head feels HOT, denies nausea or chest pain Pt did HOLD Demadex 12/12-12/14 pt is down 5 lbs currently weighs 200 lbs     Per vo Dr. Gala Romney With pts hx of orthostatic hypotension, pt should HOLD all doses of Demadex until next scheduled appt 11/22/13   Antonio Bernard aware

## 2013-11-20 ENCOUNTER — Telehealth: Payer: Self-pay | Admitting: *Deleted

## 2013-11-20 NOTE — Telephone Encounter (Signed)
Faxed to [facility] signed home health certification and plan of care, plan of treatment

## 2013-11-20 NOTE — Telephone Encounter (Signed)
Faxed to [facility] signed PT/OT orders for patient.

## 2013-11-21 ENCOUNTER — Telehealth (HOSPITAL_COMMUNITY): Payer: Self-pay | Admitting: Cardiology

## 2013-11-21 NOTE — Telephone Encounter (Signed)
pts daughter called [facility] with concerns of hypotension pts b/p 12/17 90/60 SBP dropped into the 70's after walking around Today b/p 111/66 without any meds Pt is very symptomatic (dizziness and increased fatigue), should pt continue his b/p meds?    Please advise

## 2013-11-22 ENCOUNTER — Encounter (HOSPITAL_COMMUNITY): Payer: Self-pay

## 2013-11-22 ENCOUNTER — Telehealth: Payer: Self-pay | Admitting: *Deleted

## 2013-11-22 ENCOUNTER — Ambulatory Visit (HOSPITAL_COMMUNITY)
Admit: 2013-11-22 | Discharge: 2013-11-22 | Disposition: A | Discharge status: Home / Self Care | Payer: Medicare Other | Attending: Internal Medicine | Admitting: Internal Medicine

## 2013-11-22 VITALS — BP 112/78 | HR 89 | Resp 19 | Ht 73.0 in | Wt 223.8 lb

## 2013-11-22 DIAGNOSIS — I5022 Chronic systolic (congestive) heart failure: Secondary | ICD-10-CM

## 2013-11-22 DIAGNOSIS — F411 Generalized anxiety disorder: Secondary | ICD-10-CM | POA: Insufficient documentation

## 2013-11-22 DIAGNOSIS — I2510 Atherosclerotic heart disease of native coronary artery without angina pectoris: Secondary | ICD-10-CM

## 2013-11-22 DIAGNOSIS — I82409 Acute embolism and thrombosis of unspecified deep veins of unspecified lower extremity: Secondary | ICD-10-CM

## 2013-11-22 LAB — BASIC METABOLIC PANEL
BUN: 26 mg/dL — ABNORMAL HIGH (ref 6–23)
CO2: 27 mEq/L (ref 19–32)
Creatinine, Ser: 1.32 mg/dL (ref 0.50–1.35)
GFR calc Af Amer: 58 mL/min — ABNORMAL LOW (ref >90)
GFR calc non Af Amer: 50 mL/min — ABNORMAL LOW (ref >90)
Potassium: 4.8 mEq/L (ref 3.5–5.1)
Sodium: 136 mEq/L (ref 135–145)

## 2013-11-22 MED ORDER — CARVEDILOL 3.125 MG PO TABS: 3.1250 mg | ORAL_TABLET | Freq: Two times a day (BID) | ORAL | Status: DC

## 2013-11-22 MED ORDER — CITALOPRAM HYDROBROMIDE 20 MG PO TABS: 20.0000 mg | ORAL_TABLET | Freq: Every day | ORAL | Status: AC

## 2013-11-22 MED ORDER — POTASSIUM CHLORIDE CRYS ER 20 MEQ PO TBCR: 20.0000 meq | EXTENDED_RELEASE_TABLET | ORAL | Status: DC | PRN

## 2013-11-22 MED ORDER — TORSEMIDE 20 MG PO TABS: 20.0000 mg | ORAL_TABLET | ORAL | Status: DC | PRN

## 2013-11-22 NOTE — Patient Instructions (Signed)
Stop enalapril  Stop Spironolactone  Take toresmide only if your weight is 225 pounds or greater with 20 meq of potassium   Take carvedilol 3.125 mg twice a day only if your systolic blood pressure which is the top number is greater than 95  Take celexa 20 mg daily   Follow up in 2 weeks

## 2013-11-22 NOTE — Telephone Encounter (Signed)
Faxed to Dr. Gala Romney at [phone] paperwork from [facility] (documentation of face-to-face encounter and PT orders) - this provider is currently treating this patient

## 2013-11-22 NOTE — Progress Notes (Signed)
Patient ID: Antonio Bernard, male   DOB: 03-03-1933, 79 y.o.   MRN: [id number]  Weight Range   Baseline proBNP     HPI: Antonio Bernard is a 79 yo male with a hx of HTN, severe anxiety, OSA , systolic HF due to ICM EF 25% 78/29/5621, S/P CABG x 2 LIMA-LAD; SVG-DIAG on 10/08/13.   On 10/05/2013 had late presentation with anterior MI. Underwent emergent LAD PCI followed by emergent CABG. Postoperative course was complicated by shock and cardiorenal syndrome which required lasix drip, milrinone and dopamine. His post operative course was also complicated by DVT in his R peroneal vein. As he improved all drips were weaned off and he was transferred to CIR on 10/24/13. He was later transferred back to acute care with volme overload and low output. Placed back on Milrinone and lasix drip. Later this was weaned off. Course also c/b orthostatic hypotension. EF 25%. Discharged home with LifeVest.  He was readmitted 11/14/13 and discharged the next day after being treated for diarrhea. Negative for Cdiff. Given immodium. Diuretics were held. Diuretics restarted 11/18/13 but later stopped 11/19/13.    He returns for post hospital follow up. Complaining of constipation. Has not had bowel movement since Saturday. Yesterday enalapril, spiro,  and torsemide stopped due to low blood pressure. Weight at home has 231 down to 219 pounds. He has been orthostatic at home. Denies SOB/PND/Orthopnea. Wears lifevest. Wants to stop lifevest. Followed by [facility].   Labs 11/15/13 K 3.9 Creatinine 1.46   ROS: All systems negative except as listed in HPI, PMH and Problem List.  Past Medical History  Diagnosis Date  . Acid reflux   . Family history of anesthesia complication     daughter had problem waking up  . Pneumonia     hx of  . Sleep apnea     no treatment for, last sleep study over 5 years ago  . Abdominal aortic aneurysm     found 06/29/12 "small"  . Headache(784.0)   . Herniated vertebral disc     thoracic area   . Cataracts, bilateral     hx of  . H/O Legionnaire's disease     about 24 years ago  . Anxiety     panic attacks   . Claustrophobia     "severe"  . Hypertension     sees  Dr. Ronne Binning [phone]  . Obesity (BMI 35.0-39.9 without comorbidity) 10/06/2013  . Essential hypertension 10/06/2013  . Anginal pain   . Atrial fibrillation     Postoperative  . Acute renal insufficiency     Postoperative  . Anemia associated with acute blood loss     Postoperative    Current Outpatient Prescriptions  Medication Sig Dispense Refill  . acetaminophen (TYLENOL) 325 MG tablet Take 2 tablets (650 mg total) by mouth every 6 (six) hours as needed for mild pain, fever or headache.      . ALPRAZolam (XANAX) 0.25 MG tablet Take 1 tablet (0.25 mg total) by mouth 3 (three) times daily as needed for anxiety.  90 tablet  0  . ALPRAZolam (XANAX) 1 MG tablet Take 1 tablet (1 mg total) by mouth at bedtime as needed for anxiety.  30 tablet  0  . Ascorbic Acid (VITAMIN C) 1000 MG tablet Take 1,000 mg by mouth daily.      Marland Kitchen aspirin EC 81 MG tablet Take 81 mg by mouth daily.      Marland Kitchen atorvastatin (LIPITOR) 80 MG tablet Take 1 tablet (  80 mg total) by mouth daily at 6 PM.  30 tablet  6  . cholecalciferol (VITAMIN D) 1000 UNITS tablet Take 2,000 Units by mouth daily.      . digoxin (LANOXIN) 0.125 MG tablet Take 1 tablet (0.125 mg total) by mouth daily.  30 tablet  6  . feeding supplement, GLUCERNA SHAKE, (GLUCERNA SHAKE) LIQD Take 237 mLs by mouth 3 (three) times daily between meals as needed (meal replacement).      Marland Kitchen loperamide (IMODIUM) 2 MG capsule Take 1 capsule (2 mg total) by mouth as needed for diarrhea or loose stools (maximum 6 doses per day).  30 capsule  0  . Multiple Vitamin (MULTIVITAMIN WITH MINERALS) TABS tablet Take 1 tablet by mouth daily.      . nitroGLYCERIN (NITROSTAT) 0.3 MG SL tablet Place 1 tablet (0.3 mg total) under the tongue every 5 (five) minutes as needed for chest pain.  90 tablet  12   . oxyCODONE (OXY IR/ROXICODONE) 5 MG immediate release tablet Take 1 tablet (5 mg total) by mouth every 6 (six) hours as needed for moderate pain.  30 tablet  0  . potassium chloride SA (K-DUR,KLOR-CON) 20 MEQ tablet Take 1 tablet (20 mEq total) by mouth 2 (two) times daily.  60 tablet  6  . Rivaroxaban (XARELTO) 20 MG TABS tablet Take 1 tablet (20 mg total) by mouth daily with supper.  30 tablet  6  . enalapril (VASOTEC) 2.5 MG tablet Take 1 tablet (2.5 mg total) by mouth 2 (two) times daily.  60 tablet  6  . spironolactone (ALDACTONE) 25 MG tablet Take 1 tablet (25 mg total) by mouth daily.  30 tablet  6  . torsemide (DEMADEX) 20 MG tablet Take 1 tablet (20 mg total) by mouth 2 (two) times daily.  60 tablet  6   No current facility-administered medications for this encounter.     PHYSICAL EXAM: Filed Vitals:   11/22/13 0937  BP: 112/78  Pulse: 89  Resp: 19  Height: 6\' 1"  (1.854 m)  Weight: 223 lb 12.8 oz (101.515 kg)  SpO2: 99%    General:  Weak appearing. No resp difficulty. Sitting in wheelchair. Daughter present   HEENT: normal Neck: supple. JVP flat. Carotids 2+ bilaterally; no bruits. No lymphadenopathy or thryomegaly appreciated. Cor: PMI normal. Regular rate & rhythm. No rubs, gallops or murmurs.. Sternal incision healed.  Lifevest on  Lungs: clear Abdomen: soft, nontender, nondistended. No hepatosplenomegaly. No bruits or masses. Good bowel sounds. Extremities: no cyanosis, clubbing, rash, edema Neuro: alert & orientedx3, cranial nerves grossly intact. Moves all 4 extremities w/o difficulty. Affect pleasant.      ASSESSMENT & PLAN: 1.Chronic Systolic Heart Failure 10/2013 EF 25%. NYHA II He has struggled with his volume status and blood pressure. Volume status currently low. Continue to hold torsemide. Instructed to take torsemide 20 mg as needed if weight is 225 pounds.BP at home low will keep off enalapril and spironolactone.  Add 3.125 mg carvedilol twice a day.  Only take if SBP is greater than 95. He wants to stop Lifevest. He understands the risks.  Will need repeat ECHO late February after HF meds optimized.  Discussed limiting fluid to < 2 liters per day.   2) CAD with recent anterior STEMI -S/P CABG x 2 10/08/13 LIMA-LAD; SVG-DIAG on 10/08/13 . No evidence of ischemia. Continue statin.   3) Severe Anxiety - on chronic Xanax 3-4 times a day at home . Seems also to have situational  depression post-MI/CABG.. Add celexa 20 mg daily.     4) RLE DVT -- Continue Xarelto 20 mg daily   Follow up in 2 weeks.  CLEGG,AMY NP-C  11:29 AM  Patient seen and examined with Tonye Becket, NP. We discussed all aspects of the encounter. I agree with the assessment and plan as stated above.   Improving slowly but still tenuous. Volume status low. Long talk about how to manage diuretics. Will use only prn based on slidiing scale. Agree with hodling spiro and ACE due to low BP. Will try low-dose carvedilol. Agree with SSRI. Continue Xarelto for DVT. Will d/c LifeVest at his request. Refer to cardiac rehab when ready.   Sennie Borden,MD 12:16 PM

## 2013-11-23 ENCOUNTER — Telehealth: Payer: Self-pay | Admitting: Physician Assistant

## 2013-11-23 NOTE — Telephone Encounter (Signed)
Enrique Sack, PT called answering service back to return my call (returning her call). Called to clarify instruction on the Coreg. The patient's family was under the impression to hold the Coreg if SBP>95 but didn't think that made sense. I affirmed the correct directions of ONLY TAKE if SBP>95. She verbalized understanding and will convey this to the patient and family. She also reports that he had a few orthostatic readings last week but the patient has been seen in office with med changes since that time. Advised her to contact us if he has recurrence of orthostatic hypotension. She verbalized understanding of plan. Dayna Dunn PA-C

## 2013-11-23 NOTE — Telephone Encounter (Signed)
Received page to answering service to call Enrique Sack, PT about patient. Returned call but got voicemail. Left message on her machine with my direct pager to call me back. Dayna Dunn PA-C

## 2013-11-26 ENCOUNTER — Other Ambulatory Visit: Payer: Self-pay | Admitting: *Deleted

## 2013-11-26 ENCOUNTER — Telehealth (HOSPITAL_COMMUNITY): Payer: Self-pay | Admitting: Cardiology

## 2013-11-26 DIAGNOSIS — I2510 Atherosclerotic heart disease of native coronary artery without angina pectoris: Secondary | ICD-10-CM

## 2013-11-26 NOTE — Telephone Encounter (Signed)
Daughter has multiple questions regarding meds, blood thinners and Coreg. Please advise

## 2013-11-26 NOTE — Telephone Encounter (Signed)
Spoke w/pt's daughter she is still concerned about BP dropping, she states if he doesn't take carvedilol it wil drop from like 120-110s to only 90s but when he takes the carvedilol its dropping down to low 80s and he is very symptomatic and she is concerned about him falling, discussed w/Amy Filbert Schilder, NP will have pt stop carvedilol for now.  She also states pt has seen commercials about Xarelto causing problems, advised he does need blood thinner as he has a DVT and all will have same risk of bleeding, she is agreeable for him to continue

## 2013-12-04 ENCOUNTER — Ambulatory Visit (INDEPENDENT_AMBULATORY_CARE_PROVIDER_SITE_OTHER): Payer: PRIVATE HEALTH INSURANCE | Admitting: Cardiothoracic Surgery

## 2013-12-04 ENCOUNTER — Ambulatory Visit
Admission: RE | Admit: 2013-12-04 | Discharge: 2013-12-04 | Disposition: A | Discharge status: Home / Self Care | Payer: Medicare Other | Source: Ambulatory Visit | Attending: Cardiothoracic Surgery | Admitting: Cardiothoracic Surgery

## 2013-12-04 VITALS — BP 117/71 | HR 87 | Resp 16 | Ht 73.0 in | Wt 223.0 lb

## 2013-12-04 DIAGNOSIS — I2510 Atherosclerotic heart disease of native coronary artery without angina pectoris: Secondary | ICD-10-CM

## 2013-12-04 DIAGNOSIS — Z951 Presence of aortocoronary bypass graft: Secondary | ICD-10-CM

## 2013-12-04 NOTE — Progress Notes (Signed)
PCP is Thayer Headings, MD Referring Provider is Marykay Lex, MD  Chief Complaint  Patient presents with  . Routine Post Op    f/u CABG x 2 10/08/13 with cxr    HPI: 79 year old patient presents for followup after CABG after severe anterior MI with CPK-MB > 400. Now with low EF followed at heart failure clinic. Able to ambulate with a walker now several 100 feet. Edema has resolved on oral Demadex. Maintaining sinus rhythm. Currently having problems with constipation, orthostatic dizziness (better after Corag held) and myalgia secondary to Lipitor. Patient living daughter. Patient not yet driving. Home health physical therapy and nursing are in place.  Patient appears much stronger without edema and chest x-ray is clear. Patient should be ready for outpatient cardiac rehabilitation at the hospital in the near future. He is able to drive now.   Past Medical History  Diagnosis Date  . Acid reflux   . Family history of anesthesia complication     daughter had problem waking up  . Pneumonia     hx of  . Sleep apnea     no treatment for, last sleep study over 5 years ago  . Abdominal aortic aneurysm     found 06/29/12 "small"  . Headache(784.0)   . Herniated vertebral disc     thoracic area  . Cataracts, bilateral     hx of  . H/O Legionnaire's disease     about 24 years ago  . Anxiety     panic attacks   . Claustrophobia     "severe"  . Hypertension     sees  Dr. Ronne Binning [phone]  . Obesity (BMI 35.0-39.9 without comorbidity) 10/06/2013  . Essential hypertension 10/06/2013  . Anginal pain   . Atrial fibrillation     Postoperative  . Acute renal insufficiency     Postoperative  . Anemia associated with acute blood loss     Postoperative    Past Surgical History  Procedure Laterality Date  . Shoulder surgery      left  . Cataract extraction w/ intraocular lens implant      bilateral  . Lumbar laminectomy/decompression microdiscectomy  07/20/2012    Procedure:  LUMBAR LAMINECTOMY/DECOMPRESSION MICRODISCECTOMY 1 LEVEL;  Surgeon: Reinaldo Meeker, MD;  Location: [facility];  Service: Neurosurgery;  Laterality: N/A;  lumbar four-five left  . Back surgery    . Cardiac catheterization  10/05/2013  . Eye surgery    . Coronary artery bypass graft N/A 10/08/2013    Procedure: CORONARY ARTERY BYPASS GRAFTING (CABG);  Surgeon: Kerin Perna, MD;  Location: [facility] OR;  Service: Open Heart Surgery;  Laterality: N/A;  Times 2 using left internal mammary artery and endoscopically harvested left saphenous vein  . Intraoperative transesophageal echocardiogram N/A 10/08/2013    Procedure: INTRAOPERATIVE TRANSESOPHAGEAL ECHOCARDIOGRAM;  Surgeon: Kerin Perna, MD;  Location: [facility] OR;  Service: Open Heart Surgery;  Laterality: N/A;    No family history on file.  Social History History  Substance Use Topics  . Smoking status: Former Smoker -- 0.50 packs/day    Types: Cigarettes, Cigars  . Smokeless tobacco: Never Used     Comment: 50 years ago  . Alcohol Use: No    Current Outpatient Prescriptions  Medication Sig Dispense Refill  . acetaminophen (TYLENOL) 325 MG tablet Take 2 tablets (650 mg total) by mouth every 6 (six) hours as needed for mild pain, fever or headache.      . ALPRAZolam (XANAX) 0.25 MG  tablet Take 1 tablet (0.25 mg total) by mouth 3 (three) times daily as needed for anxiety.  90 tablet  0  . ALPRAZolam (XANAX) 1 MG tablet Take 1 tablet (1 mg total) by mouth at bedtime as needed for anxiety.  30 tablet  0  . Ascorbic Acid (VITAMIN C) 1000 MG tablet Take 1,000 mg by mouth daily.      Marland Kitchen aspirin EC 81 MG tablet Take 81 mg by mouth daily.      Marland Kitchen atorvastatin (LIPITOR) 80 MG tablet Take 1 tablet (80 mg total) by mouth daily at 6 PM.  30 tablet  6  . cholecalciferol (VITAMIN D) 1000 UNITS tablet Take 2,000 Units by mouth daily.      . citalopram (CELEXA) 20 MG tablet Take 1 tablet (20 mg total) by mouth daily.  30 tablet  6  . digoxin (LANOXIN) 0.125 MG  tablet Take 1 tablet (0.125 mg total) by mouth daily.  30 tablet  6  . feeding supplement, GLUCERNA SHAKE, (GLUCERNA SHAKE) LIQD Take 237 mLs by mouth 3 (three) times daily between meals as needed (meal replacement).      Marland Kitchen loperamide (IMODIUM) 2 MG capsule Take 1 capsule (2 mg total) by mouth as needed for diarrhea or loose stools (maximum 6 doses per day).  30 capsule  0  . Multiple Vitamin (MULTIVITAMIN WITH MINERALS) TABS tablet Take 1 tablet by mouth daily.      . potassium chloride SA (K-DUR,KLOR-CON) 20 MEQ tablet Take 1 tablet (20 mEq total) by mouth as needed. Only if he take torsemide  60 tablet  6  . Rivaroxaban (XARELTO) 20 MG TABS tablet Take 1 tablet (20 mg total) by mouth daily with supper.  30 tablet  6  . torsemide (DEMADEX) 20 MG tablet Take 1 tablet (20 mg total) by mouth as needed. Only if weight at home is 225 pounds or greater  30 tablet  6  . nitroGLYCERIN (NITROSTAT) 0.3 MG SL tablet Place 1 tablet (0.3 mg total) under the tongue every 5 (five) minutes as needed for chest pain.  90 tablet  12   No current facility-administered medications for this visit.    Allergies  Allergen Reactions  . Demerol [Meperidine] Other (See Comments)    "makes me crazy"  . Lisinopril Diarrhea  . Shellfish Allergy Rash    Review of Systems no angina, incisions healing well, no fever  BP 117/71  Pulse 87  Resp 16  Ht 6\' 1"  (1.854 m)  Wt 223 lb (101.152 kg)  BMI 29.43 kg/m2  SpO2 97% Physical Exam Alert and comfortable Sternum well-healed Heart rhythm regular murmur No pedal edema Leg incision well-healed  Neuro intact  Diagnostic Tests: Chest x-ray without pleural effusion or edema  Impression: Slow progress but improvement now almost 2 months after emergency CABG for completed anterior MI  Plan: Medical followup in advanced heart failure clinic, return here as needed

## 2013-12-09 ENCOUNTER — Ambulatory Visit (HOSPITAL_COMMUNITY)
Admission: RE | Admit: 2013-12-09 | Discharge: 2013-12-09 | Disposition: A | Discharge status: Home / Self Care | Payer: Medicare Other | Source: Ambulatory Visit | Attending: Internal Medicine | Admitting: Internal Medicine

## 2013-12-09 VITALS — BP 143/77 | HR 92 | Resp 16 | Wt 228.2 lb

## 2013-12-09 DIAGNOSIS — F411 Generalized anxiety disorder: Secondary | ICD-10-CM | POA: Insufficient documentation

## 2013-12-09 DIAGNOSIS — R5381 Other malaise: Secondary | ICD-10-CM

## 2013-12-09 DIAGNOSIS — I5022 Chronic systolic (congestive) heart failure: Secondary | ICD-10-CM

## 2013-12-09 DIAGNOSIS — I2510 Atherosclerotic heart disease of native coronary artery without angina pectoris: Secondary | ICD-10-CM

## 2013-12-09 NOTE — Patient Instructions (Signed)
You have been referred to Physical Therapy  We will contact you in 6 weeks to schedule your next appointment.

## 2013-12-09 NOTE — Addendum Note (Signed)
Encounter addended by: Scarlette Calico, RN on: 12/09/2013 12:19 PM<BR>     Documentation filed: Patient Instructions Section, Orders

## 2013-12-09 NOTE — Progress Notes (Signed)
Patient ID: Antonio Bernard, male   DOB: 1933/06/09, 80 y.o.   MRN: [id number]  Weight Range   Baseline proBNP     HPI: [name] is a 80 yo male with a hx of HTN, severe anxiety, OSA , systolic HF due to ICM EF 25% 10/15/2013, S/P CABG x 2 LIMA-LAD; SVG-DIAG on 10/08/13.   On 10/05/2013 had late presentation with anterior MI. Underwent emergent LAD PCI followed by emergent CABG. Postoperative course was complicated by shock and cardiorenal syndrome which required lasix drip, milrinone and dopamine. His post operative course was also complicated by DVT in his R peroneal vein. As he improved all drips were weaned off and he was transferred to CIR on 10/24/13. He was later transferred back to acute care with volme overload and low output. Placed back on Milrinone and lasix drip. Later this was weaned off. Course also c/b orthostatic hypotension. EF 25%. Discharged home with LifeVest.  He was readmitted 11/14/13 and discharged the next day after being treated for diarrhea. Negative for Cdiff. Given immodium. Diuretics were held. Diuretics restarted 11/18/13 but later stopped 11/19/13.    We saw him in mid-December for post-hospital f/u./ Weight was down from 231 to 219. He was orthostatic. So we instructed him to take torsemide only for weight of 225 or greater. He has not hit that yet so hasn't taken any. Weight up about 4 pounds over the past 2 weeks. But he notes appetite is improving slwoly and he is eating more. Dizziness improved but still with occasional spells. He called the on-call line last week for SBP on 70s and carvedilol was stopped which has helped a lot. Starting to be more functional and walk with walker. Lipitor stopped due to myalgias. Lifevest stopped at his request last visit.  Followed by [facility]. PT apparently ended last week but therapist felt he needed 2 more works before starting CR.   They are taking BPs regularly (SBP now running 105-115 was in 80s while on carvedilol). Also  off enalapril due to low BPs. No bleeding with Xarelto.   Labs 11/15/13 K 3.9 Creatinine 1.46   ROS: All systems negative except as listed in HPI, PMH and Problem List.  Past Medical History  Diagnosis Date  . Acid reflux   . Family history of anesthesia complication     daughter had problem waking up  . Pneumonia     hx of  . Sleep apnea     no treatment for, last sleep study over 5 years ago  . Abdominal aortic aneurysm     found 06/29/12 "small"  . Headache(784.0)   . Herniated vertebral disc     thoracic area  . Cataracts, bilateral     hx of  . H/O Legionnaire's disease     about 24 years ago  . Anxiety     panic attacks   . Claustrophobia     "severe"  . Hypertension     sees  Dr. Alyson Ingles [phone]  . Obesity (BMI 35.0-39.9 without comorbidity) 10/06/2013  . Essential hypertension 10/06/2013  . Anginal pain   . Atrial fibrillation     Postoperative  . Acute renal insufficiency     Postoperative  . Anemia associated with acute blood loss     Postoperative    Current Outpatient Prescriptions  Medication Sig Dispense Refill  . acetaminophen (TYLENOL) 325 MG tablet Take 2 tablets (650 mg total) by mouth every 6 (six) hours as needed for mild pain, fever or  headache.      . ALPRAZolam (XANAX) 0.25 MG tablet Take 1 tablet (0.25 mg total) by mouth 3 (three) times daily as needed for anxiety.  90 tablet  0  . ALPRAZolam (XANAX) 1 MG tablet Take 1 tablet (1 mg total) by mouth at bedtime as needed for anxiety.  30 tablet  0  . Ascorbic Acid (VITAMIN C) 1000 MG tablet Take 1,000 mg by mouth daily.      Marland Kitchen aspirin EC 81 MG tablet Take 81 mg by mouth daily.      . cholecalciferol (VITAMIN D) 1000 UNITS tablet Take 2,000 Units by mouth daily.      . citalopram (CELEXA) 20 MG tablet Take 1 tablet (20 mg total) by mouth daily.  30 tablet  6  . digoxin (LANOXIN) 0.125 MG tablet Take 1 tablet (0.125 mg total) by mouth daily.  30 tablet  6  . feeding supplement, GLUCERNA SHAKE,  (GLUCERNA SHAKE) LIQD Take 237 mLs by mouth 3 (three) times daily between meals as needed (meal replacement).      . Multiple Vitamin (MULTIVITAMIN WITH MINERALS) TABS tablet Take 1 tablet by mouth daily.      . nitroGLYCERIN (NITROSTAT) 0.3 MG SL tablet Place 1 tablet (0.3 mg total) under the tongue every 5 (five) minutes as needed for chest pain.  90 tablet  12  . potassium chloride SA (K-DUR,KLOR-CON) 20 MEQ tablet Take 1 tablet (20 mEq total) by mouth as needed. Only if he take torsemide  60 tablet  6  . Rivaroxaban (XARELTO) 20 MG TABS tablet Take 1 tablet (20 mg total) by mouth daily with supper.  30 tablet  6  . torsemide (DEMADEX) 20 MG tablet Take 1 tablet (20 mg total) by mouth as needed. Only if weight at home is 225 pounds or greater  30 tablet  6  . atorvastatin (LIPITOR) 80 MG tablet Take 1 tablet (80 mg total) by mouth daily at 6 PM.  30 tablet  6  . loperamide (IMODIUM) 2 MG capsule Take 1 capsule (2 mg total) by mouth as needed for diarrhea or loose stools (maximum 6 doses per day).  30 capsule  0   No current facility-administered medications for this encounter.     PHYSICAL EXAM: Filed Vitals:   12/09/13 1135  BP: 143/77  Pulse: 92  Resp: 16  Weight: 228 lb 4 oz (103.534 kg)  SpO2: 99%    General:  Walked to clinic with walker. Daughter present   HEENT: normal Neck: supple. JVP 7. Carotids 2+ bilaterally; no bruits. No lymphadenopathy or thryomegaly appreciated. Cor: PMI normal. Regular rate & rhythm. No rubs, gallops or murmurs.. Sternal incision healed.  Lifevest on  Lungs: clear Abdomen: soft, nontender, nondistended. No hepatosplenomegaly. No bruits or masses. Good bowel sounds. Extremities: no cyanosis, clubbing, rash, tr edema Neuro: alert & orientedx3, cranial nerves grossly intact. Moves all 4 extremities w/o difficulty. Affect pleasant.   ASSESSMENT & PLAN: 1.Chronic Systolic Heart Failure [id number] EF 25%. NYHA III -Overall improved but unfortunately  unable to tolerate any HF meds other than digoxin due to symptomatic hypotension. Currently off enalapril, spiro and carvedilol. Will hold for now and try to reinitiate as he improves. He may need low-dose midodrine to help support and reduce orthostatic hypotension. Will have him take torsemide as need for weight 226 or greater. He has stopped Lifevest. He understands the risks.  Will need repeat ECHO late February after HF meds optimized.  Discussed limiting fluid to <  2 liters per day.   2) CAD with recent anterior STEMI -S/P CABG x 2 10/08/13 LIMA-LAD; SVG-DIAG on 10/08/13 . No evidence of ischemia. Unfortunately cannot tolerate lipitor as well. We discussed trying pravachol but we will hold off for now. We will need to revisit this in the near future.   3) Severe Anxiety - on chronic Xanax 3-4 times a day at home . Seems also to have situational depression post-MI/CABG.. Add celexa 20 mg daily.     4) RLE DVT -- Continue Xarelto 20 mg daily   5) Debility/deconditioning - I think he needs several more weeks of PT before starting CR. Will re-order HHPT.    Leonora Gores,MD 12:01 PM

## 2013-12-17 ENCOUNTER — Telehealth (HOSPITAL_COMMUNITY): Payer: Self-pay | Admitting: Cardiology

## 2013-12-17 NOTE — Telephone Encounter (Signed)
Antonio Bernard called with concerns of increased SOB Pt is unable to lay flat anymore, sleeping on more pillows and raising the bed more to get comfortable Pt did have to take two doses of torsemide last week. Weight 225 Weight stable this week 222-223 Antonio Bernard would like to know if he should continue to take torsemide   Please advise

## 2013-12-18 ENCOUNTER — Ambulatory Visit (HOSPITAL_COMMUNITY)
Admission: RE | Admit: 2013-12-18 | Discharge: 2013-12-18 | Disposition: A | Discharge status: Home / Self Care | Payer: Medicare Other | Source: Ambulatory Visit | Attending: Internal Medicine | Admitting: Internal Medicine

## 2013-12-18 ENCOUNTER — Encounter (HOSPITAL_COMMUNITY): Payer: Self-pay

## 2013-12-18 ENCOUNTER — Ambulatory Visit (HOSPITAL_COMMUNITY)
Admission: RE | Admit: 2013-12-18 | Discharge: 2013-12-18 | Disposition: A | Discharge status: Home / Self Care | Payer: Medicare Other | Source: Ambulatory Visit | Attending: Adult Health | Admitting: Adult Health

## 2013-12-18 VITALS — BP 140/84 | HR 86 | Wt 226.8 lb

## 2013-12-18 DIAGNOSIS — G4733 Obstructive sleep apnea (adult) (pediatric): Secondary | ICD-10-CM | POA: Insufficient documentation

## 2013-12-18 DIAGNOSIS — Z951 Presence of aortocoronary bypass graft: Secondary | ICD-10-CM | POA: Diagnosis not present

## 2013-12-18 DIAGNOSIS — F411 Generalized anxiety disorder: Secondary | ICD-10-CM | POA: Insufficient documentation

## 2013-12-18 DIAGNOSIS — I714 Abdominal aortic aneurysm, without rupture, unspecified: Secondary | ICD-10-CM | POA: Insufficient documentation

## 2013-12-18 DIAGNOSIS — I10 Essential (primary) hypertension: Secondary | ICD-10-CM | POA: Insufficient documentation

## 2013-12-18 DIAGNOSIS — I2510 Atherosclerotic heart disease of native coronary artery without angina pectoris: Secondary | ICD-10-CM | POA: Diagnosis not present

## 2013-12-18 DIAGNOSIS — I5022 Chronic systolic (congestive) heart failure: Secondary | ICD-10-CM

## 2013-12-18 DIAGNOSIS — I2589 Other forms of chronic ischemic heart disease: Secondary | ICD-10-CM | POA: Insufficient documentation

## 2013-12-18 DIAGNOSIS — Z86718 Personal history of other venous thrombosis and embolism: Secondary | ICD-10-CM | POA: Insufficient documentation

## 2013-12-18 DIAGNOSIS — Z79899 Other long term (current) drug therapy: Secondary | ICD-10-CM | POA: Insufficient documentation

## 2013-12-18 DIAGNOSIS — I252 Old myocardial infarction: Secondary | ICD-10-CM | POA: Insufficient documentation

## 2013-12-18 DIAGNOSIS — R5381 Other malaise: Secondary | ICD-10-CM | POA: Insufficient documentation

## 2013-12-18 DIAGNOSIS — E669 Obesity, unspecified: Secondary | ICD-10-CM | POA: Insufficient documentation

## 2013-12-18 LAB — BASIC METABOLIC PANEL
BUN: 24 mg/dL — ABNORMAL HIGH (ref 6–23)
CHLORIDE: 99 meq/L (ref 96–112)
CO2: 30 meq/L (ref 19–32)
CREATININE: 1 mg/dL (ref 0.50–1.35)
Calcium: 9.2 mg/dL (ref 8.4–10.5)
GFR calc Af Amer: 81 mL/min — ABNORMAL LOW (ref >90)
GFR calc non Af Amer: 70 mL/min — ABNORMAL LOW (ref >90)
GLUCOSE: 100 mg/dL — AB (ref 70–99)
Potassium: 3.6 mEq/L — ABNORMAL LOW (ref 3.7–5.3)
Sodium: 140 mEq/L (ref 137–147)

## 2013-12-18 LAB — CBC
HCT: 33.4 % — ABNORMAL LOW (ref 39.0–52.0)
HEMOGLOBIN: 10.5 g/dL — AB (ref 13.0–17.0)
MCH: 28.7 pg (ref 26.0–34.0)
MCHC: 31.4 g/dL (ref 30.0–36.0)
MCV: 91.3 fL (ref 78.0–100.0)
PLATELETS: 407 10*3/uL — AB (ref 150–400)
RBC: 3.66 MIL/uL — AB (ref 4.22–5.81)
RDW: 15.8 % — ABNORMAL HIGH (ref 11.5–15.5)
WBC: 6.6 10*3/uL (ref 4.0–10.5)

## 2013-12-18 LAB — PRO B NATRIURETIC PEPTIDE: Pro B Natriuretic peptide (BNP): 11976 pg/mL — ABNORMAL HIGH (ref 0–450)

## 2013-12-18 NOTE — Progress Notes (Signed)
Patient ID: Antonio Bernard, male   DOB: [birth year], 80 y.o.   MRN: [id number]  Weight Range   Baseline proBNP     HPI: Antonio Bernard is a 80 yo male with a hx of HTN, severe anxiety, OSA , systolic HF due to ICM EF 25% 10/15/2013, S/P CABG x 2 LIMA-LAD; SVG-DIAG on 10/08/13.   On 10/05/2013 had late presentation with anterior MI. Underwent emergent LAD PCI followed by emergent CABG. Postoperative course was complicated by shock and cardiorenal syndrome which required lasix drip, milrinone and dopamine. His post operative course was also complicated by DVT in his R peroneal vein. As he improved all drips were weaned off and he was transferred to CIR on 10/24/13. He was later transferred back to acute care with volme overload and low output. Placed back on Milrinone and lasix drip. Later this was weaned off. Course also c/b orthostatic hypotension. EF 25%. Discharged home with LifeVest.  He was readmitted 11/14/13 and discharged the next day after being treated for diarrhea. Negative for Cdiff. Given immodium. Diuretics were held. Diuretics restarted 11/18/13 but later stopped 11/19/13.    Lifevest stopped in December at his request.   He returns for an acute work in due to  increased dyspnea. Last night he says he had an episode of SOB with exertion. + Orthopnea. Poor appetite. Denies fever. Weight at home 223-221 pounds. He took one extra torsemide last week due to increased dyspnea. Followed by [facility]. They are taking BPs regularly (SBP now running 107-133) was in 80s while on carvedilol). Also off enalapril due to low BPs. No bleeding with Xarelto.   Labs 11/15/13 K 3.9 Creatinine 1.46   ROS: All systems negative except as listed in HPI, PMH and Problem List.  Past Medical History  Diagnosis Date  . Acid reflux   . Family history of anesthesia complication     daughter had problem waking up  . Pneumonia     hx of  . Sleep apnea     no treatment for, last sleep study over 5 years ago  .  Abdominal aortic aneurysm     found 06/29/12 "small"  . Headache(784.0)   . Herniated vertebral disc     thoracic area  . Cataracts, bilateral     hx of  . H/O Legionnaire's disease     about 24 years ago  . Anxiety     panic attacks   . Claustrophobia     "severe"  . Hypertension     sees  Dr. Alyson Ingles [phone]  . Obesity (BMI 35.0-39.9 without comorbidity) 10/06/2013  . Essential hypertension 10/06/2013  . Anginal pain   . Atrial fibrillation     Postoperative  . Acute renal insufficiency     Postoperative  . Anemia associated with acute blood loss     Postoperative    Current Outpatient Prescriptions  Medication Sig Dispense Refill  . acetaminophen (TYLENOL) 325 MG tablet Take 2 tablets (650 mg total) by mouth every 6 (six) hours as needed for mild pain, fever or headache.      . ALPRAZolam (XANAX) 0.25 MG tablet Take 1 tablet (0.25 mg total) by mouth 3 (three) times daily as needed for anxiety.  90 tablet  0  . Ascorbic Acid (VITAMIN C) 1000 MG tablet Take 1,000 mg by mouth daily.      Marland Kitchen aspirin EC 81 MG tablet Take 81 mg by mouth daily.      . cholecalciferol (VITAMIN D) 1000 UNITS  tablet Take 2,000 Units by mouth daily.      . citalopram (CELEXA) 20 MG tablet Take 1 tablet (20 mg total) by mouth daily.  30 tablet  6  . digoxin (LANOXIN) 0.125 MG tablet Take 1 tablet (0.125 mg total) by mouth daily.  30 tablet  6  . feeding supplement, GLUCERNA SHAKE, (GLUCERNA SHAKE) LIQD Take 237 mLs by mouth 3 (three) times daily between meals as needed (meal replacement).      . Multiple Vitamin (MULTIVITAMIN WITH MINERALS) TABS tablet Take 1 tablet by mouth daily.      . nitroGLYCERIN (NITROSTAT) 0.3 MG SL tablet Place 1 tablet (0.3 mg total) under the tongue every 5 (five) minutes as needed for chest pain.  90 tablet  12  . potassium chloride SA (K-DUR,KLOR-CON) 20 MEQ tablet Take 1 tablet (20 mEq total) by mouth as needed. Only if he take torsemide  60 tablet  6  . Rivaroxaban  (XARELTO) 20 MG TABS tablet Take 1 tablet (20 mg total) by mouth daily with supper.  30 tablet  6  . torsemide (DEMADEX) 20 MG tablet Take 1 tablet (20 mg total) by mouth as needed. Only if weight at home is 225 pounds or greater  30 tablet  6   No current facility-administered medications for this encounter.     PHYSICAL EXAM: Filed Vitals:   12/18/13 1552  BP: 140/84  Pulse: 86  Weight: 226 lb 12.8 oz (102.876 kg)  SpO2: 94%    General:  Walked to clinic with walker. Daughter present   HEENT: normal Neck: supple. JVP 5-6. Carotids 2+ bilaterally; no bruits. No lymphadenopathy or thryomegaly appreciated. Cor: PMI normal. Regular rate & rhythm. No rubs, gallops or murmurs.. Sternal incision healed.  Lifevest on  Lungs: Diminished in LLL Abdomen: soft, nontender, mildly distended. No hepatosplenomegaly. No bruits or masses. Good bowel sounds. Extremities: no cyanosis, clubbing, rash, edema Neuro: alert & orientedx3, cranial nerves grossly intact. Moves all 4 extremities w/o difficulty. Affect pleasant.   ASSESSMENT & PLAN: 1.Chronic Systolic Heart Failure 17/7939 EF 25%. NYHA III. Complaining of increased dyspnea with exertion and orthopnea.  Volume status mildly elevated. Instructed to take 20 mg torsemide tonight . He is not on BB or Ace due to hypotension. Does not have lifevest because he stopped in December. Needs repeat ECHO in December. Check CBC, BMET, Pro BNP  Check CXR today. May need to repeat ECHO   2) CAD with recent anterior STEMI -S/P CABG x 2 10/08/13 LIMA-LAD; SVG-DIAG on 10/08/13 . No evidence of ischemia. Unfortunately cannot tolerate lipitor as well.  3) Severe Anxiety - on chronic Xanax 3-4 times a day at home . Add celexa 20 mg daily.     4) RLE DVT -- Continue Xarelto 20 mg daily   5) Debility/deconditioning - HHPT.   Follow up in 2- 3 weeks  CLEGG,AMY,NP-C 3:58 PM  Patient seen and examined with [name], NP. We discussed all aspects of the  encounter. I agree with the assessment and plan as stated above. He is having increased HF symptoms despite relatively stable weights. ECG showed NSR with low voltage in limb leads so I did bedside echo to look for effusion and there was no effusion. EF seems to be improving 35-40%. Will increase diuretics and see if he responds. Will f/u next week.   Total time spent 45 minutes with over 2/3 of that time spent discussing above.   Vashon Arch,MD 4:21 PM

## 2013-12-18 NOTE — Telephone Encounter (Signed)
appt was sch for today

## 2013-12-18 NOTE — Patient Instructions (Signed)
Follow up in 2 weeks  Take 20 mg torsemide today  Do the following things EVERYDAY: 1) Weigh yourself in the morning before breakfast. Write it down and keep it in a log. 2) Take your medicines as prescribed 3) Eat low salt foods-Limit salt (sodium) to 2000 mg per day.  4) Stay as active as you can everyday 5) Limit all fluids for the day to less than 2 liters

## 2013-12-19 ENCOUNTER — Telehealth (HOSPITAL_COMMUNITY): Payer: Self-pay

## 2013-12-19 ENCOUNTER — Telehealth (HOSPITAL_COMMUNITY): Payer: Self-pay | Admitting: *Deleted

## 2013-12-19 NOTE — Telephone Encounter (Signed)
Patients daughter regarding labs and CXR done in clinic yesterday.  Informed of lab results, and slightly increased pleural effusions from CXR.  Per Amy's previous note, instructed to take torsemide 20mg  for the next 3 days with potassium.  Patient c/o feeling "wiped out" taking whole torsemide, instructed to split tab 1/2 am and 1/2 early pm.  Daughter agreeable and appreciative.  Told to call with further questions or concerns.  Reminded of upcoming clinic appointment at the end of this month. Renee Pain

## 2013-12-19 NOTE — Telephone Encounter (Signed)
Documented in other phone note. 

## 2013-12-19 NOTE — Telephone Encounter (Signed)
Message copied by Scarlette Calico on Thu Dec 19, 2013 11:53 AM ------      Message from: [place], [place] D      Created: Wed Dec 18, 2013  5:01 PM      Regarding: diuretics       Please ask Mr Lennox to take torsemide 20 mg daily for the next 3 days.             Please ask him to call back on Friday and let us know how he feels.             Thanks Amy ------

## 2013-12-26 ENCOUNTER — Telehealth (HOSPITAL_COMMUNITY): Payer: Self-pay | Admitting: *Deleted

## 2013-12-26 DIAGNOSIS — R0602 Shortness of breath: Secondary | ICD-10-CM

## 2013-12-26 DIAGNOSIS — J90 Pleural effusion, not elsewhere classified: Secondary | ICD-10-CM

## 2013-12-26 NOTE — Telephone Encounter (Signed)
Pt scheduled for Thoracentesis on Fri 1/23 at 10 am at [facility], pt's daughter is aware and will hold Xarelto tonight per Dr Haroldine Laws

## 2013-12-26 NOTE — Telephone Encounter (Signed)
Pt's daughter called concerned about pt's continued SOB, she states wt is the same maybe down 1 lb since last week, he did take Torsemide for several days but could not tell that it really helped his SOB, discussed w/Amy Clegg, NP who reviewed with Dr Haroldine Laws, feel pt should get Thoracentesis since he did have increase in pleural effusion on x-ray last week, will schedule and call pt's daughter back

## 2013-12-27 ENCOUNTER — Ambulatory Visit (HOSPITAL_COMMUNITY)
Admission: RE | Admit: 2013-12-27 | Discharge: 2013-12-27 | Disposition: A | Discharge status: Home / Self Care | Payer: Medicare Other | Source: Ambulatory Visit | Attending: Internal Medicine | Admitting: Internal Medicine

## 2013-12-27 ENCOUNTER — Ambulatory Visit (HOSPITAL_COMMUNITY)
Admission: RE | Admit: 2013-12-27 | Discharge: 2013-12-27 | Disposition: A | Discharge status: Home / Self Care | Payer: Medicare Other | Source: Ambulatory Visit | Attending: Radiology | Admitting: Radiology

## 2013-12-27 DIAGNOSIS — I2510 Atherosclerotic heart disease of native coronary artery without angina pectoris: Secondary | ICD-10-CM | POA: Insufficient documentation

## 2013-12-27 DIAGNOSIS — Z951 Presence of aortocoronary bypass graft: Secondary | ICD-10-CM | POA: Insufficient documentation

## 2013-12-27 DIAGNOSIS — R0602 Shortness of breath: Secondary | ICD-10-CM

## 2013-12-27 DIAGNOSIS — I509 Heart failure, unspecified: Secondary | ICD-10-CM | POA: Insufficient documentation

## 2013-12-27 DIAGNOSIS — J90 Pleural effusion, not elsewhere classified: Secondary | ICD-10-CM | POA: Insufficient documentation

## 2013-12-27 NOTE — Procedures (Signed)
Successful US guided left thoracentesis. Yielded 1.7L of clear yellow fluid. Pt tolerated procedure well. No immediate complications.  Specimen was not sent for labs. CXR ordered.  Ascencion Dike PA-C 12/27/2013 10:53 AM

## 2013-12-30 ENCOUNTER — Telehealth (HOSPITAL_COMMUNITY): Payer: Self-pay | Admitting: Cardiology

## 2013-12-30 NOTE — Telephone Encounter (Signed)
Pt was told by radiology he may need another thoracentesis on the opposite side. Please review x ray and let daughter-Denise know CXR results, if another procedure is needed family cannot bring him on Fri 01/03/14

## 2013-12-30 NOTE — Telephone Encounter (Signed)
Spoke w/pt's daughter, she states pt was feeling much better Sat and Sun after thoracentesis, however today his wt was up about a lb, little more SOB than yesterday and edema in ankles so she gave him a Torsemide, advised right pleural effusion was small, pt is sch to see [place] on Thur, if worse before then will call back

## 2014-01-02 ENCOUNTER — Ambulatory Visit (HOSPITAL_COMMUNITY)
Admission: RE | Admit: 2014-01-02 | Discharge: 2014-01-02 | Disposition: A | Discharge status: Home / Self Care | Payer: Medicare Other | Source: Ambulatory Visit | Attending: Adult Health | Admitting: Adult Health

## 2014-01-02 ENCOUNTER — Ambulatory Visit (HOSPITAL_COMMUNITY)
Admission: RE | Admit: 2014-01-02 | Discharge: 2014-01-02 | Disposition: A | Discharge status: Home / Self Care | Payer: PRIVATE HEALTH INSURANCE | Source: Ambulatory Visit | Attending: Internal Medicine | Admitting: Internal Medicine

## 2014-01-02 VITALS — BP 140/72 | HR 88 | Wt 223.0 lb

## 2014-01-02 DIAGNOSIS — I2510 Atherosclerotic heart disease of native coronary artery without angina pectoris: Secondary | ICD-10-CM

## 2014-01-02 DIAGNOSIS — R0989 Other specified symptoms and signs involving the circulatory and respiratory systems: Secondary | ICD-10-CM | POA: Insufficient documentation

## 2014-01-02 DIAGNOSIS — J90 Pleural effusion, not elsewhere classified: Secondary | ICD-10-CM

## 2014-01-02 DIAGNOSIS — R0600 Dyspnea, unspecified: Secondary | ICD-10-CM

## 2014-01-02 DIAGNOSIS — I82409 Acute embolism and thrombosis of unspecified deep veins of unspecified lower extremity: Secondary | ICD-10-CM | POA: Insufficient documentation

## 2014-01-02 DIAGNOSIS — R0609 Other forms of dyspnea: Secondary | ICD-10-CM | POA: Insufficient documentation

## 2014-01-02 DIAGNOSIS — F411 Generalized anxiety disorder: Secondary | ICD-10-CM | POA: Insufficient documentation

## 2014-01-02 DIAGNOSIS — I5022 Chronic systolic (congestive) heart failure: Secondary | ICD-10-CM

## 2014-01-02 MED ORDER — TORSEMIDE 20 MG PO TABS: 20.0000 mg | ORAL_TABLET | ORAL | Status: DC

## 2014-01-02 MED ORDER — POTASSIUM CHLORIDE CRYS ER 20 MEQ PO TBCR: 20.0000 meq | EXTENDED_RELEASE_TABLET | ORAL | Status: DC

## 2014-01-02 NOTE — Progress Notes (Signed)
Patient ID: Antonio Bernard, male   DOB: January 13, 1933, 80 y.o.   MRN: [id number]   Weight Range   Baseline proBNP     HPI: Antonio Bernard is a 80 yo male with a hx of HTN, severe anxiety, OSA , systolic HF due to ICM EF 25% 10/15/2013, S/P CABG x 2 LIMA-LAD; SVG-DIAG on 10/08/13.   On 10/05/2013 had late presentation with anterior MI. Underwent emergent LAD PCI followed by emergent CABG. Postoperative course was complicated by shock and cardiorenal syndrome which required lasix drip, milrinone and dopamine. His post operative course was also complicated by DVT in his R peroneal vein. As he improved all drips were weaned off and he was transferred to CIR on 10/24/13. He was later transferred back to acute care with volme overload and low output. Placed back on Milrinone and lasix drip. Later this was weaned off. Course also c/b orthostatic hypotension. EF 25%. Discharged home with LifeVest.  He was readmitted 11/14/13 and discharged the next day after being treated for diarrhea. Negative for Cdiff. Given immodium. Diuretics were held. Diuretics restarted 11/18/13 but later stopped 11/19/13.    Lifevest stopped in December at his request.   12/27/13 S/P Thoracentesis yielded 1.7 liters   He returns for follow.  On 12/27/13 he had a L Thoracentesis which yielded 1.7 liters. Initially he felt better but he is complaining of fatigue and increased dyspnea with exertion. + Orthopnea. Sleeping in a recliner over the last week. Weight at home 218 pounds. He has had torsemide about twice a week. No bleeding with Xarelto. Also off enalapril due to low BPs. Living at home alone.   Labs 11/15/13 K 3.9 Creatinine 1.46   ROS: All systems negative except as listed in HPI, PMH and Problem List.  Past Medical History  Diagnosis Date  . Acid reflux   . Family history of anesthesia complication     daughter had problem waking up  . Pneumonia     hx of  . Sleep apnea     no treatment for, last sleep study  over 5 years ago  . Abdominal aortic aneurysm     found 06/29/12 "small"  . Headache(784.0)   . Herniated vertebral disc     thoracic area  . Cataracts, bilateral     hx of  . H/O Legionnaire's disease     about 24 years ago  . Anxiety     panic attacks   . Claustrophobia     "severe"  . Hypertension     sees  Dr. Alyson Ingles [phone]  . Obesity (BMI 35.0-39.9 without comorbidity) 10/06/2013  . Essential hypertension 10/06/2013  . Anginal pain   . Atrial fibrillation     Postoperative  . Acute renal insufficiency     Postoperative  . Anemia associated with acute blood loss     Postoperative    Current Outpatient Prescriptions  Medication Sig Dispense Refill  . acetaminophen (TYLENOL) 325 MG tablet Take 2 tablets (650 mg total) by mouth every 6 (six) hours as needed for mild pain, fever or headache.      . ALPRAZolam (XANAX) 0.25 MG tablet Take 1 tablet (0.25 mg total) by mouth 3 (three) times daily as needed for anxiety.  90 tablet  0  . Ascorbic Acid (VITAMIN C) 1000 MG tablet Take 1,000 mg by mouth daily.      Marland Kitchen aspirin EC 81 MG tablet Take 81 mg by mouth daily.      . cholecalciferol (VITAMIN  D) 1000 UNITS tablet Take 2,000 Units by mouth daily.      . citalopram (CELEXA) 20 MG tablet Take 1 tablet (20 mg total) by mouth daily.  30 tablet  6  . digoxin (LANOXIN) 0.125 MG tablet Take 1 tablet (0.125 mg total) by mouth daily.  30 tablet  6  . feeding supplement, GLUCERNA SHAKE, (GLUCERNA SHAKE) LIQD Take 237 mLs by mouth 3 (three) times daily between meals as needed (meal replacement).      . Multiple Vitamin (MULTIVITAMIN WITH MINERALS) TABS tablet Take 1 tablet by mouth daily.      . nitroGLYCERIN (NITROSTAT) 0.3 MG SL tablet Place 1 tablet (0.3 mg total) under the tongue every 5 (five) minutes as needed for chest pain.  90 tablet  12  . potassium chloride SA (K-DUR,KLOR-CON) 20 MEQ tablet Take 1 tablet (20 mEq total) by mouth as needed. Only if he take torsemide  60 tablet  6   . Rivaroxaban (XARELTO) 20 MG TABS tablet Take 1 tablet (20 mg total) by mouth daily with supper.  30 tablet  6  . torsemide (DEMADEX) 20 MG tablet Take 1 tablet (20 mg total) by mouth as needed. Only if weight at home is 225 pounds or greater  30 tablet  6   No current facility-administered medications for this encounter.     PHYSICAL EXAM: Filed Vitals:   01/02/14 1502  BP: 140/72  Pulse: 88  Weight: 223 lb (101.152 kg)  SpO2: 96%    General:  Walked to clinic with walker. Dyspneic walking  Into clinic. Daughter present   HEENT: normal Neck: supple. JVP 5-6. Carotids 2+ bilaterally; no bruits. No lymphadenopathy or thryomegaly appreciated. Cor: PMI normal. Regular rate & rhythm. No rubs, gallops or murmurs.. Sternal incision healed.   Lungs: Diminished in LLL Abdomen: soft, nontender, mildly distended. No hepatosplenomegaly. No bruits or masses. Good bowel sounds. Extremities: no cyanosis, clubbing, rash, R and LLE 1-2+edema Neuro: alert & orientedx3, cranial nerves grossly intact. Moves all 4 extremities w/o difficulty. Affect pleasant.   ASSESSMENT & PLAN: 1.Chronic Systolic Heart Failure [id number] EF 25%. NYHA III. Had thoracentesis 12/27/13 which initially helped but he is complaining of increased dyspnea with exertion. Complaining of increased dyspnea with exertion and orthopnea.  Volume status mildly elevated. Increase torsemide 20 mg Mon-Wed-Fri and take potassium when takes torsemide.  He is not on BB or Ace due to hypotension.   2) CAD with recent anterior STEMI -S/P CABG x 2 10/08/13 LIMA-LAD; SVG-DIAG on 10/08/13 . No evidence of ischemia. Unfortunately cannot tolerate lipitor as well. Completed [facility] HHPT. Refer to cardiac rehab.   3) Severe Anxiety - on chronic Xanax 3-4 times a day at home . Add celexa 20 mg daily.   Needs to start cardiac rehab.   4) RLE DVT-- Continue Xarelto 20 mg daily   5) Debility/deconditioning - refer to cardiac rehab at [facility].   6) L Pleural  Effusion - S/P L thoracentesis 12/27/13. As above initially felt better but now with increased dyspnea on exertion. Check CXR today. If effusion has re-accumulated will ask Dr Darcey Nora to evaluate.   Follow up in 2- 3 weeks. Refer to cardiac rehab in [facility].   Antonio Bernard,AMY,NP-C 3:04 PM  Patient seen and examined with [name], NP. We discussed all aspects of the encounter. I agree with the assessment and plan as stated above. Overall he continues to improve but still with some dyspnea. Will repeat CXR to look for reaccumulation of effusion.  Will also increase diuretic as above. If effusion getting worse and not manageable with diuretics may need Pleurex catheter. Will refer to cardiac rehab.   CXR results reviewed - L effusion is beginning to re-accumulate after tap. Right remains small. Diuretics have been increased. Will repeat CXR next week. If effusion enlarging will discuss possible Pleurex cath with Dr. Prescott Gum.   Total time spent 50 minutes with more than 1/2 that time discussing above.   Benay Spice 4:32 PM

## 2014-01-02 NOTE — Patient Instructions (Addendum)
Follow up in 3 weeks  Take Torsemide 20 mg Monday Wednesday and Friday  Please take potassium when you take Torsemide   Do the following things EVERYDAY: 1) Weigh yourself in the morning before breakfast. Write it down and keep it in a log. 2) Take your medicines as prescribed 3) Eat low salt foods-Limit salt (sodium) to 2000 mg per day.  4) Stay as active as you can everyday 5) Limit all fluids for the day to less than 2 liters

## 2014-01-03 ENCOUNTER — Other Ambulatory Visit (HOSPITAL_COMMUNITY): Payer: Self-pay | Admitting: Adult Health

## 2014-01-03 ENCOUNTER — Telehealth (HOSPITAL_COMMUNITY): Payer: Self-pay | Admitting: Adult Health

## 2014-01-03 DIAGNOSIS — J90 Pleural effusion, not elsewhere classified: Secondary | ICD-10-CM

## 2014-01-03 NOTE — Telephone Encounter (Signed)
Provided with CXR results.  L pleural effusion slightly larger. Will repeat CXR next week. If increases next week will ask Dr Darcey Nora to evaluate.   Repeat CXR next week.   Langley Gauss verbalized understanding and will provide her Dad with results.   CLEGG,AMY NP-C 10:58 AM

## 2014-01-04 DIAGNOSIS — J90 Pleural effusion, not elsewhere classified: Secondary | ICD-10-CM | POA: Insufficient documentation

## 2014-01-06 ENCOUNTER — Other Ambulatory Visit: Payer: Self-pay | Admitting: *Deleted

## 2014-01-06 DIAGNOSIS — I509 Heart failure, unspecified: Secondary | ICD-10-CM

## 2014-01-08 ENCOUNTER — Encounter: Payer: Self-pay | Admitting: Cardiothoracic Surgery

## 2014-01-08 ENCOUNTER — Ambulatory Visit: Payer: Medicare Other | Admitting: Cardiothoracic Surgery

## 2014-01-08 ENCOUNTER — Ambulatory Visit (INDEPENDENT_AMBULATORY_CARE_PROVIDER_SITE_OTHER): Payer: PRIVATE HEALTH INSURANCE | Admitting: Cardiothoracic Surgery

## 2014-01-08 ENCOUNTER — Ambulatory Visit
Admission: RE | Admit: 2014-01-08 | Discharge: 2014-01-08 | Disposition: A | Discharge status: Home / Self Care | Payer: Medicare (Managed Care) | Source: Ambulatory Visit | Attending: Cardiothoracic Surgery | Admitting: Cardiothoracic Surgery

## 2014-01-08 VITALS — BP 131/77 | HR 87 | Resp 18 | Ht 73.0 in | Wt 221.6 lb

## 2014-01-08 DIAGNOSIS — J90 Pleural effusion, not elsewhere classified: Secondary | ICD-10-CM

## 2014-01-08 DIAGNOSIS — I509 Heart failure, unspecified: Secondary | ICD-10-CM

## 2014-01-08 NOTE — Progress Notes (Signed)
PCP is Thressa Sheller, MD Referring Provider is Leonie Man, MD  Chief Complaint  Patient presents with  . Pleural Effusion    bilateral ...left greater than right... eval for possible pleurX catheter    HPI: Patient examined, radiographic studies reviewed 80 year old male presents with left pleural effusion status post emergent CABG following a completed anterior MI. The patient has severe LV dysfunction with EF of 20-25% is followed in the heart failure clinic. He is slowly rehabilitating. He had postoperative atrial fibrillation and left DVT and is currently on oral anticoagulation. In late January he had a left thoracentesis which removed 1.7 L. He did not have a early postoperative left pleural effusion and the etiology of this effusion is probably from his systolic heart failure although postop delayed inflammatory effusion is a less likely possibility chest x-ray done today shows some reaccumulation of the left effusion since his thoracentesis. Patient states he is minimally symptomatic although his daughter takes is becoming more short of breath. He does take Demadex 3 days a week and his weight has been stable. 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  Past Medical History  Diagnosis Date  . Acid reflux   . Family history of anesthesia complication     daughter had problem waking up  . Pneumonia     hx of  . Sleep apnea     no treatment for, last sleep study over 5 years ago  . Abdominal aortic aneurysm     found 06/29/12 "small"  . Headache(784.0)   . Herniated vertebral disc     thoracic area  . Cataracts, bilateral     hx of  . H/O Legionnaire's disease     about 24 years ago  . Anxiety     panic attacks   . Claustrophobia     "severe"  . Hypertension     sees  Dr. Alyson Ingles [phone]  . Obesity (BMI 35.0-39.9 without comorbidity) 10/06/2013  . Essential hypertension 10/06/2013  . Anginal pain   . Atrial fibrillation     Postoperative  .  Acute renal insufficiency     Postoperative  . Anemia associated with acute blood loss     Postoperative    Past Surgical History  Procedure Laterality Date  . Shoulder surgery      left  . Cataract extraction w/ intraocular lens implant      bilateral  . Lumbar laminectomy/decompression microdiscectomy  07/20/2012    Procedure: LUMBAR LAMINECTOMY/DECOMPRESSION MICRODISCECTOMY 1 LEVEL;  Surgeon: Faythe Ghee, MD;  Location: [facility];  Service: Neurosurgery;  Laterality: N/A;  lumbar four-five left  . Back surgery    . Cardiac catheterization  10/05/2013  . Eye surgery    . Coronary artery bypass graft N/A 10/08/2013    Procedure: CORONARY ARTERY BYPASS GRAFTING (CABG);  Surgeon: Ivin Poot, MD;  Location: [place];  Service: Open Heart Surgery;  Laterality: N/A;  Times 2 using left internal mammary artery and endoscopically harvested left saphenous vein  . Intraoperative transesophageal echocardiogram N/A 10/08/2013    Procedure: INTRAOPERATIVE TRANSESOPHAGEAL ECHOCARDIOGRAM;  Surgeon: Ivin Poot, MD;  Location: [place];  Service: Open Heart Surgery;  Laterality: N/A;    No family history on file.  Social History History  Substance Use Topics  . Smoking status: Former Smoker -- 0.50 packs/day    Types: Cigarettes, Cigars  . Smokeless tobacco: Never Used     Comment: 50 years ago  . Alcohol Use:  No    Current Outpatient Prescriptions  Medication Sig Dispense Refill  . acetaminophen (TYLENOL) 325 MG tablet Take 2 tablets (650 mg total) by mouth every 6 (six) hours as needed for mild pain, fever or headache.      . ALPRAZolam (XANAX) 0.25 MG tablet Take 1 tablet (0.25 mg total) by mouth 3 (three) times daily as needed for anxiety.  90 tablet  0  . Ascorbic Acid (VITAMIN C) 1000 MG tablet Take 1,000 mg by mouth daily.      Marland Kitchen aspirin EC 81 MG tablet Take 81 mg by mouth daily.      . cholecalciferol (VITAMIN D) 1000 UNITS tablet Take 2,000 Units by mouth daily.      .  citalopram (CELEXA) 20 MG tablet Take 1 tablet (20 mg total) by mouth daily.  30 tablet  6  . digoxin (LANOXIN) 0.125 MG tablet Take 1 tablet (0.125 mg total) by mouth daily.  30 tablet  6  . feeding supplement, GLUCERNA SHAKE, (GLUCERNA SHAKE) LIQD Take 237 mLs by mouth 3 (three) times daily between meals as needed (meal replacement).      . Multiple Vitamin (MULTIVITAMIN WITH MINERALS) TABS tablet Take 1 tablet by mouth daily.      . nitroGLYCERIN (NITROSTAT) 0.3 MG SL tablet Place 1 tablet (0.3 mg total) under the tongue every 5 (five) minutes as needed for chest pain.  90 tablet  12  . potassium chloride SA (K-DUR,KLOR-CON) 20 MEQ tablet Take 1 tablet (20 mEq total) by mouth 3 (three) times a week. Only if he take torsemide  30 tablet  6  . Rivaroxaban (XARELTO) 20 MG TABS tablet Take 1 tablet (20 mg total) by mouth daily with supper.  30 tablet  6  . torsemide (DEMADEX) 20 MG tablet Take 1 tablet (20 mg total) by mouth 3 (three) times a week.  30 tablet  6   No current facility-administered medications for this visit.    Allergies  Allergen Reactions  . Demerol [Meperidine] Other (See Comments)    "makes me crazy"  . Lisinopril Diarrhea  . Shellfish Allergy Rash    Review of Systems poor appetite, needs more protein intake  BP 131/77  Pulse 87  Resp 18  Ht 6\' 1"  (1.854 m)  Wt 221 lb 9.6 oz (100.517 kg)  BMI 29.24 kg/m2  SpO2 93% Physical Exam Alert comfortable Lungs with diminished breath sounds at left base Heart rhythm regular without murmur Minimal edema  Diagnostic Tests: Chest x-ray shows left mild pleural effusion  slightly increased her last x-ray  Impression: I discussed placing a Pleurx catheter with the patient and he is not willing to accept this currently. I recommended to him certainly if he needs another thoracentesis done the Pleurx catheter should be performed. If he does get a second thoracentesis I would recommend short prednisone taper 50 mg down to 10  mg to reduce any inflammatory etiology of the effusion from post CABG.  Plan:Follow left effusion--if it recurs patient will probably consent to a a Pleurx catheter. I will see him back in 3-4 weeks with chest x-ray.

## 2014-01-09 ENCOUNTER — Other Ambulatory Visit: Payer: Self-pay | Admitting: *Deleted

## 2014-01-09 ENCOUNTER — Encounter (HOSPITAL_COMMUNITY): Payer: Self-pay | Admitting: Pharmacy Technician

## 2014-01-09 DIAGNOSIS — J90 Pleural effusion, not elsewhere classified: Secondary | ICD-10-CM

## 2014-01-13 ENCOUNTER — Encounter (HOSPITAL_COMMUNITY): Payer: Self-pay | Admitting: *Deleted

## 2014-01-13 MED ORDER — DEXTROSE 5 % IV SOLN
1.5000 g | INTRAVENOUS | Status: AC
  Administered 2014-01-14: 1.5 g via INTRAVENOUS
  Filled 2014-01-13: qty 1.5

## 2014-01-13 NOTE — Progress Notes (Signed)
Anesthesia Chart Review:  80 year old male scheduled for insertion of a left Pleurex catheter tomorrow by Dr. Prescott Gum.  He is scheduled to be a same day work-up.    He is s/p CABG (LIMA to LAd, SVG to diagonal) on 10/08/13 following acute anterior MI with LV dysfunction, emergent LAD angioplasty and balloon pump. He had post-operative afib, RLE (peroneal vein) DVT, acute kidney injury. He also has required thoracentesis in 12/2012 for left pleural effusion. Other history includes chronic systolic CHF, HTN, Legionnaire's disease > 20 years ago, anxiety with severe claustrophobia, OSA.  Xarelto was held for approximately 7 days pre-procedure.  PCP is Dr. Thressa Sheller. Dr. Lucianne Lei Trigt's note lists cardiologist as Dr. Ellyn Hack.  He is also followed [facility] by Dr. Haroldine Laws.   EKG on 12/18/13 showed NSR, possible LAE, right axis deviation, low voltage QRS, anteroseptal infarct, age undetermined, possible lateral infarct, age undetermined.  Echo on 10/24/13 showed: - Left ventricle: Basal function normal remainder of LV severely hypokinetic and septum and apex akinetic Cannot r/o LV apical thrombus The cavity size was severely dilated. Wall thickness was increased in a pattern of mild LVH. The estimated ejection fraction was 25%. - Left atrium: The atrium was moderately dilated. - Valve: Trivial MR, Mild PR/TR.  His last cath was on 10/06/13 prior to his CABG.  He is for CXR and labs on arrival.  It appears patient was referred to Dr. Prescott Gum by cardiology for need of Pluerex catheter due to recurrent left pleural effusion.  Case is posted for MAC.  He has already undergone recent CABG, however with severe LV dysfunction.  He did have recent follow-up at the CHF clinic.  Further evaluation on arrival.  If no acute CV/CHF symptoms and labs are acceptable that I would anticipate that he could proceed as planned.    [facility] Short Stay [facility]/Anesthesiology Phone [phone] 01/13/2014 10:50 AM

## 2014-01-13 NOTE — Progress Notes (Signed)
Spoke with pt. Daughter, Jenny Reichmann, informed that pt. Needs to report to short stay at 0530 for a 0800 surgery on 01/14/2014/

## 2014-01-13 NOTE — Progress Notes (Signed)
According to pt daughter Antonio Bernard, pt " stopped his Xarelto 5 days ago today." Pt was instructed to stop Xarelto but continue taking Aspirin. Pt is positive for Sleep Apnea but does not wear CPAP. Pt chart forwarded to [place], [place], (anesthesia) for review of abnormal EKG and cardiac history.

## 2014-01-14 ENCOUNTER — Encounter (HOSPITAL_COMMUNITY): Payer: Medicare Other | Admitting: Vascular Surgery

## 2014-01-14 ENCOUNTER — Ambulatory Visit (HOSPITAL_COMMUNITY): Payer: Medicare Other

## 2014-01-14 ENCOUNTER — Encounter (HOSPITAL_COMMUNITY)
Admission: RE | Disposition: A | Discharge status: Home / Self Care | Payer: Self-pay | Source: Ambulatory Visit | Attending: Cardiothoracic Surgery

## 2014-01-14 ENCOUNTER — Ambulatory Visit (HOSPITAL_COMMUNITY)
Admission: RE | Admit: 2014-01-14 | Discharge: 2014-01-14 | Disposition: A | Discharge status: Home / Self Care | Payer: Medicare Other | Source: Ambulatory Visit | Attending: Cardiothoracic Surgery | Admitting: Cardiothoracic Surgery

## 2014-01-14 ENCOUNTER — Encounter (HOSPITAL_COMMUNITY): Payer: Self-pay | Admitting: Certified Registered Nurse Anesthetist

## 2014-01-14 ENCOUNTER — Ambulatory Visit (HOSPITAL_COMMUNITY): Payer: Medicare Other | Admitting: Vascular Surgery

## 2014-01-14 DIAGNOSIS — I252 Old myocardial infarction: Secondary | ICD-10-CM | POA: Insufficient documentation

## 2014-01-14 DIAGNOSIS — Z87891 Personal history of nicotine dependence: Secondary | ICD-10-CM | POA: Insufficient documentation

## 2014-01-14 DIAGNOSIS — J90 Pleural effusion, not elsewhere classified: Secondary | ICD-10-CM

## 2014-01-14 DIAGNOSIS — D759 Disease of blood and blood-forming organs, unspecified: Secondary | ICD-10-CM | POA: Insufficient documentation

## 2014-01-14 DIAGNOSIS — Z86718 Personal history of other venous thrombosis and embolism: Secondary | ICD-10-CM | POA: Insufficient documentation

## 2014-01-14 DIAGNOSIS — I10 Essential (primary) hypertension: Secondary | ICD-10-CM | POA: Insufficient documentation

## 2014-01-14 DIAGNOSIS — Z951 Presence of aortocoronary bypass graft: Secondary | ICD-10-CM | POA: Insufficient documentation

## 2014-01-14 DIAGNOSIS — G4733 Obstructive sleep apnea (adult) (pediatric): Secondary | ICD-10-CM | POA: Insufficient documentation

## 2014-01-14 DIAGNOSIS — I509 Heart failure, unspecified: Secondary | ICD-10-CM | POA: Insufficient documentation

## 2014-01-14 DIAGNOSIS — Z7901 Long term (current) use of anticoagulants: Secondary | ICD-10-CM | POA: Insufficient documentation

## 2014-01-14 DIAGNOSIS — G4730 Sleep apnea, unspecified: Secondary | ICD-10-CM | POA: Insufficient documentation

## 2014-01-14 DIAGNOSIS — I5022 Chronic systolic (congestive) heart failure: Secondary | ICD-10-CM | POA: Insufficient documentation

## 2014-01-14 DIAGNOSIS — F411 Generalized anxiety disorder: Secondary | ICD-10-CM | POA: Insufficient documentation

## 2014-01-14 DIAGNOSIS — I4891 Unspecified atrial fibrillation: Secondary | ICD-10-CM | POA: Insufficient documentation

## 2014-01-14 HISTORY — PX: CHEST TUBE INSERTION: SHX231

## 2014-01-14 LAB — COMPREHENSIVE METABOLIC PANEL
ALT: 13 U/L (ref 0–53)
AST: 15 U/L (ref 0–37)
Albumin: 3.6 g/dL (ref 3.5–5.2)
Alkaline Phosphatase: 66 U/L (ref 39–117)
BUN: 26 mg/dL — ABNORMAL HIGH (ref 6–23)
CO2: 28 mEq/L (ref 19–32)
Calcium: 9.3 mg/dL (ref 8.4–10.5)
Chloride: 101 mEq/L (ref 96–112)
Creatinine, Ser: 1.09 mg/dL (ref 0.50–1.35)
GFR calc Af Amer: 73 mL/min — ABNORMAL LOW (ref >90)
GFR calc non Af Amer: 63 mL/min — ABNORMAL LOW (ref >90)
Glucose, Bld: 89 mg/dL (ref 70–99)
Potassium: 4.1 mEq/L (ref 3.7–5.3)
Sodium: 144 mEq/L (ref 137–147)
Total Bilirubin: 0.5 mg/dL (ref 0.3–1.2)
Total Protein: 6.8 g/dL (ref 6.0–8.3)

## 2014-01-14 LAB — CBC
HCT: 32.8 % — ABNORMAL LOW (ref 39.0–52.0)
Hemoglobin: 10.7 g/dL — ABNORMAL LOW (ref 13.0–17.0)
MCH: 29.2 pg (ref 26.0–34.0)
MCHC: 32.6 g/dL (ref 30.0–36.0)
MCV: 89.6 fL (ref 78.0–100.0)
Platelets: 347 10*3/uL (ref 150–400)
RBC: 3.66 MIL/uL — ABNORMAL LOW (ref 4.22–5.81)
RDW: 15.6 % — ABNORMAL HIGH (ref 11.5–15.5)
WBC: 6.4 10*3/uL (ref 4.0–10.5)

## 2014-01-14 LAB — APTT: aPTT: 29 seconds (ref 24–37)

## 2014-01-14 LAB — PROTIME-INR
INR: 1.1 (ref 0.00–1.49)
Prothrombin Time: 14 seconds (ref 11.6–15.2)

## 2014-01-14 SURGERY — INSERTION, PLEURAL DRAINAGE CATHETER
Anesthesia: Monitor Anesthesia Care | Site: Chest | Laterality: Left

## 2014-01-14 MED ORDER — EPHEDRINE SULFATE 50 MG/ML IJ SOLN
INTRAMUSCULAR | Status: AC
  Filled 2014-01-14: qty 1

## 2014-01-14 MED ORDER — SUCCINYLCHOLINE CHLORIDE 20 MG/ML IJ SOLN
INTRAMUSCULAR | Status: AC
  Filled 2014-01-14: qty 1

## 2014-01-14 MED ORDER — PROPOFOL 10 MG/ML IV BOLUS
INTRAVENOUS | Status: AC
  Filled 2014-01-14: qty 20

## 2014-01-14 MED ORDER — 0.9 % SODIUM CHLORIDE (POUR BTL) OPTIME
TOPICAL | Status: DC | PRN
  Administered 2014-01-14: 1000 mL

## 2014-01-14 MED ORDER — ACETAMINOPHEN 160 MG/5ML PO SOLN
650.0000 mg | Freq: Four times a day (QID) | ORAL | Status: DC | PRN
  Administered 2014-01-14: 650 mg via ORAL
  Filled 2014-01-14: qty 20.3

## 2014-01-14 MED ORDER — MIDAZOLAM HCL 5 MG/5ML IJ SOLN
INTRAMUSCULAR | Status: DC | PRN
  Administered 2014-01-14 (×2): 1 mg via INTRAVENOUS

## 2014-01-14 MED ORDER — LIDOCAINE HCL (CARDIAC) 20 MG/ML IV SOLN
INTRAVENOUS | Status: DC | PRN
  Administered 2014-01-14: 60 mg via INTRAVENOUS

## 2014-01-14 MED ORDER — ACETAMINOPHEN 325 MG PO TABS
ORAL_TABLET | ORAL | Status: AC
  Filled 2014-01-14: qty 2

## 2014-01-14 MED ORDER — FENTANYL CITRATE 0.05 MG/ML IJ SOLN
INTRAMUSCULAR | Status: AC
  Filled 2014-01-14: qty 5

## 2014-01-14 MED ORDER — LIDOCAINE HCL 1 % IJ SOLN
INTRAMUSCULAR | Status: DC | PRN
Start: 2014-01-14 — End: 2014-01-14
  Administered 2014-01-14: 15 mL

## 2014-01-14 MED ORDER — FENTANYL CITRATE 0.05 MG/ML IJ SOLN
INTRAMUSCULAR | Status: DC | PRN
  Administered 2014-01-14 (×2): 25 ug via INTRAVENOUS

## 2014-01-14 MED ORDER — ROCURONIUM BROMIDE 50 MG/5ML IV SOLN
INTRAVENOUS | Status: AC
  Filled 2014-01-14: qty 1

## 2014-01-14 MED ORDER — MIDAZOLAM HCL 2 MG/2ML IJ SOLN
INTRAMUSCULAR | Status: AC
  Filled 2014-01-14: qty 2

## 2014-01-14 MED ORDER — LIDOCAINE HCL (CARDIAC) 20 MG/ML IV SOLN
INTRAVENOUS | Status: AC
  Filled 2014-01-14: qty 5

## 2014-01-14 MED ORDER — LACTATED RINGERS IV SOLN
INTRAVENOUS | Status: DC | PRN
  Administered 2014-01-14: 07:00:00 via INTRAVENOUS

## 2014-01-14 MED ORDER — PROPOFOL 10 MG/ML IV BOLUS
INTRAVENOUS | Status: DC | PRN
  Administered 2014-01-14: 10 mg via INTRAVENOUS
  Administered 2014-01-14 (×2): 20 mg via INTRAVENOUS
  Administered 2014-01-14: 30 mg via INTRAVENOUS
  Administered 2014-01-14 (×4): 20 mg via INTRAVENOUS
  Administered 2014-01-14: 30 mg via INTRAVENOUS
  Administered 2014-01-14 (×3): 20 mg via INTRAVENOUS

## 2014-01-14 MED ORDER — SODIUM CHLORIDE 0.9 % IJ SOLN
INTRAMUSCULAR | Status: AC
  Filled 2014-01-14: qty 10

## 2014-01-14 SURGICAL SUPPLY — 30 items
ADH SKN CLS APL DERMABOND .7 (GAUZE/BANDAGES/DRESSINGS) ×1
CANISTER SUCTION 2500CC (MISCELLANEOUS) ×3 IMPLANT
COVER SURGICAL LIGHT HANDLE (MISCELLANEOUS) ×3 IMPLANT
DERMABOND ADVANCED (GAUZE/BANDAGES/DRESSINGS) ×2
DERMABOND ADVANCED .7 DNX12 (GAUZE/BANDAGES/DRESSINGS) ×1 IMPLANT
DRAPE C-ARM 42X72 X-RAY (DRAPES) ×3 IMPLANT
DRAPE LAPAROSCOPIC ABDOMINAL (DRAPES) ×3 IMPLANT
DRSG TEGADERM 4X4.75 (GAUZE/BANDAGES/DRESSINGS) ×2 IMPLANT
GLOVE BIOGEL M 6.5 STRL (GLOVE) ×4 IMPLANT
GLOVE ORTHO TXT STRL SZ7.5 (GLOVE) ×6 IMPLANT
GOWN STRL NON-REIN LRG LVL3 (GOWN DISPOSABLE) ×8 IMPLANT
KIT BASIN OR (CUSTOM PROCEDURE TRAY) ×3 IMPLANT
KIT PLEURX DRAIN CATH 1000ML (MISCELLANEOUS) ×3 IMPLANT
KIT PLEURX DRAIN CATH 15.5FR (DRAIN) ×3 IMPLANT
KIT ROOM TURNOVER OR (KITS) ×3 IMPLANT
NDL HYPO 25GX1X1/2 BEV (NEEDLE) ×1 IMPLANT
NEEDLE HYPO 25GX1X1/2 BEV (NEEDLE) ×3 IMPLANT
NS IRRIG 1000ML POUR BTL (IV SOLUTION) ×3 IMPLANT
PACK GENERAL/GYN (CUSTOM PROCEDURE TRAY) ×3 IMPLANT
PAD ARMBOARD 7.5X6 YLW CONV (MISCELLANEOUS) ×6 IMPLANT
SET DRAINAGE LINE (MISCELLANEOUS) IMPLANT
SPONGE GAUZE 4X4 12PLY STER LF (GAUZE/BANDAGES/DRESSINGS) ×2 IMPLANT
SUT ETHILON 3 0 PS 1 (SUTURE) ×3 IMPLANT
SUT SILK 2 0 SH (SUTURE) ×3 IMPLANT
SUT VIC AB 3-0 SH 27 (SUTURE) ×3
SUT VIC AB 3-0 SH 27X BRD (SUTURE) IMPLANT
SYR CONTROL 10ML LL (SYRINGE) ×3 IMPLANT
TOWEL OR 17X24 6PK STRL BLUE (TOWEL DISPOSABLE) ×3 IMPLANT
TOWEL OR 17X26 10 PK STRL BLUE (TOWEL DISPOSABLE) ×3 IMPLANT
WATER STERILE IRR 1000ML POUR (IV SOLUTION) ×3 IMPLANT

## 2014-01-14 NOTE — Progress Notes (Signed)
The patient was examined and preop studies reviewed. There has been no change from the prior exam and the patient is ready for surgery.   Plan left Pleurex catheter on C Delucchi today.

## 2014-01-14 NOTE — Anesthesia Procedure Notes (Signed)
Procedure Name: MAC Date/Time: 01/14/2014 8:00 AM Performed by: Ned Grace Pre-anesthesia Checklist: Patient identified, Patient being monitored, Emergency Drugs available, Timeout performed and Suction available Patient Re-evaluated:Patient Re-evaluated prior to inductionOxygen Delivery Method: Simple face mask Preoxygenation: Pre-oxygenation with 100% oxygen Intubation Type: IV induction

## 2014-01-14 NOTE — Discharge Instructions (Signed)

## 2014-01-14 NOTE — Anesthesia Preprocedure Evaluation (Addendum)
Anesthesia Evaluation  Patient identified by MRN, date of birth, ID band Patient awake    Reviewed: Allergy & Precautions, H&P , NPO status , Patient's Chart, lab work & pertinent test results  History of Anesthesia Complications (+) history of anesthetic complications  Airway Mallampati: II TM Distance: >3 FB Neck ROM: Full    Dental  (+) Lower Dentures and Upper Dentures   Pulmonary sleep apnea (does not use CPAP) , former smoker,    + decreased breath sounds      Cardiovascular hypertension, Pt. on medications + Past MI (11/14), + CABG (11/14) and +CHF (10/24/13 ECHO: EF 25%, apical akinesis) + dysrhythmias Atrial Fibrillation Rhythm:Regular Rate:Normal     Neuro/Psych negative neurological ROS     GI/Hepatic negative GI ROS, Neg liver ROS,   Endo/Other  negative endocrine ROS  Renal/GU ARFRenal disease (Acute renal failure with MI 11/14, resolved, creat 1.09)     Musculoskeletal   Abdominal   Peds  Hematology  (+) Blood dyscrasia (Hb 10.7), anemia ,   Anesthesia Other Findings   Reproductive/Obstetrics                          Anesthesia Physical Anesthesia Plan  ASA: III  Anesthesia Plan: MAC   Post-op Pain Management:    Induction: Intravenous  Airway Management Planned: Simple Face Mask  Additional Equipment:   Intra-op Plan:   Post-operative Plan:   Informed Consent: I have reviewed the patients History and Physical, chart, labs and discussed the procedure including the risks, benefits and alternatives for the proposed anesthesia with the patient or authorized representative who has indicated his/her understanding and acceptance.   Dental advisory given  Plan Discussed with: CRNA and Surgeon  Anesthesia Plan Comments: (Plan routine monitors, MAC )        Anesthesia Quick Evaluation

## 2014-01-14 NOTE — Care Management Note (Signed)
    Page 1 of 1   01/14/2014     10:10:38 AM   CARE MANAGEMENT NOTE 01/14/2014  Patient:  DEMETRIA, LIGHTSEY   Account Number:  [id number]  Date Initiated:  01/14/2014  Documentation initiated by:  [facility]  Subjective/Objective Assessment:   presented to hospital for pleurix insertion - at prior with [facility] services.     Action/Plan:   Anticipated DC Date:  01/14/2014   Anticipated DC Plan:  [place]  CM consult      [facility] Choice  Resumption Of Svcs/PTA Provider   Choice offered to / List presented to:  C-1 Patient        [place] arranged  HH-1 RN      [place].   Status of service:  Completed, signed off Medicare Important Message given?   (If response is "NO", the following Medicare IM given date fields will be blank) Date Medicare IM given:   Date Additional Medicare IM given:    Discharge Disposition:  [place]  Per UR Regulation:  Reviewed for med. necessity/level of care/duration of stay  If discussed at [place] of Stay Meetings, dates discussed:    Comments:  01-14-14 Donita Brooks, RNBSN [phone] Patient in PACU Pod 7,  States he has been at home with [facility] services prior.  Would like to continue with them. Referral made to [facility].

## 2014-01-14 NOTE — Preoperative (Signed)
Beta Blockers   Reason not to administer Beta Blockers:Not Applicable 

## 2014-01-14 NOTE — Transfer of Care (Signed)
Immediate Anesthesia Transfer of Care Note  Patient: Antonio Bernard  Procedure(s) Performed: Procedure(s): INSERTION PLEURAL DRAINAGE CATHETER (Left)  Patient Location: PACU  Anesthesia Type:MAC  Level of Consciousness: awake, alert , oriented and patient cooperative  Airway & Oxygen Therapy: Patient Spontanous Breathing and Patient connected to nasal cannula oxygen  Post-op Assessment: Report given to PACU RN, Post -op Vital signs reviewed and stable and Patient moving all extremities  Post vital signs: Reviewed and stable  Complications: No apparent anesthesia complications

## 2014-01-14 NOTE — Brief Op Note (Signed)
01/14/2014  8:56 AM  PATIENT:  Antonio Bernard  80 y.o. male  PRE-OPERATIVE DIAGNOSIS:  LEFT PLEURAL EFFUSION, CHF  POST-OPERATIVE DIAGNOSIS:  LEFT PLEURAL EFFUSION, CHF  PROCEDURE:  Procedure(s): INSERTION PLEURAL DRAINAGE CATHETER (Left) Removal 2.0 liters  SURGEON:  Surgeon(s) and Role:    * Ivin Poot, MD - Primary  PHYSICIAN ASSISTANT:   ASSISTANTS: none   ANESTHESIA:   IV sedation, local 1% lidocaine 7 cc  EBL:     BLOOD ADMINISTERED:none  DRAINS:  - pleurexcatheter  LOCAL MEDICATIONS USED:  LIDOCAINE   SPECIMEN:  No Specimen  DISPOSITION OF SPECIMEN:  N/A  COUNTS:  YES  TOURNIQUET:  * No tourniquets in log *  DICTATION: .Dragon Dictation  PLAN OF CARE: Discharge to home after PACU  PATIENT DISPOSITION:  PACU - hemodynamically stable.   Delay start of Pharmacological VTE agent (>24hrs) due to surgical blood loss or risk of bleeding: yes

## 2014-01-14 NOTE — Anesthesia Postprocedure Evaluation (Signed)
  Anesthesia Post-op Note  Patient: Antonio Bernard  Procedure(s) Performed: Procedure(s): INSERTION PLEURAL DRAINAGE CATHETER (Left)  Patient Location: PACU  Anesthesia Type:MAC  Level of Consciousness: awake, alert , oriented and patient cooperative  Airway and Oxygen Therapy: Patient Spontanous Breathing  Post-op Pain: none  Post-op Assessment: Post-op Vital signs reviewed, Patient's Cardiovascular Status Stable, Respiratory Function Stable, Patent Airway, No signs of Nausea or vomiting and Pain level controlled  Post-op Vital Signs: Reviewed and stable  Complications: No apparent anesthesia complications

## 2014-01-15 NOTE — Op Note (Signed)
NAMECELESTE, CANDELAS NO.:  [id number]  MEDICAL RECORD NO.:  [id number]  LOCATION:  [facility]                         FACILITY:  [place]  PHYSICIAN:  Ivin Poot, M.D.  DATE OF BIRTH:  Feb 15, 1933  DATE OF PROCEDURE:  01/14/2014 DATE OF DISCHARGE:  01/14/2014                              OPERATIVE REPORT   OPERATION:  Placement of left PleurX catheter.  PREOPERATIVE DIAGNOSES:  Recurrent left pleural effusion, history of congestive heart failure.  POSTOPERATIVE DIAGNOSES:  Recurrent left pleural effusion, history of congestive heart failure.  SURGEON:  Ivin Poot, M.D.  ANESTHESIA:  Local 1% lidocaine with IV conscious monitored sedation.  INDICATION:  The patient is  80 years old.  Three months ago, he presented with a completed MI with CPK-MB greater than 600.  He underwent urgent multivessel bypass grafting.  He had a long postop recovery with evidence of LV dysfunction.  He is currently being followed as an outpatient in the [facility] with EF of 25% to 30%.  He has had refractory left pleural effusion, and has had recurrence after a left thoracentesis within the past 2 weeks.  PleurX catheter was recommended as a way of managing his fluid and to improve his symptoms to help his recovery and recuperation.  Prior to surgery, I examined the patient in the office and discussed the procedure of left PleurX catheter placement with the patient and his daughters.  They understood and agreed to proceed under what I felt was an informed consent.  They understood the risks of bleeding, recurrent effusion, infection, pneumothorax, and death.  OPERATIVE PROCEDURE:  The patient was brought to the operating room, placed supine on the operating table.  He was given IV conscious sedation, and was monitored by the anesthesia team.  The left chest was prepped and draped as a sterile field.  A proper time-out was performed. Local 1% lidocaine was  infiltrated in the 6th interspace in the anterior axillary line and then in the lower costal margin in the midclavicular line.  Needle localization of the fluid in the left pleural space was identified with a 22-gauge needle.  Next, a small catheter sheath was inserted into pleural space and through the sheath, a guidewire was passed.  This was confirmed to be the pleural space in a proper position by C-arm fluoroscopy.  Next, incision was made in the lower costal margin for the exit site and the catheter was tunneled through to the upper incision.  Over the guidewire, the dilators and the tear-away sheath was placed and the catheter was inserted in the pleural space through the tear-away sheath. It was again visualized with fluoroscopy and found to be in proper position.  The catheter was then connected to the vacuum bottle and 2 L of xanthochromic fluid was removed.  The 2 incisions were closed and a sterile dressing were applied.  A postprocedure chest x-ray in the OR showed no pneumothorax with the catheter in good position.     Ivin Poot, M.D.     PV/MEDQ  D:  01/14/2014  T:  01/15/2014  Job:  [id number]  cc:   Shaune Pascal. Bensimhon, MD [facility]

## 2014-01-16 ENCOUNTER — Encounter (HOSPITAL_COMMUNITY): Payer: Self-pay | Admitting: Emergency Medicine

## 2014-01-16 ENCOUNTER — Emergency Department (HOSPITAL_COMMUNITY)
Admission: EM | Admit: 2014-01-16 | Discharge: 2014-01-16 | Disposition: A | Discharge status: Home / Self Care | Payer: Medicare Other | Attending: Emergency Medicine | Admitting: Emergency Medicine

## 2014-01-16 ENCOUNTER — Telehealth: Payer: Self-pay

## 2014-01-16 ENCOUNTER — Telehealth (HOSPITAL_COMMUNITY): Payer: Self-pay | Admitting: *Deleted

## 2014-01-16 ENCOUNTER — Emergency Department (HOSPITAL_COMMUNITY): Payer: Medicare Other

## 2014-01-16 DIAGNOSIS — Z8669 Personal history of other diseases of the nervous system and sense organs: Secondary | ICD-10-CM | POA: Insufficient documentation

## 2014-01-16 DIAGNOSIS — Z7901 Long term (current) use of anticoagulants: Secondary | ICD-10-CM | POA: Insufficient documentation

## 2014-01-16 DIAGNOSIS — E669 Obesity, unspecified: Secondary | ICD-10-CM | POA: Insufficient documentation

## 2014-01-16 DIAGNOSIS — Z87891 Personal history of nicotine dependence: Secondary | ICD-10-CM | POA: Insufficient documentation

## 2014-01-16 DIAGNOSIS — Z87448 Personal history of other diseases of urinary system: Secondary | ICD-10-CM | POA: Insufficient documentation

## 2014-01-16 DIAGNOSIS — Z7982 Long term (current) use of aspirin: Secondary | ICD-10-CM | POA: Insufficient documentation

## 2014-01-16 DIAGNOSIS — Z8719 Personal history of other diseases of the digestive system: Secondary | ICD-10-CM | POA: Insufficient documentation

## 2014-01-16 DIAGNOSIS — Z8701 Personal history of pneumonia (recurrent): Secondary | ICD-10-CM | POA: Insufficient documentation

## 2014-01-16 DIAGNOSIS — Z79899 Other long term (current) drug therapy: Secondary | ICD-10-CM | POA: Insufficient documentation

## 2014-01-16 DIAGNOSIS — M25512 Pain in left shoulder: Secondary | ICD-10-CM

## 2014-01-16 DIAGNOSIS — F410 Panic disorder [episodic paroxysmal anxiety] without agoraphobia: Secondary | ICD-10-CM | POA: Insufficient documentation

## 2014-01-16 DIAGNOSIS — J90 Pleural effusion, not elsewhere classified: Secondary | ICD-10-CM

## 2014-01-16 DIAGNOSIS — Z9889 Other specified postprocedural states: Secondary | ICD-10-CM | POA: Insufficient documentation

## 2014-01-16 DIAGNOSIS — Z951 Presence of aortocoronary bypass graft: Secondary | ICD-10-CM | POA: Insufficient documentation

## 2014-01-16 DIAGNOSIS — M5412 Radiculopathy, cervical region: Secondary | ICD-10-CM | POA: Insufficient documentation

## 2014-01-16 DIAGNOSIS — Z862 Personal history of diseases of the blood and blood-forming organs and certain disorders involving the immune mechanism: Secondary | ICD-10-CM | POA: Insufficient documentation

## 2014-01-16 DIAGNOSIS — I5022 Chronic systolic (congestive) heart failure: Secondary | ICD-10-CM

## 2014-01-16 DIAGNOSIS — I10 Essential (primary) hypertension: Secondary | ICD-10-CM | POA: Insufficient documentation

## 2014-01-16 DIAGNOSIS — M25519 Pain in unspecified shoulder: Secondary | ICD-10-CM

## 2014-01-16 DIAGNOSIS — I209 Angina pectoris, unspecified: Secondary | ICD-10-CM | POA: Insufficient documentation

## 2014-01-16 DIAGNOSIS — I4891 Unspecified atrial fibrillation: Secondary | ICD-10-CM | POA: Insufficient documentation

## 2014-01-16 LAB — CBC
HCT: 35 % — ABNORMAL LOW (ref 39.0–52.0)
Hemoglobin: 11.3 g/dL — ABNORMAL LOW (ref 13.0–17.0)
MCH: 28.6 pg (ref 26.0–34.0)
MCHC: 32.3 g/dL (ref 30.0–36.0)
MCV: 88.6 fL (ref 78.0–100.0)
Platelets: 373 10*3/uL (ref 150–400)
RBC: 3.95 MIL/uL — ABNORMAL LOW (ref 4.22–5.81)
RDW: 15.3 % (ref 11.5–15.5)
WBC: 6.3 10*3/uL (ref 4.0–10.5)

## 2014-01-16 LAB — BASIC METABOLIC PANEL
BUN: 26 mg/dL — ABNORMAL HIGH (ref 6–23)
CO2: 29 mEq/L (ref 19–32)
Calcium: 9.4 mg/dL (ref 8.4–10.5)
Chloride: 99 mEq/L (ref 96–112)
Creatinine, Ser: 1.03 mg/dL (ref 0.50–1.35)
GFR calc Af Amer: 78 mL/min — ABNORMAL LOW (ref >90)
GFR calc non Af Amer: 67 mL/min — ABNORMAL LOW (ref >90)
Glucose, Bld: 98 mg/dL (ref 70–99)
Potassium: 3.7 mEq/L (ref 3.7–5.3)
Sodium: 141 mEq/L (ref 137–147)

## 2014-01-16 LAB — PRO B NATRIURETIC PEPTIDE: Pro B Natriuretic peptide (BNP): 7699 pg/mL — ABNORMAL HIGH (ref 0–450)

## 2014-01-16 LAB — POCT I-STAT TROPONIN I: Troponin i, poc: 0.02 ng/mL (ref 0.00–0.08)

## 2014-01-16 MED ORDER — TRAMADOL HCL 50 MG PO TABS: 50.0000 mg | ORAL_TABLET | Freq: Four times a day (QID) | ORAL | Status: DC | PRN

## 2014-01-16 MED ORDER — TRAMADOL HCL 50 MG PO TABS
50.0000 mg | ORAL_TABLET | Freq: Once | ORAL | Status: AC
  Administered 2014-01-16: 50 mg via ORAL
  Filled 2014-01-16: qty 1

## 2014-01-16 MED ORDER — HYDROCODONE-ACETAMINOPHEN 5-325 MG PO TABS
1.0000 | ORAL_TABLET | Freq: Once | ORAL | Status: DC
  Filled 2014-01-16: qty 1

## 2014-01-16 NOTE — ED Provider Notes (Signed)
3:16 PM 80yM with LUE pain. Acute onset shortly before arrival while on toilet having a BM.  Sharp pain from neck, down shoulder and radiating into thumb and first finger. Very severe at onset, but now no pain at rest but will get it with certain neck movement. No change in strength.   On exam motor, intact LUE in radial, median and ulnar nerve. Sensation intact in in same distributions. Palpable radial pulse. Brisk cap refill in finger tips.  Gauze bandage over pleural catheter with some drainage on it. Bandage removed. Insertion site looks fine. Sutures intact. No concerning surrounding skin changes. No drainage. Bandage just above this dry.  Symptoms very consistent with cervical radiculopathy. Family very concerned that this related to recent procedure or that he may have a PE? Symptoms are not consistent with either.   Virgel Manifold, MD 01/20/14 [phone]

## 2014-01-16 NOTE — ED Notes (Signed)
Pt denies tingling in the arm anymore. Pt states it just feels different and is hard to explain.

## 2014-01-16 NOTE — Discharge Instructions (Signed)
Return here as needed. Follow up with your doctor for a recheck. °

## 2014-01-16 NOTE — ED Notes (Addendum)
Pt sent here by pcp for L arm pain and numbness that radiates up the L side of his neck.  Hx of open heart surgery 11/4.  Pt also had 2 L removed from L lung 2 days prior.  Pt also c/o some sob.  Per daughter, Dr Melvenia Needles placed tube to L lung to remove fluid and wants him to come to ED to r/o PE.

## 2014-01-16 NOTE — Telephone Encounter (Signed)
Denise/ (DGT) of patient call this AM stating that Antonio Bernard develop severe pain in the neck, left shoulder and arm this am after straining on the commode/toilet. He took tylenol with no relief. His weight is down this am, She states that his breathing seems labored. The Advance nurse is scheduled to drain Pleurx today at 12 noon ( for the first time), But given the current Sx's I strongly recommended her to take Antonio Bernard to the ED for eval.  I will inform Dr.Van Trigt and she agreed.

## 2014-01-16 NOTE — ED Provider Notes (Signed)
CSN: [id number]     Arrival date & time 01/16/14  1216 History   First MD Initiated Contact with Patient 01/16/14 1328     Chief Complaint  Patient presents with  . Arm Pain     (Consider location/radiation/quality/duration/timing/severity/associated sxs/prior Treatment) HPI  Patient presents to the emergency department with left neck pain, that radiates to the left arm and hand.  The patient was told to come to the emergency department by his cardiothoracic surgeon.  The patient does not have any chest pain, shortness of breath, nausea, vomiting, diarrhea, headache, blurred vision, weakness, dizziness , back pain, dysuria, abdominal pain, or syncope.  The patient, states, that he did not take any medications prior to arrival for this.  The patient, states, that movement and palpation make the pain, worse. Past Medical History  Diagnosis Date  . Acid reflux   . Family history of anesthesia complication     daughter had problem waking up  . Pneumonia     hx of  . Sleep apnea     no treatment for, last sleep study over 5 years ago  . Abdominal aortic aneurysm     found 06/29/12 "small"  . Headache(784.0)   . Herniated vertebral disc     thoracic area  . Cataracts, bilateral     hx of  . H/O Legionnaire's disease     about 24 years ago  . Anxiety     panic attacks   . Claustrophobia     "severe"  . Hypertension     sees  Dr. Alyson Ingles [phone]  . Obesity (BMI 35.0-39.9 without comorbidity) 10/06/2013  . Essential hypertension 10/06/2013  . Anginal pain   . Atrial fibrillation     Postoperative  . Acute renal insufficiency     Postoperative  . Anemia associated with acute blood loss     Postoperative   Past Surgical History  Procedure Laterality Date  . Shoulder surgery      left  . Cataract extraction w/ intraocular lens implant      bilateral  . Lumbar laminectomy/decompression microdiscectomy  07/20/2012    Procedure: LUMBAR LAMINECTOMY/DECOMPRESSION  MICRODISCECTOMY 1 LEVEL;  Surgeon: Faythe Ghee, MD;  Location: [facility];  Service: Neurosurgery;  Laterality: N/A;  lumbar four-five left  . Back surgery    . Cardiac catheterization  10/05/2013  . Eye surgery    . Coronary artery bypass graft N/A 10/08/2013    Procedure: CORONARY ARTERY BYPASS GRAFTING (CABG);  Surgeon: Ivin Poot, MD;  Location: [facility] [place];  Service: Open Heart Surgery;  Laterality: N/A;  Times 2 using left internal mammary artery and endoscopically harvested left saphenous vein  . Intraoperative transesophageal echocardiogram N/A 10/08/2013    Procedure: INTRAOPERATIVE TRANSESOPHAGEAL ECHOCARDIOGRAM;  Surgeon: Ivin Poot, MD;  Location: [place];  Service: Open Heart Surgery;  Laterality: N/A;  . Chest tube insertion Left 01/14/2014    Procedure: INSERTION PLEURAL DRAINAGE CATHETER;  Surgeon: Ivin Poot, MD;  Location: [place];  Service: Thoracic;  Laterality: Left;   No family history on file. History  Substance Use Topics  . Smoking status: Former Smoker -- 0.50 packs/day    Types: Cigarettes, Cigars  . Smokeless tobacco: Never Used     Comment: 50 years ago  . Alcohol Use: No    Review of Systems All other systems negative except as documented in the HPI. All pertinent positives and negatives as reviewed in the HPI.   Allergies  Demerol; Lisinopril;  and Shellfish allergy  Home Medications   Current Outpatient Rx  Name  Route  Sig  Dispense  Refill  . acetaminophen (TYLENOL) 325 MG tablet   Oral   Take 2 tablets (650 mg total) by mouth every 6 (six) hours as needed for mild pain, fever or headache.         . ALPRAZolam (XANAX) 0.25 MG tablet   Oral   Take 0.25-1 mg by mouth 4 (four) times daily as needed for anxiety. Take 0.25 mg through the day as needed and take 1mg  at bedtime.         . Ascorbic Acid (VITAMIN C) 1000 MG tablet   Oral   Take 1,000 mg by mouth daily.         Marland Kitchen aspirin EC 81 MG tablet   Oral   Take 81 mg by mouth  daily.         . cholecalciferol (VITAMIN D) 1000 UNITS tablet   Oral   Take 2,000 Units by mouth daily.         . citalopram (CELEXA) 20 MG tablet   Oral   Take 1 tablet (20 mg total) by mouth daily.   30 tablet   6   . digoxin (LANOXIN) 0.125 MG tablet   Oral   Take 1 tablet (0.125 mg total) by mouth daily.   30 tablet   6   . docusate sodium (COLACE) 100 MG capsule   Oral   Take 200 mg by mouth daily.         . feeding supplement, GLUCERNA SHAKE, (GLUCERNA SHAKE) LIQD   Oral   Take 237 mLs by mouth 3 (three) times daily between meals as needed (meal replacement).         . Multiple Vitamin (MULTIVITAMIN WITH MINERALS) TABS tablet   Oral   Take 1 tablet by mouth daily.         . nitroGLYCERIN (NITROSTAT) 0.3 MG SL tablet   Sublingual   Place 1 tablet (0.3 mg total) under the tongue every 5 (five) minutes as needed for chest pain.   90 tablet   12   . polyethylene glycol (MIRALAX / GLYCOLAX) packet   Oral   Take 17 g by mouth daily as needed for mild constipation.         . potassium chloride SA (K-DUR,KLOR-CON) 20 MEQ tablet   Oral   Take 1 tablet (20 mEq total) by mouth 3 (three) times a week. Only if he take torsemide   30 tablet   6   . Rivaroxaban (XARELTO) 20 MG TABS tablet   Oral   Take 1 tablet (20 mg total) by mouth daily with supper.   30 tablet   6   . torsemide (DEMADEX) 20 MG tablet   Oral   Take 1 tablet (20 mg total) by mouth 3 (three) times a week.   30 tablet   6   . Wheat Dextrin (BENEFIBER PO)   Oral   Take 10 mLs by mouth 2 (two) times daily.          BP 144/80  Pulse 83  Temp(Src) 97.9 F (36.6 C) (Oral)  Resp 17  Ht 6\' 1"  (1.854 m)  Wt 215 lb 12.8 oz (97.886 kg)  BMI 28.48 kg/m2  SpO2 98% Physical Exam  Nursing note and vitals reviewed. Constitutional: He is oriented to person, place, and time. He appears well-developed and well-nourished. No distress.  HENT:  Head: Normocephalic  and atraumatic.  Eyes:  Pupils are equal, round, and reactive to light.  Neck: Normal range of motion. Neck supple.  Cardiovascular: Normal rate, regular rhythm and normal heart sounds.  Exam reveals no gallop and no friction rub.   No murmur heard. Pulmonary/Chest: Effort normal and breath sounds normal. No respiratory distress.  Musculoskeletal:       Back:  Neurological: He is alert and oriented to person, place, and time. He has normal reflexes. He exhibits normal muscle tone. Coordination and gait normal. GCS eye subscore is 4. GCS verbal subscore is 5. GCS motor subscore is 6.  Reflex Scores:      Tricep reflexes are 2+ on the right side and 2+ on the left side.      Bicep reflexes are 2+ on the right side and 2+ on the left side. Skin: Skin is warm and dry. No rash noted. No erythema.    ED Course  Procedures (including critical care time) Labs Review Labs Reviewed  CBC - Abnormal; Notable for the following:    RBC 3.95 (*)    Hemoglobin 11.3 (*)    HCT 35.0 (*)    All other components within normal limits  BASIC METABOLIC PANEL - Abnormal; Notable for the following:    BUN 26 (*)    GFR calc non Af Amer 67 (*)    GFR calc Af Amer 78 (*)    All other components within normal limits  PRO B NATRIURETIC PEPTIDE - Abnormal; Notable for the following:    Pro B Natriuretic peptide (BNP) 7699.0 (*)    All other components within normal limits  POCT I-STAT TROPONIN I   Imaging Review Dg Chest 2 View  01/16/2014   CLINICAL DATA:  Chest and left arm pain, history coronary artery disease post CABG, hypertension  EXAM: CHEST  2 VIEW  COMPARISON:  01/14/2014  FINDINGS: Enlargement of cardiac silhouette post CABG.  Atherosclerotic calcification aorta.  Mediastinal contours pulmonary vascularity normal.  Bibasilar effusions and atelectasis, slightly greater on left.  Question minimal perihilar edema.  No pneumothorax.  Bones unremarkable.  IMPRESSION: Enlargement of cardiac silhouette post CABG with suspect mild  pulmonary edema.  Bibasilar pleural effusions and atelectasis slightly greater on left.   Electronically Signed   By: Lavonia Dana M.D.   On: 01/16/2014 13:40     The patient will be discharged home.  He was seen by his cardiologist as well, who agreed with the plan.  The patient agrees and will follow with his primary care Dr. patient most likely has cervical radiculopathy  Brent General, PA-C 01/16/14 1551

## 2014-01-16 NOTE — Consult Note (Signed)
Reason for Consultation: Shoulder pain   HPI:  Antonio Bernard is a 80 yo male with a hx of HTN, severe anxiety, OSA , systolic HF due to ICM EF 25% 10/15/2013, S/P CABG x 2 LIMA-LAD; SVG-DIAG on 10/08/13.   On 10/05/2013 had late presentation with anterior MI. Underwent emergent LAD PCI followed by emergent CABG. Postoperative course was complicated by shock and cardiorenal syndrome which required lasix drip, milrinone and dopamine. His post operative course was also complicated by DVT in his R peroneal vein. As he improved all drips were weaned off and he was transferred to CIR on 10/24/13. He was later transferred back to acute care with volme overload and low output. Placed back on Milrinone and lasix drip. Later this was weaned off. Course also c/b orthostatic hypotension. EF 25%. Discharged home with LifeVest.   Underwent placement of L Pleurex catheter for recurrent pleural effusion on 01/14/14. Drained 2L. Today was straining to have BM and had acute, severe pain in L shoulder and neck with tingling in thumb and forefinger. Pain last several hours. Now better. CXR with no PTX.      Review of Systems:     Cardiac Review of Systems: {Y] = yes [ ]  = no  Chest Pain [    ]  Resting SOB [   ] Exertional SOB  [ y ]  Orthopnea [  ]   Pedal Edema [   ]    Palpitations [  ] Syncope  [  ]   Presyncope [   ]  General Review of Systems: [Y] = yes [  ]=no Constitional: recent weight change [  ]; anorexia [  ]; fatigue Blue.Reese  ]; nausea [  ]; night sweats [  ]; fever [  ]; or chills [  ];                                                                                                                                          Dental: poor dentition[  ];  Eye : blurred vision [  ]; diplopia [   ]; vision changes [  ];  Amaurosis fugax[  ]; Resp: cough [  ];  wheezing[  ];  hemoptysis[  ]; shortness of breath[  ]; paroxysmal nocturnal dyspnea[  ]; dyspnea on exertion[  ]; or orthopnea[  ];  GI:  gallstones[   ], vomiting[  ];  dysphagia[  ]; melena[  ];  hematochezia [  ]; heartburn[  ];    GU: kidney stones [  ]; hematuria[  ];   dysuria [  ];  nocturia[  ];  history of     obstruction [  ];                 Skin: rash, swelling[  ];, hair loss[  ];  peripheral edema[  ];  or itching[  ];  Musculosketetal: myalgias[  ];  joint swelling[  ];  joint erythema[  ];  joint pain[ y ];  back pain[ y ];  Heme/Lymph: bruising[  ];  bleeding[  ];  anemia[  ];  Neuro: TIA[  ];  headaches[  ];  stroke[  ];  vertigo[  ];  seizures[  ];   paresthesias[  ];  difficulty walking[  ];  Psych:depression[  ]; anxiety[ y ];  Endocrine: diabetes[  ];  thyroid dysfunction[  ];  Other:  Past Medical History  Diagnosis Date  . Acid reflux   . Family history of anesthesia complication     daughter had problem waking up  . Pneumonia     hx of  . Sleep apnea     no treatment for, last sleep study over 5 years ago  . Abdominal aortic aneurysm     found 06/29/12 "small"  . Headache(784.0)   . Herniated vertebral disc     thoracic area  . Cataracts, bilateral     hx of  . H/O Legionnaire's disease     about 24 years ago  . Anxiety     panic attacks   . Claustrophobia     "severe"  . Hypertension     sees  Dr. Alyson Ingles [phone]  . Obesity (BMI 35.0-39.9 without comorbidity) 10/06/2013  . Essential hypertension 10/06/2013  . Anginal pain   . Atrial fibrillation     Postoperative  . Acute renal insufficiency     Postoperative  . Anemia associated with acute blood loss     Postoperative     (Not in a hospital admission)     Infusions:   Allergies  Allergen Reactions  . Demerol [Meperidine] Other (See Comments)    "makes me crazy"  . Lisinopril Diarrhea  . Shellfish Allergy Rash    History   Social History  . Marital Status: Widowed    Spouse Name: N/A    Number of Children: N/A  . Years of Education: N/A   Occupational History  . Retired    Social History Main Topics  . Smoking  status: Former Smoker -- 0.50 packs/day    Types: Cigarettes, Cigars  . Smokeless tobacco: Never Used     Comment: 50 years ago  . Alcohol Use: No  . Drug Use: No  . Sexual Activity: No   Other Topics Concern  . Not on file   Social History Narrative   Was living alone prior to admission.    No family history on file.  PHYSICAL EXAM: Filed Vitals:   01/16/14 1530  BP: 146/73  Pulse:   Temp:   Resp:     No intake or output data in the 24 hours ending 01/16/14 1538  General:  Elderly No respiratory difficulty HEENT: normal Neck: supple. no JVD. Carotids 2+ bilat; no bruits. No lymphadenopathy or thryomegaly appreciated. Cor: PMI nondisplaced. Regular rate & rhythm. No rubs, gallops or murmurs. Lungs: clear. Decreased at bases. Pleurex cat site ok  Abdomen: soft, nontender, nondistended. No hepatosplenomegaly. No bruits or masses. Good bowel sounds. Extremities: no cyanosis, clubbing, rash, edema Neuro: alert & oriented x 3, cranial nerves grossly intact. moves all 4 extremities w/o difficulty. Affect pleasant. + pain over palpation of left trapezoid. Radial pulses and sensation ok. No weakness in LUE.    Results for orders placed during the hospital encounter of 01/16/14 (from the past 24 hour(s))  CBC     Status: Abnormal   Collection Time  01/16/14 12:43 PM      Result Value Ref Range   WBC 6.3  4.0 - 10.5 K/uL   RBC 3.95 (*) 4.22 - 5.81 MIL/uL   Hemoglobin 11.3 (*) 13.0 - 17.0 g/dL   HCT 35.0 (*) 39.0 - 52.0 %   MCV 88.6  78.0 - 100.0 fL   MCH 28.6  26.0 - 34.0 pg   MCHC 32.3  30.0 - 36.0 g/dL   RDW 15.3  11.5 - 15.5 %   Platelets 373  150 - 400 K/uL  BASIC METABOLIC PANEL     Status: Abnormal   Collection Time    01/16/14 12:43 PM      Result Value Ref Range   Sodium 141  137 - 147 mEq/L   Potassium 3.7  3.7 - 5.3 mEq/L   Chloride 99  96 - 112 mEq/L   CO2 29  19 - 32 mEq/L   Glucose, Bld 98  70 - 99 mg/dL   BUN 26 (*) 6 - 23 mg/dL   Creatinine, Ser  1.03  0.50 - 1.35 mg/dL   Calcium 9.4  8.4 - 10.5 mg/dL   GFR calc non Af Amer 67 (*) >90 mL/min   GFR calc Af Amer 78 (*) >90 mL/min  PRO B NATRIURETIC PEPTIDE     Status: Abnormal   Collection Time    01/16/14 12:43 PM      Result Value Ref Range   Pro B Natriuretic peptide (BNP) 7699.0 (*) 0 - 450 pg/mL  POCT I-STAT TROPONIN I     Status: None   Collection Time    01/16/14  1:10 PM      Result Value Ref Range   Troponin i, poc 0.02  0.00 - 0.08 ng/mL   Comment 3            Dg Chest 2 View  01/16/2014   CLINICAL DATA:  Chest and left arm pain, history coronary artery disease post CABG, hypertension  EXAM: CHEST  2 VIEW  COMPARISON:  01/14/2014  FINDINGS: Enlargement of cardiac silhouette post CABG.  Atherosclerotic calcification aorta.  Mediastinal contours pulmonary vascularity normal.  Bibasilar effusions and atelectasis, slightly greater on left.  Question minimal perihilar edema.  No pneumothorax.  Bones unremarkable.  IMPRESSION: Enlargement of cardiac silhouette post CABG with suspect mild pulmonary edema.  Bibasilar pleural effusions and atelectasis slightly greater on left.   Electronically Signed   By: Lavonia Dana M.D.   On: 01/16/2014 13:40     ASSESSMENT: 1) L upper back and neck pain 2) L Pleural effusion s/p Pleurex catheter drainage 2/10 3) Chronic systolic HF 4) CAD s/p CABG  PLAN/DISCUSSION:  Pain appears to be musculoskeletal - probable L cervical radiculopathy. Doubt this is cardiac. No weakness or neuro compromise. Can go home with pain meds (Ultram). F/u PCP as needed. D/w Drs. Prescott Gum and Kohut.  Genavie Boettger,MD 3:41 PM

## 2014-01-16 NOTE — Telephone Encounter (Signed)
Pt's daughter called concerned about pt, she states he went to the bathroom this AM and was straining to have a bowel movement, and then began to have neck pain that is radiating to his shoulder and down his left arm, he describes pain as constant and rates it a 6-7/10, denies SOB and n/v, he has taken ntg and tylenol with no relief, he states his fingers are beginning to feel numb as well, advised did not sound cardiac should f/u with pcp or urgent care, daughter is agreeable

## 2014-01-17 ENCOUNTER — Other Ambulatory Visit: Payer: Self-pay | Admitting: *Deleted

## 2014-01-17 DIAGNOSIS — J90 Pleural effusion, not elsewhere classified: Secondary | ICD-10-CM

## 2014-01-20 NOTE — ED Provider Notes (Addendum)
Medical screening examination/treatment/procedure(s) were conducted as a shared visit with non-physician practitioner(s) and myself.  I personally evaluated the patient during the encounter.  EKG Interpretation    Date/Time:  Thursday January 16 2014 12:42:13 EST Ventricular Rate:  88 PR Interval:  158 QRS Duration: 113 QT Interval:  405 QTC Calculation: 490 R Axis:   -108 Text Interpretation:  Sinus rhythm Probable left atrial enlargement LAD, consider left anterior fascicular block Anterior infarct, old Nonspecific T abnormalities, lateral leads ED PHYSICIAN INTERPRETATION AVAILABLE IN [facility] Confirmed by TEST, RECORD ([id number]) on 01/19/2014 7:01:20 AM            EKG Interpretation    Date/Time:  Thursday January 16 2014 12:42:13 EST Ventricular Rate:  88 PR Interval:  158 QRS Duration: 113 QT Interval:  405 QTC Calculation: 490 R Axis:   -108 Text Interpretation:  Sinus rhythm Probable left atrial enlargement LAD, consider left anterior fascicular block Anterior infarct, old Nonspecific T abnormalities, lateral leads ED PHYSICIAN INTERPRETATION AVAILABLE IN [facility] Confirmed by TEST, RECORD ([id number]) on 01/19/2014 7:01:20 AM             Virgel Manifold, MD 01/20/14 1414  Virgel Manifold, MD 01/20/14 1451

## 2014-01-22 ENCOUNTER — Ambulatory Visit (INDEPENDENT_AMBULATORY_CARE_PROVIDER_SITE_OTHER): Payer: Medicare Other | Admitting: Physician Assistant

## 2014-01-22 ENCOUNTER — Ambulatory Visit: Payer: Medicare Other | Admitting: Cardiothoracic Surgery

## 2014-01-22 ENCOUNTER — Ambulatory Visit
Admission: RE | Admit: 2014-01-22 | Discharge: 2014-01-22 | Disposition: A | Discharge status: Home / Self Care | Payer: Medicare Other | Source: Ambulatory Visit | Attending: Cardiothoracic Surgery | Admitting: Cardiothoracic Surgery

## 2014-01-22 ENCOUNTER — Encounter: Payer: Self-pay | Admitting: Cardiothoracic Surgery

## 2014-01-22 VITALS — BP 107/58 | HR 79 | Resp 20 | Ht 73.0 in | Wt 215.0 lb

## 2014-01-22 DIAGNOSIS — J90 Pleural effusion, not elsewhere classified: Secondary | ICD-10-CM

## 2014-01-22 DIAGNOSIS — Z09 Encounter for follow-up examination after completed treatment for conditions other than malignant neoplasm: Secondary | ICD-10-CM

## 2014-01-22 NOTE — Progress Notes (Addendum)
[address]       [place] [address]             [phone]                  Zorian A Hershkowitz [facility] Medical Record #[id number] Date of Birth: [birth year]  Referring KX:[id number], Leonie Green, MD Primary Cardiology: Vidal Schwalbe, MD  Chief Complaint:   PostOp Follow Up Visit   History of Present Illness:     This is a 80 year old Caucasian male who is s/p left Pleur X catheter (for CHF) by Dr. Prescott Gum on 01/14/2014.  The catheter is being drained daily. The average output has been between 800-900 cc. Our nurse drained it in the office today and got 800 cc. The patient denies shortness of breath, chest pain, fever, or chills. He was seen in the [facility] ER 01/16/2014 with complaints of left neck pain that radiated to his left arm and hand. This occurred after he had a bowel movement. CXR done at that time showed no pneumothorax, bilateral pleural effusions and atelectasis, and pulmonary edema. In addition to the emergency room physician, Dr. Haroldine Laws saw and evaluated the patient (patient known to hi.m). It was felt the patient's symptoms were not cardiac related and that he had left cervacal radiculopathy   History  Smoking status  . Former Smoker -- 0.50 packs/day  . Types: Cigarettes, Cigars  Smokeless tobacco  . Never Used    Comment: 50 years ago       Allergies  Allergen Reactions  . Demerol [Meperidine] Other (See Comments)    "makes me crazy"  . Lisinopril Diarrhea  . Shellfish Allergy Rash    Current Outpatient Prescriptions  Medication Sig Dispense Refill  . acetaminophen (TYLENOL) 325 MG tablet Take 2 tablets (650 mg total) by mouth every 6 (six) hours as needed for mild pain, fever or headache.      . ALPRAZolam (XANAX) 0.25 MG tablet Take 0.25-1 mg by mouth 4 (four) times daily as needed for anxiety. Take 0.25 mg through the day as needed and take 1mg  at bedtime.      . Ascorbic Acid (VITAMIN C) 1000 MG tablet  Take 1,000 mg by mouth daily.      Marland Kitchen aspirin EC 81 MG tablet Take 81 mg by mouth daily.      . cholecalciferol (VITAMIN D) 1000 UNITS tablet Take 2,000 Units by mouth daily.      . citalopram (CELEXA) 20 MG tablet Take 1 tablet (20 mg total) by mouth daily.  30 tablet  6  . digoxin (LANOXIN) 0.125 MG tablet Take 1 tablet (0.125 mg total) by mouth daily.  30 tablet  6  . docusate sodium (COLACE) 100 MG capsule Take 200 mg by mouth daily.      . feeding supplement, GLUCERNA SHAKE, (GLUCERNA SHAKE) LIQD Take 237 mLs by mouth 3 (three) times daily between meals as needed (meal replacement).      . Multiple Vitamin (MULTIVITAMIN WITH MINERALS) TABS tablet Take 1 tablet by mouth daily.      . nitroGLYCERIN (NITROSTAT) 0.3 MG SL tablet Place 1 tablet (0.3 mg total) under the tongue every 5 (five) minutes as needed for chest pain.  90 tablet  12  . polyethylene glycol (MIRALAX / GLYCOLAX) packet Take 17 g by mouth daily as needed for mild constipation.      . potassium chloride SA (K-DUR,KLOR-CON) 20 MEQ tablet  Take 1 tablet (20 mEq total) by mouth 3 (three) times a week. Only if he take torsemide  30 tablet  6  . Rivaroxaban (XARELTO) 20 MG TABS tablet Take 1 tablet (20 mg total) by mouth daily with supper.  30 tablet  6  . torsemide (DEMADEX) 20 MG tablet Take 1 tablet (20 mg total) by mouth 3 (three) times a week.  30 tablet  6  . traMADol (ULTRAM) 50 MG tablet Take 1 tablet (50 mg total) by mouth every 6 (six) hours as needed.  15 tablet  0  . Wheat Dextrin (BENEFIBER PO) Take 10 mLs by mouth 2 (two) times daily.       No current facility-administered medications for this visit.       Physical Exam: BP 107/58  Pulse 79  Resp 20  Ht 6\' 1"  (1.854 m)  Wt 97.523 kg (215 lb)  BMI 28.37 kg/m2  SpO2 95%  General appearance: alert, cooperative and no distress Heart: regular rate and rhythm Lungs: Slightly diminished at bases;no rales, wheezes, or rhonchi Abdomen: soft, non-tender; bowel sounds  normal; no masses,  no organomegaly Extremities: No lower extremity edema Wound: Clean and dry  Diagnostic Studies & Laboratory data:         Recent Radiology Findings: Dg Chest 2 View  01/22/2014   CLINICAL DATA:  Left pleural effusion.  EXAM: CHEST  2 VIEW  COMPARISON:  01/16/2014  FINDINGS: Two-view exam shows no evidence for pulmonary edema. No focal airspace consolidation. Stable appearance of the tiny right pleural effusion. Small left pleural effusion is stable to slightly decreased in the interval with the left pleural drain still visualized in-situ. There is some associated atelectasis at the left base. The cardio pericardial silhouette is enlarged. Patient is status post median sternotomy.  IMPRESSION: Stable to slight decrease in the small left pleural effusion with associated left basilar atelectasis.  No change in the tiny right pleural effusion.   Electronically Signed   By: Misty Stanley M.D.   On: 01/22/2014 14:12      Recent Labs: Lab Results  Component Value Date   WBC 6.3 01/16/2014   HGB 11.3* 01/16/2014   HCT 35.0* 01/16/2014   PLT 373 01/16/2014   GLUCOSE 98 01/16/2014   ALT 13 01/14/2014   AST 15 01/14/2014   NA 141 01/16/2014   K 3.7 01/16/2014   CL 99 01/16/2014   CREATININE 1.03 01/16/2014   BUN 26* 01/16/2014   CO2 29 01/16/2014   TSH 0.385 10/17/2013   INR 1.10 01/14/2014   HGBA1C 5.5 10/06/2013      Assessment / Plan:   Two nylon sutures removed without problems. Patient has an appointment to see Dr. Haroldine Laws this week. . I gave the patient a prescription for Ultram (330, no refills). He is to take one tablet  (50 mg) every 6 hours PRN pain.Patient's daughter informed that if output decreases to 200-250 cc for more than one drainage, contact the office so that the drainage schedule may be changed to every other day. He will return to see Dr. Prescott Gum next Thursday and a CXR will be obtained  Salif Tay M PA-C 01/22/2014 3:01 PM

## 2014-01-23 ENCOUNTER — Ambulatory Visit (HOSPITAL_COMMUNITY)
Admission: RE | Admit: 2014-01-23 | Discharge: 2014-01-23 | Disposition: A | Discharge status: Home / Self Care | Payer: Medicare Other | Source: Ambulatory Visit | Attending: Internal Medicine | Admitting: Internal Medicine

## 2014-01-23 VITALS — BP 116/58 | HR 80 | Wt 212.1 lb

## 2014-01-23 DIAGNOSIS — R634 Abnormal weight loss: Secondary | ICD-10-CM | POA: Insufficient documentation

## 2014-01-23 DIAGNOSIS — J90 Pleural effusion, not elsewhere classified: Secondary | ICD-10-CM | POA: Insufficient documentation

## 2014-01-23 DIAGNOSIS — I5022 Chronic systolic (congestive) heart failure: Secondary | ICD-10-CM | POA: Insufficient documentation

## 2014-01-23 DIAGNOSIS — G4733 Obstructive sleep apnea (adult) (pediatric): Secondary | ICD-10-CM | POA: Insufficient documentation

## 2014-01-23 DIAGNOSIS — F411 Generalized anxiety disorder: Secondary | ICD-10-CM | POA: Insufficient documentation

## 2014-01-23 DIAGNOSIS — I252 Old myocardial infarction: Secondary | ICD-10-CM | POA: Insufficient documentation

## 2014-01-23 DIAGNOSIS — I509 Heart failure, unspecified: Secondary | ICD-10-CM | POA: Insufficient documentation

## 2014-01-23 DIAGNOSIS — I82509 Chronic embolism and thrombosis of unspecified deep veins of unspecified lower extremity: Secondary | ICD-10-CM | POA: Insufficient documentation

## 2014-01-23 DIAGNOSIS — Z951 Presence of aortocoronary bypass graft: Secondary | ICD-10-CM | POA: Insufficient documentation

## 2014-01-23 DIAGNOSIS — I10 Essential (primary) hypertension: Secondary | ICD-10-CM | POA: Insufficient documentation

## 2014-01-23 DIAGNOSIS — Z7901 Long term (current) use of anticoagulants: Secondary | ICD-10-CM | POA: Insufficient documentation

## 2014-01-23 DIAGNOSIS — I2510 Atherosclerotic heart disease of native coronary artery without angina pectoris: Secondary | ICD-10-CM | POA: Insufficient documentation

## 2014-01-23 DIAGNOSIS — I951 Orthostatic hypotension: Secondary | ICD-10-CM | POA: Insufficient documentation

## 2014-01-23 MED ORDER — TORSEMIDE 20 MG PO TABS: 20.0000 mg | ORAL_TABLET | ORAL | Status: DC | PRN

## 2014-01-23 NOTE — Patient Instructions (Signed)
Change Torsemide to AS NEEDED ONLY  We will contact you in 2 months to schedule your next appointment.

## 2014-01-23 NOTE — Addendum Note (Signed)
Encounter addended by: Scarlette Calico, RN on: 01/23/2014  2:29 PM<BR>     Documentation filed: Patient Instructions Section, Orders

## 2014-01-23 NOTE — Progress Notes (Signed)
Patient ID: Antonio Bernard, male   DOB: 1933-06-21, 80 y.o.   MRN: [id number]   Weight Range   Baseline proBNP     HPI: Antonio Bernard is a 80 yo male with a hx of HTN, severe anxiety, OSA , systolic HF due to ICM EF 25% 10/15/2013, S/P CABG x 2 LIMA-LAD; SVG-DIAG on 10/08/13.   On 10/05/2013 had late presentation with anterior MI. Underwent emergent LAD PCI followed by emergent CABG. Postoperative course was complicated by shock and cardiorenal syndrome which required lasix drip, milrinone and dopamine. His post operative course was also complicated by DVT in his R peroneal vein. As Antonio Bernard improved all drips were weaned off and Antonio Bernard was transferred to CIR on 10/24/13. Antonio Bernard was later transferred back to acute care with volme overload and low output. Placed back on Milrinone and lasix drip. Later this was weaned off. Course also c/b orthostatic hypotension. EF 25%. Discharged home with LifeVest.  Antonio Bernard was readmitted 11/14/13 and discharged the next day after being treated for diarrhea. Negative for Cdiff. Given immodium. Diuretics were held. Diuretics restarted 11/18/13 but later stopped 11/19/13.    Lifevest stopped in December at his request.   Antonio Bernard underwent Pleurex catheter placement for L pleural effusion on 2/10 by Dr. Prescott Gum. Continues to drain about 900cc per day.   Antonio Bernard returns for follow up. Pleurex catheter continues to drain. Continues to struggle with orthostatic hypotension which is chronic for him. Weight down 11 pounds. Taking torsemide on M,W,F. Off enalapril due to low BPs. 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 No CP. Feels weak.   Labs 11/15/13 K 3.9 Creatinine 1.46   ROS: All systems negative except as listed in HPI, PMH and Problem List.  Past Medical History  Diagnosis Date  . Acid reflux   . Family history of anesthesia complication     daughter had problem waking up  . Pneumonia     hx of  . Sleep apnea     no treatment for, last sleep study over 5 years ago  .  Abdominal aortic aneurysm     found 06/29/12 "small"  . Headache(784.0)   . Herniated vertebral disc     thoracic area  . Cataracts, bilateral     hx of  . H/O Legionnaire's disease     about 24 years ago  . Anxiety     panic attacks   . Claustrophobia     "severe"  . Hypertension     sees  Dr. Alyson Ingles [phone]  . Obesity (BMI 35.0-39.9 without comorbidity) 10/06/2013  . Essential hypertension 10/06/2013  . Anginal pain   . Atrial fibrillation     Postoperative  . Acute renal insufficiency     Postoperative  . Anemia associated with acute blood loss     Postoperative    Current Outpatient Prescriptions  Medication Sig Dispense Refill  . acetaminophen (TYLENOL) 325 MG tablet Take 2 tablets (650 mg total) by mouth every 6 (six) hours as needed for mild pain, fever or headache.      . ALPRAZolam (XANAX) 0.25 MG tablet Take 0.25-1 mg by mouth 4 (four) times daily as needed for anxiety. Take 0.25 mg through the day as needed and take 1mg  at bedtime.      . Ascorbic Acid (VITAMIN C) 1000 MG tablet Take 1,000 mg by mouth daily.      Marland Kitchen aspirin EC 81 MG tablet Take 81 mg by mouth daily.      Marland Kitchen  cholecalciferol (VITAMIN D) 1000 UNITS tablet Take 2,000 Units by mouth daily.      . citalopram (CELEXA) 20 MG tablet Take 1 tablet (20 mg total) by mouth daily.  30 tablet  6  . digoxin (LANOXIN) 0.125 MG tablet Take 1 tablet (0.125 mg total) by mouth daily.  30 tablet  6  . docusate sodium (COLACE) 100 MG capsule Take 200 mg by mouth daily.      . feeding supplement, GLUCERNA SHAKE, (GLUCERNA SHAKE) LIQD Take 237 mLs by mouth 3 (three) times daily between meals as needed (meal replacement).      . Multiple Vitamin (MULTIVITAMIN WITH MINERALS) TABS tablet Take 1 tablet by mouth daily.      . nitroGLYCERIN (NITROSTAT) 0.3 MG SL tablet Place 1 tablet (0.3 mg total) under the tongue every 5 (five) minutes as needed for chest pain.  90 tablet  12  . polyethylene glycol (MIRALAX / GLYCOLAX) packet  Take 17 g by mouth daily as needed for mild constipation.      . potassium chloride SA (K-DUR,KLOR-CON) 20 MEQ tablet Take 1 tablet (20 mEq total) by mouth 3 (three) times a week. Only if Antonio Bernard take torsemide  30 tablet  6  . Rivaroxaban (XARELTO) 20 MG TABS tablet Take 1 tablet (20 mg total) by mouth daily with supper.  30 tablet  6  . torsemide (DEMADEX) 20 MG tablet Take 1 tablet (20 mg total) by mouth 3 (three) times a week.  30 tablet  6  . traMADol (ULTRAM) 50 MG tablet Take 1 tablet (50 mg total) by mouth every 6 (six) hours as needed.  15 tablet  0  . Wheat Dextrin (BENEFIBER PO) Take 10 mLs by mouth 2 (two) times daily.       No current facility-administered medications for this encounter.     PHYSICAL EXAM: Filed Vitals:   01/23/14 1349  BP: 116/58  Pulse: 80  Weight: 212 lb 2 oz (96.219 kg)  SpO2: 98%    General:  Walked to clinic with walker. Dyspneic walking  Into clinic. Daughter present   HEENT: normal Neck: supple. JVP 5-6. Carotids 2+ bilaterally; no bruits. No lymphadenopathy or thryomegaly appreciated. Cor: PMI normal. Regular rate & rhythm. No rubs, gallops or murmurs.. Sternal incision healed.   Lungs: Diminished in LLL Abdomen: soft, nontender, mildly distended. No hepatosplenomegaly. No bruits or masses. Good bowel sounds. Extremities: no cyanosis, clubbing, rash, R and LLE 1-2+edema Neuro: alert & orientedx3, cranial nerves grossly intact. Moves all 4 extremities w/o difficulty. Affect pleasant.   ASSESSMENT & PLAN: 1.Chronic Systolic Heart Failure 81/8299 EF 25%. NYHA III Volume status looks ok. Given orthostasis and weight loss will changed demadex to prn for swelling or weight gain.  Antonio Bernard is not on BB, spiro or Ace due to hypotension. Continue digoxin.    2) CAD with recent anterior STEMI -S/P CABG x 2 10/08/13 LIMA-LAD; SVG-DIAG on 10/08/13 . No evidence of ischemia. Unfortunately cannot tolerate lipitor as well. Completed [facility] HHPT. We have referred to CR at  [facility].  3) Severe Anxiety - on chronic Xanax 3-4 times a day at home and celexa 20 mg daily.     4) RLE DVT-- Continue Xarelto 20 mg daily   5) L Pleural Effusion - S/P Pleurex catheter on L. Continues to drain. May need to consider pleurodesis but this would t some risk.   Grabiel Schmutz,MD  2:10 PM

## 2014-01-27 ENCOUNTER — Other Ambulatory Visit: Payer: Self-pay | Admitting: *Deleted

## 2014-01-27 DIAGNOSIS — J90 Pleural effusion, not elsewhere classified: Secondary | ICD-10-CM

## 2014-01-30 ENCOUNTER — Ambulatory Visit: Payer: Medicare Other | Admitting: Cardiothoracic Surgery

## 2014-01-31 ENCOUNTER — Ambulatory Visit: Payer: Medicare Other | Admitting: Cardiothoracic Surgery

## 2014-01-31 DIAGNOSIS — Z48812 Encounter for surgical aftercare following surgery on the circulatory system: Secondary | ICD-10-CM

## 2014-01-31 DIAGNOSIS — I2510 Atherosclerotic heart disease of native coronary artery without angina pectoris: Secondary | ICD-10-CM

## 2014-02-04 ENCOUNTER — Encounter: Payer: Self-pay | Admitting: Cardiothoracic Surgery

## 2014-02-04 ENCOUNTER — Ambulatory Visit
Admission: RE | Admit: 2014-02-04 | Discharge: 2014-02-04 | Disposition: A | Discharge status: Home / Self Care | Payer: PRIVATE HEALTH INSURANCE | Source: Ambulatory Visit | Attending: Cardiothoracic Surgery | Admitting: Cardiothoracic Surgery

## 2014-02-04 ENCOUNTER — Ambulatory Visit (INDEPENDENT_AMBULATORY_CARE_PROVIDER_SITE_OTHER): Payer: PRIVATE HEALTH INSURANCE | Admitting: Cardiothoracic Surgery

## 2014-02-04 VITALS — BP 119/70 | HR 83 | Resp 20 | Ht 73.0 in | Wt 207.0 lb

## 2014-02-04 DIAGNOSIS — I2510 Atherosclerotic heart disease of native coronary artery without angina pectoris: Secondary | ICD-10-CM

## 2014-02-04 DIAGNOSIS — Z951 Presence of aortocoronary bypass graft: Secondary | ICD-10-CM

## 2014-02-04 DIAGNOSIS — J90 Pleural effusion, not elsewhere classified: Secondary | ICD-10-CM

## 2014-02-04 NOTE — Progress Notes (Signed)
PCP is Thressa Sheller, MD Referring Provider is Leonie Man, MD  Chief Complaint  Patient presents with  . Routine Post Op    1 week f/u with CXR Lt Pleural Effusion. drainage amount today was 550cc    HPI: Patient returns for monthly check of left Pleurx catheter Patient has recurrent left pleural effusion from chronic systolic heart failure and is status post CABG after a completed anterior MI Catheter drainage has been daily around 450-500 cc. With this the patient's overall condition has significantly improved with increased gamma and decrease shortness of breath. He is currently not taking any diuretic. The catheter drainage is slightly blood tinged from his Xarelto for DVT . The catheter site is healing.   Past Medical History  Diagnosis Date  . Acid reflux   . Family history of anesthesia complication     daughter had problem waking up  . Pneumonia     hx of  . Sleep apnea     no treatment for, last sleep study over 5 years ago  . Abdominal aortic aneurysm     found 06/29/12 "small"  . Headache(784.0)   . Herniated vertebral disc     thoracic area  . Cataracts, bilateral     hx of  . H/O Legionnaire's disease     about 24 years ago  . Anxiety     panic attacks   . Claustrophobia     "severe"  . Hypertension     sees  Dr. Alyson Ingles [phone]  . Obesity (BMI 35.0-39.9 without comorbidity) 10/06/2013  . Essential hypertension 10/06/2013  . Anginal pain   . Atrial fibrillation     Postoperative  . Acute renal insufficiency     Postoperative  . Anemia associated with acute blood loss     Postoperative    Past Surgical History  Procedure Laterality Date  . Shoulder surgery      left  . Cataract extraction w/ intraocular lens implant      bilateral  . Lumbar laminectomy/decompression microdiscectomy  07/20/2012    Procedure: LUMBAR LAMINECTOMY/DECOMPRESSION MICRODISCECTOMY 1 LEVEL;  Surgeon: Faythe Ghee, MD;  Location: [facility];  Service:  Neurosurgery;  Laterality: N/A;  lumbar four-five left  . Back surgery    . Cardiac catheterization  10/05/2013  . Eye surgery    . Coronary artery bypass graft N/A 10/08/2013    Procedure: CORONARY ARTERY BYPASS GRAFTING (CABG);  Surgeon: Ivin Poot, MD;  Location: [place];  Service: Open Heart Surgery;  Laterality: N/A;  Times 2 using left internal mammary artery and endoscopically harvested left saphenous vein  . Intraoperative transesophageal echocardiogram N/A 10/08/2013    Procedure: INTRAOPERATIVE TRANSESOPHAGEAL ECHOCARDIOGRAM;  Surgeon: Ivin Poot, MD;  Location: [place];  Service: Open Heart Surgery;  Laterality: N/A;  . Chest tube insertion Left 01/14/2014    Procedure: INSERTION PLEURAL DRAINAGE CATHETER;  Surgeon: Ivin Poot, MD;  Location: [place];  Service: Thoracic;  Laterality: Left;    No family history on file.  Social History History  Substance Use Topics  . Smoking status: Former Smoker -- 0.50 packs/day    Types: Cigarettes, Cigars  . Smokeless tobacco: Never Used     Comment: 50 years ago  . Alcohol Use: No    Current Outpatient Prescriptions  Medication Sig Dispense Refill  . acetaminophen (TYLENOL) 325 MG tablet Take 2 tablets (650 mg total) by mouth every 6 (six) hours as needed for mild pain, fever or headache.      Marland Kitchen  ALPRAZolam (XANAX) 0.25 MG tablet Take 0.25-1 mg by mouth 4 (four) times daily as needed for anxiety. Take 0.25 mg through the day as needed and take 1mg  at bedtime.      . Ascorbic Acid (VITAMIN C) 1000 MG tablet Take 1,000 mg by mouth daily.      Marland Kitchen aspirin EC 81 MG tablet Take 81 mg by mouth daily.      . cholecalciferol (VITAMIN D) 1000 UNITS tablet Take 2,000 Units by mouth daily.      . citalopram (CELEXA) 20 MG tablet Take 1 tablet (20 mg total) by mouth daily.  30 tablet  6  . digoxin (LANOXIN) 0.125 MG tablet Take 1 tablet (0.125 mg total) by mouth daily.  30 tablet  6  . docusate sodium (COLACE) 100 MG capsule Take 200 mg by  mouth daily.      . feeding supplement, GLUCERNA SHAKE, (GLUCERNA SHAKE) LIQD Take 237 mLs by mouth 3 (three) times daily between meals as needed (meal replacement).      . Multiple Vitamin (MULTIVITAMIN WITH MINERALS) TABS tablet Take 1 tablet by mouth daily.      . nitroGLYCERIN (NITROSTAT) 0.3 MG SL tablet Place 1 tablet (0.3 mg total) under the tongue every 5 (five) minutes as needed for chest pain.  90 tablet  12  . polyethylene glycol (MIRALAX / GLYCOLAX) packet Take 17 g by mouth daily as needed for mild constipation.      . potassium chloride SA (K-DUR,KLOR-CON) 20 MEQ tablet Take 1 tablet (20 mEq total) by mouth 3 (three) times a week. Only if he take torsemide  30 tablet  6  . Rivaroxaban (XARELTO) 20 MG TABS tablet Take 1 tablet (20 mg total) by mouth daily with supper.  30 tablet  6  . torsemide (DEMADEX) 20 MG tablet Take 1 tablet (20 mg total) by mouth as needed.  30 tablet  6  . traMADol (ULTRAM) 50 MG tablet Take 1 tablet (50 mg total) by mouth every 6 (six) hours as needed.  15 tablet  0  . Wheat Dextrin (BENEFIBER PO) Take 10 mLs by mouth 2 (two) times daily.       No current facility-administered medications for this visit.    Allergies  Allergen Reactions  . Demerol [Meperidine] Other (See Comments)    "makes me crazy"  . Lisinopril Diarrhea  . Shellfish Allergy Rash    Review of Systems alert and comfortable appears the best he has felt in several weeks  BP 119/70  Pulse 83  Resp 20  Ht 6\' 1"  (1.854 m)  Wt 207 lb (93.895 kg)  BMI 27.32 kg/m2  SpO2 96% Physical Exam Alert and comfortable Lungs clear Suture removed from Pleurx catheter site-- site healing well Heart rhythm regular without gallop  Diagnostic Tests: Lungs clear, left catheter in good position, no significant pleural effusion  Impression: Continue daily Pleurx drainage schedule When drainage is less than 250 cc change schedule to alternate days  Plan: Return in one month with chest x-ray  for Pleurx catheter check

## 2014-02-05 ENCOUNTER — Ambulatory Visit: Payer: Medicare (Managed Care) | Admitting: Cardiothoracic Surgery

## 2014-02-07 ENCOUNTER — Ambulatory Visit: Payer: Medicare Other | Admitting: Cardiothoracic Surgery

## 2014-02-25 ENCOUNTER — Encounter (HOSPITAL_COMMUNITY)
Admission: RE | Admit: 2014-02-25 | Discharge: 2014-02-25 | Disposition: A | Discharge status: Home / Self Care | Payer: Medicare Other | Source: Ambulatory Visit | Attending: Internal Medicine | Admitting: Internal Medicine

## 2014-02-25 VITALS — BP 120/62 | HR 77 | Ht 73.0 in | Wt 211.4 lb

## 2014-02-25 DIAGNOSIS — I5022 Chronic systolic (congestive) heart failure: Secondary | ICD-10-CM | POA: Insufficient documentation

## 2014-02-25 DIAGNOSIS — I509 Heart failure, unspecified: Secondary | ICD-10-CM | POA: Insufficient documentation

## 2014-02-25 DIAGNOSIS — Z951 Presence of aortocoronary bypass graft: Secondary | ICD-10-CM

## 2014-02-25 DIAGNOSIS — I219 Acute myocardial infarction, unspecified: Secondary | ICD-10-CM | POA: Insufficient documentation

## 2014-02-25 DIAGNOSIS — Z9889 Other specified postprocedural states: Secondary | ICD-10-CM | POA: Insufficient documentation

## 2014-02-25 DIAGNOSIS — I10 Essential (primary) hypertension: Secondary | ICD-10-CM | POA: Insufficient documentation

## 2014-02-25 DIAGNOSIS — Z5189 Encounter for other specified aftercare: Secondary | ICD-10-CM | POA: Insufficient documentation

## 2014-02-25 DIAGNOSIS — I213 ST elevation (STEMI) myocardial infarction of unspecified site: Secondary | ICD-10-CM

## 2014-02-25 NOTE — Progress Notes (Addendum)
Patient was referred to CR by Dr. Modesta Messing due to MI 410.90 and CABGx2 V45.81.Marland Kitchen  During orientation advised patient on arrival and appointment times what to wear, what to do before, during and after exercise. Reviewed attendance and class policy. Talked about inclement weather and class consultation policy. Pt is scheduled to start Cardiac Rehab on 03/03/14 at 11 am. Pt was advised to come to class 5 minutes before class starts. He was also given instructions on meeting with the dietician and attending the Family Structure classes. Pt is eager to get started. Patient was unable to complete 6 minute walk test due to dizziness. After resting patient was not dizzie and was not SOB.

## 2014-02-25 NOTE — Patient Instructions (Signed)
Pt has finished orientation and is scheduled to start CR on 03/03/14 at 11 am. Pt has been instructed to arrive to class 15 minutes early for scheduled class. Pt has been instructed to wear comfortable clothing and shoes with rubber soles. Pt has been told to take their medications 1 hour prior to coming to class.  If the patient is not going to attend class, he/she has been instructed to call.

## 2014-03-03 ENCOUNTER — Encounter (HOSPITAL_COMMUNITY)
Admission: RE | Admit: 2014-03-03 | Discharge: 2014-03-03 | Disposition: A | Discharge status: Home / Self Care | Payer: Medicare Other | Source: Ambulatory Visit | Attending: Internal Medicine | Admitting: Internal Medicine

## 2014-03-03 ENCOUNTER — Other Ambulatory Visit: Payer: Self-pay | Admitting: Physician Assistant

## 2014-03-05 ENCOUNTER — Encounter (HOSPITAL_COMMUNITY)
Admission: RE | Admit: 2014-03-05 | Discharge: 2014-03-05 | Disposition: A | Discharge status: Home / Self Care | Payer: Medicare Other | Source: Ambulatory Visit | Attending: Internal Medicine | Admitting: Internal Medicine

## 2014-03-05 DIAGNOSIS — Z9889 Other specified postprocedural states: Secondary | ICD-10-CM | POA: Insufficient documentation

## 2014-03-05 DIAGNOSIS — I509 Heart failure, unspecified: Secondary | ICD-10-CM | POA: Insufficient documentation

## 2014-03-05 DIAGNOSIS — I10 Essential (primary) hypertension: Secondary | ICD-10-CM | POA: Insufficient documentation

## 2014-03-05 DIAGNOSIS — I219 Acute myocardial infarction, unspecified: Secondary | ICD-10-CM | POA: Insufficient documentation

## 2014-03-05 DIAGNOSIS — I5022 Chronic systolic (congestive) heart failure: Secondary | ICD-10-CM | POA: Insufficient documentation

## 2014-03-05 DIAGNOSIS — Z5189 Encounter for other specified aftercare: Secondary | ICD-10-CM | POA: Insufficient documentation

## 2014-03-07 ENCOUNTER — Encounter (HOSPITAL_COMMUNITY)
Admission: RE | Admit: 2014-03-07 | Discharge: 2014-03-07 | Disposition: A | Discharge status: Home / Self Care | Payer: Medicare Other | Source: Ambulatory Visit | Attending: Internal Medicine | Admitting: Internal Medicine

## 2014-03-10 ENCOUNTER — Encounter (HOSPITAL_COMMUNITY)
Admission: RE | Admit: 2014-03-10 | Discharge: 2014-03-10 | Disposition: A | Discharge status: Home / Self Care | Payer: Medicare Other | Source: Ambulatory Visit | Attending: Internal Medicine | Admitting: Internal Medicine

## 2014-03-10 ENCOUNTER — Other Ambulatory Visit: Payer: Self-pay | Admitting: *Deleted

## 2014-03-10 DIAGNOSIS — J90 Pleural effusion, not elsewhere classified: Secondary | ICD-10-CM

## 2014-03-12 ENCOUNTER — Other Ambulatory Visit: Payer: Self-pay | Admitting: *Deleted

## 2014-03-12 ENCOUNTER — Other Ambulatory Visit: Payer: Self-pay | Admitting: Cardiothoracic Surgery

## 2014-03-12 ENCOUNTER — Ambulatory Visit
Admission: RE | Admit: 2014-03-12 | Discharge: 2014-03-12 | Disposition: A | Discharge status: Home / Self Care | Payer: PRIVATE HEALTH INSURANCE | Source: Ambulatory Visit | Attending: Cardiothoracic Surgery | Admitting: Cardiothoracic Surgery

## 2014-03-12 ENCOUNTER — Encounter (HOSPITAL_COMMUNITY): Payer: Medicare Other

## 2014-03-12 ENCOUNTER — Encounter: Payer: Self-pay | Admitting: Cardiothoracic Surgery

## 2014-03-12 ENCOUNTER — Ambulatory Visit (INDEPENDENT_AMBULATORY_CARE_PROVIDER_SITE_OTHER): Payer: PRIVATE HEALTH INSURANCE | Admitting: Cardiothoracic Surgery

## 2014-03-12 ENCOUNTER — Ambulatory Visit
Admission: RE | Admit: 2014-03-12 | Discharge: 2014-03-12 | Disposition: A | Discharge status: Home / Self Care | Payer: Medicare Other | Source: Ambulatory Visit | Attending: Cardiothoracic Surgery | Admitting: Cardiothoracic Surgery

## 2014-03-12 VITALS — HR 87 | Resp 20 | Ht 73.0 in | Wt 211.0 lb

## 2014-03-12 DIAGNOSIS — J90 Pleural effusion, not elsewhere classified: Secondary | ICD-10-CM

## 2014-03-12 DIAGNOSIS — R52 Pain, unspecified: Secondary | ICD-10-CM

## 2014-03-12 DIAGNOSIS — R519 Headache, unspecified: Secondary | ICD-10-CM

## 2014-03-12 DIAGNOSIS — R51 Headache: Principal | ICD-10-CM

## 2014-03-12 DIAGNOSIS — R0789 Other chest pain: Secondary | ICD-10-CM

## 2014-03-12 DIAGNOSIS — Z951 Presence of aortocoronary bypass graft: Secondary | ICD-10-CM

## 2014-03-12 LAB — BASIC METABOLIC PANEL
BUN: 26 mg/dL — ABNORMAL HIGH (ref 6–23)
CO2: 28 mEq/L (ref 19–32)
Calcium: 9 mg/dL (ref 8.4–10.5)
Chloride: 101 mEq/L (ref 96–112)
Creat: 1.2 mg/dL (ref 0.50–1.35)
Glucose, Bld: 91 mg/dL (ref 70–99)
Potassium: 4.2 mEq/L (ref 3.5–5.3)
Sodium: 137 mEq/L (ref 135–145)

## 2014-03-12 NOTE — Progress Notes (Signed)
PCP is Thressa Sheller, MD Referring Provider is Leonie Man, MD  Chief Complaint  Patient presents with  . Routine Post Op    1 month f/u with CXR    HPI: Return routine monthly followup of his Pleurx catheter. A left-sided post CABG pleural effusion recurred and was the indication for the Pleurx catheter. The patient sustained a severe MI with CPK-MB >400. He subsequently had CABG.His EF is 20-25% and he is followed and advanced heart failure clinic.  The Pleurx catheter is helping his fluid management he is currently on no diuretics. He drains approximately 300 cc daily from the left Pleurx catheter. This is clear xanthochromic fluid.  Last night at home he got up from bed to the bathroom and fell on the bathroom floor. He woke up after an unknown interval and call for his daughter. He had some bruising over his left shoulder some swelling of his left wrist. He had some bleeding as well since he is on a 10 A. inhibitor has had previous episodes of orthostatic dizziness but no falls.  The catheters drained in the office today returns 325 cc Chest x-ray in the office prior to drainage shows a small left pleural effusion blunting of the costophrenic angle, no significant right pleural effusion  Past Medical History  Diagnosis Date  . Acid reflux   . Family history of anesthesia complication     daughter had problem waking up  . Pneumonia     hx of  . Sleep apnea     no treatment for, last sleep study over 5 years ago  . Abdominal aortic aneurysm     found 06/29/12 "small"  . Headache(784.0)   . Herniated vertebral disc     thoracic area  . Cataracts, bilateral     hx of  . H/O Legionnaire's disease     about 24 years ago  . Anxiety     panic attacks   . Claustrophobia     "severe"  . Hypertension     sees  Dr. Alyson Ingles [phone]  . Obesity (BMI 35.0-39.9 without comorbidity) 10/06/2013  . Essential hypertension 10/06/2013  . Anginal pain   . Atrial fibrillation    Postoperative  . Acute renal insufficiency     Postoperative  . Anemia associated with acute blood loss     Postoperative    Past Surgical History  Procedure Laterality Date  . Shoulder surgery      left  . Cataract extraction w/ intraocular lens implant      bilateral  . Lumbar laminectomy/decompression microdiscectomy  07/20/2012    Procedure: LUMBAR LAMINECTOMY/DECOMPRESSION MICRODISCECTOMY 1 LEVEL;  Surgeon: Faythe Ghee, MD;  Location: [facility];  Service: Neurosurgery;  Laterality: N/A;  lumbar four-five left  . Back surgery    . Cardiac catheterization  10/05/2013  . Eye surgery    . Coronary artery bypass graft N/A 10/08/2013    Procedure: CORONARY ARTERY BYPASS GRAFTING (CABG);  Surgeon: Ivin Poot, MD;  Location: [place];  Service: Open Heart Surgery;  Laterality: N/A;  Times 2 using left internal mammary artery and endoscopically harvested left saphenous vein  . Intraoperative transesophageal echocardiogram N/A 10/08/2013    Procedure: INTRAOPERATIVE TRANSESOPHAGEAL ECHOCARDIOGRAM;  Surgeon: Ivin Poot, MD;  Location: [place];  Service: Open Heart Surgery;  Laterality: N/A;  . Chest tube insertion Left 01/14/2014    Procedure: INSERTION PLEURAL DRAINAGE CATHETER;  Surgeon: Ivin Poot, MD;  Location: [place];  Service: Thoracic;  Laterality: Left;    No family history on file.  Social History History  Substance Use Topics  . Smoking status: Former Smoker -- 0.50 packs/day    Types: Cigarettes, Cigars  . Smokeless tobacco: Never Used     Comment: 50 years ago  . Alcohol Use: No    Current Outpatient Prescriptions  Medication Sig Dispense Refill  . acetaminophen (TYLENOL) 325 MG tablet Take 2 tablets (650 mg total) by mouth every 6 (six) hours as needed for mild pain, fever or headache.      . ALPRAZolam (XANAX) 0.25 MG tablet Take 0.25-1 mg by mouth 4 (four) times daily as needed for anxiety. Take 0.25 mg through the day as needed and take 1mg  at bedtime.       . Ascorbic Acid (VITAMIN C) 1000 MG tablet Take 1,000 mg by mouth daily.      Marland Kitchen aspirin EC 81 MG tablet Take 81 mg by mouth daily.      . cholecalciferol (VITAMIN D) 1000 UNITS tablet Take 2,000 Units by mouth daily.      . citalopram (CELEXA) 20 MG tablet Take 1 tablet (20 mg total) by mouth daily.  30 tablet  6  . digoxin (LANOXIN) 0.125 MG tablet Take 1 tablet (0.125 mg total) by mouth daily.  30 tablet  6  . docusate sodium (COLACE) 100 MG capsule Take 200 mg by mouth daily.      . feeding supplement, GLUCERNA SHAKE, (GLUCERNA SHAKE) LIQD Take 237 mLs by mouth 3 (three) times daily between meals as needed (meal replacement).      . Multiple Vitamin (MULTIVITAMIN WITH MINERALS) TABS tablet Take 1 tablet by mouth daily.      . nitroGLYCERIN (NITROSTAT) 0.3 MG SL tablet Place 1 tablet (0.3 mg total) under the tongue every 5 (five) minutes as needed for chest pain.  90 tablet  12  . polyethylene glycol (MIRALAX / GLYCOLAX) packet Take 17 g by mouth daily as needed for mild constipation.      . potassium chloride SA (K-DUR,KLOR-CON) 20 MEQ tablet Take 1 tablet (20 mEq total) by mouth 3 (three) times a week. Only if he take torsemide  30 tablet  6  . Rivaroxaban (XARELTO) 20 MG TABS tablet Take 1 tablet (20 mg total) by mouth daily with supper.  30 tablet  6  . torsemide (DEMADEX) 20 MG tablet Take 1 tablet (20 mg total) by mouth as needed.  30 tablet  6  . traMADol (ULTRAM) 50 MG tablet TAKE 1 TABLET BY MOUTH EVERY 6 HOURS DAILY AS NEEDED FOR PAIN  30 tablet  0  . Wheat Dextrin (BENEFIBER PO) Take 10 mLs by mouth 2 (two) times daily.       No current facility-administered medications for this visit.    Allergies  Allergen Reactions  . Demerol [Meperidine] Other (See Comments)    "makes me crazy"  . Lisinopril Diarrhea  . Shellfish Allergy Rash    Review of Systems no fever, Pleurx catheter site without infection  Pulse 87  Resp 20  Ht 6\' 1"  (1.854 m)  Wt 211 lb (95.709 kg)  BMI  27.84 kg/m2  SpO2 95% Physical Exam Alert and comfortable and oriented No focal neural -logic changes Lungs clear Heart rate regular without murmur No pedal edema  Diagnostic Tests: After the visit the patient had a head CT scan which showed no evidence of hematoma fracture or intracerebral bleed Left wrist was negative for fracture Electrolytes were checked  which showed mild dehydration BUN 26 creatinine 1.2, no change from one month ago. Results found to patient's daughter, Langley Gauss.  Impression: Continue daily Pleurx drainage. He will be seen in the advanced heart failure clinic later this week for his orthostatic dizziness. He is currently taking no blood pressure medications and no diuretic.   Plan:Return for one month for Pleurx catheter check.

## 2014-03-14 ENCOUNTER — Encounter (HOSPITAL_COMMUNITY): Payer: Medicare Other

## 2014-03-14 ENCOUNTER — Encounter (HOSPITAL_COMMUNITY): Payer: Self-pay

## 2014-03-14 ENCOUNTER — Ambulatory Visit (HOSPITAL_COMMUNITY)
Admission: RE | Admit: 2014-03-14 | Discharge: 2014-03-14 | Disposition: A | Discharge status: Home / Self Care | Payer: Medicare Other | Source: Ambulatory Visit | Attending: Internal Medicine | Admitting: Internal Medicine

## 2014-03-14 VITALS — BP 100/58 | HR 77 | Wt 213.4 lb

## 2014-03-14 DIAGNOSIS — I5022 Chronic systolic (congestive) heart failure: Secondary | ICD-10-CM | POA: Insufficient documentation

## 2014-03-14 DIAGNOSIS — R55 Syncope and collapse: Secondary | ICD-10-CM | POA: Insufficient documentation

## 2014-03-14 DIAGNOSIS — Z951 Presence of aortocoronary bypass graft: Secondary | ICD-10-CM | POA: Insufficient documentation

## 2014-03-14 MED ORDER — TORSEMIDE 20 MG PO TABS: 10.0000 mg | ORAL_TABLET | ORAL | Status: DC

## 2014-03-14 MED ORDER — POTASSIUM CHLORIDE CRYS ER 20 MEQ PO TBCR: 10.0000 meq | EXTENDED_RELEASE_TABLET | ORAL | Status: DC

## 2014-03-14 NOTE — Patient Instructions (Signed)
Take Torsemide 10 mg (1/2 tab) every other day  Take Potassium 10 meq (1/2 tab) every other day   Please wear compression stockings during the day, we have provided you with a prescription  Your physician has requested that you have an echocardiogram. Echocardiography is a painless test that uses sound waves to create images of your heart. It provides your doctor with information about the size and shape of your heart and how well your heart's chambers and valves are working. This procedure takes approximately one hour. There are no restrictions for this procedure.  Your physician recommends that you schedule a follow-up appointment in: 2 weeks with labs

## 2014-03-14 NOTE — Progress Notes (Signed)
Patient ID: Antonio Bernard, male   DOB: 05-02-1933, 80 y.o.   MRN: [id number] PCP: Dr. Noah Delaine Primary Cardiologist: Dr. Ellyn Hack Surgeon: Dr. Darcey Nora  HPI: Antonio Bernard is a 80 yo male with a hx of HTN, severe anxiety, OSA , systolic HF due to ICM EF 25% 10/15/2013, S/P CABG x 2 LIMA-LAD; SVG-DIAG on 10/08/13.   On 10/05/2013 had late presentation with anterior MI. Underwent emergent LAD PCI followed by emergent CABG. Postoperative course was complicated by shock and cardiorenal syndrome which required lasix drip, milrinone and dopamine. His post operative course was also complicated by DVT in his R peroneal vein. As he improved all drips were weaned off and he was transferred to CIR on 10/24/13. He was later transferred back to acute care with volme overload and low output. Placed back on Milrinone and lasix drip. Later this was weaned off. Course also c/b orthostatic hypotension. EF 25%. Discharged home with LifeVest.  He was readmitted 11/14/13 and discharged the next day after being treated for diarrhea. Negative for Cdiff. Given immodium. Diuretics were held. Diuretics restarted 11/18/13 but later stopped 11/19/13.    Lifevest stopped in December at his request.  Echo in 1/15 with EF 35-40%.  He underwent Pleurex catheter placement for L pleural effusion in 2/15 by Dr. Prescott Gum. Now draining about 300 cc/day.   Acute Visit: On 4/7, patient got out of bed at about 1 am to go to the bathroom.  He had taken torsemide 20 mg that evening and had to get up and down a lot to urinate.  He walked to the bathroom, got lightheaded, and fell to the floor, hitting his head and cutting his arm.  He did not actually pass out.  Head CT was negative for acute abnormality.  He has been having lightheadedness with standing frequently.  It is not as bad if he stands slowly and remains in one place for a while before starting to walk.  He is using torsemide only rarely.  Weight is up 1 lb.  He is more short of  breath, now dyspneic just walking in the house.  His ankles are swelling and his pants are tight.    I checked orthostatics in the office today and he was not orthostatic. He is not on a statin due to myalgias.     Labs 11/15/13 K 3.9 Creatinine 1.46, digoxin 0.6 Labs 03/12/14: K+ 4.2, Creatinine 1.2  ROS: All systems negative except as listed in HPI, PMH and Problem List.  Past Medical History  Diagnosis Date  . Acid reflux   . Family history of anesthesia complication     daughter had problem waking up  . Pneumonia     hx of  . Sleep apnea     no treatment for, last sleep study over 5 years ago  . Abdominal aortic aneurysm     found 06/29/12 "small"  . Headache(784.0)   . Herniated vertebral disc     thoracic area  . Cataracts, bilateral     hx of  . H/O Legionnaire's disease     about 24 years ago  . Anxiety     panic attacks   . Claustrophobia     "severe"  . Hypertension     sees  Dr. Alyson Ingles [phone]  . Obesity (BMI 35.0-39.9 without comorbidity) 10/06/2013  . Essential hypertension 10/06/2013  . Anginal pain   . Atrial fibrillation     Postoperative  . Acute renal insufficiency  Postoperative  . Anemia associated with acute blood loss     Postoperative    Current Outpatient Prescriptions  Medication Sig Dispense Refill  . acetaminophen (TYLENOL) 325 MG tablet Take 2 tablets (650 mg total) by mouth every 6 (six) hours as needed for mild pain, fever or headache.      . ALPRAZolam (XANAX) 0.25 MG tablet Take 0.25-1 mg by mouth 4 (four) times daily as needed for anxiety. Take 0.25 mg through the day as needed and take 1mg  at bedtime.      . Ascorbic Acid (VITAMIN C) 1000 MG tablet Take 1,000 mg by mouth daily.      Marland Kitchen aspirin EC 81 MG tablet Take 81 mg by mouth daily.      . cholecalciferol (VITAMIN D) 1000 UNITS tablet Take 2,000 Units by mouth daily.      . citalopram (CELEXA) 20 MG tablet Take 1 tablet (20 mg total) by mouth daily.  30 tablet  6  . digoxin  (LANOXIN) 0.125 MG tablet Take 1 tablet (0.125 mg total) by mouth daily.  30 tablet  6  . docusate sodium (COLACE) 100 MG capsule Take 200 mg by mouth daily.      . feeding supplement, GLUCERNA SHAKE, (GLUCERNA SHAKE) LIQD Take 237 mLs by mouth 3 (three) times daily between meals as needed (meal replacement).      . Multiple Vitamin (MULTIVITAMIN WITH MINERALS) TABS tablet Take 1 tablet by mouth daily.      . nitroGLYCERIN (NITROSTAT) 0.3 MG SL tablet Place 1 tablet (0.3 mg total) under the tongue every 5 (five) minutes as needed for chest pain.  90 tablet  12  . polyethylene glycol (MIRALAX / GLYCOLAX) packet Take 17 g by mouth daily as needed for mild constipation.      . potassium chloride SA (K-DUR,KLOR-CON) 20 MEQ tablet Take 1 tablet (20 mEq total) by mouth 3 (three) times a week. Only if he take torsemide  30 tablet  6  . Rivaroxaban (XARELTO) 20 MG TABS tablet Take 1 tablet (20 mg total) by mouth daily with supper.  30 tablet  6  . torsemide (DEMADEX) 20 MG tablet Take 1 tablet (20 mg total) by mouth as needed.  30 tablet  6  . traMADol (ULTRAM) 50 MG tablet TAKE 1 TABLET BY MOUTH EVERY 6 HOURS DAILY AS NEEDED FOR PAIN  30 tablet  0  . Wheat Dextrin (BENEFIBER PO) Take 10 mLs by mouth 2 (two) times daily.       No current facility-administered medications for this encounter.    Filed Vitals:   03/14/14 0952  BP: 108/56  Pulse: 82  Weight: 213 lb 6.4 oz (96.798 kg)  SpO2: 96%   PHYSICAL EXAM: General:  Walked to clinic with walker. Dyspneic walking  Into clinic. Daughter present   HEENT: normal Neck: supple. JVP 8-9 cm. Carotids 2+ bilaterally; no bruits. No lymphadenopathy or thryomegaly appreciated. Cor: PMI normal. Regular rate & rhythm. No rubs, gallops or murmurs.. Sternal incision healed.   Lungs: Bronchial breath sounds right base.   Abdomen: soft, nontender, mildly distended. No hepatosplenomegaly. No bruits or masses. Good bowel sounds. Extremities: no cyanosis, clubbing,  rash, 1+ ankle edema bilaterally.  Neuro: alert & orientedx3, cranial nerves grossly intact. Moves all 4 extremities w/o difficulty. Affect pleasant.  ASSESSMENT & PLAN: 1.Chronic Systolic Heart Failure: Echo 10/2013 EF 25%. NYHA III.  Ischemic cardiomyopathy.  Patient looks mildly volume overloaded with increased dyspnea.   -  Given  significant difficulty with lightheadedness, I am going to start him on torsemide 10 mg every other day with KCl 10 mEq every other day.  He should take the torsemide in the morning rather than in the evening to avoid having to get up more frequently at night.   BMET/BNP/digoxin level/TSH at followup in 2 wks.  - He is not on BB, spironolactone or ACEI due to hypotension. Continue digoxin (level in 12/14 was ok).  - Echo to look for improvement in EF post-CABG.   2) CAD: Recent anterior STEMI, S/P CABG x 2 10/08/13 LIMA-LAD; SVG-DIAG on 10/08/13 . No chest pain. Myalgias with atorvastatin.  He is quite reticent to try another statin.  3) Anxiety: Significant.  On chronic Xanax 3-4 times a day at home and celexa 20 mg daily.    4) RLE DVT: Post-op.  Continue Xarelto 20 mg daily until the end of 5/15.    5) L Pleural Effusion: S/P Pleurex catheter on L. Continues to drain but less.  6) Lightheadedness/presyncope: This continues to be a significant problem.  Patient is lightheaded with standing, with significant fall risk.  He was not orthostatic when checked today.   - I would like him to wear compression stockings while awake.   - Always take torsemide in the am to avoid getting up more at night.  Would also keep a bedside commode.   - If this continues to be a problem, would consider trial of midodrine at low dose to help him avoid falls.   Antonio Bernard 03/16/2014

## 2014-03-17 ENCOUNTER — Encounter (HOSPITAL_COMMUNITY): Payer: Medicare Other

## 2014-03-18 ENCOUNTER — Telehealth (HOSPITAL_COMMUNITY): Payer: Self-pay | Admitting: Cardiology

## 2014-03-18 NOTE — Telephone Encounter (Signed)
CALL FROM DAUGHTER WITH CONCERNS PT WAS UP ALL NIGHT WITH BED SORE- PT SLEEPS AND SITS IN RECLINER THROUGH OUT THE DAY PT ALSO C.O COUGH AND CONGESTION  WITH YELLOWISH SPUTUM INCREASED WEAKNESS NO WEIGHT AVAILABLE FOR PT TODAY PT HAS RESTARTED DIURETICS AS ORDERED (TORSEMIDE 10 MG sAT 4/11 AND mON 4/13)  PER VO AMY CLEGG, NP PT SHOULD FOLLOW UP WITH PCP FOR FURTHER EVALUATION NEEDS CXR

## 2014-03-19 ENCOUNTER — Encounter (HOSPITAL_COMMUNITY): Payer: Medicare Other

## 2014-03-20 ENCOUNTER — Encounter (HOSPITAL_COMMUNITY): Payer: Medicare Other

## 2014-03-21 ENCOUNTER — Encounter (HOSPITAL_COMMUNITY): Payer: Medicare Other

## 2014-03-24 ENCOUNTER — Encounter (HOSPITAL_COMMUNITY): Payer: Medicare Other

## 2014-03-26 ENCOUNTER — Encounter (HOSPITAL_COMMUNITY)
Admission: RE | Admit: 2014-03-26 | Discharge: 2014-03-26 | Disposition: A | Discharge status: Home / Self Care | Payer: Medicare Other | Source: Ambulatory Visit | Attending: Internal Medicine | Admitting: Internal Medicine

## 2014-03-26 ENCOUNTER — Encounter (HOSPITAL_COMMUNITY): Payer: Medicare Other

## 2014-03-27 ENCOUNTER — Ambulatory Visit (HOSPITAL_BASED_OUTPATIENT_CLINIC_OR_DEPARTMENT_OTHER)
Admission: RE | Admit: 2014-03-27 | Discharge: 2014-03-27 | Disposition: A | Discharge status: Home / Self Care | Payer: Medicare Other | Source: Ambulatory Visit | Attending: Internal Medicine | Admitting: Internal Medicine

## 2014-03-27 ENCOUNTER — Ambulatory Visit (HOSPITAL_COMMUNITY)
Admission: RE | Admit: 2014-03-27 | Discharge: 2014-03-27 | Disposition: A | Discharge status: Home / Self Care | Payer: Medicare Other | Source: Ambulatory Visit | Attending: Internal Medicine | Admitting: Internal Medicine

## 2014-03-27 VITALS — BP 144/80 | HR 81

## 2014-03-27 DIAGNOSIS — R5381 Other malaise: Secondary | ICD-10-CM | POA: Insufficient documentation

## 2014-03-27 DIAGNOSIS — I2510 Atherosclerotic heart disease of native coronary artery without angina pectoris: Secondary | ICD-10-CM

## 2014-03-27 DIAGNOSIS — I5022 Chronic systolic (congestive) heart failure: Secondary | ICD-10-CM | POA: Insufficient documentation

## 2014-03-27 DIAGNOSIS — I059 Rheumatic mitral valve disease, unspecified: Secondary | ICD-10-CM

## 2014-03-27 DIAGNOSIS — R5383 Other fatigue: Secondary | ICD-10-CM

## 2014-03-27 DIAGNOSIS — J90 Pleural effusion, not elsewhere classified: Secondary | ICD-10-CM

## 2014-03-27 MED ORDER — PERFLUTREN LIPID MICROSPHERE
1.0000 mL | INTRAVENOUS | Status: AC | PRN
  Administered 2014-03-27: 10 mL via INTRAVENOUS

## 2014-03-27 NOTE — Progress Notes (Signed)
  Echocardiogram 2D Echocardiogram (with Definity) has been performed.  Antonio Bernard 03/27/2014, 3:47 PM

## 2014-03-27 NOTE — Patient Instructions (Signed)
Continue demadex 10mg  on tues, Thurs and Saturday. Hold for weight 205 pounds or less.   Drain Pleurex catheter every other day. If drainage > 500cc go back to daily drainage.   Return to clinic 1 month.

## 2014-03-27 NOTE — Progress Notes (Signed)
Patient ID: [name], male   DOB: 02-May-1933, 80 y.o.   MRN: [id number] PCP: Dr. Noah Delaine Primary Cardiologist: Dr. Ellyn Hack Surgeon: Dr. Darcey Nora  HPI: Mr. Hoose is a 80 yo male with a hx of HTN, severe anxiety, OSA , systolic HF due to ICM EF 25% 10/15/2013, S/P CABG x 2 LIMA-LAD; SVG-DIAG on 10/08/13.   On 10/05/2013 had late presentation with anterior MI. Underwent emergent LAD PCI followed by emergent CABG. Postoperative course was complicated by shock and cardiorenal syndrome which required lasix drip, milrinone and dopamine. His post operative course was also complicated by DVT in his R peroneal vein. As he improved all drips were weaned off and he was transferred to CIR on 10/24/13. He was later transferred back to acute care with volme overload and low output. Placed back on Milrinone and lasix drip. Later this was weaned off. Course also c/b orthostatic hypotension. EF 25%. Discharged home with LifeVest.  Lifevest stopped in December at his request.  Echo in 1/15 with EF 35-40%.  He underwent Pleurex catheter placement for L pleural effusion in 2/15 by Dr. Prescott Gum. Now draining about 200-300 cc/day.   Follow-up:  Saw Dr. Aundra Dubin about two weeks ago after a presyncopal episode.  Torsemide cut back to 10mg  on T, Th, Sat. Since that time developed URI and was treated with Augmentin but developed bloody diarrhea so that was stopped. He also stopped Xarelto. Now starting to feel better. Has returned to cardiac rehab. SBP running 100-120. Weight stable at 207. No lightheadedness or presyncope. Denies CP. Still weak. No edema. Pleurex drainage decreased to 200-300/day.   Echo today EF 20-25% mild MR. RV ok. No apical clot.   I checked orthostatics in the office today and he was not orthostatic. He is not on a statin due to myalgias.     Labs 11/15/13 K 3.9 Creatinine 1.46, digoxin 0.6 Labs 03/12/14: K+ 4.2, Creatinine 1.2  ROS: All systems negative except as listed in HPI, PMH and  Problem List.  Past Medical History  Diagnosis Date  . Acid reflux   . Family history of anesthesia complication     daughter had problem waking up  . Pneumonia     hx of  . Sleep apnea     no treatment for, last sleep study over 5 years ago  . Abdominal aortic aneurysm     found 06/29/12 "small"  . Headache(784.0)   . Herniated vertebral disc     thoracic area  . Cataracts, bilateral     hx of  . H/O Legionnaire's disease     about 24 years ago  . Anxiety     panic attacks   . Claustrophobia     "severe"  . Hypertension     sees  Dr. Alyson Ingles [phone]  . Obesity (BMI 35.0-39.9 without comorbidity) 10/06/2013  . Essential hypertension 10/06/2013  . Anginal pain   . Atrial fibrillation     Postoperative  . Acute renal insufficiency     Postoperative  . Anemia associated with acute blood loss     Postoperative    Current Outpatient Prescriptions  Medication Sig Dispense Refill  . acetaminophen (TYLENOL) 325 MG tablet Take 2 tablets (650 mg total) by mouth every 6 (six) hours as needed for mild pain, fever or headache.      . ALPRAZolam (XANAX) 0.25 MG tablet Take 0.25-1 mg by mouth 4 (four) times daily as needed for anxiety. Take 0.25 mg through the day as needed  and take 1mg  at bedtime.      . Ascorbic Acid (VITAMIN C) 1000 MG tablet Take 1,000 mg by mouth daily.      Marland Kitchen aspirin EC 81 MG tablet Take 81 mg by mouth daily.      . cholecalciferol (VITAMIN D) 1000 UNITS tablet Take 2,000 Units by mouth daily.      . citalopram (CELEXA) 20 MG tablet Take 1 tablet (20 mg total) by mouth daily.  30 tablet  6  . digoxin (LANOXIN) 0.125 MG tablet Take 1 tablet (0.125 mg total) by mouth daily.  30 tablet  6  . docusate sodium (COLACE) 100 MG capsule Take 200 mg by mouth daily.      . feeding supplement, GLUCERNA SHAKE, (GLUCERNA SHAKE) LIQD Take 237 mLs by mouth 3 (three) times daily between meals as needed (meal replacement).      . Multiple Vitamin (MULTIVITAMIN WITH MINERALS)  TABS tablet Take 1 tablet by mouth daily.      . nitroGLYCERIN (NITROSTAT) 0.3 MG SL tablet Place 1 tablet (0.3 mg total) under the tongue every 5 (five) minutes as needed for chest pain.  90 tablet  12  . polyethylene glycol (MIRALAX / GLYCOLAX) packet Take 17 g by mouth daily as needed for mild constipation.      . potassium chloride SA (K-DUR,KLOR-CON) 20 MEQ tablet Take 0.5 tablets (10 mEq total) by mouth every other day. Only if he take torsemide  30 tablet  6  . Rivaroxaban (XARELTO) 20 MG TABS tablet Take 1 tablet (20 mg total) by mouth daily with supper.  30 tablet  6  . torsemide (DEMADEX) 20 MG tablet Take 0.5 tablets (10 mg total) by mouth every other day.  30 tablet  6  . traMADol (ULTRAM) 50 MG tablet TAKE 1 TABLET BY MOUTH EVERY 6 HOURS DAILY AS NEEDED FOR PAIN  30 tablet  0  . Wheat Dextrin (BENEFIBER PO) Take 10 mLs by mouth 2 (two) times daily.       Current Facility-Administered Medications  Medication Dose Route Frequency Provider Last Rate Last Dose  . perflutren lipid microspheres (DEFINITY) IV suspension  1-10 mL Intravenous PRN Rande Brunt, NP   10 mL at 03/27/14 1529    Filed Vitals:   03/27/14 1551  BP: 144/80  Pulse: 81   PHYSICAL EXAM: General:  Sitting on exam table. NADDaughter present   HEENT: normal Neck: supple. JVP 7-8 cm. Carotids 2+ bilaterally; no bruits. No lymphadenopathy or thryomegaly appreciated. Cor: PMI normal. Regular rate & rhythm. No rubs, gallops or murmurs.. Sternal incision healed.   Lungs: Clear    Abdomen: soft, nontender, nondistended. No hepatosplenomegaly. No bruits or masses. Good bowel sounds. Pleurex cath ok.  Extremities: no cyanosis, clubbing, rash, tr ankle edema bilaterally.  Neuro: alert & orientedx3, cranial nerves grossly intact. Moves all 4 extremities w/o difficulty. Affect pleasant.  ASSESSMENT & PLAN: 1.Chronic Systolic Heart Failure: Echo 10/2013 EF 25%. NYHA III.  Ischemic cardiomyopathy.  -Volume status looks  good. Continue current regimen torsemide 10mg  T, TH, Sat. Hold if weight 205 or less - He is not on BB, spironolactone or ACEI due to hypotension. Continue digoxin (level in 12/14 was ok).  - EF remains 20-25% on echo. He refuses ICD. 2) CAD: Recent anterior STEMI, S/P CABG x 2 10/08/13 LIMA-LAD; SVG-DIAG on 10/08/13 . No chest pain. Myalgias with atorvastatin.  He is quite reticent to try another statin. Now that he is off Xarelto will start  ASA 81.  3) Anxiety: Significant.  On chronic Xanax 3-4 times a day at home and celexa 20 mg daily.    4) RLE DVT: Post-op.  Now off Xarelto. 5) L Pleural Effusion: S/P Pleurex catheter on L. Continues to drain but less. Will decrease drainage to every other day. F/u with Dr. PVT next month.  6) Lightheadedness/presyncope: has h/o orthostatic hypotension.   - Continue compression hose  - Always take torsemide in the am to avoid getting up more at night.  Would also keep a bedside commode.   - Low threshold to add back midodrine  Jolaine Artist MD 03/27/2014

## 2014-03-28 ENCOUNTER — Encounter (HOSPITAL_COMMUNITY)
Admission: RE | Admit: 2014-03-28 | Discharge: 2014-03-28 | Disposition: A | Discharge status: Home / Self Care | Payer: Medicare Other | Source: Ambulatory Visit | Attending: Internal Medicine | Admitting: Internal Medicine

## 2014-03-28 MED FILL — Perflutren Lipid Microsphere IV Susp 1.1 MG/ML: INTRAVENOUS | Qty: 10 | Status: AC

## 2014-03-31 ENCOUNTER — Encounter (HOSPITAL_COMMUNITY)
Admission: RE | Admit: 2014-03-31 | Discharge: 2014-03-31 | Disposition: A | Discharge status: Home / Self Care | Payer: Medicare Other | Source: Ambulatory Visit | Attending: Internal Medicine | Admitting: Internal Medicine

## 2014-04-02 ENCOUNTER — Encounter (HOSPITAL_COMMUNITY)
Admission: RE | Admit: 2014-04-02 | Discharge: 2014-04-02 | Disposition: A | Discharge status: Home / Self Care | Payer: Medicare Other | Source: Ambulatory Visit | Attending: Internal Medicine | Admitting: Internal Medicine

## 2014-04-04 ENCOUNTER — Encounter (HOSPITAL_COMMUNITY)
Admission: RE | Admit: 2014-04-04 | Discharge: 2014-04-04 | Disposition: A | Discharge status: Home / Self Care | Payer: Medicare Other | Source: Ambulatory Visit | Attending: Internal Medicine | Admitting: Internal Medicine

## 2014-04-04 DIAGNOSIS — Z9889 Other specified postprocedural states: Secondary | ICD-10-CM | POA: Insufficient documentation

## 2014-04-04 DIAGNOSIS — Z5189 Encounter for other specified aftercare: Secondary | ICD-10-CM | POA: Insufficient documentation

## 2014-04-04 DIAGNOSIS — I509 Heart failure, unspecified: Secondary | ICD-10-CM | POA: Insufficient documentation

## 2014-04-04 DIAGNOSIS — I10 Essential (primary) hypertension: Secondary | ICD-10-CM | POA: Insufficient documentation

## 2014-04-04 DIAGNOSIS — I219 Acute myocardial infarction, unspecified: Secondary | ICD-10-CM | POA: Insufficient documentation

## 2014-04-04 DIAGNOSIS — I5022 Chronic systolic (congestive) heart failure: Secondary | ICD-10-CM | POA: Insufficient documentation

## 2014-04-07 ENCOUNTER — Other Ambulatory Visit: Payer: Self-pay

## 2014-04-07 ENCOUNTER — Encounter (HOSPITAL_COMMUNITY)
Admission: RE | Admit: 2014-04-07 | Discharge: 2014-04-07 | Disposition: A | Discharge status: Home / Self Care | Payer: Medicare Other | Source: Ambulatory Visit | Attending: Internal Medicine | Admitting: Internal Medicine

## 2014-04-07 DIAGNOSIS — R0602 Shortness of breath: Secondary | ICD-10-CM

## 2014-04-07 DIAGNOSIS — J90 Pleural effusion, not elsewhere classified: Secondary | ICD-10-CM

## 2014-04-08 ENCOUNTER — Other Ambulatory Visit: Payer: Self-pay | Admitting: Cardiothoracic Surgery

## 2014-04-08 DIAGNOSIS — J90 Pleural effusion, not elsewhere classified: Secondary | ICD-10-CM

## 2014-04-09 ENCOUNTER — Telehealth (HOSPITAL_COMMUNITY): Payer: Self-pay | Admitting: Adult Health

## 2014-04-09 ENCOUNTER — Ambulatory Visit
Admission: RE | Admit: 2014-04-09 | Discharge: 2014-04-09 | Disposition: A | Discharge status: Home / Self Care | Payer: Medicare Other | Source: Ambulatory Visit | Attending: Adult Health | Admitting: Adult Health

## 2014-04-09 ENCOUNTER — Encounter (HOSPITAL_COMMUNITY): Payer: Medicare Other

## 2014-04-09 ENCOUNTER — Ambulatory Visit (INDEPENDENT_AMBULATORY_CARE_PROVIDER_SITE_OTHER): Payer: PRIVATE HEALTH INSURANCE | Admitting: Cardiothoracic Surgery

## 2014-04-09 VITALS — BP 117/63 | HR 77 | Resp 18 | Ht 72.0 in | Wt 207.0 lb

## 2014-04-09 DIAGNOSIS — I509 Heart failure, unspecified: Secondary | ICD-10-CM

## 2014-04-09 DIAGNOSIS — Z951 Presence of aortocoronary bypass graft: Secondary | ICD-10-CM

## 2014-04-09 DIAGNOSIS — J90 Pleural effusion, not elsewhere classified: Secondary | ICD-10-CM

## 2014-04-09 NOTE — Progress Notes (Signed)
PCP is Thressa Sheller, MD Referring Provider is Leonie Man, MD  Chief Complaint  Patient presents with  . Follow-up    left pl eff/pleurX......draining QOD per Dr. Haroldine Laws...HAVING MORE SOB SINCE LAST WEEK...even though the drainage has decreased    HPI: Patient returns for left  Pleurx catheter check. The patient has advanced heart failure from ischemic cardiomyopathy when he had a completed severe anterior MI followed by emergency CABG.  The Pleurx catheter drainage has decreased over the past 2 weeks. 2 days ago was 100 cc in the office today was 0. Chest x-ray shows no reaccumulation of pleural effusion on the left. The patient has had a small chronic right pleural effusion which is probably slightly increased. He is at increased dyspnea with exertion and decreased exercise tolerance recently. His diuretic dose was recently reduced due to some diarrhea and lightheadedness.   Past Medical History  Diagnosis Date  . Acid reflux   . Family history of anesthesia complication     daughter had problem waking up  . Pneumonia     hx of  . Sleep apnea     no treatment for, last sleep study over 5 years ago  . Abdominal aortic aneurysm     found 06/29/12 "small"  . Headache(784.0)   . Herniated vertebral disc     thoracic area  . Cataracts, bilateral     hx of  . H/O Legionnaire's disease     about 24 years ago  . Anxiety     panic attacks   . Claustrophobia     "severe"  . Hypertension     sees  Dr. Alyson Ingles [phone]  . Obesity (BMI 35.0-39.9 without comorbidity) 10/06/2013  . Essential hypertension 10/06/2013  . Anginal pain   . Atrial fibrillation     Postoperative  . Acute renal insufficiency     Postoperative  . Anemia associated with acute blood loss     Postoperative    Past Surgical History  Procedure Laterality Date  . Shoulder surgery      left  . Cataract extraction w/ intraocular lens implant      bilateral  . Lumbar laminectomy/decompression  microdiscectomy  07/20/2012    Procedure: LUMBAR LAMINECTOMY/DECOMPRESSION MICRODISCECTOMY 1 LEVEL;  Surgeon: Faythe Ghee, MD;  Location: [facility];  Service: Neurosurgery;  Laterality: N/A;  lumbar four-five left  . Back surgery    . Cardiac catheterization  10/05/2013  . Eye surgery    . Coronary artery bypass graft N/A 10/08/2013    Procedure: CORONARY ARTERY BYPASS GRAFTING (CABG);  Surgeon: Ivin Poot, MD;  Location: [facility];  Service: Open Heart Surgery;  Laterality: N/A;  Times 2 using left internal mammary artery and endoscopically harvested left saphenous vein  . Intraoperative transesophageal echocardiogram N/A 10/08/2013    Procedure: INTRAOPERATIVE TRANSESOPHAGEAL ECHOCARDIOGRAM;  Surgeon: Ivin Poot, MD;  Location: [facility];  Service: Open Heart Surgery;  Laterality: N/A;  . Chest tube insertion Left 01/14/2014    Procedure: INSERTION PLEURAL DRAINAGE CATHETER;  Surgeon: Ivin Poot, MD;  Location: [facility];  Service: Thoracic;  Laterality: Left;    No family history on file.  Social History History  Substance Use Topics  . Smoking status: Former Smoker -- 0.50 packs/day    Types: Cigarettes, Cigars  . Smokeless tobacco: Never Used     Comment: 50 years ago  . Alcohol Use: No    Current Outpatient Prescriptions  Medication Sig Dispense Refill  . acetaminophen (  TYLENOL) 325 MG tablet Take 2 tablets (650 mg total) by mouth every 6 (six) hours as needed for mild pain, fever or headache.      . ALPRAZolam (XANAX) 0.25 MG tablet Take 0.25-1 mg by mouth 4 (four) times daily as needed for anxiety. Take 0.25 mg through the day as needed and take 1mg  at bedtime.      Marland Kitchen aspirin EC 81 MG tablet Take 81 mg by mouth daily.      . cholecalciferol (VITAMIN D) 1000 UNITS tablet Take 2,000 Units by mouth daily.      . citalopram (CELEXA) 20 MG tablet Take 1 tablet (20 mg total) by mouth daily.  30 tablet  6  . digoxin (LANOXIN) 0.125 MG tablet Take 1 tablet (0.125 mg total) by  mouth daily.  30 tablet  6  . docusate sodium (COLACE) 100 MG capsule Take 200 mg by mouth daily.      . feeding supplement, GLUCERNA SHAKE, (GLUCERNA SHAKE) LIQD Take 237 mLs by mouth 3 (three) times daily between meals as needed (meal replacement).      . Multiple Vitamin (MULTIVITAMIN WITH MINERALS) TABS tablet Take 1 tablet by mouth daily.      . nitroGLYCERIN (NITROSTAT) 0.3 MG SL tablet Place 1 tablet (0.3 mg total) under the tongue every 5 (five) minutes as needed for chest pain.  90 tablet  12  . polyethylene glycol (MIRALAX / GLYCOLAX) packet Take 17 g by mouth daily as needed for mild constipation.      . potassium chloride SA (K-DUR,KLOR-CON) 20 MEQ tablet Take 0.5 tablets (10 mEq total) by mouth every other day. Only if he take torsemide  30 tablet  6  . torsemide (DEMADEX) 20 MG tablet Take 10 mg by mouth every other day. T, TH, and SAT      . traMADol (ULTRAM) 50 MG tablet TAKE 1 TABLET BY MOUTH EVERY 6 HOURS DAILY AS NEEDED FOR PAIN  30 tablet  0  . Wheat Dextrin (BENEFIBER PO) Take 10 mLs by mouth 2 (two) times daily.      . Ascorbic Acid (VITAMIN C) 1000 MG tablet Take 1,000 mg by mouth daily.       No current facility-administered medications for this visit.    Allergies  Allergen Reactions  . Demerol [Meperidine] Other (See Comments)    "makes me crazy"  . Lisinopril Diarrhea  . Shellfish Allergy Rash    Review of Systems no fever, no drainage from the catheter exit site Patient currently draining the Pleurx catheter every other day  BP 117/63  Pulse 77  Resp 18  Ht 6' (1.829 m)  Wt 207 lb (93.895 kg)  BMI 28.07 kg/m2  SpO2 91% Physical Exam Patient is anxious and frustrated Breath sounds with bilateral basilar scattered rales Catheter site clean without erythema Drainage of left Pleurx catheter 0 cc today-sterile dressing reapplied Ankle edema 1-2+  Diagnostic Tests: Chest x-ray with basher congestion no significant reaccumulation of left pleural effusion  slight increase right mild pleural effusion  Impression: Pleurx catheter drainage decreasing. He'll reduce his drainage schedule to Tuesdays and Fridays. He'll return in 2 weeks. If drainage days minimal we will plan on removal catheter.  Plan: Return in 2 weeks with chest x-ray for reassessment

## 2014-04-09 NOTE — Telephone Encounter (Signed)
Left a message for follow up appointment Apr 14, 2014 at 2:40   Conrad Kennedy NP-C  3:25 PM

## 2014-04-11 ENCOUNTER — Encounter (HOSPITAL_COMMUNITY)
Admission: RE | Admit: 2014-04-11 | Discharge: 2014-04-11 | Disposition: A | Discharge status: Home / Self Care | Payer: Medicare Other | Source: Ambulatory Visit | Attending: Internal Medicine | Admitting: Internal Medicine

## 2014-04-14 ENCOUNTER — Encounter (HOSPITAL_COMMUNITY): Payer: Medicare Other

## 2014-04-14 ENCOUNTER — Ambulatory Visit (HOSPITAL_COMMUNITY)
Admission: RE | Admit: 2014-04-14 | Discharge: 2014-04-14 | Disposition: A | Discharge status: Home / Self Care | Payer: Medicare Other | Source: Ambulatory Visit | Attending: Internal Medicine | Admitting: Internal Medicine

## 2014-04-14 VITALS — BP 128/68 | HR 77 | Wt 213.2 lb

## 2014-04-14 DIAGNOSIS — I2510 Atherosclerotic heart disease of native coronary artery without angina pectoris: Secondary | ICD-10-CM | POA: Insufficient documentation

## 2014-04-14 DIAGNOSIS — J90 Pleural effusion, not elsewhere classified: Secondary | ICD-10-CM

## 2014-04-14 DIAGNOSIS — I5022 Chronic systolic (congestive) heart failure: Secondary | ICD-10-CM | POA: Insufficient documentation

## 2014-04-14 DIAGNOSIS — I509 Heart failure, unspecified: Secondary | ICD-10-CM | POA: Insufficient documentation

## 2014-04-14 DIAGNOSIS — F411 Generalized anxiety disorder: Secondary | ICD-10-CM | POA: Insufficient documentation

## 2014-04-14 MED ORDER — TORSEMIDE 20 MG PO TABS: 20.0000 mg | ORAL_TABLET | Freq: Every day | ORAL | Status: DC

## 2014-04-14 NOTE — Progress Notes (Signed)
Patient ID: [name], male   DOB: February 06, 1933, 80 y.o.   MRN: [id number] PCP: Dr. Noah Delaine Primary Cardiologist: Dr. Ellyn Hack Surgeon: Dr. Darcey Nora  HPI: Mr. Gortney is a 80 yo male with a hx of HTN, severe anxiety, OSA , systolic HF due to ICM EF 25% 10/15/2013, S/P CABG x 2 LIMA-LAD; SVG-DIAG on 10/08/13.   On 10/05/2013 had late presentation with anterior MI. Underwent emergent LAD PCI followed by emergent CABG. Postoperative course was complicated by shock and cardiorenal syndrome which required lasix drip, milrinone and dopamine. His post operative course was also complicated by DVT in his R peroneal vein. As he improved all drips were weaned off and he was transferred to CIR on 10/24/13. He was later transferred back to acute care with volme overload and low output. Placed back on Milrinone and lasix drip. Later this was weaned off. Course also c/b orthostatic hypotension. EF 25%. Discharged home with LifeVest.  Lifevest stopped in December at his request.  Echo in 1/15 with EF 35-40%. Echo 03/27/14 EF 20-25% mild MR. RV ok. No apical clot  He underwent Pleurex catheter placement for L pleural effusion in 2/15 by Dr. Prescott Gum.   Follow-up:  Saw me about two weeks ago. Echo showed EF 20%. Was on Torsemide on 10mg  on T, Th, Sat. Pleurex drainage was way down so scheduled changed to 2x/week drainage. Last week daughter called to say he was doing worse. Sleeping 23 hours per day. Very weak. + bed sores. Weight stable at home. But dyspnea is worse and ankles markedly swollen. Called the office and demadex increased to 20mg  T, TH sat.  Urine output increased but fluid pills make him feel worse. Has to struggle to get to bathroom. Possibility of Hospice raised.    Labs 11/15/13 K 3.9 Creatinine 1.46, digoxin 0.6 Labs 03/12/14: K+ 4.2, Creatinine 1.2  ROS: All systems negative except as listed in HPI, PMH and Problem List.  Past Medical History  Diagnosis Date  . Acid reflux   . Family  history of anesthesia complication     daughter had problem waking up  . Pneumonia     hx of  . Sleep apnea     no treatment for, last sleep study over 5 years ago  . Abdominal aortic aneurysm     found 06/29/12 "small"  . Headache(784.0)   . Herniated vertebral disc     thoracic area  . Cataracts, bilateral     hx of  . H/O Legionnaire's disease     about 24 years ago  . Anxiety     panic attacks   . Claustrophobia     "severe"  . Hypertension     sees  Dr. Alyson Ingles [phone]  . Obesity (BMI 35.0-39.9 without comorbidity) 10/06/2013  . Essential hypertension 10/06/2013  . Anginal pain   . Atrial fibrillation     Postoperative  . Acute renal insufficiency     Postoperative  . Anemia associated with acute blood loss     Postoperative    Current Outpatient Prescriptions  Medication Sig Dispense Refill  . acetaminophen (TYLENOL) 325 MG tablet Take 2 tablets (650 mg total) by mouth every 6 (six) hours as needed for mild pain, fever or headache.      . ALPRAZolam (XANAX) 0.25 MG tablet Take 0.25-1 mg by mouth 4 (four) times daily as needed for anxiety. Take 0.25 mg through the day as needed and take 1mg  at bedtime.      Marland Kitchen  Ascorbic Acid (VITAMIN C) 1000 MG tablet Take 1,000 mg by mouth daily.      Marland Kitchen aspirin EC 81 MG tablet Take 81 mg by mouth daily.      . cholecalciferol (VITAMIN D) 1000 UNITS tablet Take 2,000 Units by mouth daily.      . citalopram (CELEXA) 20 MG tablet Take 1 tablet (20 mg total) by mouth daily.  30 tablet  6  . digoxin (LANOXIN) 0.125 MG tablet Take 1 tablet (0.125 mg total) by mouth daily.  30 tablet  6  . docusate sodium (COLACE) 100 MG capsule Take 200 mg by mouth daily.      . feeding supplement, GLUCERNA SHAKE, (GLUCERNA SHAKE) LIQD Take 237 mLs by mouth 3 (three) times daily between meals as needed (meal replacement).      . Multiple Vitamin (MULTIVITAMIN WITH MINERALS) TABS tablet Take 1 tablet by mouth daily.      . nitroGLYCERIN (NITROSTAT) 0.3 MG  SL tablet Place 1 tablet (0.3 mg total) under the tongue every 5 (five) minutes as needed for chest pain.  90 tablet  12  . polyethylene glycol (MIRALAX / GLYCOLAX) packet Take 17 g by mouth daily as needed for mild constipation.      . potassium chloride SA (K-DUR,KLOR-CON) 20 MEQ tablet Take 0.5 tablets (10 mEq total) by mouth every other day. Only if he take torsemide  30 tablet  6  . torsemide (DEMADEX) 20 MG tablet Take 10 mg by mouth every other day. T, TH, and SAT      . traMADol (ULTRAM) 50 MG tablet TAKE 1 TABLET BY MOUTH EVERY 6 HOURS DAILY AS NEEDED FOR PAIN  30 tablet  0  . Wheat Dextrin (BENEFIBER PO) Take 10 mLs by mouth 2 (two) times daily.       No current facility-administered medications for this encounter.    Filed Vitals:   04/14/14 1454  BP: 128/68  Pulse: 77  Weight: 213 lb 4 oz (96.73 kg)  SpO2: 94%   PHYSICAL EXAM: General:  Weak appearing.  HEENT: normal Neck: supple. JVP 9 cm. Carotids 2+ bilaterally; no bruits. No lymphadenopathy or thryomegaly appreciated. Cor: PMI normal. Regular rate & rhythm. No rubs, gallops or murmurs. Sternal incision healed.   Lungs: Clear   decreased R base Abdomen: soft, nontender, nondistended. No hepatosplenomegaly. No bruits or masses. Good bowel sounds. Pleurex cath ok.  Extremities: no cyanosis, clubbing, rash, 2-3+edema bilaterally.  Neuro: alert & orientedx3, cranial nerves grossly intact. Moves all 4 extremities w/o difficulty. Affect pleasant.  ASSESSMENT & PLAN: 1.Chronic Systolic Heart Failure: Echo 4/15 EF 20%. NYHA IIIb-IV.   -He continues to deteriorate with NYHA IIIB-IV symptoms. Now with increasing volume overload but poor tolerance of increasing diuretics due to profound fatigue. - We talked at length about his deteriorating condition and reviewed options including consideration of inotropic support, continued medical therapy or Palliative Care. He is not interested in milrinone or Palliative Care at this point. We  discussed admission for IV diuresis which he also refused.  Will increase demadex to 20mg  daily to see how he responds. He knows to call if he is getting worse and we can plan admission.  2) CAD: Recent anterior STEMI, S/P CABG x 2 10/08/13 LIMA-LAD; SVG-DIAG on 10/08/13 . No chest pain. Myalgias with atorvastatin.  He is quite reticent to try another statin. Now that he is off Xarelto will start ASA 81.  3) Anxiety: Significant.  On chronic Xanax 3-4 times a  day at home and celexa 20 mg daily.    4) RLE DVT: Post-op.  Now off Xarelto. 5) L Pleural Effusion: S/P Pleurex catheter on L. No longer draining. He is dull on Right. Will check CXR.  6) Lightheadedness/presyncope: has h/o orthostatic hypotension.   - Refuses compression stockings.   Shaune Pascal Savir Blanke MD 04/14/2014   Addendum: CXR with bilateral effusions R>L. Will see if R effusion improves with diuresis.  Shaune Pascal Annaly Skop,MD 3:56 PM

## 2014-04-14 NOTE — Patient Instructions (Signed)
INCREASE Torsemide to 20 mg daily  A chest x-ray takes a picture of the organs and structures inside the chest, including the heart, lungs, and blood vessels. This test can show several things, including, whether the heart is enlarges; whether fluid is building up in the lungs; and whether pacemaker / defibrillator leads are still in place.  Your physician recommends that you schedule a follow-up appointment in: 1 week  Do the following things EVERYDAY: 1) Weigh yourself in the morning before breakfast. Write it down and keep it in a log. 2) Take your medicines as prescribed 3) Eat low salt foods-Limit salt (sodium) to 2000 mg per day.  4) Stay as active as you can everyday 5) Limit all fluids for the day to less than 2 liters 6)

## 2014-04-16 ENCOUNTER — Ambulatory Visit: Payer: Medicare Other | Admitting: Cardiothoracic Surgery

## 2014-04-16 ENCOUNTER — Encounter (HOSPITAL_COMMUNITY): Payer: Medicare Other

## 2014-04-17 ENCOUNTER — Telehealth (HOSPITAL_COMMUNITY): Payer: Self-pay | Admitting: *Deleted

## 2014-04-17 NOTE — Telephone Encounter (Signed)
LATE ENTRY:  Spoke w/pt's daughter last night for 40 min, she is very concerned about pt and states that he is considering milrinone now that he has thought about it, she has a lot of questions regarding the process to get, explained everything to her (he will need RHC, overnight stay in hospital etc).  They will keep appt as sch with Dr Haroldine Laws on Mon 5/18 to make a final plan.  Pt's daughter called back again this AM stating pt's wt is down to 204 lb, he was 209 lb Monday on his home scale, he is concerned as he had been told previously that wt should not go below 205 lb, explained pt had volume overload on Mon and since pt is no longer really eating he is probably also losing body weight so the 205 is no longer relevant, advised he could decrease Torsemide to 1/2 tab (10 mg) daily since wt was down 5 lbs and edema is better, if wt increases back up they are instructed to increase back to 20 mg daily, daughter is aware and agreeable.

## 2014-04-18 ENCOUNTER — Encounter (HOSPITAL_COMMUNITY): Payer: Medicare Other

## 2014-04-21 ENCOUNTER — Encounter (HOSPITAL_COMMUNITY): Payer: Self-pay | Admitting: *Deleted

## 2014-04-21 ENCOUNTER — Encounter (HOSPITAL_COMMUNITY): Payer: Medicare Other

## 2014-04-21 ENCOUNTER — Ambulatory Visit (HOSPITAL_BASED_OUTPATIENT_CLINIC_OR_DEPARTMENT_OTHER)
Admission: RE | Admit: 2014-04-21 | Discharge: 2014-04-21 | Disposition: A | Discharge status: Home / Self Care | Payer: Medicare Other | Source: Ambulatory Visit | Attending: Internal Medicine | Admitting: Internal Medicine

## 2014-04-21 ENCOUNTER — Telehealth (HOSPITAL_COMMUNITY): Payer: Self-pay | Admitting: Cardiology

## 2014-04-21 ENCOUNTER — Other Ambulatory Visit: Payer: Self-pay | Admitting: *Deleted

## 2014-04-21 ENCOUNTER — Encounter (HOSPITAL_COMMUNITY): Payer: Self-pay

## 2014-04-21 VITALS — BP 128/64 | HR 78 | Wt 210.8 lb

## 2014-04-21 DIAGNOSIS — R55 Syncope and collapse: Secondary | ICD-10-CM | POA: Insufficient documentation

## 2014-04-21 DIAGNOSIS — F411 Generalized anxiety disorder: Secondary | ICD-10-CM | POA: Insufficient documentation

## 2014-04-21 DIAGNOSIS — I252 Old myocardial infarction: Secondary | ICD-10-CM | POA: Insufficient documentation

## 2014-04-21 DIAGNOSIS — I2510 Atherosclerotic heart disease of native coronary artery without angina pectoris: Secondary | ICD-10-CM | POA: Insufficient documentation

## 2014-04-21 DIAGNOSIS — I5022 Chronic systolic (congestive) heart failure: Secondary | ICD-10-CM | POA: Insufficient documentation

## 2014-04-21 DIAGNOSIS — R42 Dizziness and giddiness: Secondary | ICD-10-CM

## 2014-04-21 DIAGNOSIS — Z951 Presence of aortocoronary bypass graft: Secondary | ICD-10-CM

## 2014-04-21 DIAGNOSIS — I509 Heart failure, unspecified: Secondary | ICD-10-CM | POA: Insufficient documentation

## 2014-04-21 DIAGNOSIS — Z86718 Personal history of other venous thrombosis and embolism: Secondary | ICD-10-CM | POA: Insufficient documentation

## 2014-04-21 DIAGNOSIS — I2589 Other forms of chronic ischemic heart disease: Secondary | ICD-10-CM | POA: Insufficient documentation

## 2014-04-21 DIAGNOSIS — I10 Essential (primary) hypertension: Secondary | ICD-10-CM

## 2014-04-21 LAB — CBC
HCT: 30.4 % — ABNORMAL LOW (ref 39.0–52.0)
Hemoglobin: 9.5 g/dL — ABNORMAL LOW (ref 13.0–17.0)
MCH: 27 pg (ref 26.0–34.0)
MCHC: 31.3 g/dL (ref 30.0–36.0)
MCV: 86.4 fL (ref 78.0–100.0)
Platelets: 446 10*3/uL — ABNORMAL HIGH (ref 150–400)
RBC: 3.52 MIL/uL — AB (ref 4.22–5.81)
RDW: 16.2 % — AB (ref 11.5–15.5)
WBC: 8.1 10*3/uL (ref 4.0–10.5)

## 2014-04-21 LAB — BASIC METABOLIC PANEL
BUN: 24 mg/dL — ABNORMAL HIGH (ref 6–23)
CALCIUM: 9.2 mg/dL (ref 8.4–10.5)
CHLORIDE: 99 meq/L (ref 96–112)
CO2: 33 mEq/L — ABNORMAL HIGH (ref 19–32)
CREATININE: 1.05 mg/dL (ref 0.50–1.35)
GFR calc non Af Amer: 66 mL/min — ABNORMAL LOW (ref >90)
GFR, EST AFRICAN AMERICAN: 76 mL/min — AB (ref >90)
Glucose, Bld: 101 mg/dL — ABNORMAL HIGH (ref 70–99)
Potassium: 3.9 mEq/L (ref 3.7–5.3)
Sodium: 140 mEq/L (ref 137–147)

## 2014-04-21 LAB — PROTIME-INR
INR: 1.14 (ref 0.00–1.49)
Prothrombin Time: 14.4 seconds (ref 11.6–15.2)

## 2014-04-21 NOTE — Progress Notes (Signed)
Patient ID: Antonio Bernard, male   DOB: 03/04/1933, 80 y.o.   MRN: [id number] PCP: Dr. Noah Delaine Primary Cardiologist: Dr. Ellyn Hack Surgeon: Dr. Darcey Nora  HPI: Mr. Penrod is a 80 yo male with a hx of HTN, severe anxiety, OSA , systolic HF due to ICM EF 25% 10/15/2013, S/P CABG x 2 LIMA-LAD; SVG-DIAG on 10/08/13.   On 10/05/2013 had late presentation with anterior MI. Underwent emergent LAD PCI followed by emergent CABG. Postoperative course was complicated by shock and cardiorenal syndrome which required lasix drip, milrinone and dopamine. His post operative course was also complicated by DVT in his R peroneal vein. As he improved all drips were weaned off and he was transferred to CIR on 10/24/13. He was later transferred back to acute care with volme overload and low output. Placed back on Milrinone and lasix drip. Later this was weaned off. Course also c/b orthostatic hypotension. EF 25%. Discharged home with LifeVest.  Lifevest stopped in December at his request.  Echo in 1/15 with EF 35-40%. Echo 03/27/14 EF 20-25% mild MR. RV ok. No apical clot  He underwent Pleurex catheter placement for L pleural effusion in 2/15 by Dr. Prescott Gum.   He returns for follow. Had syncopal episode last Monday. Weight at home trending down 210 down to 204 pounds. Last week he was instructed to take additional torsemide for the last 5 days. SOB with exertion> + Orthopnea. Unable to to continue cardiac rehab. Has minimal output from pleurex catheter. Poor appetite. Daughter manages medications.     Labs 11/15/13 K 3.9 Creatinine 1.46, digoxin 0.6 Labs 03/12/14: K+ 4.2, Creatinine 1.2  ROS: All systems negative except as listed in HPI, PMH and Problem List.  Past Medical History  Diagnosis Date  . Acid reflux   . Family history of anesthesia complication     daughter had problem waking up  . Pneumonia     hx of  . Sleep apnea     no treatment for, last sleep study over 5 years ago  . Abdominal aortic  aneurysm     found 06/29/12 "small"  . Headache(784.0)   . Herniated vertebral disc     thoracic area  . Cataracts, bilateral     hx of  . H/O Legionnaire's disease     about 24 years ago  . Anxiety     panic attacks   . Claustrophobia     "severe"  . Hypertension     sees  Dr. Alyson Ingles [phone]  . Obesity (BMI 35.0-39.9 without comorbidity) 10/06/2013  . Essential hypertension 10/06/2013  . Anginal pain   . Atrial fibrillation     Postoperative  . Acute renal insufficiency     Postoperative  . Anemia associated with acute blood loss     Postoperative    Current Outpatient Prescriptions  Medication Sig Dispense Refill  . acetaminophen (TYLENOL) 325 MG tablet Take 2 tablets (650 mg total) by mouth every 6 (six) hours as needed for mild pain, fever or headache.      . ALPRAZolam (XANAX) 0.25 MG tablet Take 0.25-1 mg by mouth 4 (four) times daily as needed for anxiety. Take 0.25 mg through the day as needed and take 1mg  at bedtime.      . Ascorbic Acid (VITAMIN C) 1000 MG tablet Take 1,000 mg by mouth daily.      Marland Kitchen aspirin EC 81 MG tablet Take 81 mg by mouth daily.      . cholecalciferol (VITAMIN D) 1000 UNITS  tablet Take 2,000 Units by mouth daily.      . citalopram (CELEXA) 20 MG tablet Take 1 tablet (20 mg total) by mouth daily.  30 tablet  6  . digoxin (LANOXIN) 0.125 MG tablet Take 1 tablet (0.125 mg total) by mouth daily.  30 tablet  6  . docusate sodium (COLACE) 100 MG capsule Take 200 mg by mouth daily.      . feeding supplement, GLUCERNA SHAKE, (GLUCERNA SHAKE) LIQD Take 237 mLs by mouth 3 (three) times daily between meals as needed (meal replacement).      . Multiple Vitamin (MULTIVITAMIN WITH MINERALS) TABS tablet Take 1 tablet by mouth daily.      . nitroGLYCERIN (NITROSTAT) 0.3 MG SL tablet Place 1 tablet (0.3 mg total) under the tongue every 5 (five) minutes as needed for chest pain.  90 tablet  12  . polyethylene glycol (MIRALAX / GLYCOLAX) packet Take 17 g by  mouth daily as needed for mild constipation.      . potassium chloride SA (K-DUR,KLOR-CON) 20 MEQ tablet Take 0.5 tablets (10 mEq total) by mouth every other day. Only if he take torsemide  30 tablet  6  . torsemide (DEMADEX) 20 MG tablet Take 1 tablet (20 mg total) by mouth daily.  30 tablet  3  . traMADol (ULTRAM) 50 MG tablet TAKE 1 TABLET BY MOUTH EVERY 6 HOURS DAILY AS NEEDED FOR PAIN  30 tablet  0  . Wheat Dextrin (BENEFIBER PO) Take 10 mLs by mouth 2 (two) times daily.       No current facility-administered medications for this encounter.    Filed Vitals:   04/21/14 1442  BP: 128/64  Pulse: 78  Weight: 210 lb 12.8 oz (95.618 kg)  SpO2: 93%   PHYSICAL EXAM: General:  Weak appearing.  HEENT: normal Neck: supple. JVP 9 cm. Carotids 2+ bilaterally; no bruits. No lymphadenopathy or thryomegaly appreciated. Cor: PMI normal. Regular rate & rhythm. No rubs, gallops or murmurs. Sternal incision healed.   Lungs: Clear   decreased R base Abdomen: soft, nontender, nondistended. No hepatosplenomegaly. No bruits or masses. Good bowel sounds. Pleurex cath ok.  Extremities: no cyanosis, clubbing, rash, 2-3+edema bilaterally.  Neuro: alert & orientedx3, cranial nerves grossly intact. Moves all 4 extremities w/o difficulty. Affect pleasant.  ASSESSMENT & PLAN: 1.Chronic Systolic Heart Failure: Echo 4/15 EF 20%. NYHA IIIb-IV.   Plan for RHC tomorrow. Offered hospital admit however he declines.   If low output will admit for home inotropes.  Continue current regimen for now and adjust diuretics post  RHC.  2) CAD: Recent anterior STEMI, S/P CABG x 2 10/08/13 LIMA-LAD; SVG-DIAG on 10/08/13 . No chest pain. Myalgias with atorvastatin.  He is quite reticent to try another statin. Now that he is off Xarelto will start ASA 81.  3) Anxiety: Significant.  On chronic Xanax 3-4 times a day at home and celexa 20 mg daily.    4) RLE DVT: Post-op.  Now off Xarelto. 5) L Pleural Effusion: S/P Pleurex catheter  on L. No longer draining. Per Dr Darcey Nora  6) Lightheadedness/presyncope: has h/o orthostatic hypotension.   - Refuses compression stockings.   Follow up in 2 weeks.  [name]  04/21/2014  Patient seen and examined with [name], NP. We discussed all aspects of the encounter. I agree with the assessment and plan as stated above.   He continues to struggle with NYHA IIIB-IV symptoms. He is willing to consider inotropes at this  point. We will proceed with RHC in the am tomorrow to further evaluate. If low output will admit for initiation of dobutamine. Cath discussed in detail.   Shaune Pascal Kinaya Hilliker,MD 3:48 PM

## 2014-04-21 NOTE — Telephone Encounter (Signed)
Pt scheduled for RHC cpt code 93453     icd 9 428.0 With pts current insurance- Medicare A and B No pre cert req'd

## 2014-04-21 NOTE — Patient Instructions (Signed)
Labs today  Catheterization is scheduled for 04/22/14, see instruction sheet   Your physician recommends that you schedule a follow-up appointment in: 3 weeks

## 2014-04-22 ENCOUNTER — Inpatient Hospital Stay (HOSPITAL_COMMUNITY)
Admission: RE | Admit: 2014-04-22 | Discharge: 2014-04-24 | DRG: 287 | Disposition: A | Discharge status: Home / Self Care | Payer: Medicare Other | Source: Ambulatory Visit | Attending: Internal Medicine | Admitting: Internal Medicine

## 2014-04-22 ENCOUNTER — Encounter (HOSPITAL_COMMUNITY)
Admission: RE | Disposition: A | Discharge status: Home / Self Care | Payer: PRIVATE HEALTH INSURANCE | Source: Ambulatory Visit | Attending: Internal Medicine

## 2014-04-22 ENCOUNTER — Encounter (HOSPITAL_COMMUNITY): Payer: Self-pay | Admitting: *Deleted

## 2014-04-22 DIAGNOSIS — Z7982 Long term (current) use of aspirin: Secondary | ICD-10-CM

## 2014-04-22 DIAGNOSIS — I2589 Other forms of chronic ischemic heart disease: Secondary | ICD-10-CM | POA: Diagnosis present

## 2014-04-22 DIAGNOSIS — Z885 Allergy status to narcotic agent status: Secondary | ICD-10-CM

## 2014-04-22 DIAGNOSIS — Z4682 Encounter for fitting and adjustment of non-vascular catheter: Secondary | ICD-10-CM | POA: Diagnosis not present

## 2014-04-22 DIAGNOSIS — I509 Heart failure, unspecified: Secondary | ICD-10-CM

## 2014-04-22 DIAGNOSIS — Z87891 Personal history of nicotine dependence: Secondary | ICD-10-CM | POA: Diagnosis not present

## 2014-04-22 DIAGNOSIS — I10 Essential (primary) hypertension: Secondary | ICD-10-CM | POA: Diagnosis present

## 2014-04-22 DIAGNOSIS — F411 Generalized anxiety disorder: Secondary | ICD-10-CM | POA: Diagnosis present

## 2014-04-22 DIAGNOSIS — Z9861 Coronary angioplasty status: Secondary | ICD-10-CM | POA: Diagnosis not present

## 2014-04-22 DIAGNOSIS — G4733 Obstructive sleep apnea (adult) (pediatric): Secondary | ICD-10-CM | POA: Diagnosis present

## 2014-04-22 DIAGNOSIS — I2510 Atherosclerotic heart disease of native coronary artery without angina pectoris: Secondary | ICD-10-CM | POA: Diagnosis present

## 2014-04-22 DIAGNOSIS — Z86718 Personal history of other venous thrombosis and embolism: Secondary | ICD-10-CM

## 2014-04-22 DIAGNOSIS — I5022 Chronic systolic (congestive) heart failure: Secondary | ICD-10-CM

## 2014-04-22 DIAGNOSIS — I5023 Acute on chronic systolic (congestive) heart failure: Secondary | ICD-10-CM | POA: Diagnosis present

## 2014-04-22 DIAGNOSIS — Z91013 Allergy to seafood: Secondary | ICD-10-CM

## 2014-04-22 DIAGNOSIS — Z951 Presence of aortocoronary bypass graft: Secondary | ICD-10-CM | POA: Diagnosis not present

## 2014-04-22 DIAGNOSIS — E876 Hypokalemia: Secondary | ICD-10-CM | POA: Diagnosis present

## 2014-04-22 DIAGNOSIS — I252 Old myocardial infarction: Secondary | ICD-10-CM | POA: Diagnosis not present

## 2014-04-22 DIAGNOSIS — Z888 Allergy status to other drugs, medicaments and biological substances status: Secondary | ICD-10-CM

## 2014-04-22 HISTORY — PX: RIGHT HEART CATHETERIZATION: SHX5447

## 2014-04-22 LAB — CREATININE, SERUM
CREATININE: 0.89 mg/dL (ref 0.50–1.35)
GFR calc non Af Amer: 80 mL/min — ABNORMAL LOW (ref >90)

## 2014-04-22 LAB — POCT I-STAT 3, VENOUS BLOOD GAS (G3P V)
Acid-Base Excess: 4 mmol/L — ABNORMAL HIGH (ref 0.0–2.0)
Acid-Base Excess: 4 mmol/L — ABNORMAL HIGH (ref 0.0–2.0)
BICARBONATE: 30.1 meq/L — AB (ref 20.0–24.0)
BICARBONATE: 30.9 meq/L — AB (ref 20.0–24.0)
O2 Saturation: 59 %
O2 Saturation: 57 %
PH VEN: 7.362 — AB (ref 7.250–7.300)
PH VEN: 7.363 — AB (ref 7.250–7.300)
PO2 VEN: 31 mmHg (ref 30.0–45.0)
TCO2: 32 mmol/L (ref 0–100)
TCO2: 33 mmol/L (ref 0–100)
pCO2, Ven: 54.4 mmHg — ABNORMAL HIGH (ref 45.0–50.0)
pCO2, Ven: 52.9 mmHg — ABNORMAL HIGH (ref 45.0–50.0)
pO2, Ven: 33 mmHg (ref 30.0–45.0)

## 2014-04-22 LAB — CBC
HCT: 30.5 % — ABNORMAL LOW (ref 39.0–52.0)
Hemoglobin: 9.6 g/dL — ABNORMAL LOW (ref 13.0–17.0)
MCH: 27.4 pg (ref 26.0–34.0)
MCHC: 31.5 g/dL (ref 30.0–36.0)
MCV: 87.1 fL (ref 78.0–100.0)
PLATELETS: 505 10*3/uL — AB (ref 150–400)
RBC: 3.5 MIL/uL — AB (ref 4.22–5.81)
RDW: 16.2 % — ABNORMAL HIGH (ref 11.5–15.5)
WBC: 7.6 10*3/uL (ref 4.0–10.5)

## 2014-04-22 LAB — MRSA PCR SCREENING: MRSA BY PCR: NEGATIVE

## 2014-04-22 LAB — DIGOXIN LEVEL: DIGOXIN LVL: 0.8 ng/mL (ref 0.8–2.0)

## 2014-04-22 SURGERY — RIGHT HEART CATH
Anesthesia: LOCAL

## 2014-04-22 MED ORDER — ALPRAZOLAM 0.25 MG PO TABS
0.2500 mg | ORAL_TABLET | Freq: Four times a day (QID) | ORAL | Status: DC | PRN
  Administered 2014-04-22 – 2014-04-24 (×5): 0.25 mg via ORAL
  Filled 2014-04-22 (×5): qty 1

## 2014-04-22 MED ORDER — SODIUM CHLORIDE 0.9 % IV SOLN: 250.0000 mL | INTRAVENOUS | Status: DC | PRN

## 2014-04-22 MED ORDER — MIDAZOLAM HCL 2 MG/2ML IJ SOLN
INTRAMUSCULAR | Status: AC
  Filled 2014-04-22: qty 2

## 2014-04-22 MED ORDER — FENTANYL CITRATE 0.05 MG/ML IJ SOLN
INTRAMUSCULAR | Status: AC
  Filled 2014-04-22: qty 2

## 2014-04-22 MED ORDER — ASPIRIN EC 81 MG PO TBEC
81.0000 mg | DELAYED_RELEASE_TABLET | Freq: Every day | ORAL | Status: DC
Start: 2014-04-22 — End: 2014-04-24
  Administered 2014-04-23 – 2014-04-24 (×2): 81 mg via ORAL
  Filled 2014-04-22 (×3): qty 1

## 2014-04-22 MED ORDER — CITALOPRAM HYDROBROMIDE 20 MG PO TABS
20.0000 mg | ORAL_TABLET | Freq: Every day | ORAL | Status: DC
  Administered 2014-04-22 – 2014-04-23 (×2): 20 mg via ORAL
  Filled 2014-04-22 (×3): qty 1

## 2014-04-22 MED ORDER — SODIUM CHLORIDE 0.9 % IJ SOLN
3.0000 mL | Freq: Two times a day (BID) | INTRAMUSCULAR | Status: DC
  Administered 2014-04-22 – 2014-04-24 (×3): 3 mL via INTRAVENOUS

## 2014-04-22 MED ORDER — SODIUM CHLORIDE 0.9 % IJ SOLN: 3.0000 mL | INTRAMUSCULAR | Status: DC | PRN

## 2014-04-22 MED ORDER — VITAMIN D3 25 MCG (1000 UNIT) PO TABS
1000.0000 [IU] | ORAL_TABLET | Freq: Every day | ORAL | Status: DC
  Administered 2014-04-23 – 2014-04-24 (×2): 1000 [IU] via ORAL
  Filled 2014-04-22 (×2): qty 1

## 2014-04-22 MED ORDER — ACETAMINOPHEN 325 MG PO TABS
650.0000 mg | ORAL_TABLET | ORAL | Status: DC | PRN
  Administered 2014-04-23 – 2014-04-24 (×3): 650 mg via ORAL
  Filled 2014-04-22 (×3): qty 2

## 2014-04-22 MED ORDER — SODIUM CHLORIDE 0.9 % IV SOLN: INTRAVENOUS | Status: DC

## 2014-04-22 MED ORDER — ALPRAZOLAM 0.5 MG PO TABS
1.0000 mg | ORAL_TABLET | Freq: Every day | ORAL | Status: DC
  Administered 2014-04-22 – 2014-04-23 (×2): 1 mg via ORAL
  Filled 2014-04-22 (×2): qty 2

## 2014-04-22 MED ORDER — POTASSIUM CHLORIDE CRYS ER 20 MEQ PO TBCR
20.0000 meq | EXTENDED_RELEASE_TABLET | Freq: Two times a day (BID) | ORAL | Status: DC
  Administered 2014-04-22: 20 meq via ORAL
  Filled 2014-04-22 (×3): qty 1

## 2014-04-22 MED ORDER — ALPRAZOLAM 0.25 MG PO TABS
ORAL_TABLET | ORAL | Status: AC
  Filled 2014-04-22: qty 1

## 2014-04-22 MED ORDER — LIDOCAINE HCL (PF) 1 % IJ SOLN
INTRAMUSCULAR | Status: AC
  Filled 2014-04-22: qty 30

## 2014-04-22 MED ORDER — FUROSEMIDE 10 MG/ML IJ SOLN
80.0000 mg | Freq: Two times a day (BID) | INTRAMUSCULAR | Status: DC
  Administered 2014-04-22 – 2014-04-23 (×4): 80 mg via INTRAVENOUS
  Filled 2014-04-22 (×7): qty 8

## 2014-04-22 MED ORDER — FUROSEMIDE 10 MG/ML IJ SOLN: 80.0000 mg | Freq: Once | INTRAMUSCULAR | Status: DC

## 2014-04-22 MED ORDER — SODIUM CHLORIDE 0.9 % IJ SOLN
3.0000 mL | Freq: Two times a day (BID) | INTRAMUSCULAR | Status: DC
  Administered 2014-04-22 – 2014-04-24 (×6): 3 mL via INTRAVENOUS

## 2014-04-22 MED ORDER — ENOXAPARIN SODIUM 40 MG/0.4ML ~~LOC~~ SOLN
40.0000 mg | SUBCUTANEOUS | Status: DC
  Filled 2014-04-22: qty 0.4

## 2014-04-22 MED ORDER — FUROSEMIDE 10 MG/ML IJ SOLN
INTRAMUSCULAR | Status: AC
  Filled 2014-04-22: qty 8

## 2014-04-22 MED ORDER — SODIUM CHLORIDE 0.9 % IJ SOLN: 3.0000 mL | Freq: Two times a day (BID) | INTRAMUSCULAR | Status: DC

## 2014-04-22 MED ORDER — ENOXAPARIN SODIUM 40 MG/0.4ML ~~LOC~~ SOLN
40.0000 mg | SUBCUTANEOUS | Status: DC
  Administered 2014-04-23 – 2014-04-24 (×2): 40 mg via SUBCUTANEOUS
  Filled 2014-04-22 (×3): qty 0.4

## 2014-04-22 MED ORDER — HEPARIN (PORCINE) IN NACL 2-0.9 UNIT/ML-% IJ SOLN
INTRAMUSCULAR | Status: AC
  Filled 2014-04-22: qty 500

## 2014-04-22 MED ORDER — ONDANSETRON HCL 4 MG/2ML IJ SOLN: 4.0000 mg | Freq: Four times a day (QID) | INTRAMUSCULAR | Status: DC | PRN

## 2014-04-22 MED ORDER — DIGOXIN 125 MCG PO TABS
0.1250 mg | ORAL_TABLET | Freq: Every day | ORAL | Status: DC
  Administered 2014-04-23 – 2014-04-24 (×2): 0.125 mg via ORAL
  Filled 2014-04-22 (×3): qty 1

## 2014-04-22 NOTE — CV Procedure (Signed)
Cardiac Cath Procedure Note:  Indication:  Heart failure   Procedures performed:  1) Right heart catheterization 2) Placement of central venous line  Description of procedure:   The risks and indication of the procedure were explained. Consent was signed and placed on the chart. An appropriate timeout was taken prior to the procedure. The right neck was prepped and draped in the routine sterile fashion and anesthetized with 1% local lidocaine.   A 7 FR venous sheath was placed in the right internal jugular vein using a modified Seldinger technique. A standard Swan-Ganz catheter was used for the procedure. After the RHC was complete the 7FR sheath was exchanged over a wire for a triple lumen RIJ central line.   Complications: None apparent.  Findings:  RA =  12 RV =  67/12/15 PA = 64/33 (47) PCW = 31 Fick cardiac output/index = 5.5/2.5 Thermo CO/CI = 4.6/2.1 PVR = 4.4 WU FA sat = 99% PA sat = 57%, 59%  Assessment: 1. Elevated filling pressures 2. Low normal cardiac output  Plan/Discussion:  Admit for IV diuresis. Follow co-ox and CVP off central line.   Jolaine Artist MD 7:44 AM

## 2014-04-22 NOTE — H&P (View-Only) (Signed)
Patient ID: Antonio Bernard, male   DOB: 12/20/1932, 80 y.o.   MRN: [id number] PCP: Dr. Mackenzie Primary Cardiologist: Dr. Harding Surgeon: Dr. VanTrigt  HPI: [name] is a 80 yo male with a hx of HTN, severe anxiety, OSA , systolic HF due to ICM EF 25% 10/15/2013, S/P CABG x 2 LIMA-LAD; SVG-DIAG on 10/08/13.   On 10/05/2013 had late presentation with anterior MI. Underwent emergent LAD PCI followed by emergent CABG. Postoperative course was complicated by shock and cardiorenal syndrome which required lasix drip, milrinone and dopamine. His post operative course was also complicated by DVT in his R peroneal vein. As he improved all drips were weaned off and he was transferred to CIR on 10/24/13. He was later transferred back to acute care with volme overload and low output. Placed back on Milrinone and lasix drip. Later this was weaned off. Course also c/b orthostatic hypotension. EF 25%. Discharged home with LifeVest.  Lifevest stopped in December at his request.  Echo in 1/15 with EF 35-40%. Echo 03/27/14 EF 20-25% mild MR. RV ok. No apical clot  He underwent Pleurex catheter placement for L pleural effusion in 2/15 by Dr. Van Trigt.   He returns for follow. Had syncopal episode last Monday. Weight at home trending down 210 down to 204 pounds. Last week he was instructed to take additional torsemide for the last 5 days. SOB with exertion> + Orthopnea. Unable to to continue cardiac rehab. Has minimal output from pleurex catheter. Poor appetite. Daughter manages medications.     Labs 11/15/13 K 3.9 Creatinine 1.46, digoxin 0.6 Labs 03/12/14: K+ 4.2, Creatinine 1.2  ROS: All systems negative except as listed in HPI, PMH and Problem List.  Past Medical History  Diagnosis Date  . Acid reflux   . Family history of anesthesia complication     daughter had problem waking up  . Pneumonia     hx of  . Sleep apnea     no treatment for, last sleep study over 5 years ago  . Abdominal aortic  aneurysm     found 06/29/12 "small"  . Headache(784.0)   . Herniated vertebral disc     thoracic area  . Cataracts, bilateral     hx of  . H/O Legionnaire's disease     about 24 years ago  . Anxiety     panic attacks   . Claustrophobia     "severe"  . Hypertension     sees  Dr. Mckenzie [phone]  . Obesity (BMI 35.0-39.9 without comorbidity) 10/06/2013  . Essential hypertension 10/06/2013  . Anginal pain   . Atrial fibrillation     Postoperative  . Acute renal insufficiency     Postoperative  . Anemia associated with acute blood loss     Postoperative    Current Outpatient Prescriptions  Medication Sig Dispense Refill  . acetaminophen (TYLENOL) 325 MG tablet Take 2 tablets (650 mg total) by mouth every 6 (six) hours as needed for mild pain, fever or headache.      . ALPRAZolam (XANAX) 0.25 MG tablet Take 0.25-1 mg by mouth 4 (four) times daily as needed for anxiety. Take 0.25 mg through the day as needed and take 1mg at bedtime.      . Ascorbic Acid (VITAMIN C) 1000 MG tablet Take 1,000 mg by mouth daily.      . aspirin EC 81 MG tablet Take 81 mg by mouth daily.      . cholecalciferol (VITAMIN D) 1000 UNITS   tablet Take 2,000 Units by mouth daily.      . citalopram (CELEXA) 20 MG tablet Take 1 tablet (20 mg total) by mouth daily.  30 tablet  6  . digoxin (LANOXIN) 0.125 MG tablet Take 1 tablet (0.125 mg total) by mouth daily.  30 tablet  6  . docusate sodium (COLACE) 100 MG capsule Take 200 mg by mouth daily.      . feeding supplement, GLUCERNA SHAKE, (GLUCERNA SHAKE) LIQD Take 237 mLs by mouth 3 (three) times daily between meals as needed (meal replacement).      . Multiple Vitamin (MULTIVITAMIN WITH MINERALS) TABS tablet Take 1 tablet by mouth daily.      . nitroGLYCERIN (NITROSTAT) 0.3 MG SL tablet Place 1 tablet (0.3 mg total) under the tongue every 5 (five) minutes as needed for chest pain.  90 tablet  12  . polyethylene glycol (MIRALAX / GLYCOLAX) packet Take 17 g by  mouth daily as needed for mild constipation.      . potassium chloride SA (K-DUR,KLOR-CON) 20 MEQ tablet Take 0.5 tablets (10 mEq total) by mouth every other day. Only if he take torsemide  30 tablet  6  . torsemide (DEMADEX) 20 MG tablet Take 1 tablet (20 mg total) by mouth daily.  30 tablet  3  . traMADol (ULTRAM) 50 MG tablet TAKE 1 TABLET BY MOUTH EVERY 6 HOURS DAILY AS NEEDED FOR PAIN  30 tablet  0  . Wheat Dextrin (BENEFIBER PO) Take 10 mLs by mouth 2 (two) times daily.       No current facility-administered medications for this encounter.    Filed Vitals:   04/21/14 1442  BP: 128/64  Pulse: 78  Weight: 210 lb 12.8 oz (95.618 kg)  SpO2: 93%   PHYSICAL EXAM: General:  Weak appearing.  HEENT: normal Neck: supple. JVP 9 cm. Carotids 2+ bilaterally; no bruits. No lymphadenopathy or thryomegaly appreciated. Cor: PMI normal. Regular rate & rhythm. No rubs, gallops or murmurs. Sternal incision healed.   Lungs: Clear   decreased R base Abdomen: soft, nontender, nondistended. No hepatosplenomegaly. No bruits or masses. Good bowel sounds. Pleurex cath ok.  Extremities: no cyanosis, clubbing, rash, 2-3+edema bilaterally.  Neuro: alert & orientedx3, cranial nerves grossly intact. Moves all 4 extremities w/o difficulty. Affect pleasant.  ASSESSMENT & PLAN: 1.Chronic Systolic Heart Failure: Echo 4/15 EF 20%. NYHA IIIb-IV.   Plan for RHC tomorrow. Offered hospital admit however he declines.   If low output will admit for home inotropes.  Continue current regimen for now and adjust diuretics post  RHC.  2) CAD: Recent anterior STEMI, S/P CABG x 2 10/08/13 LIMA-LAD; SVG-DIAG on 10/08/13 . No chest pain. Myalgias with atorvastatin.  He is quite reticent to try another statin. Now that he is off Xarelto will start ASA 81.  3) Anxiety: Significant.  On chronic Xanax 3-4 times a day at home and celexa 20 mg daily.    4) RLE DVT: Post-op.  Now off Xarelto. 5) L Pleural Effusion: S/P Pleurex catheter  on L. No longer draining. Per Dr Darcey Nora  6) Lightheadedness/presyncope: has h/o orthostatic hypotension.   - Refuses compression stockings.   Follow up in 2 weeks.  [name]  04/21/2014  Patient seen and examined with [name], NP. We discussed all aspects of the encounter. I agree with the assessment and plan as stated above.   He continues to struggle with NYHA IIIB-IV symptoms. He is willing to consider inotropes at this  point. We will proceed with RHC in the am tomorrow to further evaluate. If low output will admit for initiation of dobutamine. Cath discussed in detail.   March Steyer R Kienan Doublin,MD 3:48 PM   

## 2014-04-22 NOTE — Interval H&P Note (Signed)
History and Physical Interval Note:  04/22/2014 7:44 AM  Antonio Bernard  has presented today for surgery, with the diagnosis of HEART FAILURE  The various methods of treatment have been discussed with the patient and family. After consideration of risks, benefits and other options for treatment, the patient has consented to  Procedure(s): RIGHT HEART CATH (N/A) as a surgical intervention .  The patient's history has been reviewed, patient examined, no change in status, stable for surgery.  I have reviewed the patient's chart and labs.  Questions were answered to the patient's satisfaction.     Shaune Pascal Shraga Custard

## 2014-04-22 NOTE — Care Management Note (Addendum)
    Page 1 of 1   04/24/2014     11:11:51 AM CARE MANAGEMENT NOTE 04/24/2014  Patient:  THANG, FLETT   Account Number:  [id number]  Date Initiated:  04/22/2014  Documentation initiated by:  Elissa Hefty  Subjective/Objective Assessment:   adm w heart failure     Action/Plan:   lives alone, pcp dr Teena Irani, hx w [facility]   Anticipated DC Date:  04/24/2014   Anticipated DC Plan:  [place]  CM consult      [facility] Choice  HOME HEALTH   Choice offered to / List presented to:  C-1 Patient        [place] arranged  [place] - 11 Patient Refused      Status of service:   Medicare Important Message given?   (If response is "NO", the following Medicare IM given date fields will be blank) Date Medicare IM given:   Date Additional Medicare IM given:    Discharge Disposition:  HOME/SELF CARE  Per UR Regulation:  Reviewed for med. necessity/level of care/duration of stay  If discussed at [place] of Stay Meetings, dates discussed:    Comments:  5/21 1007a debbie Toure [place] rn,bsn spoke w pt. he has used [facility] in past and would like them again. ref to donna w [facility] for hhrn=tele health=phy ther. pt for dc home today. pt's da in and they have decided not to have hhc. they prefer card rehab. spoke w amy clegg to let her know pt and fam have decided not to have hhc but want to cont card rehab. they moniter bp and wts daily per da.

## 2014-04-22 NOTE — H&P (Signed)
Advanced Heart Failure Team History and Physical Note    HPI:    [name] is a 80 yo male with a hx of HTN, severe anxiety, OSA , systolic HF due to ICM EF 25% 10/15/2013, S/P CABG x 2 LIMA-LAD; SVG-DIAG on 10/08/13.   On 10/05/2013 had late presentation with anterior MI. Underwent emergent LAD PCI followed by emergent CABG. Postoperative course was complicated by shock and cardiorenal syndrome which required lasix drip, milrinone and dopamine. His post operative course was also complicated by DVT in his R peroneal vein. As he improved all drips were weaned off and he was transferred to CIR on 10/24/13. He was later transferred back to acute care with volme overload and low output. Placed back on Milrinone and lasix drip. Later this was weaned off. Course also c/b orthostatic hypotension. EF 25%. Discharged home with LifeVest.   Lifevest stopped in December at his request. Echo in 1/15 with EF 35-40%.   Echo 03/27/14 EF 20-25% mild MR. RV ok. No apical clot   He underwent Pleurex catheter placement for L pleural effusion in 2/15 by Dr. Prescott Gum.   Seen in Clinic multiple times this month with volume overload and low output symptoms. Underwent RHC today for further evaluation and consideration of inotropes. RHC showed low normal CO with elevated filling pressures. Being admitted for HF tune-up  RHC 04/22/14  RA = 12  RV = 67/12/15  PA = 64/33 (47)  PCW = 31  Fick cardiac output/index = 5.5/2.5  Thermo CO/CI = 4.6/2.1  PVR = 4.4 WU  FA sat = 99%  PA sat = 57%, 59%    Labs 11/15/13 K 3.9 Creatinine 1.46, digoxin 0.6  Labs 03/12/14: K+ 4.2, Creatinine 1.2   Review of Systems: [y] = yes, [ ]  = no   General: Weight gain [ ] ; Weight loss [ y]; Anorexia Blue.Reese ]; Fatigue [ y]; Fever [ ] ; Chills [ ] ; Weakness [ ]   Cardiac: Chest pain/pressure [ ] ; Resting SOB [ y]; Exertional SOB Blue.Reese ]; Orthopnea Blue.Reese ]; Pedal Edema [ y]; Palpitations [ ] ; Syncope [ ] ; Presyncope [ ] ; Paroxysmal nocturnal  dyspnea[y ]  Pulmonary: Cough [ ] ; Wheezing[ ] ; Hemoptysis[ ] ; Sputum [ ] ; Snoring [ ]   GI: Vomiting[ ] ; Dysphagia[ ] ; Melena[ ] ; Hematochezia [ ] ; Heartburn[ ] ; Abdominal pain [ ] ; Constipation [ ] ; Diarrhea [ ] ; BRBPR [ ]   GU: Hematuria[ ] ; Dysuria [ ] ; Nocturia[ ]   Vascular: Pain in legs with walking [ ] ; Pain in feet with lying flat [ ] ; Non-healing sores [ ] ; Stroke [ ] ; TIA [ ] ; Slurred speech [ ] ;  Neuro: Headaches[ ] ; Vertigo[ ] ; Seizures[ ] ; Paresthesias[ ] ;Blurred vision [ ] ; Diplopia [ ] ; Vision changes [ ]   Ortho/Skin: Arthritis Blue.Reese ]; Joint pain Blue.Reese ]; Muscle pain [ ] ; Joint swelling [ ] ; Back Pain [ ] ; Rash [ ]   Psych: Depression[ y]; Anxiety[ ]   Heme: Bleeding problems [ ] ; Clotting disorders [ ] ; Anemia [ ]   Endocrine: Diabetes [ ] ; Thyroid dysfunction[ ]   Home Medications Prior to Admission medications   Medication Sig Start Date End Date Taking? Authorizing Provider  acetaminophen (TYLENOL) 325 MG tablet Take 2 tablets (650 mg total) by mouth every 6 (six) hours as needed for mild pain, fever or headache. 11/15/13  Yes Delfina Redwood, MD  ALPRAZolam Duanne Moron) 0.25 MG tablet Take 0.25-1 mg by mouth 4 (four) times daily as needed for anxiety. Take 0.25 mg through the day as  needed and take 1mg  at bedtime.   Yes Historical Provider, MD  Ascorbic Acid (VITAMIN C) 1000 MG tablet Take 1,000 mg by mouth daily.   Yes Historical Provider, MD  aspirin EC 81 MG tablet Take 81 mg by mouth daily.   Yes Historical Provider, MD  cholecalciferol (VITAMIN D) 1000 UNITS tablet Take 2,000 Units by mouth daily.   Yes Historical Provider, MD  citalopram (CELEXA) 20 MG tablet Take 1 tablet (20 mg total) by mouth daily. 11/22/13  Yes Amy D Clegg, NP  digoxin (LANOXIN) 0.125 MG tablet Take 1 tablet (0.125 mg total) by mouth daily. 11/08/13  Yes Amy D Clegg, NP  docusate sodium (COLACE) 100 MG capsule Take 200 mg by mouth daily.   Yes Historical Provider, MD  Multiple Vitamin (MULTIVITAMIN WITH  MINERALS) TABS tablet Take 1 tablet by mouth daily.   Yes Historical Provider, MD  nitroGLYCERIN (NITROSTAT) 0.3 MG SL tablet Place 1 tablet (0.3 mg total) under the tongue every 5 (five) minutes as needed for chest pain. 11/08/13  Yes Amy D Clegg, NP  polyethylene glycol (MIRALAX / GLYCOLAX) packet Take 17 g by mouth daily as needed for mild constipation.   Yes Historical Provider, MD  potassium chloride SA (K-DUR,KLOR-CON) 20 MEQ tablet Take 0.5 tablets (10 mEq total) by mouth every other day. Only if he take torsemide 03/14/14  Yes Larey Dresser, MD  torsemide (DEMADEX) 20 MG tablet Take 1 tablet (20 mg total) by mouth daily. 04/14/14  Yes Jolaine Artist, MD  traMADol (ULTRAM) 50 MG tablet TAKE 1 TABLET BY MOUTH EVERY 6 HOURS DAILY AS NEEDED FOR PAIN 03/03/14  Yes Donielle Liston Alba, PA-C  Wheat Dextrin (BENEFIBER PO) Take 10 mLs by mouth 2 (two) times daily.   Yes Historical Provider, MD    Past Medical History: Past Medical History  Diagnosis Date  . Acid reflux   . Family history of anesthesia complication     daughter had problem waking up  . Pneumonia     hx of  . Sleep apnea     no treatment for, last sleep study over 5 years ago  . Abdominal aortic aneurysm     found 06/29/12 "small"  . Headache(784.0)   . Herniated vertebral disc     thoracic area  . Cataracts, bilateral     hx of  . H/O Legionnaire's disease     about 24 years ago  . Anxiety     panic attacks   . Claustrophobia     "severe"  . Hypertension     sees  Dr. Alyson Ingles [phone]  . Obesity (BMI 35.0-39.9 without comorbidity) 10/06/2013  . Essential hypertension 10/06/2013  . Anginal pain   . Atrial fibrillation     Postoperative  . Acute renal insufficiency     Postoperative  . Anemia associated with acute blood loss     Postoperative    Past Surgical History: Past Surgical History  Procedure Laterality Date  . Shoulder surgery      left  . Cataract extraction w/ intraocular lens implant       bilateral  . Lumbar laminectomy/decompression microdiscectomy  07/20/2012    Procedure: LUMBAR LAMINECTOMY/DECOMPRESSION MICRODISCECTOMY 1 LEVEL;  Surgeon: Faythe Ghee, MD;  Location: [facility];  Service: Neurosurgery;  Laterality: N/A;  lumbar four-five left  . Back surgery    . Cardiac catheterization  10/05/2013  . Eye surgery    . Coronary artery bypass graft N/A 10/08/2013  Procedure: CORONARY ARTERY BYPASS GRAFTING (CABG);  Surgeon: Ivin Poot, MD;  Location: [place];  Service: Open Heart Surgery;  Laterality: N/A;  Times 2 using left internal mammary artery and endoscopically harvested left saphenous vein  . Intraoperative transesophageal echocardiogram N/A 10/08/2013    Procedure: INTRAOPERATIVE TRANSESOPHAGEAL ECHOCARDIOGRAM;  Surgeon: Ivin Poot, MD;  Location: [place];  Service: Open Heart Surgery;  Laterality: N/A;  . Chest tube insertion Left 01/14/2014    Procedure: INSERTION PLEURAL DRAINAGE CATHETER;  Surgeon: Ivin Poot, MD;  Location: [place];  Service: Thoracic;  Laterality: Left;    Family History: No family history on file.  Social History: History   Social History  . Marital Status: Widowed    Spouse Name: N/A    Number of Children: N/A  . Years of Education: N/A   Occupational History  . Retired    Social History Main Topics  . Smoking status: Former Smoker -- 0.50 packs/day    Types: Cigarettes, Cigars  . Smokeless tobacco: Never Used     Comment: 50 years ago  . Alcohol Use: No  . Drug Use: No  . Sexual Activity: No   Other Topics Concern  . Not on file   Social History Narrative   Was living alone prior to admission.    Allergies:  Allergies  Allergen Reactions  . Demerol [Meperidine] Other (See Comments)    "makes me crazy"  . Lisinopril Diarrhea  . Shellfish Allergy Rash    Objective:    Vital Signs:   Temp:  [98.1 F (36.7 C)] 98.1 F (36.7 C) (05/19 0637) Pulse Rate:  [77-78] 77 (05/19 0637) Resp:  [20] 20  (05/19 0637) BP: (128-143)/(64-74) 143/74 mmHg (05/19 0637) SpO2:  [93 %-94 %] 94 % (05/19 0637) Weight:  [93.895 kg (207 lb)-95.618 kg (210 lb 12.8 oz)] 93.895 kg (207 lb) (05/19 0637)   Filed Weights   04/22/14 0637  Weight: 93.895 kg (207 lb)    Physical Exam: General: Weak appearing.  HEENT: normal  Neck: supple. RIJ central line. Carotids 2+ bilaterally; no bruits. No lymphadenopathy or thryomegaly appreciated.  Cor: PMI normal. Regular rate & rhythm. No rubs, gallops or murmurs. Sternal incision healed.  Lungs: Clear decreased R base  Abdomen: soft, nontender, nondistended. No hepatosplenomegaly. No bruits or masses. Good bowel sounds. Pleurex cath ok.  Extremities: no cyanosis, clubbing, rash, 2+edema bilaterally.  Neuro: alert & orientedx3, cranial nerves grossly intact. Moves all 4 extremities w/o difficulty. Affect pleasant.   Telemetry: Sinus rhythm  Labs: Basic Metabolic Panel:  Recent Labs Lab 04/21/14 1545  NA 140  K 3.9  CL 99  CO2 33*  GLUCOSE 101*  BUN 24*  CREATININE 1.05  CALCIUM 9.2    Liver Function Tests: No results found for this basename: AST, ALT, ALKPHOS, BILITOT, PROT, ALBUMIN,  in the last 168 hours No results found for this basename: LIPASE, AMYLASE,  in the last 168 hours No results found for this basename: AMMONIA,  in the last 168 hours  CBC:  Recent Labs Lab 04/21/14 1545  WBC 8.1  HGB 9.5*  HCT 30.4*  MCV 86.4  PLT 446*    Cardiac Enzymes: No results found for this basename: CKTOTAL, CKMB, CKMBINDEX, TROPONINI,  in the last 168 hours  BNP: BNP (last 3 results)  Recent Labs  11/14/13 2009 12/18/13 1608 01/16/14 1243  PROBNP 18488.0* 11976.0* 7699.0*    CBG: No results found for this basename: GLUCAP,  in the last  168 hours  Coagulation Studies:  Recent Labs  04/21/14 1545  LABPROT 14.4  INR 1.14      Imaging:  No results found.      Assessment:   1. A/C systolic HF 2. Ischemic CM 3.  Pleural effusion s/p pleurex catheter 4. CAD s/p CABG 5. H/o orthostatic hypotension    Plan/Discussion:     Will admit for diuresis. Follow co-ox and if output drops can consider home dobutamine. Pleurex cath no longer draining so can discuss with PVT about pulling Pleurex while in house.   Shaune Pascal Ki Luckman MD 04/22/2014, 8:23 AM  Advanced Heart Failure Team Pager [phone] (M-F; 7a - 4p)  Please contact [facility] for night-coverage after hours (4p -7a ) and weekends on [email or web address]

## 2014-04-23 ENCOUNTER — Encounter (HOSPITAL_COMMUNITY): Payer: Medicare Other

## 2014-04-23 ENCOUNTER — Ambulatory Visit: Payer: PRIVATE HEALTH INSURANCE | Admitting: Cardiothoracic Surgery

## 2014-04-23 DIAGNOSIS — I5023 Acute on chronic systolic (congestive) heart failure: Principal | ICD-10-CM

## 2014-04-23 DIAGNOSIS — J90 Pleural effusion, not elsewhere classified: Secondary | ICD-10-CM

## 2014-04-23 LAB — CARBOXYHEMOGLOBIN
Carboxyhemoglobin: 1.6 % — ABNORMAL HIGH (ref 0.5–1.5)
METHEMOGLOBIN: 0.8 % (ref 0.0–1.5)
O2 Saturation: 56.3 %
TOTAL HEMOGLOBIN: 8.9 g/dL — AB (ref 13.5–18.0)

## 2014-04-23 LAB — BASIC METABOLIC PANEL
BUN: 19 mg/dL (ref 6–23)
CALCIUM: 8.7 mg/dL (ref 8.4–10.5)
CHLORIDE: 97 meq/L (ref 96–112)
CO2: 37 meq/L — AB (ref 19–32)
Creatinine, Ser: 0.92 mg/dL (ref 0.50–1.35)
GFR calc Af Amer: >90 mL/min (ref >90)
GFR calc non Af Amer: 79 mL/min — ABNORMAL LOW (ref >90)
Glucose, Bld: 78 mg/dL (ref 70–99)
Potassium: 3.1 mEq/L — ABNORMAL LOW (ref 3.7–5.3)
Sodium: 142 mEq/L (ref 137–147)

## 2014-04-23 MED ORDER — POTASSIUM CHLORIDE CRYS ER 20 MEQ PO TBCR
60.0000 meq | EXTENDED_RELEASE_TABLET | Freq: Once | ORAL | Status: AC
  Administered 2014-04-23: 60 meq via ORAL
  Filled 2014-04-23: qty 3

## 2014-04-23 MED ORDER — POTASSIUM CHLORIDE CRYS ER 20 MEQ PO TBCR
40.0000 meq | EXTENDED_RELEASE_TABLET | Freq: Two times a day (BID) | ORAL | Status: AC
  Administered 2014-04-23 (×2): 40 meq via ORAL
  Filled 2014-04-23: qty 2

## 2014-04-23 MED ORDER — SPIRONOLACTONE 12.5 MG HALF TABLET
12.5000 mg | ORAL_TABLET | Freq: Every day | ORAL | Status: DC
  Administered 2014-04-23 – 2014-04-24 (×2): 12.5 mg via ORAL
  Filled 2014-04-23 (×2): qty 1

## 2014-04-23 MED ORDER — MIDAZOLAM HCL 2 MG/2ML IJ SOLN
2.0000 mg | Freq: Once | INTRAMUSCULAR | Status: AC
  Administered 2014-04-23: 2 mg via INTRAVENOUS
  Filled 2014-04-23: qty 2

## 2014-04-23 NOTE — Progress Notes (Signed)
Advanced Heart Failure Rounding Note   Subjective:    [name] is a 80 yo male with a hx of HTN, severe anxiety, OSA , systolic HF due to ICM EF 25% 10/15/2013, S/P CABG x 2 LIMA-LAD; SVG-DIAG on 10/08/13.   Yesterday he was admitted from the cath lab with volume overload. Started on IV lasix. Weight down 8 pounds. Denies SOB/Orhtopnea.   RHC 04/22/14  RA = 12  RV = 67/12/15  PA = 64/33 (47)  PCW = 31  Fick cardiac output/index = 5.5/2.5  Thermo CO/CI = 4.6/2.1  PVR = 4.4 WU  FA sat = 99%  PA sat = 57%, 59  CO-OX 56%  Creatinine 0.92 Objective:   Weight Range:  Vital Signs:   Temp:  [97.6 F (36.4 C)-98.5 F (36.9 C)] 97.6 F (36.4 C) (05/20 0800) Pulse Rate:  [70-87] 72 (05/20 0400) Resp:  [14-24] 23 (05/20 0800) BP: (121-168)/(49-87) 123/58 mmHg (05/20 0800) SpO2:  [97 %-100 %] 99 % (05/20 0800) Weight:  [195 lb 15.8 oz (88.9 kg)] 195 lb 15.8 oz (88.9 kg) (05/20 0400) Last BM Date: 04/21/14  Weight change: Filed Weights   04/22/14 0637 04/23/14 0400  Weight: 207 lb (93.895 kg) 195 lb 15.8 oz (88.9 kg)    Intake/Output:   Intake/Output Summary (Last 24 hours) at 04/23/14 0856 Last data filed at 04/23/14 0200  Gross per 24 hour  Intake    200 ml  Output   3850 ml  Net  -3650 ml     Physical Exam: General:  Well appearing. No resp difficulty Sitting on the side of the bed.  HEENT: normal Neck: supple. JVP 8-9 . Carotids 2+ bilat; no bruits. No lymphadenopathy or thryomegaly appreciated. Cor: PMI nondisplaced. Regular rate & rhythm. No rubs, gallops or murmurs. Lungs: clear Abdomen: soft, nontender, nondistended. No hepatosplenomegaly. No bruits or masses. Good bowel sounds. Extremities: no cyanosis, clubbing, rash, R and LLE 1-2 edema Neuro: alert & orientedx3, cranial nerves grossly intact. moves all 4 extremities w/o difficulty. Affect pleasant  Telemetry: SR  Labs: Basic Metabolic Panel:  Recent Labs Lab 04/21/14 1545 04/22/14 1500  04/23/14 0430  NA 140  --  142  K 3.9  --  3.1*  CL 99  --  97  CO2 33*  --  37*  GLUCOSE 101*  --  78  BUN 24*  --  19  CREATININE 1.05 0.89 0.92  CALCIUM 9.2  --  8.7    Liver Function Tests: No results found for this basename: AST, ALT, ALKPHOS, BILITOT, PROT, ALBUMIN,  in the last 168 hours No results found for this basename: LIPASE, AMYLASE,  in the last 168 hours No results found for this basename: AMMONIA,  in the last 168 hours  CBC:  Recent Labs Lab 04/21/14 1545 04/22/14 1500  WBC 8.1 7.6  HGB 9.5* 9.6*  HCT 30.4* 30.5*  MCV 86.4 87.1  PLT 446* 505*    Cardiac Enzymes: No results found for this basename: CKTOTAL, CKMB, CKMBINDEX, TROPONINI,  in the last 168 hours  BNP: BNP (last 3 results)  Recent Labs  11/14/13 2009 12/18/13 1608 01/16/14 1243  PROBNP 18488.0* 11976.0* 7699.0*     Other results:    Imaging:  No results found.   Medications:     Scheduled Medications: . ALPRAZolam  1 mg Oral QHS  . aspirin EC  81 mg Oral Daily  . cholecalciferol  1,000 Units Oral Daily  . citalopram  20 mg Oral  Daily  . digoxin  0.125 mg Oral Daily  . enoxaparin (LOVENOX) injection  40 mg Subcutaneous Q24H  . furosemide  80 mg Intravenous BID  . potassium chloride  20 mEq Oral BID  . sodium chloride  3 mL Intravenous Q12H  . sodium chloride  3 mL Intravenous Q12H     Infusions:     PRN Medications:  sodium chloride, sodium chloride, acetaminophen, ALPRAZolam, ondansetron (ZOFRAN) IV, sodium chloride, sodium chloride   Assessment:   1. A/C systolic HF  2. Ischemic CM  3. Pleural effusion s/p pleurex catheter  4. CAD s/p CABG  5. H/o orthostatic hypotension 6. Hypokalemia  7. H/O of RLE DVT    Plan/Discussion:    Brisk diuresis over night. Weight down 8 pounds. Continue IV lasix today and likely transition to po. Check CVPs. Add 12.5 mg spironolactone. He has also been intolerant BB due to hypotension. Can consider Arb tomorrow.  Intolerant Ace due to diarrhea. Add ted hose. Renal function stable.    No evidence of ischemia. Continue aspirin. Intolerant statins. Due to myalgias.   Cardiac Rehab pending.  Length of Stay: 1   [name]  04/23/2014, 8:56 AM  Advanced Heart Failure Team Pager [phone] (M-F; [place])  Please contact [facility] for night-coverage after hours (4p -7a ) and weekends on [email or web address]  Patient seen and examined with [name], NP. We discussed all aspects of the encounter. I agree with the assessment and plan as stated above. Good diuresis with IV lasix. Co-ox stable. Would check CVP and base further diuresis on that. Supp K+. Watch co-ox for at least one more day. If dropping, can consider trial of dobutamine.  Will discuss pulling Pleurex with Dr. Prescott Gum.   Shaune Pascal Mirta Mally,MD 9:23 AM

## 2014-04-23 NOTE — Progress Notes (Signed)
PT Cancellation Note  Patient Details Name: Antonio Bernard MRN: [id number] DOB: 08/25/1933   Cancelled Treatment:    Reason Eval/Treat Not Completed: Medical issues which prohibited therapy. Per pt, he was told by Dr. Prescott Gum not to get OOB until after 11:30 a.m. Due to removal of pleurex catheter. Pt does not think he needs PT. Daughter would like for [place] to return. Will attempt as schedule allows.   [name] 04/23/2014, 11:05 AM Pager [phone]

## 2014-04-23 NOTE — Op Note (Signed)
CT surgery operative note  Procedure-removal of left Pleurx catheter  Surgeon-Nycere Presley Prescott Gum M.D.  Pre-and postoperative diagnosis-recurrent left pleural effusion secondary to advanced chronic systolic heart failure, status post MI followed by CABG last year  Anesthesia local 1% lidocaine  Operative procedure After informed consent was obtained for removal of the Pleurx catheter- because the left effusion has resolved--  a proper timeout was performed. The left chest in the area of the catheter was prepped and draped as a sterile field. Local 1% lidocaine was infiltrated around the exit site of the catheter. The exit site was extended with a scalpel. Subcutaneous tissue around the Dacron cuff was Dissected free up the catheter so the catheter was pulled completely out. 2 silk sutures are placed in the incision and a sterile dressing was applied. The patient tolerated the procedure well.

## 2014-04-24 DIAGNOSIS — I2510 Atherosclerotic heart disease of native coronary artery without angina pectoris: Secondary | ICD-10-CM

## 2014-04-24 LAB — BASIC METABOLIC PANEL
BUN: 15 mg/dL (ref 6–23)
CHLORIDE: 109 meq/L (ref 96–112)
CO2: 30 meq/L (ref 19–32)
CREATININE: 0.77 mg/dL (ref 0.50–1.35)
Calcium: 6.6 mg/dL — ABNORMAL LOW (ref 8.4–10.5)
GFR calc Af Amer: >90 mL/min (ref >90)
GFR calc non Af Amer: 85 mL/min — ABNORMAL LOW (ref >90)
GLUCOSE: 63 mg/dL — AB (ref 70–99)
POTASSIUM: 2.8 meq/L — AB (ref 3.7–5.3)
Sodium: 148 mEq/L — ABNORMAL HIGH (ref 137–147)

## 2014-04-24 LAB — CARBOXYHEMOGLOBIN
CARBOXYHEMOGLOBIN: 1.8 % — AB (ref 0.5–1.5)
METHEMOGLOBIN: 0.4 % (ref 0.0–1.5)
O2 Saturation: 69.8 %
Total hemoglobin: 8.8 g/dL — ABNORMAL LOW (ref 13.5–18.0)

## 2014-04-24 LAB — POTASSIUM: POTASSIUM: 4 meq/L (ref 3.7–5.3)

## 2014-04-24 MED ORDER — POTASSIUM CHLORIDE CRYS ER 20 MEQ PO TBCR: 20.0000 meq | EXTENDED_RELEASE_TABLET | Freq: Every day | ORAL | Status: DC

## 2014-04-24 MED ORDER — POTASSIUM CHLORIDE CRYS ER 20 MEQ PO TBCR: 80.0000 meq | EXTENDED_RELEASE_TABLET | Freq: Once | ORAL | Status: DC

## 2014-04-24 MED ORDER — TORSEMIDE 20 MG PO TABS: 20.0000 mg | ORAL_TABLET | Freq: Every day | ORAL | Status: AC

## 2014-04-24 MED ORDER — TORSEMIDE 20 MG PO TABS: 20.0000 mg | ORAL_TABLET | Freq: Every day | ORAL | Status: DC

## 2014-04-24 MED ORDER — POTASSIUM CHLORIDE ER 10 MEQ PO TBCR: 10.0000 meq | EXTENDED_RELEASE_TABLET | ORAL | Status: DC | PRN

## 2014-04-24 MED ORDER — POTASSIUM CHLORIDE CRYS ER 20 MEQ PO TBCR: 40.0000 meq | EXTENDED_RELEASE_TABLET | Freq: Two times a day (BID) | ORAL | Status: DC

## 2014-04-24 MED ORDER — SPIRONOLACTONE 12.5 MG HALF TABLET: 12.5000 mg | ORAL_TABLET | Freq: Every day | ORAL | Status: AC

## 2014-04-24 MED ORDER — POTASSIUM CHLORIDE CRYS ER 20 MEQ PO TBCR
40.0000 meq | EXTENDED_RELEASE_TABLET | ORAL | Status: AC
  Administered 2014-04-24 (×2): 40 meq via ORAL
  Filled 2014-04-24: qty 2
  Filled 2014-04-24: qty 4

## 2014-04-24 NOTE — Progress Notes (Addendum)
Dr. Haroldine Laws paged regarding critical potassium value of 2.8. Will continue to monitor. - Soyla Dryer, RN   [phone]: [name], NP returned phone call and said issue would be addressed. Will continue to monitor. - Soyla Dryer, RN

## 2014-04-24 NOTE — Progress Notes (Signed)
CARDIAC REHAB PHASE I   PRE:  Rate/Rhythm: 81 SR  BP:  Supine: 126/63  Sitting:   Standing:    SaO2: 992L  MODE:  Ambulation: 168 ft   POST:  Rate/Rhythm: 86 SR  BP:  Supine:   Sitting: 101/44  Standing:    SaO2: 85RA  952L 1415-1500 Assisted X 1 and used walker to ambulate. Gait steady with walker. Pt is weak and tires with walking. Pt to recline rafter walk with call light in reach. Room air sat after walk 85%. O2 replaced 2L sat increased to 95%. Pt states that he felt SOB with walking. He states that he can not believe how weak he feels. He was able to walk 168 feet.  Ashlee Bewley RN 04/23/2014 3:33 PM    

## 2014-04-24 NOTE — Progress Notes (Signed)
Advanced Heart Failure Rounding Note   Subjective:    [name] is a 80 yo male with a hx of HTN, severe anxiety, OSA , systolic HF due to ICM EF 25% 10/15/2013, S/P CABG x 2 LIMA-LAD; SVG-DIAG on 10/08/13.   Admitted from cath lab with volume overload. Started on IV lasix. Brisk diuresis overnight. Weight down another 4 pounds. Pleurex catheter removed per Dr Darcey Nora.    Overall feeling better. Denies SOB/Orhtopnea.   RHC 04/22/14  RA = 12  RV = 67/12/15  PA = 64/33 (47)  PCW = 31  Fick cardiac output/index = 5.5/2.5  Thermo CO/CI = 4.6/2.1  PVR = 4.4 WU  FA sat = 99%  PA sat = 57%, 59  CO-OX 69%  Creatinine 0.77 Objective:   Weight Range:  Vital Signs:   Temp:  [97.7 F (36.5 C)-98.9 F (37.2 C)] 98.6 F (37 C) (05/21 0800) Pulse Rate:  [68-81] 68 (05/20 2350) Resp:  [14-22] 14 (05/20 2350) BP: (107-151)/(57-73) 143/71 mmHg (05/21 0800) SpO2:  [98 %-100 %] 100 % (05/21 0800) Weight:  [191 lb 9.3 oz (86.9 kg)] 191 lb 9.3 oz (86.9 kg) (05/21 0500) Last BM Date: 04/23/14  Weight change: Filed Weights   04/22/14 0637 04/23/14 0400 04/24/14 0500  Weight: 207 lb (93.895 kg) 195 lb 15.8 oz (88.9 kg) 191 lb 9.3 oz (86.9 kg)    Intake/Output:   Intake/Output Summary (Last 24 hours) at 04/24/14 0908 Last data filed at 04/23/14 2300  Gross per 24 hour  Intake    570 ml  Output   2675 ml  Net  -2105 ml     Physical Exam: General:  Well appearing. No resp difficulty Lying in bed.   HEENT: normal Neck: supple. JVP 8-9 . Carotids 2+ bilat; no bruits. No lymphadenopathy or thryomegaly appreciated. R neck central line.  Cor: PMI nondisplaced. Regular rate & rhythm. No rubs, gallops or murmurs. Lungs: clear Abdomen: soft, nontender, nondistended. No hepatosplenomegaly. No bruits or masses. Good bowel sounds. Extremities: no cyanosis, clubbing, rash, R and LLE  Trace edema Neuro: alert & orientedx3, cranial nerves grossly intact. moves all 4 extremities w/o difficulty.  Affect pleasant  Telemetry: SR  Labs: Basic Metabolic Panel:  Recent Labs Lab 04/21/14 1545 04/22/14 1500 04/23/14 0430 04/24/14 0500  NA 140  --  142 148*  K 3.9  --  3.1* 2.8*  CL 99  --  97 109  CO2 33*  --  37* 30  GLUCOSE 101*  --  78 63*  BUN 24*  --  19 15  CREATININE 1.05 0.89 0.92 0.77  CALCIUM 9.2  --  8.7 6.6*    Liver Function Tests: No results found for this basename: AST, ALT, ALKPHOS, BILITOT, PROT, ALBUMIN,  in the last 168 hours No results found for this basename: LIPASE, AMYLASE,  in the last 168 hours No results found for this basename: AMMONIA,  in the last 168 hours  CBC:  Recent Labs Lab 04/21/14 1545 04/22/14 1500  WBC 8.1 7.6  HGB 9.5* 9.6*  HCT 30.4* 30.5*  MCV 86.4 87.1  PLT 446* 505*    Cardiac Enzymes: No results found for this basename: CKTOTAL, CKMB, CKMBINDEX, TROPONINI,  in the last 168 hours  BNP: BNP (last 3 results)  Recent Labs  11/14/13 2009 12/18/13 1608 01/16/14 1243  PROBNP 18488.0* 11976.0* 7699.0*     Other results:    Imaging: No results found.   Medications:     Scheduled  Medications: . ALPRAZolam  1 mg Oral QHS  . aspirin EC  81 mg Oral Daily  . cholecalciferol  1,000 Units Oral Daily  . citalopram  20 mg Oral Daily  . digoxin  0.125 mg Oral Daily  . enoxaparin (LOVENOX) injection  40 mg Subcutaneous Q24H  . furosemide  80 mg Intravenous BID  . potassium chloride  40 mEq Oral BID  . potassium chloride  40 mEq Oral Q4H  . sodium chloride  3 mL Intravenous Q12H  . sodium chloride  3 mL Intravenous Q12H  . spironolactone  12.5 mg Oral Daily    Infusions:    PRN Medications: sodium chloride, sodium chloride, acetaminophen, ALPRAZolam, ondansetron (ZOFRAN) IV, sodium chloride, sodium chloride   Assessment:   1. A/C systolic HF  2. Ischemic CM  3. Pleural effusion s/p pleurex catheter  4. CAD s/p CABG  5. H/o orthostatic hypotension 6. Hypokalemia  7. H/O of RLE DVT     Plan/Discussion:    Brisk diuresis over night. Weight down another 4 pounds. Overall his weight is down 16 pounds. Volume status improved. Stop IV lasix start torsemide 20 mg daily tomorrow and continue 12.5 spironolactone daily.   Intolerant BB due to hypotension. Can consider Arb at outpatient appointments. Intolerant Ace due to diarrhea.Renal function stable.    No evidence of ischemia. Continue aspirin. Intolerant statins. Due to myalgias.   Pleurex catheter removed yesterday.   Home to day with [facility] and HHPT.  Follow up in HF clinic next week.    Length of Stay: 2 [name]  04/24/2014, 9:08 AM  Advanced Heart Failure Team Pager [phone] (M-F; 7a - 4p)  Please contact [facility] for night-coverage after hours (4p -7a ) and weekends on [email or web address]  Patient seen and examined with [name], NP. We discussed all aspects of the encounter. I agree with the assessment and plan as stated above.   Much improved after 16 pound diuresis. BP and co-ox stable. CVP now 7. Does not meet criteria for dobutamine.  Can go home today after K+ supped. Await PT recs. Currently planning for [facility] and HHPT. If HHPT not candidate for returning to [facility] just yet.   Will d/c on demadex 20 daily. Goal weight 190-193.  [name],MD 10:21 AM

## 2014-04-24 NOTE — Progress Notes (Signed)
PT Cancellation Note  Patient Details Name: Antonio Bernard MRN: [id number] DOB: 1933/09/27   Cancelled Treatment:    Reason Eval/Treat Not Completed: Medical issues which prohibited therapy. K+ 2.8 and has not yet been repleted (several doses scheduled to be given). Will see after repletion.   [name] [name] 04/24/2014, 8:31 AM Pager [phone]

## 2014-04-24 NOTE — Evaluation (Signed)
Physical Therapy Evaluation Patient Details Name: Antonio Bernard MRN: [id number] DOB: 11/16/1933 Today's Date: 04/24/2014   History of Present Illness  Adm 5/19 for Rt heart cath and noted to be in acute heart failure. Lt pleurex chest tube removed 5/20 (was inserted 01/2014 for Lt pleural effusion) PMHx-severe anxiety, OSA, CHF EF 25%, CABG 11/14, orthostatic hypotension,   Clinical Impression  Patient evaluated by Physical Therapy with no further acute PT needs identified. All education has been completed and the patient has no further questions.  See below for any follow-up Physial Therapy or equipment needs. PT is signing off. Thank you for this referral.     Follow Up Recommendations No PT follow up; Supervision/Assistance - 24 hour (for 1-2 days as pt had significant fluid removed and low K+ put him at risk of orthostasis)    Equipment Recommendations  None recommended by PT    Recommendations for Other Services       Precautions / Restrictions Precautions Precautions: Fall      Mobility  Bed Mobility               General bed mobility comments: pt up in bathroom; reports he sleeeps in his recliner  Transfers Overall transfer level: Modified independent Equipment used: Rolling walker (2 wheeled)             General transfer comment: safe use of RW  Ambulation/Gait Ambulation/Gait assistance: Supervision;Modified independent (Device/Increase time) Ambulation Distance (Feet): 200 Feet Assistive device: Rolling walker (2 wheeled) Gait Pattern/deviations: WFL(Within Functional Limits)     General Gait Details: pt states he feels generally weak from fluid removal (>10 lbs weight loss in 3 days)  Stairs            Wheelchair Mobility    Modified Rankin (Stroke Patients Only)       Balance Overall balance assessment: Modified Independent         Standing balance support: No upper extremity supported Standing balance-Leahy Scale: Good       Tandem Stance - Right Leg: 10 (stepped to rt to regain balance without assist)   Rhomberg - Eyes Opened: 30 Rhomberg - Eyes Closed: 25 High level balance activites: Backward walking;Direction changes;Turns;Head turns               Pertinent Vitals/Pain BP stable pre-walk (sitting) 120/50                Post-walk (sitting) 120/52 HR 70's  No dyspnea    Home Living Family/patient expects to be discharged to:: Private residence Living Arrangements: Alone Available Help at Discharge: Family;Available 24 hours/day Type of Home: House Home Access: Stairs to enter Entrance Stairs-Rails: Right   Home Layout: Laundry or work area in basement;Able to live on main level with bedroom/bathroom Home Equipment: Environmental consultant - 2 wheels;Hand held shower head;Shower seat - built in (elevated toilet) Additional Comments: Pt and dtr report that he will have 24 hour supervision/assist at discharge    Prior Function Level of Independence: Independent with assistive device(s)         Comments: uses RW when feeling weak, but generally uses no device; weekend before admission he participated in 2 church sessions     Hand Dominance   Dominant Hand: Right    Extremity/Trunk Assessment   Upper Extremity Assessment: Overall WFL for tasks assessed           Lower Extremity Assessment: Overall WFL for tasks assessed      Cervical / Trunk Assessment:  Normal  Communication   Communication: No difficulties  Cognition Arousal/Alertness: Awake/alert Behavior During Therapy: WFL for tasks assessed/performed Overall Cognitive Status: Within Functional Limits for tasks assessed                      General Comments General comments (skin integrity, edema, etc.): Pt reports HHPT came one time on discharge from hospital previously. does not feel he needs HHPT as his family will provide supervision/assist x minimum of 48 hrs    Exercises        Assessment/Plan    PT  Assessment Patent does not need any further PT services  PT Diagnosis     PT Problem List    PT Treatment Interventions     PT Goals (Current goals can be found in the Care Plan section) Acute Rehab PT Goals PT Goal Formulation: No goals set, d/c therapy    Frequency     Barriers to discharge        Co-evaluation               End of Session Equipment Utilized During Treatment: Gait belt Activity Tolerance: Patient tolerated treatment well Patient left: in chair;with call bell/phone within reach Nurse Communication: Mobility status;Other (comment) (condom cath partially came off while pt on toilet; pt remove)         Time: 8850-2774 PT Time Calculation (min): 29 min   Charges:   PT Evaluation $Initial PT Evaluation Tier I: 1 Procedure PT Treatments $Gait Training: 8-22 mins   PT G CodesJeanie Cooks Haileyann Staiger 05/09/14, 10:58 AM Pager [phone]

## 2014-04-24 NOTE — Discharge Instructions (Signed)
Heart Failure Heart failure means your heart has trouble pumping blood. This makes it hard for your body to work well. Heart failure is usually a long-term (chronic) condition. You must take good care of yourself and follow your doctor's treatment plan. HOME CARE  Take your heart medicine as told by your doctor.  Do not stop taking medicine unless your doctor tells you to.  Do not skip any dose of medicine.  Refill your medicines before they run out.  Take other medicines only as told by your doctor or pharmacist.  Stay active if told by your doctor. The elderly and people with severe heart failure should talk with a doctor about physical activity.  Eat heart healthy foods. Choose foods that are without trans fat and are low in saturated fat, cholesterol, and salt (sodium). This includes fresh or frozen fruits and vegetables, fish, lean meats, fat-free or low-fat dairy foods, whole grains, and high-fiber foods. Lentils and dried peas and beans (legumes) are also good choices.  Limit salt if told by your doctor.  Cook in a healthy way. Roast, grill, broil, bake, poach, steam, or stir-fry foods.  Limit fluids as told by your doctor.  Weigh yourself every morning. Do this after you pee (urinate) and before you eat breakfast. Write down your weight to give to your doctor.  Take your blood pressure and write it down if your doctor tell you to.  Ask your doctor how to check your pulse. Check your pulse as told.  Lose weight if told by your doctor.  Stop smoking or chewing tobacco. Do not use gum or patches that help you quit without your doctor's approval.  Schedule and go to doctor visits as told.  Nonpregnant women should have no more than 1 drink a day. Men should have no more than 2 drinks a day. Talk to your doctor about drinking alcohol.  Stop illegal drug use.  Stay current with shots (immunizations).  Manage your health conditions as told by your doctor.  Learn to manage  your stress.  Rest when you are tired.  If it is really hot outside:  Avoid intense activities.  Use air conditioning or fans, or get in a cooler place.  Avoid caffeine and alcohol.  Wear loose-fitting, lightweight, and light-colored clothing.  If it is really cold outside:  Avoid intense activities.  Layer your clothing.  Wear mittens or gloves, a hat, and a scarf when going outside.  Avoid alcohol.  Learn about heart failure and get support as needed.  Get help to maintain or improve your quality of life and your ability to care for yourself as needed. GET HELP IF:   You gain 03 lb/1.4 kg or more in 1 day or 05 lb/2.3 kg in a week.  You are more short of breath than usual.  You cannot do your normal activities.  You tire easily.  You cough more than normal, especially with activity.  You have any or more puffiness (swelling) in areas such as your hands, feet, ankles, or belly (abdomen).  You cannot sleep because it is hard to breathe.  You feel like your heart is beating fast (palpitations).  You get dizzy or lightheaded when you stand up. GET HELP RIGHT AWAY IF:   You have trouble breathing.  There is a change in mental status, such as becoming less alert or not being able to focus.  You have chest pain or discomfort.  You faint. MAKE SURE YOU:   Understand these   instructions.  Will watch your condition.  Will get help right away if you are not doing well or get worse. Document Released: 08/30/2008 Document Revised: 03/18/2013 Document Reviewed: 06/21/2012 Exit[employer] Patient Information 2014 [employer].  

## 2014-04-24 NOTE — Discharge Summary (Signed)
Advanced Heart Failure Team  Discharge Summary   Patient ID: Antonio Bernard MRN: [id number], DOB/AGE: 78-07-1933 78 y.o. Admit date: 04/22/2014 D/C date:     04/24/2014   Primary Discharge Diagnoses:  1. A/C systolic HF --ECHO 03/980 20% . Refuses ICD 2. Ischemic Cardiomyopathy 3. Pleural effusion s/p pleurex catheter. Pleurex catheter removed 04/23/14 - 4. CAD s/p CABG x2 LIMA-LAD; SVG-DIAG on 10/08/13 5. H/o orthostatic hypotension  6. Hypokalemia  7. H/O of RLE DVT - completed 6 months of [facility] Course:  Mr. Lightner is a 80 yo male with a hx of HTN,  anxiety, OSA , systolic HF due to ICM 12/9145 EF 20%, CAD s/p CABG x 2 LIMA-LAD; SVG-DIAG on 10/08/13, and RLE DVT 2014 ---> completed 6 months of xarelto.    On 10/05/2013 had late presentation with anterior MI. Underwent emergent LAD PCI followed by emergent CABG. Postoperative course was complicated by shock and cardiorenal syndrome which required lasix drip, milrinone and dopamine. His post operative course was also complicated by DVT in his R peroneal vein. Since d/c he has struggled with severe fatigue and HF. He has also had placement of Pleurex catheter for recurrent L pleural effusion.   He was seen in Clinic with low output HF symptoms.and admitted for scheduled RHC and consideration of inotropic support. [place] cath showed elevated filling pressure with normal outputs. He was diuresed with IV lasix and lost over 15 pounds with marked improvement in his symptoms and co-ox. He did not require inotropes. He was transitioned to 20 mg of torsemide daily and 12.5 mg spironolactone daily. He was instructed to hold torsemide for a weight less than 190 pounds and to take an additional 20 mg of torsemide for weight 194 or greater. Overall he diuresed 16 pounds. Renal function remained stable. He is not on beta blocker or Arb due to orthostatic hypotension.   Dr Prescott Gum evaluated pleurex catheter and due to limited output he  removed the pleurex and will re-evaluate in his office in early June. He was offered home health however he declined and preferred to continue cardiac rehab. Lengthy discussions occurred regarding medication compliance and daily weights. For now his daughter plans to assist with his medications. He will continue to be followed closely in the HF clinic with follow up May 27 at 12:20.    RHC 04/22/14  RA = 12  RV = 67/12/15  PA = 64/33 (47)  PCW = 31  Fick cardiac output/index = 5.5/2.5  Thermo CO/CI = 4.6/2.1  PVR = 4.4 WU  FA sat = 99%  PA sat = 57%, 59   CO-OX 69%  Creatinine 0.77   Discharge Weight Range: Discharge Weight 191 pounds Discharge Vitals: Blood pressure 123/54, pulse 80, temperature 98.2 F (36.8 C), temperature source Oral, resp. rate 18, height 6\' 1"  (1.854 m), weight 191 lb 9.3 oz (86.9 kg), SpO2 98.00%.  Labs: Lab Results  Component Value Date   WBC 7.6 04/22/2014   HGB 9.6* 04/22/2014   HCT 30.5* 04/22/2014   MCV 87.1 04/22/2014   PLT 505* 04/22/2014    Recent Labs Lab 04/24/14 0500 04/24/14 1319  NA 148*  --   K 2.8* 4.0  CL 109  --   CO2 30  --   BUN 15  --   CREATININE 0.77  --   CALCIUM 6.6*  --   GLUCOSE 63*  --    No results found for this basename: CHOL, HDL,  LDLCALC, TRIG   BNP (last 3 results)  Recent Labs  11/14/13 2009 12/18/13 1608 01/16/14 1243  PROBNP 18488.0* 11976.0* 7699.0*    Diagnostic Studies/Procedures   No results found.  Discharge Medications     Medication List    STOP taking these medications       potassium chloride SA 20 MEQ tablet  Commonly known as:  K-DUR,KLOR-CON      TAKE these medications       acetaminophen 325 MG tablet  Commonly known as:  TYLENOL  Take 2 tablets (650 mg total) by mouth every 6 (six) hours as needed for mild pain, fever or headache.     ALPRAZolam 0.25 MG tablet  Commonly known as:  XANAX  Take 0.25-1 mg by mouth 4 (four) times daily as needed for anxiety. Take 0.25 mg  through the day as needed and take 1mg  at bedtime.     aspirin EC 81 MG tablet  Take 81 mg by mouth daily.     BENEFIBER PO  Take 10 mLs by mouth 2 (two) times daily.     cholecalciferol 1000 UNITS tablet  Commonly known as:  VITAMIN D  Take 2,000 Units by mouth daily.     citalopram 20 MG tablet  Commonly known as:  CELEXA  Take 1 tablet (20 mg total) by mouth daily.     digoxin 0.125 MG tablet  Commonly known as:  LANOXIN  Take 1 tablet (0.125 mg total) by mouth daily.     docusate sodium 100 MG capsule  Commonly known as:  COLACE  Take 200 mg by mouth daily.     multivitamin with minerals Tabs tablet  Take 1 tablet by mouth daily.     nitroGLYCERIN 0.3 MG SL tablet  Commonly known as:  NITROSTAT  Place 1 tablet (0.3 mg total) under the tongue every 5 (five) minutes as needed for chest pain.     polyethylene glycol packet  Commonly known as:  MIRALAX / GLYCOLAX  Take 17 g by mouth daily as needed for mild constipation.     potassium chloride 10 MEQ tablet  Commonly known as:  K-DUR  Take 1 tablet (10 mEq total) by mouth as needed. Only take if you take an extra torsemide     spironolactone 12.5 mg Tabs tablet  Commonly known as:  ALDACTONE  Take 0.5 tablets (12.5 mg total) by mouth daily.     torsemide 20 MG tablet  Commonly known as:  DEMADEX  Take 1 tablet (20 mg total) by mouth daily. Hold for weight less that 190. Take an extra 20 mg torsemide if weight is 194 pounds or greater.     traMADol 50 MG tablet  Commonly known as:  ULTRAM  TAKE 1 TABLET BY MOUTH EVERY 6 HOURS DAILY AS NEEDED FOR PAIN     vitamin C 1000 MG tablet  Take 1,000 mg by mouth daily.        Disposition   The patient will be discharged in stable condition to home.     Discharge Instructions   Contraindication to ACEI at discharge    Complete by:  As directed      Diet - low sodium heart healthy    Complete by:  As directed      Heart Failure patients record your daily weight  using the same scale at the same time of day    Complete by:  As directed      Increase activity slowly  Complete by:  As directed           Follow-up Information   Follow up with Glori Bickers, MD On 04/30/2014. (at 12:20 [place])    Specialty:  Cardiology   Contact information:   [place] [address] [phone]         Duration of Discharge Encounter: Greater than 35 minutes   Signed, Conrad Oakwood NP-C  04/24/2014, 2:50 PM  Patient seen and examined with [name], NP. We discussed all aspects of the encounter. I agree with the assessment and plan as stated above. I Have edited the note to reflect my changes.   Shaune Pascal Bensimhon,MD 12:53 PM

## 2014-04-25 ENCOUNTER — Encounter (HOSPITAL_COMMUNITY): Payer: Medicare Other

## 2014-04-28 ENCOUNTER — Encounter (HOSPITAL_COMMUNITY): Payer: Medicare Other

## 2014-04-30 ENCOUNTER — Encounter (HOSPITAL_COMMUNITY): Payer: Medicare Other

## 2014-04-30 ENCOUNTER — Ambulatory Visit (HOSPITAL_COMMUNITY)
Admit: 2014-04-30 | Discharge: 2014-04-30 | Disposition: A | Discharge status: Home / Self Care | Payer: Medicare Other | Attending: Internal Medicine | Admitting: Internal Medicine

## 2014-04-30 ENCOUNTER — Other Ambulatory Visit (HOSPITAL_COMMUNITY): Payer: Self-pay

## 2014-04-30 VITALS — BP 126/64 | HR 77 | Wt 198.0 lb

## 2014-04-30 DIAGNOSIS — I5023 Acute on chronic systolic (congestive) heart failure: Secondary | ICD-10-CM

## 2014-04-30 DIAGNOSIS — Z79899 Other long term (current) drug therapy: Secondary | ICD-10-CM | POA: Insufficient documentation

## 2014-04-30 DIAGNOSIS — F419 Anxiety disorder, unspecified: Secondary | ICD-10-CM

## 2014-04-30 DIAGNOSIS — J90 Pleural effusion, not elsewhere classified: Secondary | ICD-10-CM

## 2014-04-30 DIAGNOSIS — G4733 Obstructive sleep apnea (adult) (pediatric): Secondary | ICD-10-CM | POA: Insufficient documentation

## 2014-04-30 DIAGNOSIS — Z6835 Body mass index (BMI) 35.0-35.9, adult: Secondary | ICD-10-CM | POA: Insufficient documentation

## 2014-04-30 DIAGNOSIS — Z951 Presence of aortocoronary bypass graft: Secondary | ICD-10-CM | POA: Insufficient documentation

## 2014-04-30 DIAGNOSIS — I2510 Atherosclerotic heart disease of native coronary artery without angina pectoris: Secondary | ICD-10-CM | POA: Insufficient documentation

## 2014-04-30 DIAGNOSIS — K219 Gastro-esophageal reflux disease without esophagitis: Secondary | ICD-10-CM | POA: Insufficient documentation

## 2014-04-30 DIAGNOSIS — I509 Heart failure, unspecified: Secondary | ICD-10-CM | POA: Insufficient documentation

## 2014-04-30 DIAGNOSIS — F411 Generalized anxiety disorder: Secondary | ICD-10-CM | POA: Insufficient documentation

## 2014-04-30 DIAGNOSIS — I5022 Chronic systolic (congestive) heart failure: Secondary | ICD-10-CM | POA: Insufficient documentation

## 2014-04-30 DIAGNOSIS — Z7982 Long term (current) use of aspirin: Secondary | ICD-10-CM | POA: Insufficient documentation

## 2014-04-30 DIAGNOSIS — I2589 Other forms of chronic ischemic heart disease: Secondary | ICD-10-CM | POA: Insufficient documentation

## 2014-04-30 DIAGNOSIS — I10 Essential (primary) hypertension: Secondary | ICD-10-CM | POA: Insufficient documentation

## 2014-04-30 LAB — PRO B NATRIURETIC PEPTIDE: PRO B NATRI PEPTIDE: 11855 pg/mL — AB (ref 0–450)

## 2014-04-30 LAB — BASIC METABOLIC PANEL
BUN: 29 mg/dL — ABNORMAL HIGH (ref 6–23)
CHLORIDE: 97 meq/L (ref 96–112)
CO2: 28 mEq/L (ref 19–32)
Calcium: 9.3 mg/dL (ref 8.4–10.5)
Creatinine, Ser: 1.05 mg/dL (ref 0.50–1.35)
GFR calc non Af Amer: 66 mL/min — ABNORMAL LOW (ref >90)
GFR, EST AFRICAN AMERICAN: 76 mL/min — AB (ref >90)
Glucose, Bld: 85 mg/dL (ref 70–99)
POTASSIUM: 5.2 meq/L (ref 3.7–5.3)
SODIUM: 138 meq/L (ref 137–147)

## 2014-04-30 NOTE — Progress Notes (Signed)
Patient ID: Antonio Bernard, male   DOB: 24-Dec-1932, 80 y.o.   MRN: [id number] PCP: Dr. Noah Delaine Primary Cardiologist: Dr. Ellyn Hack Surgeon: Dr. Darcey Nora  HPI: Antonio Bernard is a 80 yo male with a hx of HTN, severe anxiety, OSA , systolic HF due to ICM EF 25% 10/15/2013, S/P CABG x 2 LIMA-LAD; SVG-DIAG on 10/08/13.   On 10/05/2013 had late presentation with anterior MI. Underwent emergent LAD PCI followed by emergent CABG. Postoperative course was complicated by shock and cardiorenal syndrome which required lasix drip, milrinone and dopamine. His post operative course was also complicated by DVT in his R peroneal vein. As he improved all drips were weaned off and he was transferred to CIR on 10/24/13. He was later transferred back to acute care with volme overload and low output. Placed back on Milrinone and lasix drip. Later this was weaned off. Course also c/b orthostatic hypotension. EF 25%. Discharged home with LifeVest.  Lifevest stopped in December at his request.  Echo in 1/15 with EF 35-40%. Echo 03/27/14 EF 20-25% mild MR. RV ok. No apical clot  He underwent Pleurex catheter placement for L pleural effusion in 2/15 by Dr. Prescott Gum.   Admitted [facility] 5/19-5/21 for low output symptoms and volume overload. We anticipated need for inotropes. However RHC c/w volume overload but output ok. Diureses 16 pounds and was much improved. Pleurex catheter was also removed. D/c weight was 191 (but weighted 194 when he got home). Also started on Celexa.   RHC 04/22/14  RA = 12  RV = 67/12/15  PA = 64/33 (47)  PCW = 31  Fick cardiac output/index = 5.5/2.5  Thermo CO/CI = 4.6/2.1  PVR = 4.4 WU  FA sat = 99%  PA sat = 57%, 59   He returns for follow-up. Feels great. Breathing better. Appetite much improved. Mood improved. No edema. Weight stable 193-197. Dizziness improved. SBP 110-128.   Labs 11/15/13 K 3.9 Creatinine 1.46, digoxin 0.6 Labs 03/12/14: K+ 4.2, Creatinine 1.2 04/24/14: Cr 0.77  ROS:  All systems negative except as listed in HPI, PMH and Problem List.  Past Medical History  Diagnosis Date  . Acid reflux   . Family history of anesthesia complication     daughter had problem waking up  . Pneumonia     hx of  . Sleep apnea     no treatment for, last sleep study over 5 years ago  . Abdominal aortic aneurysm     found 06/29/12 "small"  . Headache(784.0)   . Herniated vertebral disc     thoracic area  . Cataracts, bilateral     hx of  . H/O Legionnaire's disease     about 24 years ago  . Anxiety     panic attacks   . Claustrophobia     "severe"  . Hypertension     sees  Dr. Alyson Ingles [phone]  . Obesity (BMI 35.0-39.9 without comorbidity) 10/06/2013  . Essential hypertension 10/06/2013  . Anginal pain   . Atrial fibrillation     Postoperative  . Acute renal insufficiency     Postoperative  . Anemia associated with acute blood loss     Postoperative    Current Outpatient Prescriptions  Medication Sig Dispense Refill  . acetaminophen (TYLENOL) 325 MG tablet Take 2 tablets (650 mg total) by mouth every 6 (six) hours as needed for mild pain, fever or headache.      . ALPRAZolam (XANAX) 0.25 MG tablet Take 0.25-1 mg by mouth  4 (four) times daily as needed for anxiety. Take 0.25 mg through the day as needed and take 1mg  at bedtime.      . Ascorbic Acid (VITAMIN C) 1000 MG tablet Take 1,000 mg by mouth daily.      Marland Kitchen aspirin EC 81 MG tablet Take 81 mg by mouth daily.      . cholecalciferol (VITAMIN D) 1000 UNITS tablet Take 2,000 Units by mouth daily.      . citalopram (CELEXA) 20 MG tablet Take 1 tablet (20 mg total) by mouth daily.  30 tablet  6  . digoxin (LANOXIN) 0.125 MG tablet Take 1 tablet (0.125 mg total) by mouth daily.  30 tablet  6  . docusate sodium (COLACE) 100 MG capsule Take 200 mg by mouth daily.      . Multiple Vitamin (MULTIVITAMIN WITH MINERALS) TABS tablet Take 1 tablet by mouth daily.      . nitroGLYCERIN (NITROSTAT) 0.3 MG SL tablet Place 1  tablet (0.3 mg total) under the tongue every 5 (five) minutes as needed for chest pain.  90 tablet  12  . polyethylene glycol (MIRALAX / GLYCOLAX) packet Take 17 g by mouth daily as needed for mild constipation.      . potassium chloride (K-DUR) 10 MEQ tablet Take 10 mEq by mouth daily. Only take if you take an extra torsemide      . spironolactone (ALDACTONE) 12.5 mg TABS tablet Take 0.5 tablets (12.5 mg total) by mouth daily.  30 tablet  6  . torsemide (DEMADEX) 20 MG tablet Take 1 tablet (20 mg total) by mouth daily. Hold for weight less that 190. Take an extra 20 mg torsemide if weight is 194 pounds or greater.  30 tablet  3  . traMADol (ULTRAM) 50 MG tablet TAKE 1 TABLET BY MOUTH EVERY 6 HOURS DAILY AS NEEDED FOR PAIN  30 tablet  0  . Wheat Dextrin (BENEFIBER PO) Take 10 mLs by mouth 2 (two) times daily.       No current facility-administered medications for this encounter.    Filed Vitals:   04/30/14 1235  BP: 126/64  Pulse: 77  Weight: 198 lb (89.812 kg)  SpO2: 95%   PHYSICAL EXAM: General:  Much improved. NAD HEENT: normal Neck: supple. JVP 6cm. Carotids 2+ bilaterally; no bruits. No lymphadenopathy or thryomegaly appreciated. Cor: PMI normal. Regular rate & rhythm. No rubs, gallops or murmurs Lungs: Clear   decreased R base Abdomen: soft, nontender, nondistended. No hepatosplenomegaly. No bruits or masses. Good bowel sounds. Pleurex cath ok.  Extremities: no cyanosis, clubbing, rash, no edema bilaterally.  Neuro: alert & orientedx3, cranial nerves grossly intact. Moves all 4 extremities w/o difficulty. Affect pleasant.  ASSESSMENT & PLAN: 1.Chronic Systolic Heart Failure: Echo 4/15 EF 20%. NYHA III --he is remarkably better after recent admission and diuresis. CO on RHC was adequate so no role for inotropes at this point. Will continue current regimen.  --Reinforced need for daily weights and reviewed use of sliding scale diuretics. --See back 3 weeks 2) CAD: Recent anterior  STEMI, S/P CABG x 2 10/08/13 LIMA-LAD; SVG-DIAG on 10/08/13 . No chest pain. Myalgias with atorvastatin.  He is quite reticent to try another statin. Now that he is off Xarelto will start ASA 81.  3) Anxiety/Depression : Much improved on celexa 20 mg daily.    4) RLE DVT: Post-op.  Now off Xarelto. 5) L Pleural Effusion: S/P Pleurex catheter on L - has been removed.  6) Lightheadedness/presyncope:  has h/o orthostatic hypotension.   - Refuses compression stockings.  7) back pain - will give tramdol prn  Shaune Pascal Rogue Pautler,MD 12:52 PM

## 2014-04-30 NOTE — Patient Instructions (Signed)
We have given you a prescription for Tramadol 50 mg as needed  Labs today  Your physician recommends that you schedule a follow-up appointment in: 3 weeks

## 2014-05-01 ENCOUNTER — Encounter (HOSPITAL_COMMUNITY): Payer: Medicare Other

## 2014-05-02 ENCOUNTER — Encounter (HOSPITAL_COMMUNITY): Payer: Medicare Other

## 2014-05-05 ENCOUNTER — Encounter (INDEPENDENT_AMBULATORY_CARE_PROVIDER_SITE_OTHER): Payer: Medicare Other

## 2014-05-05 ENCOUNTER — Encounter (HOSPITAL_COMMUNITY): Payer: Medicare Other

## 2014-05-05 DIAGNOSIS — D381 Neoplasm of uncertain behavior of trachea, bronchus and lung: Secondary | ICD-10-CM

## 2014-05-07 ENCOUNTER — Encounter (HOSPITAL_COMMUNITY)
Admission: RE | Admit: 2014-05-07 | Discharge: 2014-05-07 | Disposition: A | Discharge status: Home / Self Care | Payer: Medicare Other | Source: Ambulatory Visit | Attending: Internal Medicine | Admitting: Internal Medicine

## 2014-05-07 DIAGNOSIS — I219 Acute myocardial infarction, unspecified: Secondary | ICD-10-CM | POA: Insufficient documentation

## 2014-05-07 DIAGNOSIS — I5022 Chronic systolic (congestive) heart failure: Secondary | ICD-10-CM | POA: Insufficient documentation

## 2014-05-07 DIAGNOSIS — Z5189 Encounter for other specified aftercare: Secondary | ICD-10-CM | POA: Insufficient documentation

## 2014-05-07 DIAGNOSIS — I10 Essential (primary) hypertension: Secondary | ICD-10-CM | POA: Insufficient documentation

## 2014-05-07 DIAGNOSIS — Z9889 Other specified postprocedural states: Secondary | ICD-10-CM | POA: Insufficient documentation

## 2014-05-07 DIAGNOSIS — I509 Heart failure, unspecified: Secondary | ICD-10-CM | POA: Insufficient documentation

## 2014-05-09 ENCOUNTER — Encounter (HOSPITAL_COMMUNITY)
Admission: RE | Admit: 2014-05-09 | Discharge: 2014-05-09 | Disposition: A | Discharge status: Home / Self Care | Payer: Medicare Other | Source: Ambulatory Visit | Attending: Internal Medicine | Admitting: Internal Medicine

## 2014-05-12 ENCOUNTER — Encounter (HOSPITAL_COMMUNITY): Payer: PRIVATE HEALTH INSURANCE

## 2014-05-12 ENCOUNTER — Encounter (HOSPITAL_COMMUNITY)
Admission: RE | Admit: 2014-05-12 | Discharge: 2014-05-12 | Disposition: A | Discharge status: Home / Self Care | Payer: Medicare Other | Source: Ambulatory Visit | Attending: Internal Medicine | Admitting: Internal Medicine

## 2014-05-14 ENCOUNTER — Encounter (HOSPITAL_COMMUNITY): Payer: Medicare Other

## 2014-05-16 ENCOUNTER — Encounter (HOSPITAL_COMMUNITY): Payer: Medicare Other

## 2014-05-19 ENCOUNTER — Encounter (HOSPITAL_COMMUNITY): Payer: Medicare Other

## 2014-05-20 ENCOUNTER — Encounter (HOSPITAL_COMMUNITY): Payer: PRIVATE HEALTH INSURANCE

## 2014-05-20 NOTE — Progress Notes (Signed)
Cardiac Rehabilitation Program Outcomes Report   Orientation:  02/25/2014 Graduate Date:  NA  Discharge Date: 05/23/14 Deceased # of sessions completed: 68 DX CABG X2, DVT, CHF  Cardiologist: Bensihmon Family MD:  Aundra Millet Time:  11:00  A.  Exercise Program:  Tolerates exercise @ 3.83 METS for 15 minutes, Walk Test Results:  Pre: Pre walk Test : Rest HR 77, BP 120/62, 02 97%, RPE 9 and RPD 9, ^minute patient had to stop at 1.25 minutes, became dizzy, Rest HR 80, BP 120/72,O2 94%, RPE 9 and RPD 9 walked 250 feet at .47 mph at 1.3 METS. and Discharged  B.  Mental Health:  Good mental attitude and Health related anxiety  C.  Education/Instruction/Skills  Accurately checks own pulse.  Rest:  77  Exercise:  87, Knows THR for exercise and Uses Perceived Exertion Scale and/or Dyspnea Scale  Uses Perceived Exertion Scale and/or Dyspnea Scale  D.  Nutrition/Weight Control/Body Composition:  Adherence to prescribed nutrition program: fair    E.  Blood Lipids    No results found for this basename: CHOL, HDL, LDLCALC, LDLDIRECT, TRIG, CHOLHDL    F.  Lifestyle Changes:  Making positive lifestyle changes and Not smoking:  Quit 60 years ago  G.  Symptoms noted with exercise:  Asymptomatic  Report Completed By:  Antonio Bernard.Antonio Jentsch RN   Comments:  This is patients discharge letter. Patient did well while in rehab. He achieved a peak METS of 3.83. He came 05/23/2014, he was weak and tired but got through the session. On Wednesday we were informed by a classmate that Antonio Bernard had passed away in his sleep.

## 2014-05-21 ENCOUNTER — Encounter (HOSPITAL_COMMUNITY): Payer: Medicare Other

## 2014-05-23 ENCOUNTER — Encounter (HOSPITAL_COMMUNITY): Payer: Medicare Other

## 2014-06-04 DEATH — deceased

## 2014-06-09 ENCOUNTER — Telehealth: Payer: Self-pay | Admitting: Internal Medicine

## 2014-06-09 NOTE — Telephone Encounter (Signed)
Original d/c rec From [facility] sent Interoffice to [facility] S/Dr.Bensimhon  7.6.15/km

## 2014-06-25 ENCOUNTER — Ambulatory Visit: Payer: PRIVATE HEALTH INSURANCE | Admitting: Surgery

## 2014-11-13 ENCOUNTER — Encounter (HOSPITAL_COMMUNITY): Payer: Self-pay | Admitting: Cardiology

## 2015-07-15 IMAGING — CR DG CHEST 1V PORT
1 series · 1 of 1 positions shown · non-contrast
Comparison: 07/19/2012

CLINICAL DATA: Acute myocardial infarct.

EXAM:
PORTABLE CHEST - 1 VIEW

[AP]
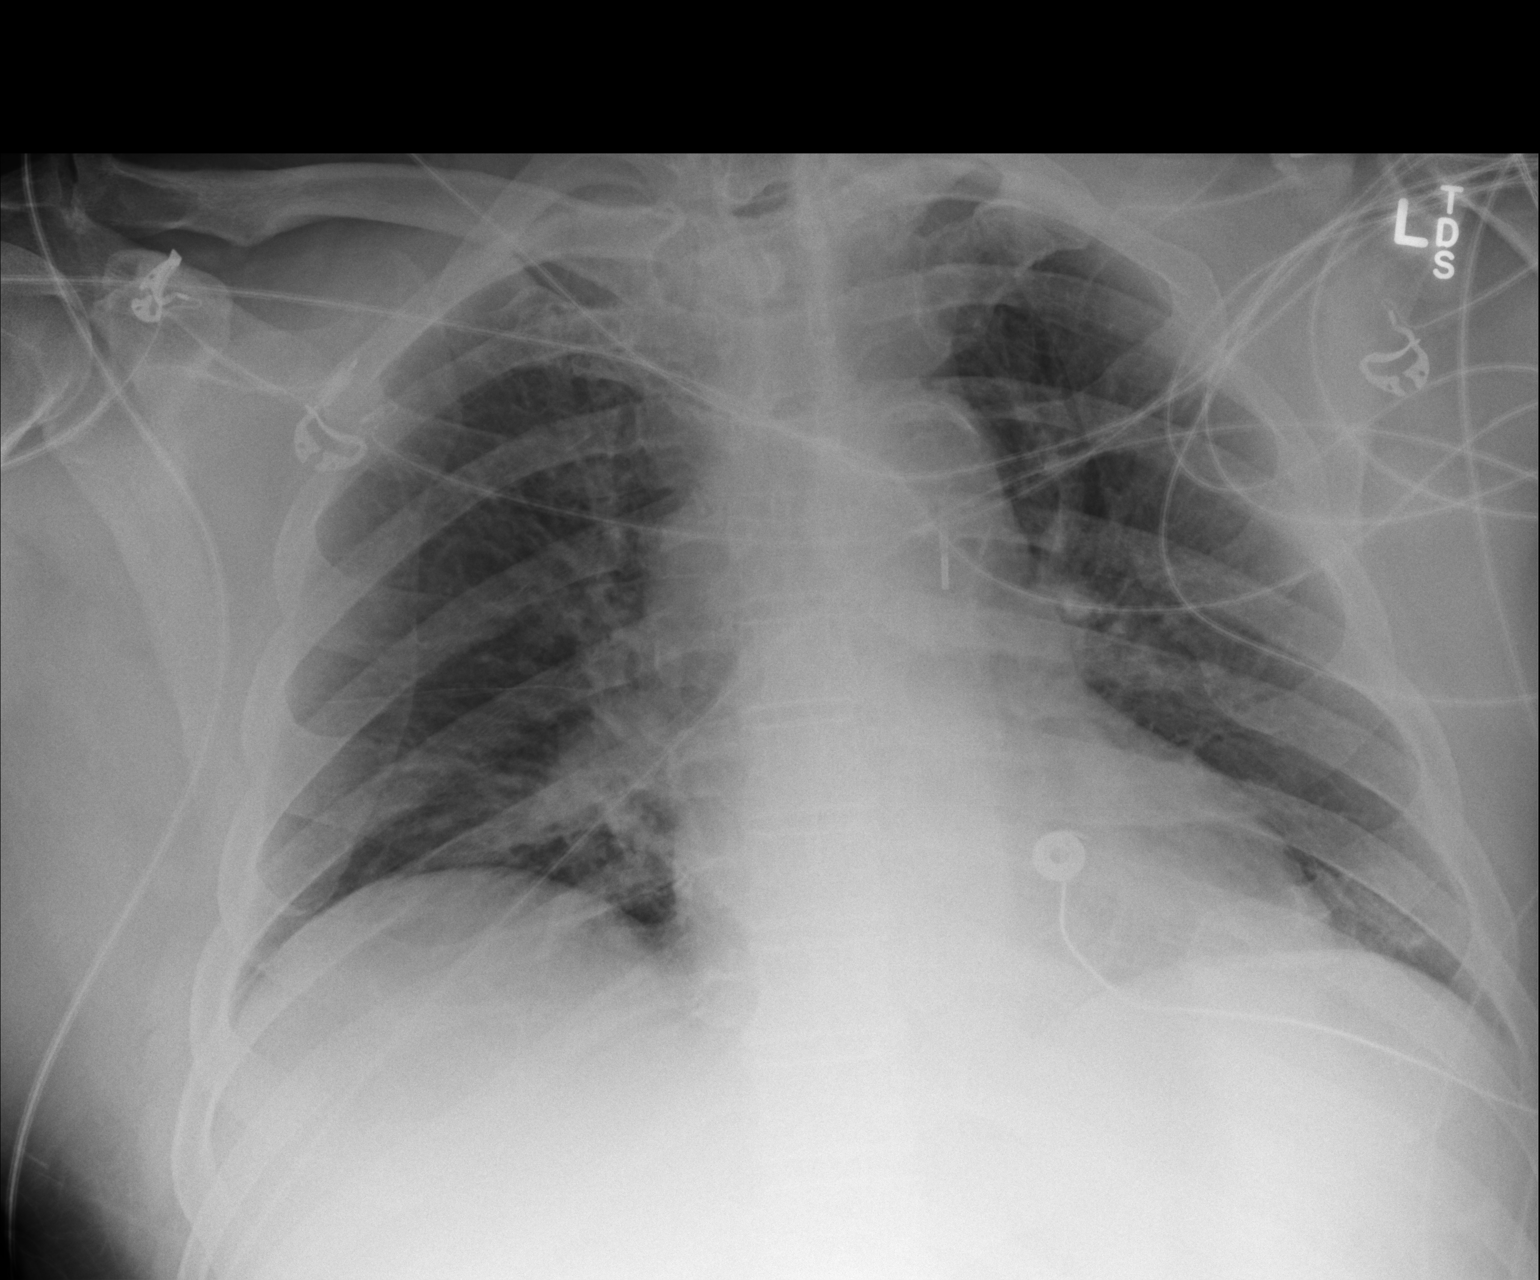

[1 of 1 positions shown; findings below may reference images not displayed]

FINDINGS: Low lung volumes are noted however both lungs remain clear. Heart
size is within normal limits. No evidence of pleural effusion.
Intra-aortic balloon pump is seen with tip just below the aortic
arch.
IMPRESSION: Intra-aortic balloon pump in appropriate position. No active lung
disease.

## 2015-07-19 IMAGING — CR DG CHEST 1V PORT
1 series · 1 of 1 positions shown · non-contrast
Comparison: 10/09/2013 and 10/08/2013

CLINICAL DATA: Coronary artery disease. Status post CABG.

EXAM:
PORTABLE CHEST - 1 VIEW

[AP]
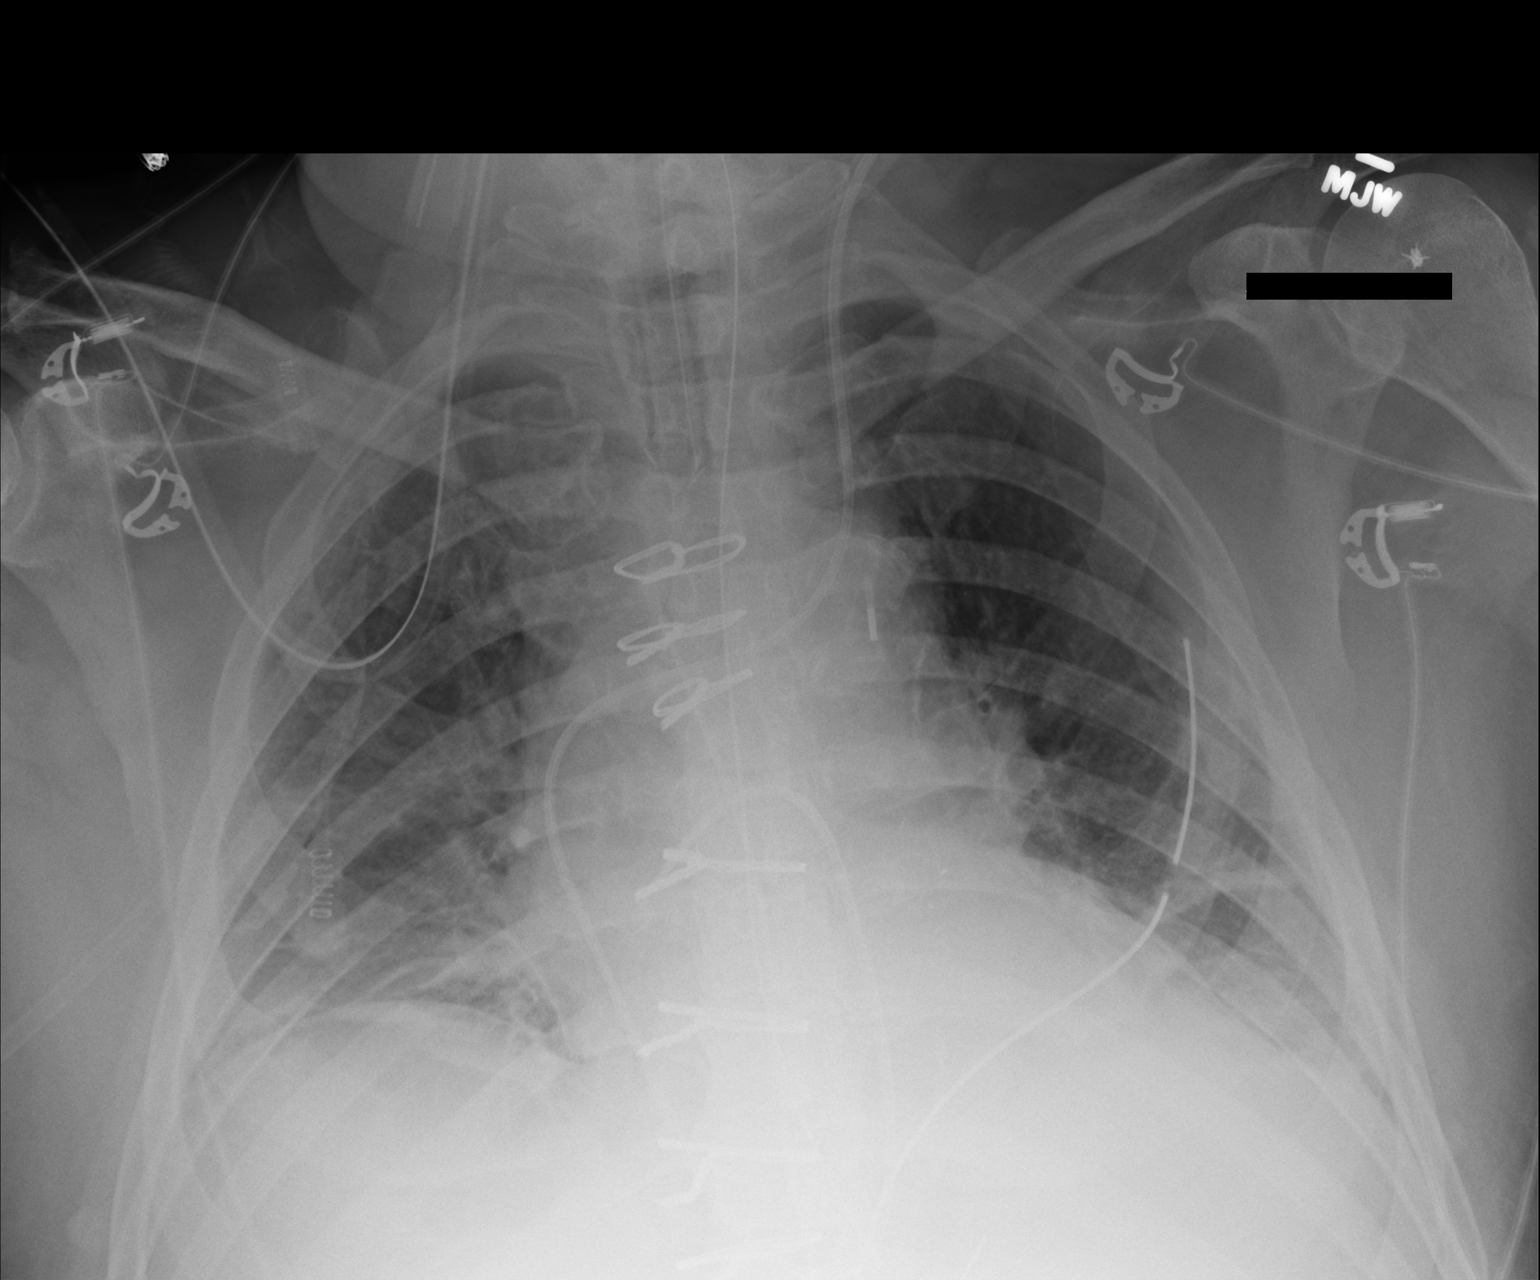

[1 of 1 positions shown; findings below may reference images not displayed]

FINDINGS: Endotracheal tube, Swan-Ganz catheter, OG tube, and chest tubes
appear unchanged. No pneumothorax.

Pulmonary vascularity and heart size are normal. Increased slight
bibasilar atelectasis.
IMPRESSION: Slight increase in the mild bibasilar atelectasis.

## 2015-07-22 IMAGING — CR DG CHEST 1V PORT
1 series · 1 of 1 positions shown · non-contrast
Comparison: 10/12/2013, 10/11/2013, 10/08/2013

CLINICAL DATA: Shortness of breath, followup pulmonary edema

EXAM:
PORTABLE CHEST - 1 VIEW

[AP]
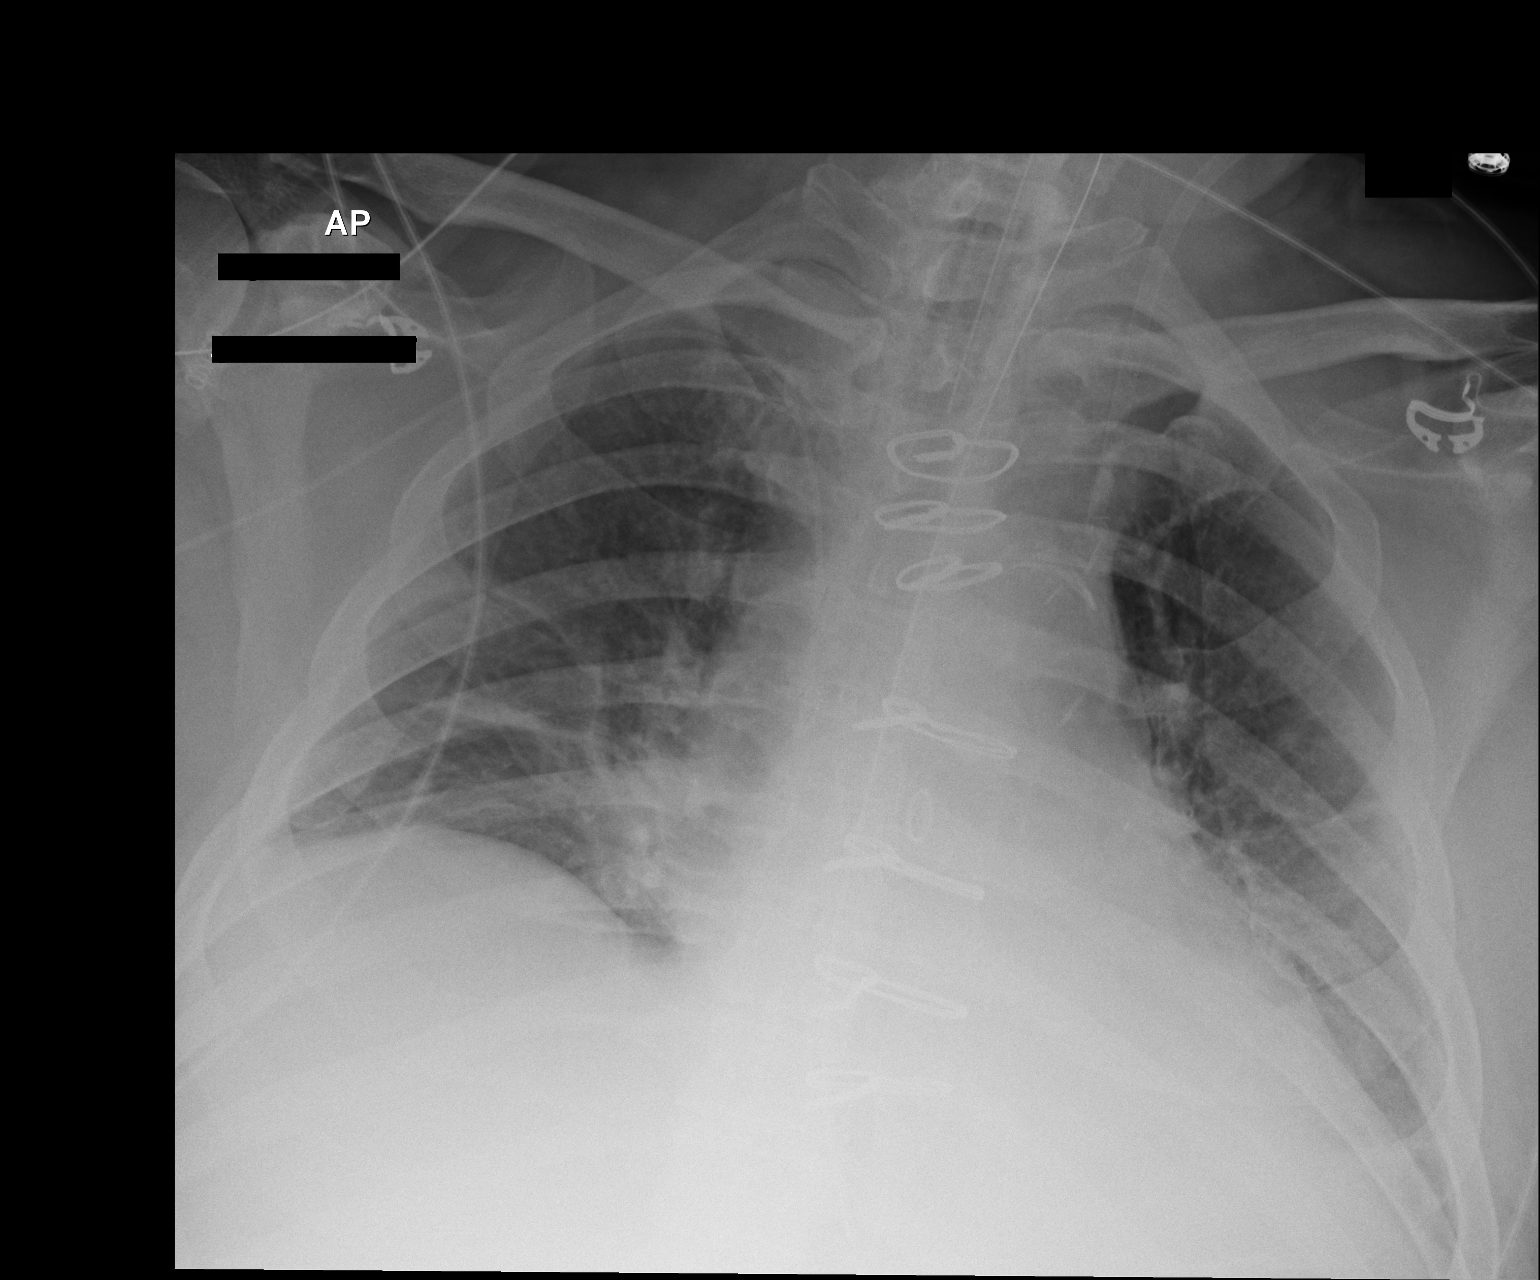

[1 of 1 positions shown; findings below may reference images not displayed]

FINDINGS: There has been interval removal of the Swan-Ganz catheter. The left
IJ sheath remains in place. The right-sided PICC line is in
satisfactory position. There is an endotracheal tube with the tip
4.5 cm above the carina. There is a nasogastric tube coursing below
the diaphragm with the tip not visualized.

There are low lung volumes. There is no pneumothorax. There is no
definite pleural effusion. The left costophrenic angle is excluded
from the field of view. There is bibasilar airspace disease likely
representing atelectasis. The heart size is mildly enlarged, but
stable. Changes secondary to CABG noted.
IMPRESSION: Bibasilar atelectasis.

Stable support apparatus.

## 2015-07-24 IMAGING — CR DG CHEST 1V PORT
1 series · 1 of 1 positions shown · non-contrast
Comparison: 10/14/2013.

CLINICAL DATA: Coronary artery bypass grafting.

EXAM:
PORTABLE CHEST - 1 VIEW

[AP]
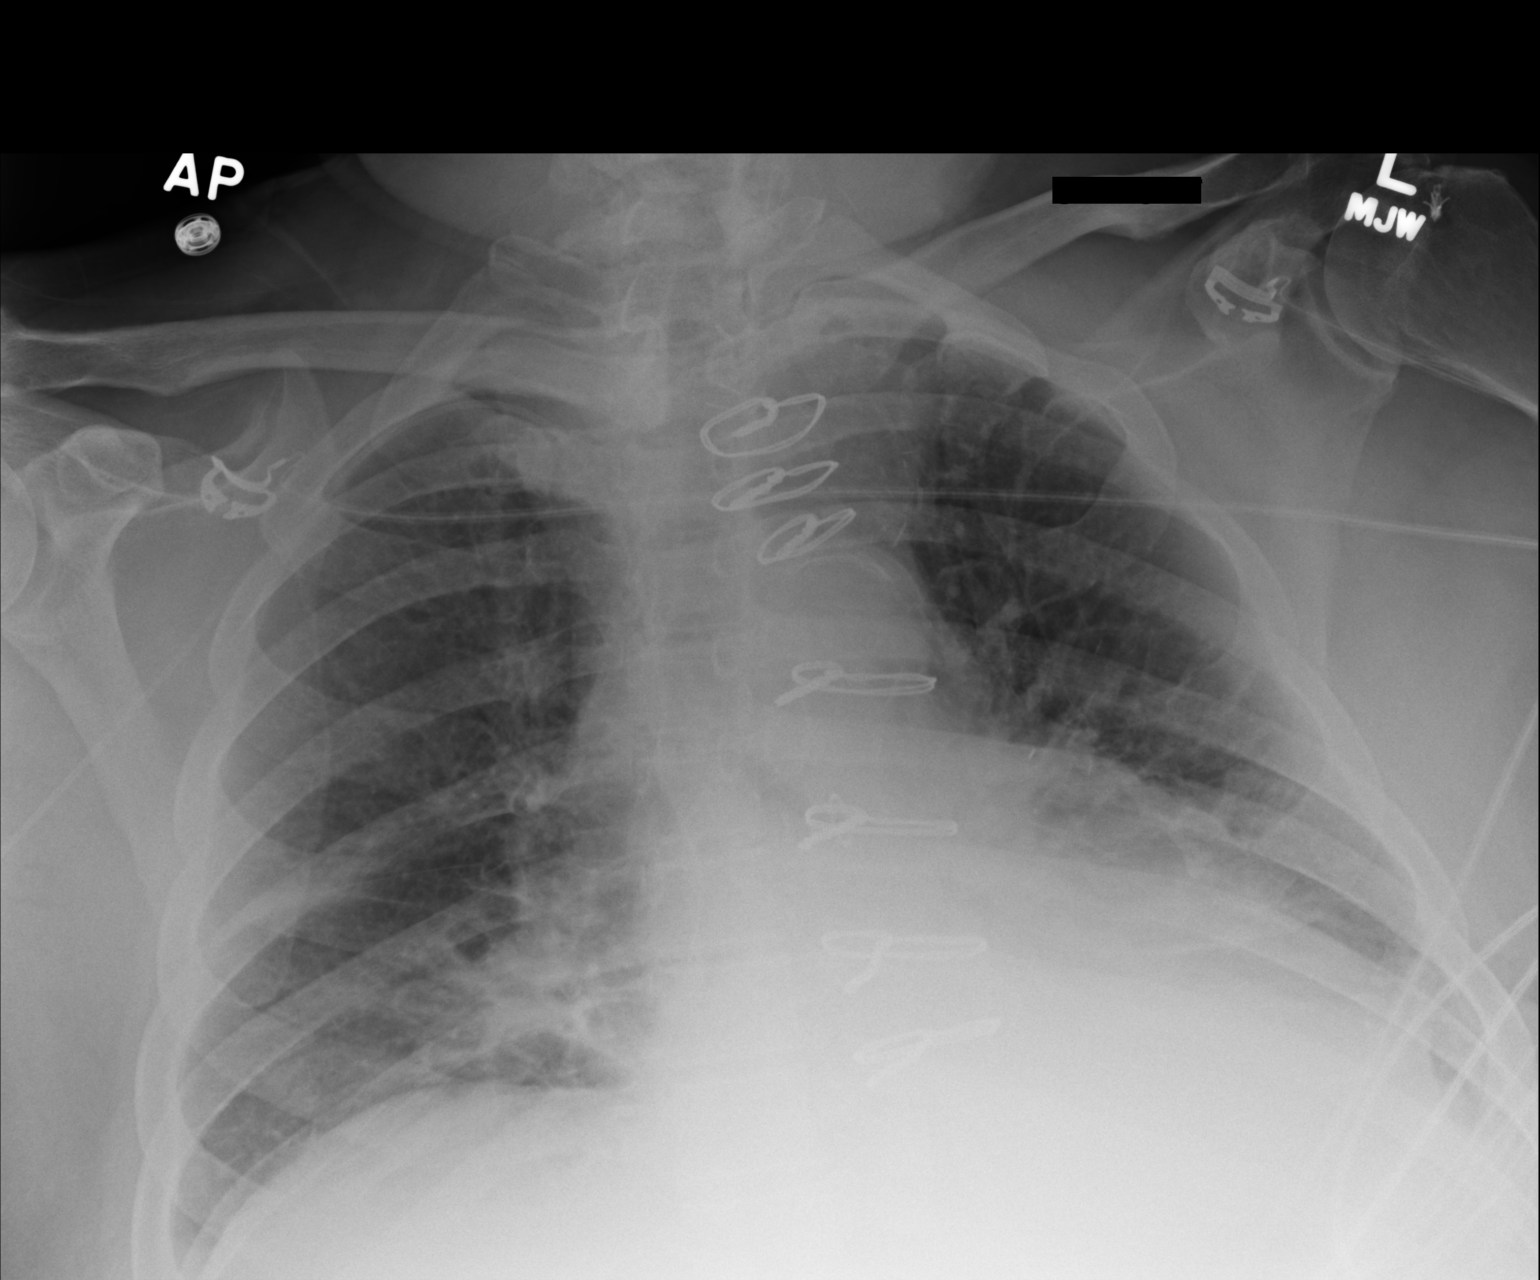

[1 of 1 positions shown; findings below may reference images not displayed]

FINDINGS: Interval removal of left IJ sheath. Right upper extremity PICC is
present with the tip unchanged. Median sternotomy/ CABG. Basilar
atelectasis. Allowing for low lung volumes, no airspace disease or
effusion. Cardiopericardial silhouette mildly enlarged.
IMPRESSION: 1. Interval removal of left IJ vascular sheath. Unchanged right
upper extremity PICC.
2. Low volume chest with basilar atelectasis.

## 2015-07-25 IMAGING — CR DG CHEST 1V PORT
1 series · 1 of 1 positions shown · non-contrast
Comparison: 10/15/2013.

CLINICAL DATA: Atelectasis.

EXAM:
PORTABLE CHEST - 1 VIEW

[AP]
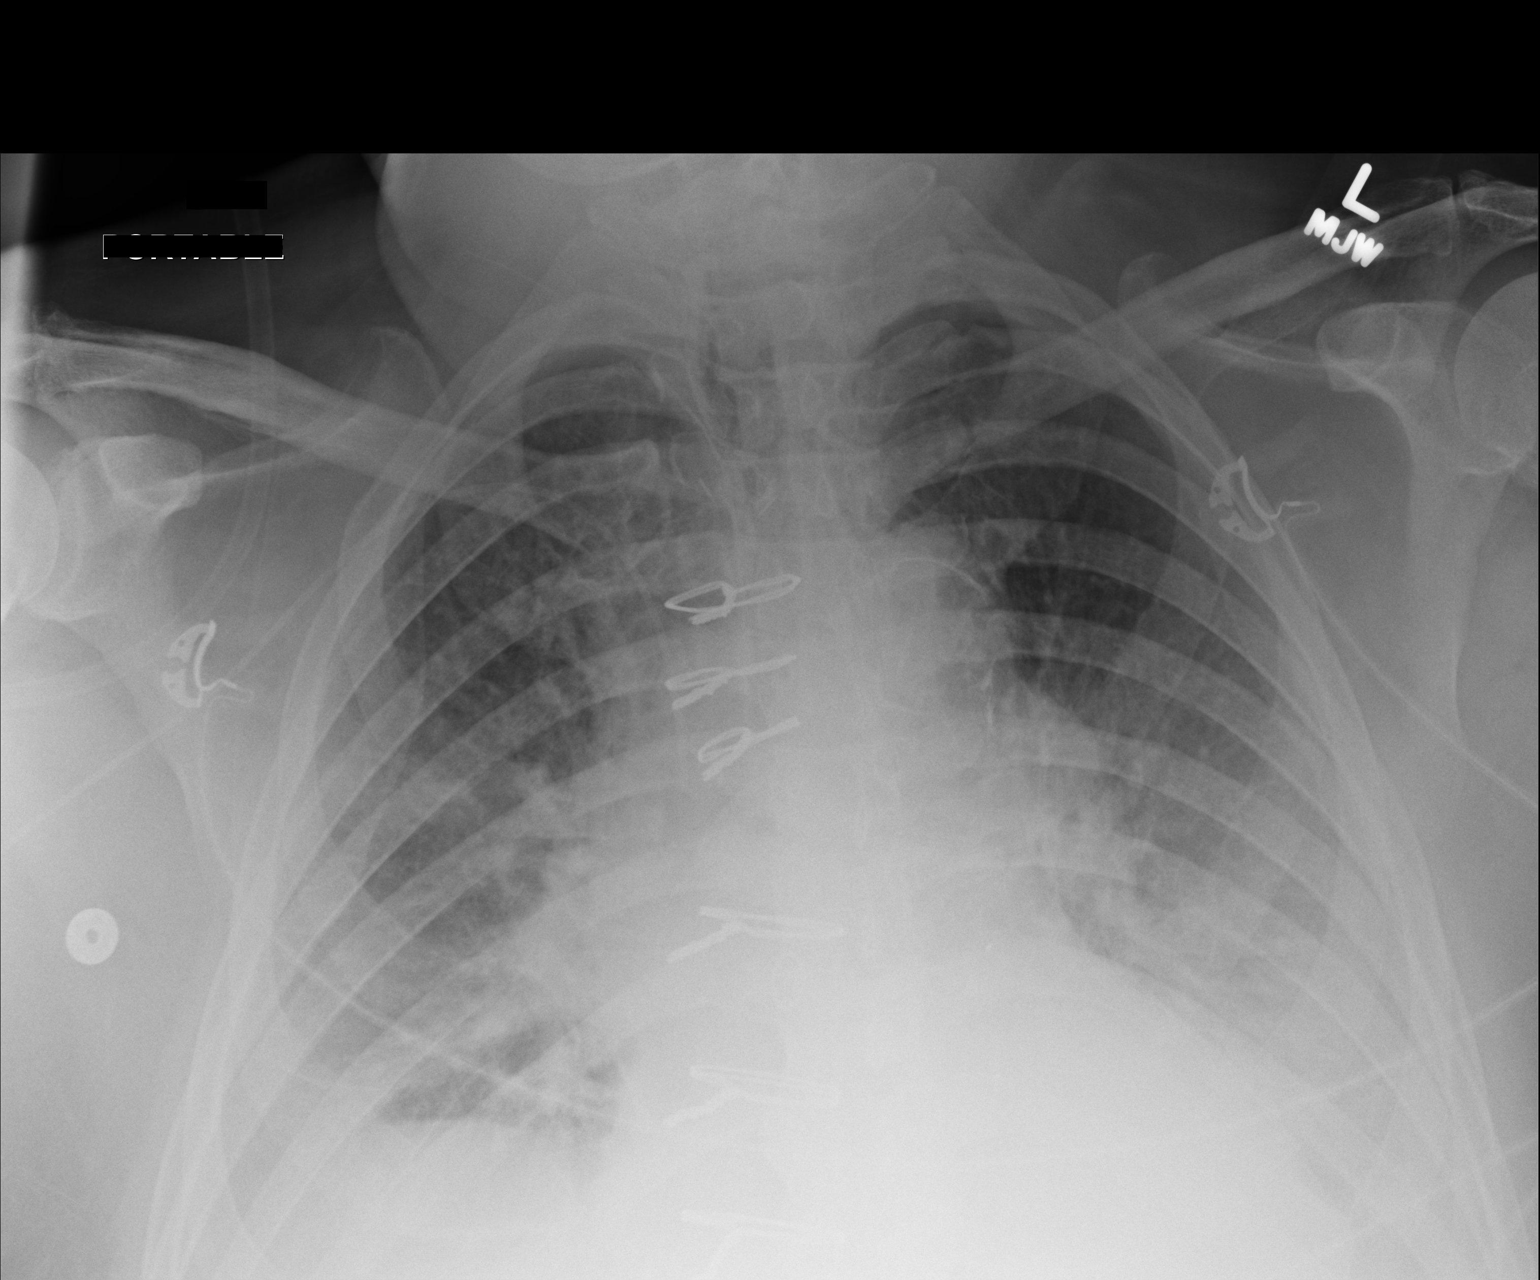

[1 of 1 positions shown; findings below may reference images not displayed]

FINDINGS: Cardiomegaly. Mild pulmonary vascular prominence. Prior CABG.
Bilateral alveolar infiltrates present. Small bilateral pleural
effusions cannot be excluded. These findings are consistent with
congestive heart failure with pulmonary edema. Persistent bibasilar
atelectasis. No pneumothorax. Stable PICC line with tip in good
anatomic position.
IMPRESSION: 1. Congestive heart failure with bilateral pulmonary edema and small
pleural effusions. Prior CABG.
2. Persistent basilar atelectasis.  Low lung volumes.
3. PICC line in stable position.
